# Patient Record
Sex: Female | Born: 1965 | Race: Black or African American | Hispanic: No | Smoking: Never smoker
Health system: Southern US, Community
[De-identification: ages and names within clinical notes are randomized; demographics above are authoritative.]

## PROBLEM LIST (undated history)

## (undated) DIAGNOSIS — I10 Essential (primary) hypertension: Secondary | ICD-10-CM

## (undated) DIAGNOSIS — R7301 Impaired fasting glucose: Secondary | ICD-10-CM

## (undated) DIAGNOSIS — Z862 Personal history of diseases of the blood and blood-forming organs and certain disorders involving the immune mechanism: Secondary | ICD-10-CM

## (undated) DIAGNOSIS — I639 Cerebral infarction, unspecified: Secondary | ICD-10-CM

## (undated) DIAGNOSIS — G4730 Sleep apnea, unspecified: Secondary | ICD-10-CM

## (undated) DIAGNOSIS — R51 Headache: Secondary | ICD-10-CM

## (undated) DIAGNOSIS — E785 Hyperlipidemia, unspecified: Secondary | ICD-10-CM

## (undated) DIAGNOSIS — F418 Other specified anxiety disorders: Secondary | ICD-10-CM

## (undated) DIAGNOSIS — K219 Gastro-esophageal reflux disease without esophagitis: Secondary | ICD-10-CM

## (undated) DIAGNOSIS — M1990 Unspecified osteoarthritis, unspecified site: Secondary | ICD-10-CM

## (undated) HISTORY — DX: Impaired fasting glucose: R73.01

## (undated) HISTORY — DX: Cerebral infarction, unspecified: I63.9

## (undated) HISTORY — DX: Hyperlipidemia, unspecified: E78.5

## (undated) HISTORY — DX: Personal history of diseases of the blood and blood-forming organs and certain disorders involving the immune mechanism: Z86.2

## (undated) HISTORY — PX: TUBAL LIGATION: SHX77

## (undated) HISTORY — DX: Gastro-esophageal reflux disease without esophagitis: K21.9

## (undated) HISTORY — PX: WISDOM TOOTH EXTRACTION: SHX21

## (undated) HISTORY — DX: Other specified anxiety disorders: F41.8

---

## 2001-11-07 ENCOUNTER — Encounter: Payer: Self-pay | Admitting: Internal Medicine

## 2001-11-07 ENCOUNTER — Ambulatory Visit (HOSPITAL_COMMUNITY): Admission: RE | Admit: 2001-11-07 | Discharge: 2001-11-07 | Payer: Self-pay | Admitting: Internal Medicine

## 2001-11-14 ENCOUNTER — Ambulatory Visit (HOSPITAL_COMMUNITY): Admission: RE | Admit: 2001-11-14 | Discharge: 2001-11-14 | Payer: Self-pay | Admitting: Internal Medicine

## 2004-08-13 ENCOUNTER — Ambulatory Visit: Payer: Self-pay | Admitting: Family Medicine

## 2004-08-14 ENCOUNTER — Ambulatory Visit (HOSPITAL_COMMUNITY): Admission: RE | Admit: 2004-08-14 | Discharge: 2004-08-14 | Payer: Self-pay | Admitting: Family Medicine

## 2004-08-27 ENCOUNTER — Ambulatory Visit: Payer: Self-pay | Admitting: *Deleted

## 2004-08-31 ENCOUNTER — Ambulatory Visit (HOSPITAL_COMMUNITY): Admission: RE | Admit: 2004-08-31 | Discharge: 2004-08-31 | Payer: Self-pay | Admitting: *Deleted

## 2004-08-31 ENCOUNTER — Ambulatory Visit: Payer: Self-pay | Admitting: *Deleted

## 2005-02-10 ENCOUNTER — Emergency Department (HOSPITAL_COMMUNITY): Admission: EM | Admit: 2005-02-10 | Discharge: 2005-02-10 | Payer: Self-pay | Admitting: Emergency Medicine

## 2005-05-04 ENCOUNTER — Emergency Department (HOSPITAL_COMMUNITY): Admission: EM | Admit: 2005-05-04 | Discharge: 2005-05-05 | Payer: Self-pay | Admitting: Emergency Medicine

## 2005-11-19 ENCOUNTER — Emergency Department (HOSPITAL_COMMUNITY): Admission: EM | Admit: 2005-11-19 | Discharge: 2005-11-19 | Payer: Self-pay | Admitting: Emergency Medicine

## 2008-04-09 ENCOUNTER — Ambulatory Visit (HOSPITAL_COMMUNITY): Admission: RE | Admit: 2008-04-09 | Discharge: 2008-04-09 | Payer: Self-pay | Admitting: Family Medicine

## 2008-06-04 ENCOUNTER — Other Ambulatory Visit: Admission: RE | Admit: 2008-06-04 | Discharge: 2008-06-04 | Payer: Self-pay | Admitting: Nurse Practitioner

## 2008-06-05 ENCOUNTER — Other Ambulatory Visit: Admission: RE | Admit: 2008-06-05 | Discharge: 2008-06-05 | Payer: Self-pay | Admitting: Unknown Physician Specialty

## 2009-01-26 ENCOUNTER — Emergency Department (HOSPITAL_COMMUNITY): Admission: EM | Admit: 2009-01-26 | Discharge: 2009-01-26 | Payer: Self-pay | Admitting: Emergency Medicine

## 2009-06-26 ENCOUNTER — Encounter: Payer: Self-pay | Admitting: Family Medicine

## 2009-06-30 ENCOUNTER — Ambulatory Visit (HOSPITAL_COMMUNITY): Admission: RE | Admit: 2009-06-30 | Discharge: 2009-06-30 | Payer: Self-pay | Admitting: Family Medicine

## 2009-08-25 ENCOUNTER — Other Ambulatory Visit: Payer: Self-pay

## 2009-08-25 ENCOUNTER — Ambulatory Visit: Payer: Self-pay | Admitting: Psychiatry

## 2009-08-25 ENCOUNTER — Inpatient Hospital Stay (HOSPITAL_COMMUNITY): Admission: RE | Admit: 2009-08-25 | Discharge: 2009-08-26 | Payer: Self-pay | Admitting: Psychiatry

## 2009-09-18 ENCOUNTER — Other Ambulatory Visit: Admission: RE | Admit: 2009-09-18 | Discharge: 2009-09-18 | Payer: Self-pay | Admitting: Obstetrics and Gynecology

## 2009-09-26 ENCOUNTER — Ambulatory Visit (HOSPITAL_COMMUNITY): Admission: RE | Admit: 2009-09-26 | Discharge: 2009-09-26 | Payer: Self-pay | Admitting: Obstetrics and Gynecology

## 2009-11-05 ENCOUNTER — Ambulatory Visit: Payer: Self-pay | Admitting: Rheumatology

## 2009-12-21 HISTORY — PX: ESOPHAGOGASTRODUODENOSCOPY: SHX1529

## 2009-12-31 ENCOUNTER — Ambulatory Visit: Payer: Self-pay | Admitting: Gastroenterology

## 2010-09-22 NOTE — Letter (Signed)
Summary: Historic Patient File  Historic Patient File   Imported By: Lind Guest 06/26/2009 09:15:38  _____________________________________________________________________  External Attachment:    Type:   Image     Comment:   External Document

## 2010-10-20 ENCOUNTER — Emergency Department (HOSPITAL_COMMUNITY): Payer: Medicare Other

## 2010-10-20 ENCOUNTER — Emergency Department (HOSPITAL_COMMUNITY)
Admission: EM | Admit: 2010-10-20 | Discharge: 2010-10-20 | Disposition: A | Discharge status: Home / Self Care | Payer: Medicare Other | Attending: Emergency Medicine | Admitting: Emergency Medicine

## 2010-10-20 DIAGNOSIS — M79609 Pain in unspecified limb: Secondary | ICD-10-CM | POA: Insufficient documentation

## 2010-10-20 DIAGNOSIS — X500XXA Overexertion from strenuous movement or load, initial encounter: Secondary | ICD-10-CM | POA: Insufficient documentation

## 2010-10-20 DIAGNOSIS — S93409A Sprain of unspecified ligament of unspecified ankle, initial encounter: Secondary | ICD-10-CM | POA: Insufficient documentation

## 2010-11-08 LAB — URINALYSIS, ROUTINE W REFLEX MICROSCOPIC
Bilirubin Urine: NEGATIVE
Glucose, UA: NEGATIVE mg/dL
Leukocytes, UA: NEGATIVE
Nitrite: NEGATIVE
Protein, ur: NEGATIVE mg/dL
Specific Gravity, Urine: 1.025 (ref 1.005–1.030)
Urobilinogen, UA: 0.2 mg/dL (ref 0.0–1.0)
pH: 6 (ref 5.0–8.0)

## 2010-11-08 LAB — GLUCOSE, CAPILLARY: Glucose-Capillary: 104 mg/dL — ABNORMAL HIGH (ref 70–99)

## 2010-11-08 LAB — CBC
HCT: 38.3 % (ref 36.0–46.0)
Hemoglobin: 12.3 g/dL (ref 12.0–15.0)
MCHC: 32.2 g/dL (ref 30.0–36.0)
MCV: 76.1 fL — ABNORMAL LOW (ref 78.0–100.0)
Platelets: 200 10*3/uL (ref 150–400)
RBC: 5.03 MIL/uL (ref 3.87–5.11)
RDW: 19.1 % — ABNORMAL HIGH (ref 11.5–15.5)
WBC: 7.4 10*3/uL (ref 4.0–10.5)

## 2010-11-08 LAB — RAPID URINE DRUG SCREEN, HOSP PERFORMED
Amphetamines: NOT DETECTED
Barbiturates: NOT DETECTED
Benzodiazepines: NOT DETECTED
Cocaine: NOT DETECTED
Opiates: NOT DETECTED
Tetrahydrocannabinol: NOT DETECTED

## 2010-11-08 LAB — BASIC METABOLIC PANEL
BUN: 13 mg/dL (ref 6–23)
CO2: 24 mEq/L (ref 19–32)
Calcium: 9.7 mg/dL (ref 8.4–10.5)
Chloride: 103 mEq/L (ref 96–112)
Creatinine, Ser: 0.97 mg/dL (ref 0.4–1.2)
GFR calc Af Amer: >60 mL/min (ref >60)
GFR calc non Af Amer: >60 mL/min (ref >60)
Glucose, Bld: 97 mg/dL (ref 70–99)
Potassium: 4 mEq/L (ref 3.5–5.1)
Sodium: 139 mEq/L (ref 135–145)

## 2010-11-08 LAB — DIFFERENTIAL
Basophils Absolute: 0 10*3/uL (ref 0.0–0.1)
Basophils Relative: 1 % (ref 0–1)
Eosinophils Absolute: 0.1 10*3/uL (ref 0.0–0.7)
Eosinophils Relative: 1 % (ref 0–5)
Lymphocytes Relative: 30 % (ref 12–46)
Lymphs Abs: 2.2 10*3/uL (ref 0.7–4.0)
Monocytes Absolute: 0.3 10*3/uL (ref 0.1–1.0)
Monocytes Relative: 5 % (ref 3–12)
Neutro Abs: 4.7 10*3/uL (ref 1.7–7.7)
Neutrophils Relative %: 64 % (ref 43–77)

## 2010-11-08 LAB — ETHANOL: Alcohol, Ethyl (B): <5 mg/dL (ref 0–10)

## 2010-11-08 LAB — URINE MICROSCOPIC-ADD ON

## 2010-11-08 LAB — PREGNANCY, URINE: Preg Test, Ur: NEGATIVE

## 2011-01-08 NOTE — Procedures (Signed)
[facility]  Patient:    ISSABELA, LESKO Visit Number: [id number] MRN: [id number]          Service Type: OUT Location: RAD Attending Physician:  Avon Gully Dictated by:   Kari Baars, M.D. Proc. Date: 11/14/01 Admit Date:  11/07/2001 Discharge Date: 11/07/2001                      Pulmonary Function Test Inter.  RESULTS: 1. Spirometry shows a mild ventilatory defect with air flow obstruction seen    at the level of the small airways. 2. Lung volumes show restrictive change and air trapping.  The restrictive    change is at approximately the same degree as the ventilatory defect seen    on spirometry. 3. DLCO is severely reduced. 4. There is no significant bronchodilator response.  Note: The patient is listed as being 62 inches, and 245 pounds, and this could produce at least part, if not all of the restrictive change seen on the lung volumes. Dictated by:   Kari Baars, M.D. Attending Physician:  Avon Gully DD:  11/14/01 TD:  11/15/01 Job: [id number]

## 2011-01-08 NOTE — Procedures (Signed)
NAME:  ZAN, TRISKA NO.:  [id number]   MEDICAL RECORD NO.:  [id number]          PATIENT TYPE:  OUT   LOCATION:  RAD                           FACILITY:  [facility]   PHYSICIAN:  [place] Bing, M.D.  DATE OF BIRTH:  1965/09/23   DATE OF PROCEDURE:  08/31/2004  DATE OF DISCHARGE:                                  ECHOCARDIOGRAM   REFERRING PHYSICIAN:  Milus Mallick. Lodema Hong, M.D. and Vida Roller, M.D.   CLINICAL DATA:  A 45 year old woman with chest pain, history of CVA and  hypertension.   M-MODE:  Aorta 2.5, left atrium 4.2, septum 1.2, posterior wall, 1.2, LV  diastole 3.8, LV systole 2.5.   1.  Technically adequate echocardiographic study.  2.  Normal left atrium, right atrium, and right ventricle.  3.  Mitral valve leaflet thickness at the upper limit of normal; minimal      mitral regurgitation.  4.  Normal tricuspid and pulmonic valve; slight tricuspid regurgitation.  5.  Mild aortic valve sclerosis; trileaflet structure with normal function.  6.  Normal internal dimension, wall thickness, regional and global function      of the left ventricle.     [place]   RR/MEDQ  D:  08/31/2004  T:  08/31/2004  Job:  [id number]   cc:   Milus Mallick. Lodema Hong, M.D.  [address]  [place], [place] [address]  Fax: [phone]   Vida Roller, M.D.  Fax: [phone]

## 2011-08-20 ENCOUNTER — Other Ambulatory Visit: Payer: Self-pay | Admitting: Obstetrics and Gynecology

## 2011-08-20 DIAGNOSIS — R928 Other abnormal and inconclusive findings on diagnostic imaging of breast: Secondary | ICD-10-CM

## 2011-09-24 HISTORY — PX: ENDOMETRIAL ABLATION: SHX621

## 2011-09-28 ENCOUNTER — Ambulatory Visit
Admission: RE | Admit: 2011-09-28 | Discharge: 2011-09-28 | Disposition: A | Discharge status: Home / Self Care | Payer: Medicare Other | Source: Ambulatory Visit | Attending: Obstetrics and Gynecology | Admitting: Obstetrics and Gynecology

## 2011-09-28 DIAGNOSIS — R928 Other abnormal and inconclusive findings on diagnostic imaging of breast: Secondary | ICD-10-CM

## 2011-10-06 ENCOUNTER — Encounter (HOSPITAL_COMMUNITY): Payer: Self-pay | Admitting: Pharmacist

## 2011-10-08 ENCOUNTER — Encounter (HOSPITAL_COMMUNITY)
Admission: RE | Admit: 2011-10-08 | Discharge: 2011-10-08 | Disposition: A | Discharge status: Home / Self Care | Payer: Medicare Other | Source: Ambulatory Visit | Attending: Obstetrics and Gynecology | Admitting: Obstetrics and Gynecology

## 2011-10-08 ENCOUNTER — Other Ambulatory Visit: Payer: Self-pay

## 2011-10-08 ENCOUNTER — Encounter (HOSPITAL_COMMUNITY): Payer: Self-pay

## 2011-10-08 ENCOUNTER — Inpatient Hospital Stay (HOSPITAL_COMMUNITY)
Admission: AD | Admit: 2011-10-08 | Discharge: 2011-10-08 | Disposition: A | Discharge status: Home / Self Care | Payer: Medicare Other | Source: Ambulatory Visit | Attending: Obstetrics and Gynecology | Admitting: Obstetrics and Gynecology

## 2011-10-08 DIAGNOSIS — R238 Other skin changes: Secondary | ICD-10-CM

## 2011-10-08 DIAGNOSIS — L988 Other specified disorders of the skin and subcutaneous tissue: Secondary | ICD-10-CM

## 2011-10-08 DIAGNOSIS — A6000 Herpesviral infection of urogenital system, unspecified: Secondary | ICD-10-CM | POA: Insufficient documentation

## 2011-10-08 HISTORY — DX: Headache: R51

## 2011-10-08 HISTORY — DX: Cerebral infarction, unspecified: I63.9

## 2011-10-08 HISTORY — DX: Essential (primary) hypertension: I10

## 2011-10-08 HISTORY — DX: Sleep apnea, unspecified: G47.30

## 2011-10-08 HISTORY — DX: Unspecified osteoarthritis, unspecified site: M19.90

## 2011-10-08 LAB — CBC
HCT: 41 % (ref 36.0–46.0)
Hemoglobin: 13 g/dL (ref 12.0–15.0)
MCH: 25.1 pg — ABNORMAL LOW (ref 26.0–34.0)
MCHC: 31.7 g/dL (ref 30.0–36.0)
MCV: 79.2 fL (ref 78.0–100.0)
Platelets: 190 10*3/uL (ref 150–400)
RBC: 5.18 MIL/uL — ABNORMAL HIGH (ref 3.87–5.11)
RDW: 16 % — ABNORMAL HIGH (ref 11.5–15.5)
WBC: 7.3 10*3/uL (ref 4.0–10.5)

## 2011-10-08 LAB — BASIC METABOLIC PANEL
BUN: 13 mg/dL (ref 6–23)
CO2: 26 mEq/L (ref 19–32)
Calcium: 9.3 mg/dL (ref 8.4–10.5)
Chloride: 104 mEq/L (ref 96–112)
Creatinine, Ser: 0.99 mg/dL (ref 0.50–1.10)
GFR calc Af Amer: 77 mL/min — ABNORMAL LOW (ref >90)
GFR calc non Af Amer: 66 mL/min — ABNORMAL LOW (ref >90)
Glucose, Bld: 169 mg/dL — ABNORMAL HIGH (ref 70–99)
Potassium: 3.6 mEq/L (ref 3.5–5.1)
Sodium: 139 mEq/L (ref 135–145)

## 2011-10-08 MED ORDER — ACYCLOVIR 400 MG PO TABS: 200.0000 mg | ORAL_TABLET | Freq: Two times a day (BID) | ORAL | Status: AC

## 2011-10-08 MED ORDER — ACYCLOVIR 400 MG PO TABS: 200.0000 mg | ORAL_TABLET | Freq: Two times a day (BID) | ORAL | Status: DC

## 2011-10-08 MED ORDER — VALACYCLOVIR HCL 1 G PO TABS: 1000.0000 mg | ORAL_TABLET | Freq: Two times a day (BID) | ORAL | Status: AC

## 2011-10-08 NOTE — Patient Instructions (Addendum)
   Your procedure is scheduled on: Wednesday, Feb 27th  Enter through the [employer] [facility] at: 6:00am Pick up the phone at the desk and dial [phone] and inform us of your arrival.  Please call this number if you have any problems the morning of surgery: [phone]  Remember: Do not eat food after midnight: Tuesday Do not drink clear liquids after: Tuesday Take these medicines the morning of surgery with a SIP OF WATER: Norvasc and Bring Albuterol Inhaler  Do not wear jewelry, make-up, or FINGER nail polish Do not wear lotions, powders, perfumes or deodorant. Do not shave 48 hours prior to surgery. Do not bring valuables to the hospital.  Patients discharged on the day of surgery will not be allowed to drive home.   Home with Daughter Gari Crown  cell [phone]   Remember to use your hibiclens as instructed.Please shower with 1/2 bottle the evening before your surgery and the other 1/2 bottle the morning of surgery.

## 2011-10-08 NOTE — Pre-Procedure Instructions (Signed)
Reviewed patient's medical history and meds with Dr Malen Gauze.  EKG on chart.

## 2011-10-08 NOTE — ED Provider Notes (Signed)
History     Chief Complaint  Patient presents with  . Rash   HPI  Pt was seen today for pre-op surgery and was found to have a rash on her right buttock that they wanted pt to be seen for evaluation.  The pt says she has had this rash for years and it always comes before her period.  She has itching prior to onset of rash.  Past Medical History  Diagnosis Date  . Hypertension   . Asthma   . Stroke 1984 , 1988    X 2 W/LEFT SIDED WEAKNESS  . Shortness of breath   . Sleep apnea     Does not use CPAP - no longer has CPAP machine  . Anemia   . Blood transfusion 1998 or [address] - unsure of # of units  . History of esophageal reflux     no meds  . Headache     OTC med PRN  . Arthritis     back pain and knee pain, no meds  . Anxiety     History - no meds  . Depression     History- no meds  . H/O dizziness     Past Surgical History  Procedure Date  . Cesarean section 1984, 1987    x 2  . Wisdom tooth extraction   . Tubal ligation     Family History  Problem Relation Age of Onset  . Anesthesia problems Neg Hx     History  Substance Use Topics  . Smoking status: Never Smoker   . Smokeless tobacco: Never Used  . Alcohol Use: Yes     Socially    Allergies: No Known Allergies  Prescriptions prior to admission  Medication Sig Dispense Refill  . albuterol (PROVENTIL HFA;VENTOLIN HFA) 108 (90 BASE) MCG/ACT inhaler Inhale 2 puffs into the lungs every 6 (six) hours as needed. Takes for shortness of breath      . amLODipine (NORVASC) 5 MG tablet Take 5 mg by mouth daily.      Marland Kitchen aspirin 81 MG tablet Take 81 mg by mouth daily.        ROS Physical Exam   Blood pressure 128/84, pulse 99, temperature 97.6 F (36.4 C), temperature source Oral, resp. rate 20, height 5\' 2"  (1.575 m), weight 259 lb (117.482 kg), last menstrual period 09/27/2011.  Physical Exam  Nursing note and vitals reviewed. Constitutional: She is oriented to person, place, and time. She  appears well-developed and well-nourished.  Eyes: Pupils are equal, round, and reactive to light.  Neck: Normal range of motion.  Respiratory: Effort normal.  GI: Soft.  Musculoskeletal: Normal range of motion.  Neurological: She is alert and oriented to person, place, and time.  Skin: Skin is warm and dry. Rash noted.       Right buttock vesicular oval  rash ~3cm with erythema around borders.  Viral culture and MRSA culture obtained    MAU Course  Procedures Viral HSV culture and MRSA cultures obtained Discussed with Dr. 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

## 2011-10-08 NOTE — Pre-Procedure Instructions (Signed)
Informed Eber Jones in Lab at [facility] of patient's history of a blood transfusion.

## 2011-10-08 NOTE — Progress Notes (Signed)
Being seen by another dr, here for pre-op.  Has a rash, comes up every time she has cycle, above left buttock/sacral area.   Was told to come get it checked out.

## 2011-10-11 LAB — MRSA CULTURE

## 2011-10-11 LAB — HERPES SIMPLEX VIRUS CULTURE: Culture: DETECTED

## 2011-10-19 MED ORDER — CEFAZOLIN SODIUM-DEXTROSE 2-3 GM-% IV SOLR
2.0000 g | INTRAVENOUS | Status: AC
  Administered 2011-10-20: 2 g via INTRAVENOUS
  Filled 2011-10-19: qty 50

## 2011-10-19 NOTE — H&P (Signed)
Gwendolyn Woodward is an 46 y.o. female S/P BTL with Mirena IUD in place to control heavy menses. Has spotting/bleeding almost every day. U/S shows IUD in proper location and changes C/W adenomyosis of uterus.  Pertinent Gynecological History: Menses: daily Bleeding: intermenstrual bleeding Contraception: tubal ligation DES exposure: unknown Blood transfusions: unknown # units Sexually transmitted diseases: trichomonas treated Previous GYN Procedures: unknown  Last mammogram: normal Date: 2012 Last pap: normal Date: 2012 OB History: G2, P2   Menstrual History: Menarche age: unknown No LMP recorded.    Past Medical History  Diagnosis Date  . Hypertension   . Asthma   . Stroke 1984 , 1988    X 2 W/LEFT SIDED WEAKNESS  . Shortness of breath   . Sleep apnea     Does not use CPAP - no longer has CPAP machine  . Anemia   . Blood transfusion 1998 or [address] - unsure of # of units  . History of esophageal reflux     no meds  . Headache     OTC med PRN  . Arthritis     back pain and knee pain, no meds  . Anxiety     History - no meds  . Depression     History- no meds  . H/O dizziness     Past Surgical History  Procedure Date  . Cesarean section 1984, 1987    x 2  . Wisdom tooth extraction   . Tubal ligation     Family History  Problem Relation Age of Onset  . Anesthesia problems Neg Hx     Social History:  reports that she has never smoked. She has never used smokeless tobacco. She reports that she drinks alcohol. She reports that she does not use illicit drugs.  Allergies: No Known Allergies  No prescriptions prior to admission    Review of Systems  Constitutional: Negative for fever and chills.  Gastrointestinal: Negative for abdominal pain.    There were no vitals taken for this visit. Physical Exam  Cardiovascular: Normal rate and regular rhythm.   Respiratory: Effort normal and breath sounds normal.  GI: Soft. Bowel sounds are normal.  There is no tenderness.    No results found for this or any previous visit (from the past 24 hour(s)).  No results found.  Assessment/Plan: 46 yo with daily spotting on Mirena IUD D/W pt H/S, D&C, EMA-risks/benefits   Shashank Kwasnik II,Gwendolyn Woodward E 10/19/2011, 5:58 PM

## 2011-10-20 ENCOUNTER — Ambulatory Visit (HOSPITAL_COMMUNITY)
Admission: RE | Admit: 2011-10-20 | Discharge: 2011-10-20 | Disposition: A | Discharge status: Home / Self Care | Payer: Medicare Other | Source: Ambulatory Visit | Attending: Obstetrics and Gynecology | Admitting: Obstetrics and Gynecology

## 2011-10-20 ENCOUNTER — Encounter (HOSPITAL_COMMUNITY): Payer: Self-pay | Admitting: Anesthesiology

## 2011-10-20 ENCOUNTER — Encounter (HOSPITAL_COMMUNITY): Payer: Self-pay | Admitting: *Deleted

## 2011-10-20 ENCOUNTER — Ambulatory Visit (HOSPITAL_COMMUNITY): Payer: Medicare Other | Admitting: Anesthesiology

## 2011-10-20 ENCOUNTER — Encounter (HOSPITAL_COMMUNITY)
Admission: RE | Disposition: A | Discharge status: Home / Self Care | Payer: Self-pay | Source: Ambulatory Visit | Attending: Obstetrics and Gynecology

## 2011-10-20 DIAGNOSIS — Z30432 Encounter for removal of intrauterine contraceptive device: Secondary | ICD-10-CM | POA: Insufficient documentation

## 2011-10-20 DIAGNOSIS — Z01818 Encounter for other preprocedural examination: Secondary | ICD-10-CM | POA: Insufficient documentation

## 2011-10-20 DIAGNOSIS — N920 Excessive and frequent menstruation with regular cycle: Secondary | ICD-10-CM | POA: Insufficient documentation

## 2011-10-20 DIAGNOSIS — Z01812 Encounter for preprocedural laboratory examination: Secondary | ICD-10-CM | POA: Insufficient documentation

## 2011-10-20 HISTORY — PX: IUD REMOVAL: SHX5392

## 2011-10-20 LAB — COMPREHENSIVE METABOLIC PANEL
ALT: 15 U/L (ref 0–35)
AST: 18 U/L (ref 0–37)
Albumin: 3.4 g/dL — ABNORMAL LOW (ref 3.5–5.2)
Alkaline Phosphatase: 97 U/L (ref 39–117)
BUN: 15 mg/dL (ref 6–23)
CO2: 28 mEq/L (ref 19–32)
Calcium: 9.2 mg/dL (ref 8.4–10.5)
Chloride: 103 mEq/L (ref 96–112)
Creatinine, Ser: 1.06 mg/dL (ref 0.50–1.10)
GFR calc Af Amer: 71 mL/min — ABNORMAL LOW (ref >90)
GFR calc non Af Amer: 61 mL/min — ABNORMAL LOW (ref >90)
Glucose, Bld: 109 mg/dL — ABNORMAL HIGH (ref 70–99)
Potassium: 4.1 mEq/L (ref 3.5–5.1)
Sodium: 139 mEq/L (ref 135–145)
Total Bilirubin: 0.3 mg/dL (ref 0.3–1.2)
Total Protein: 7.3 g/dL (ref 6.0–8.3)

## 2011-10-20 LAB — PROTIME-INR
INR: 0.95 (ref 0.00–1.49)
Prothrombin Time: 12.9 seconds (ref 11.6–15.2)

## 2011-10-20 SURGERY — DILATATION & CURETTAGE/HYSTEROSCOPY WITH NOVASURE ABLATION
Anesthesia: General | Site: Uterus | Wound class: Clean Contaminated

## 2011-10-20 MED ORDER — FENTANYL CITRATE 0.05 MG/ML IJ SOLN
INTRAMUSCULAR | Status: DC | PRN
  Administered 2011-10-20 (×2): 50 ug via INTRAVENOUS

## 2011-10-20 MED ORDER — GLYCOPYRROLATE 0.2 MG/ML IJ SOLN
INTRAMUSCULAR | Status: DC | PRN
  Administered 2011-10-20: 0.1 mg via INTRAVENOUS

## 2011-10-20 MED ORDER — PROPOFOL 10 MG/ML IV EMUL
INTRAVENOUS | Status: AC
  Filled 2011-10-20: qty 20

## 2011-10-20 MED ORDER — DEXAMETHASONE SODIUM PHOSPHATE 4 MG/ML IJ SOLN: 8.0000 mg | Freq: Once | INTRAMUSCULAR | Status: DC | PRN

## 2011-10-20 MED ORDER — FENTANYL CITRATE 0.05 MG/ML IJ SOLN
25.0000 ug | INTRAMUSCULAR | Status: DC | PRN
  Administered 2011-10-20: 50 ug via INTRAVENOUS

## 2011-10-20 MED ORDER — ONDANSETRON HCL 4 MG/2ML IJ SOLN
INTRAMUSCULAR | Status: AC
  Filled 2011-10-20: qty 2

## 2011-10-20 MED ORDER — LIDOCAINE HCL 1 % IJ SOLN
INTRAMUSCULAR | Status: DC | PRN
  Administered 2011-10-20: 20 mL

## 2011-10-20 MED ORDER — FENTANYL CITRATE 0.05 MG/ML IJ SOLN
INTRAMUSCULAR | Status: AC
  Administered 2011-10-20: 50 ug via INTRAVENOUS
  Filled 2011-10-20: qty 2

## 2011-10-20 MED ORDER — MIDAZOLAM HCL 2 MG/2ML IJ SOLN
INTRAMUSCULAR | Status: AC
  Filled 2011-10-20: qty 2

## 2011-10-20 MED ORDER — PROPOFOL 10 MG/ML IV EMUL
INTRAVENOUS | Status: DC | PRN
  Administered 2011-10-20: 200 mg via INTRAVENOUS

## 2011-10-20 MED ORDER — DEXAMETHASONE SODIUM PHOSPHATE 10 MG/ML IJ SOLN
INTRAMUSCULAR | Status: AC
  Filled 2011-10-20: qty 1

## 2011-10-20 MED ORDER — KETOROLAC TROMETHAMINE 60 MG/2ML IM SOLN
INTRAMUSCULAR | Status: AC
  Filled 2011-10-20: qty 2

## 2011-10-20 MED ORDER — LACTATED RINGERS IV SOLN
INTRAVENOUS | Status: DC
  Administered 2011-10-20: 07:00:00 via INTRAVENOUS
  Administered 2011-10-20: 125 mL/h via INTRAVENOUS

## 2011-10-20 MED ORDER — MEPERIDINE HCL 25 MG/ML IJ SOLN: 6.2500 mg | INTRAMUSCULAR | Status: DC | PRN

## 2011-10-20 MED ORDER — LIDOCAINE HCL (CARDIAC) 20 MG/ML IV SOLN
INTRAVENOUS | Status: AC
  Filled 2011-10-20: qty 5

## 2011-10-20 MED ORDER — DEXAMETHASONE SODIUM PHOSPHATE 4 MG/ML IJ SOLN
INTRAMUSCULAR | Status: DC | PRN
  Administered 2011-10-20: 10 mg via INTRAVENOUS

## 2011-10-20 MED ORDER — FENTANYL CITRATE 0.05 MG/ML IJ SOLN
25.0000 ug | INTRAMUSCULAR | Status: DC | PRN
  Administered 2011-10-20 (×3): 50 ug via INTRAVENOUS

## 2011-10-20 MED ORDER — MIDAZOLAM HCL 5 MG/5ML IJ SOLN
INTRAMUSCULAR | Status: DC | PRN
  Administered 2011-10-20: 2 mg via INTRAVENOUS

## 2011-10-20 MED ORDER — KETOROLAC TROMETHAMINE 30 MG/ML IJ SOLN
INTRAMUSCULAR | Status: DC | PRN
  Administered 2011-10-20: 30 mg via INTRAVENOUS

## 2011-10-20 MED ORDER — LIDOCAINE HCL (CARDIAC) 20 MG/ML IV SOLN
INTRAVENOUS | Status: DC | PRN
  Administered 2011-10-20: 80 mg via INTRAVENOUS

## 2011-10-20 MED ORDER — KETOROLAC TROMETHAMINE 30 MG/ML IJ SOLN: 15.0000 mg | Freq: Once | INTRAMUSCULAR | Status: DC | PRN

## 2011-10-20 MED ORDER — LACTATED RINGERS IV SOLN
INTRAVENOUS | Status: DC | PRN
  Administered 2011-10-20: 1 via INTRAVENOUS

## 2011-10-20 MED ORDER — ONDANSETRON HCL 4 MG/2ML IJ SOLN
INTRAMUSCULAR | Status: DC | PRN
  Administered 2011-10-20: 4 mg via INTRAVENOUS

## 2011-10-20 MED ORDER — GLYCOPYRROLATE 0.2 MG/ML IJ SOLN
INTRAMUSCULAR | Status: AC
  Filled 2011-10-20: qty 1

## 2011-10-20 MED ORDER — KETOROLAC TROMETHAMINE 60 MG/2ML IM SOLN
INTRAMUSCULAR | Status: DC | PRN
  Administered 2011-10-20: 30 mg via INTRAMUSCULAR

## 2011-10-20 MED ORDER — ACETAMINOPHEN 160 MG/5ML PO SOLN
1000.0000 mg | Freq: Once | ORAL | Status: AC
  Administered 2011-10-20: 1000 mg via ORAL
  Filled 2011-10-20: qty 40.6

## 2011-10-20 MED ORDER — FENTANYL CITRATE 0.05 MG/ML IJ SOLN
INTRAMUSCULAR | Status: AC
  Filled 2011-10-20: qty 2

## 2011-10-20 SURGICAL SUPPLY — 16 items
ABLATOR ENDOMETRIAL BIPOLAR (ABLATOR) ×2 IMPLANT
CATH ROBINSON RED A/P 16FR (CATHETERS) ×2 IMPLANT
CLOTH BEACON ORANGE TIMEOUT ST (SAFETY) ×2 IMPLANT
CONTAINER PREFILL 10% NBF 60ML (FORM) ×4 IMPLANT
DRESSING TELFA 8X3 (GAUZE/BANDAGES/DRESSINGS) ×2 IMPLANT
GLOVE BIO SURGEON STRL SZ8 (GLOVE) ×4 IMPLANT
GOWN PREVENTION PLUS LG XLONG (DISPOSABLE) ×2 IMPLANT
GOWN STRL REIN XL XLG (GOWN DISPOSABLE) ×3 IMPLANT
NDL SPNL 22GX3.5 QUINCKE BK (NEEDLE) ×1 IMPLANT
NEEDLE SPNL 22GX3.5 QUINCKE BK (NEEDLE) ×2 IMPLANT
PACK HYSTEROSCOPY LF (CUSTOM PROCEDURE TRAY) ×2 IMPLANT
PACK VAGINAL MINOR WOMEN LF (CUSTOM PROCEDURE TRAY) ×2 IMPLANT
PAD PREP 24X48 CUFFED NSTRL (MISCELLANEOUS) ×2 IMPLANT
SYR CONTROL 10ML LL (SYRINGE) ×2 IMPLANT
TOWEL OR 17X24 6PK STRL BLUE (TOWEL DISPOSABLE) ×4 IMPLANT
WATER STERILE IRR 1000ML POUR (IV SOLUTION) ×2 IMPLANT

## 2011-10-20 NOTE — Anesthesia Procedure Notes (Signed)
Procedure Name: LMA Insertion Date/Time: 10/20/2011 7:38 AM Performed by: Karleen Dolphin Pre-anesthesia Checklist: Patient identified, Patient being monitored, Emergency Drugs available, Timeout performed and Suction available Patient Re-evaluated:Patient Re-evaluated prior to inductionOxygen Delivery Method: Circle system utilized Preoxygenation: Pre-oxygenation with 100% oxygen Intubation Type: IV induction Ventilation: Mask ventilation with difficulty LMA: LMA with gastric port inserted LMA Size: 4.0 Number of attempts: 1 Airway Equipment and Method: Patient positioned with wedge pillow Placement Confirmation: positive ETCO2 and breath sounds checked- equal and bilateral Tube secured with: Tape Dental Injury: Teeth and Oropharynx as per pre-operative assessment  Difficulty Due To: Difficulty was anticipated

## 2011-10-20 NOTE — Anesthesia Preprocedure Evaluation (Signed)
Anesthesia Evaluation    Airway Mallampati: III TM Distance: >3 FB   Mouth opening: Limited Mouth Opening  Dental   Pulmonary asthma , sleep apnea ,    Pulmonary exam normal       Cardiovascular hypertension, Pt. on medications     Neuro/Psych PSYCHIATRIC DISORDERS Anxiety Depression CVA    GI/Hepatic negative GI ROS, Neg liver ROS,   Endo/Other  Morbid obesity  Renal/GU negative Renal ROS  Genitourinary negative   Musculoskeletal negative musculoskeletal ROS (+)   Abdominal (+) obese,   Peds negative pediatric ROS (+)  Hematology negative hematology ROS (+)   Anesthesia Other Findings   Reproductive/Obstetrics negative OB ROS                           Anesthesia Physical Anesthesia Plan  ASA: III  Anesthesia Plan: General   Post-op Pain Management:    Induction: Intravenous  Airway Management Planned: LMA  Additional Equipment:   Intra-op Plan:   Post-operative Plan:   Informed Consent: I have reviewed the patients History and Physical, chart, labs and discussed the procedure including the risks, benefits and alternatives for the proposed anesthesia with the patient or authorized representative who has indicated his/her understanding and acceptance.     Plan Discussed with: CRNA, Anesthesiologist and Surgeon  Anesthesia Plan Comments: (1. GERD not active this morning 2. Does not use CPAP for OSA  3. Will use inhaler prior to OR)        Anesthesia Quick Evaluation

## 2011-10-20 NOTE — Brief Op Note (Signed)
10/20/2011  8:17 AM  PATIENT:  Gwendolyn Woodward  46 y.o. femalewith heavy menses   PRE-OPERATIVE DIAGNOSIS:  Menorrhagia  POST-OPERATIVE DIAGNOSIS:  Menorrhagia  PROCEDURE:  Procedure(s) (LRB): DILATATION & CURETTAGE/HYSTEROSCOPY WITH NOVASURE ABLATION (N/A) INTRAUTERINE DEVICE (IUD) REMOVAL (N/A)  SURGEON:  Surgeon(s) and Role:    * Leslie Andrea, MD - Primary  PHYSICIAN ASSISTANT:   ASSISTANTS: none   ANESTHESIA:   general  EBL:  Total I/O In: 900 [I.V.:900] Out: -   BLOOD ADMINISTERED:none  DRAINS: none   LOCAL MEDICATIONS USED:  LIDOCAINE   SPECIMEN:  Source of Specimen:  endometrial currettings  DISPOSITION OF SPECIMEN:  PATHOLOGY  COUNTS:  YES  TOURNIQUET:  * No tourniquets in log *  DICTATION: .Other Dictation: Dictation Number [id number]  PLAN OF CARE: Discharge to home after PACU  PATIENT DISPOSITION:  PACU - hemodynamically stable.   Delay start of Pharmacological VTE agent (>24hrs) due to surgical blood loss or risk of bleeding: not applicable

## 2011-10-20 NOTE — Transfer of Care (Signed)
Immediate Anesthesia Transfer of Care Note  Patient: Gwendolyn Woodward  Procedure(s) Performed: Procedure(s) (LRB): DILATATION & CURETTAGE/HYSTEROSCOPY WITH NOVASURE ABLATION (N/A) INTRAUTERINE DEVICE (IUD) REMOVAL (N/A)  Patient Location: PACU  Anesthesia Type: General  Level of Consciousness: awake, alert  and oriented  Airway & Oxygen Therapy: Patient Spontanous Breathing and Patient connected to nasal cannula oxygen  Post-op Assessment: Report given to PACU RN and Post -op Vital signs reviewed and stable  Post vital signs: Reviewed and stable  Complications: No apparent anesthesia complications

## 2011-10-20 NOTE — Anesthesia Postprocedure Evaluation (Signed)
  Anesthesia Post-op Note  Patient: Gwendolyn Woodward  Procedure(s) Performed: Procedure(s) (LRB): DILATATION & CURETTAGE/HYSTEROSCOPY WITH NOVASURE ABLATION (N/A) INTRAUTERINE DEVICE (IUD) REMOVAL (N/A)  Patient is awake and responsive. Pain and nausea are reasonably well controlled. Vital signs are stable and clinically acceptable. Oxygen saturation is clinically acceptable. There are no apparent anesthetic complications at this time. Patient is ready for discharge.

## 2011-10-20 NOTE — Progress Notes (Signed)
No changes to H&P per pt history Reviewed procedure, risks/benefits All questions answered

## 2011-10-20 NOTE — Op Note (Signed)
NAME:  Gwendolyn Woodward, BENCH NO.:  [id number]  MEDICAL RECORD NO.:  [id number]  LOCATION:  [facility]                          FACILITY:  [facility]  PHYSICIAN:  Guy Sandifer. Henderson Cloud, M.D. DATE OF BIRTH:  Mar 02, 1966  DATE OF PROCEDURE:  10/20/2011 DATE OF DISCHARGE:                              OPERATIVE REPORT   PREOPERATIVE DIAGNOSIS:  Menorrhagia.  POSTOPERATIVE DIAGNOSIS:  Menorrhagia.  PROCEDURES: 1. Removal of intrauterine device. 2. Hysteroscopy. 3. Dilatation and curettage. 4. NovaSure endometrial ablation.  SURGEON:  Guy Sandifer. Henderson Cloud, MD.  ANESTHESIA:  General with LMA.  ANESTHESIOLOGIST:  Belva Agee, MD.  ESTIMATE BLOOD LOSS:  Drops.  SPECIMEN:  Endometrial curettings to Pathology.  I AND O'S/DISTENDING MEDIA:  50 mL deficit.  INDICATIONS AND CONSENT: This patient is a 46 year old lady status post tubal ligation, who has had a Mirena IUD in place for 1-2 years.  She has basically daily spotting.  Ultrasound is consistent with adenomyosis with the IUD in the proper location.  After discussion of options, she is being admitted for removal of IUD, hysteroscopy, D and C, and NovaSure endometrial ablation.  Risks and benefits of the endometrial ablation including success and failure rates, recurrence or persistent abnormal bleeding or pelvic pain have been reviewed.  Risks and complications of the surgery have been reviewed as well including, but not limited to infection, uterine perforation, organ damage, bleeding requiring transfusion of blood products with HIV and hepatitis acquisition, DVT, PE, pneumonia, pelvic pain, painful intercourse, laparotomy, laparoscopy.  All questions have been answered and consent is signed on the chart.  FINDINGS:  Both fallopian tube, ostia identified and no abnormal structures were noted.  PROCEDURE IN DETAIL:  The patient was taken to the operating room where she was identified and placed in dorsal supine position and  general anesthesia was induced via LMA.  She was placed in dorsal lithotomy position.  Time-out was undertaken.  She was prepped, bladder straight catheterized, and she is draped in a sterile fashion.  Bivalve speculum was placed in the vagina.  Anterior cervical lip was injected with 1% Xylocaine and grasped with single-tooth tenaculum.  Paracervical block was placed at 2, 4, 5, 7, 8, and 10 o'clock positions with approximately 20 mL of the same solution.  IUD was removed intact.  Uterus sounds to 8 cm and the endocervix sounds to 4 cm.  Cervix was gently progressively dilated.  Diagnostic hysteroscope was placed in the endocervical canal and advanced under direct visualization using distending media.  The above findings were noted.  Curettage was carried out and reinspection with the hysteroscope again reveals no abnormal structures.  The NovaSure device was placed.  Cavity test was passed on the first attempt and ablation carried out for 2 minutes.  The device was removed intact. Reinspection with the hysteroscope reveals good cautery effect throughout the cavity with no evidence of perforation.  Instruments were removed.  All counts correct.  The patient was awakened and taken to the recovery room in stable condition.     Guy Sandifer Henderson Cloud, M.D.     JET/MEDQ  D:  10/20/2011  T:  10/20/2011  Job:  [id number]

## 2011-11-15 ENCOUNTER — Encounter: Payer: Self-pay | Admitting: Family Medicine

## 2011-11-15 ENCOUNTER — Ambulatory Visit (INDEPENDENT_AMBULATORY_CARE_PROVIDER_SITE_OTHER): Payer: Medicare Other | Admitting: Family Medicine

## 2011-11-15 VITALS — BP 132/84 | HR 87 | Resp 18 | Ht 62.5 in | Wt 255.0 lb

## 2011-11-15 DIAGNOSIS — I10 Essential (primary) hypertension: Secondary | ICD-10-CM

## 2011-11-15 DIAGNOSIS — R0789 Other chest pain: Secondary | ICD-10-CM

## 2011-11-15 DIAGNOSIS — R7309 Other abnormal glucose: Secondary | ICD-10-CM

## 2011-11-15 DIAGNOSIS — E669 Obesity, unspecified: Secondary | ICD-10-CM | POA: Insufficient documentation

## 2011-11-15 NOTE — Patient Instructions (Signed)
Get the blood work done- do not eat after midnight - at least 2-3 days before your visit We will discuss at our follow-up appointment Continue your blood pressure medication  F/U in 4 weeks

## 2011-11-15 NOTE — Progress Notes (Signed)
  Subjective:    Patient ID: Gwendolyn Woodward, female    DOB: 1966-05-14, 46 y.o.   MRN: [id number]  HPI Pt here to establish care, previous PCP Dr.Fanta Medication and history reviewed  HTN- taking norvasc without difficulty Asthma- uses proventil as needed, non smoker Side pain- she has recurrent pain in her ride side and well as chest pains on and off. She had and Echo in 2006 which was normal.  H/O CVA- CVAin 1984 and a mini stroke since then but pt can not recall the specific time.   Depression/anxiety- she has hsitory of both, was being seen [facility] but was verbally aggressive with them therefore dismissed, she was last seen at mental health in [place] but has not been there in a while. She has anger issues, therefore keeps to herself and stays home most of the day.  Does not exercise but has equipment at home   Review of Systems  GEN- denies fatigue, fever, weight loss,weakness, recent illness HEENT- denies eye drainage, change in vision, nasal discharge, CVS- +chest pain, palpitations RESP- denies SOB, cough, wheeze ABD- denies N/V, change in stools, abd pain GU- denies dysuria, hematuria, dribbling, incontinence MSK- +joint pain, muscle aches, injury Neuro- denies headache, dizziness, syncope, seizure activity       Objective:   Physical Exam GEN- NAD, alert and oriented x3, obese HEENT- PERRL, EOMI, non injected sclera, pink conjunctiva, MMM, oropharynx clear Neck- Supple, no thyromegaly, no bruit CVS- RRR, no murmur RESP-CTAB ABD-NABS,soft, NT,ND EXT- No edema Pulses- Radial, DP- 2+  EKG- NSR      Assessment & Plan:

## 2011-11-16 NOTE — Assessment & Plan Note (Signed)
Her episodes associated with side pain are very atypical and they have been present for some time now. EKG reassuring labs to be done. Obtain PCP records

## 2011-11-16 NOTE — Assessment & Plan Note (Signed)
Discussed importance of walking and exercise, pt has equipment at home

## 2011-11-16 NOTE — Assessment & Plan Note (Signed)
Obtain A1c.  

## 2011-11-16 NOTE — Assessment & Plan Note (Signed)
Continue current meds, obtain labs

## 2011-11-23 ENCOUNTER — Encounter: Payer: Self-pay | Admitting: Family Medicine

## 2011-12-04 LAB — LIPID PANEL
Cholesterol: 203 mg/dL — ABNORMAL HIGH (ref 0–200)
HDL: 58 mg/dL (ref >39)
LDL Cholesterol: 124 mg/dL — ABNORMAL HIGH (ref 0–99)
Total CHOL/HDL Ratio: 3.5 Ratio
Triglycerides: 106 mg/dL (ref <150)
VLDL: 21 mg/dL (ref 0–40)

## 2011-12-04 LAB — HEMOGLOBIN A1C
Hgb A1c MFr Bld: 6.1 % — ABNORMAL HIGH (ref <5.7)
Mean Plasma Glucose: 128 mg/dL — ABNORMAL HIGH (ref <117)

## 2011-12-13 ENCOUNTER — Ambulatory Visit (INDEPENDENT_AMBULATORY_CARE_PROVIDER_SITE_OTHER): Payer: Medicare Other | Admitting: Family Medicine

## 2011-12-13 ENCOUNTER — Encounter: Payer: Self-pay | Admitting: Family Medicine

## 2011-12-13 VITALS — BP 138/80 | HR 84 | Resp 18 | Ht 62.5 in | Wt 258.1 lb

## 2011-12-13 DIAGNOSIS — I10 Essential (primary) hypertension: Secondary | ICD-10-CM

## 2011-12-13 DIAGNOSIS — R0789 Other chest pain: Secondary | ICD-10-CM

## 2011-12-13 DIAGNOSIS — R1310 Dysphagia, unspecified: Secondary | ICD-10-CM

## 2011-12-13 DIAGNOSIS — E785 Hyperlipidemia, unspecified: Secondary | ICD-10-CM

## 2011-12-13 DIAGNOSIS — R7309 Other abnormal glucose: Secondary | ICD-10-CM

## 2011-12-13 DIAGNOSIS — E669 Obesity, unspecified: Secondary | ICD-10-CM

## 2011-12-13 DIAGNOSIS — R7303 Prediabetes: Secondary | ICD-10-CM

## 2011-12-13 NOTE — Assessment & Plan Note (Signed)
She has recurrent dysphagia will obtain records from [facility] and refer to GI for work-up

## 2011-12-13 NOTE — Assessment & Plan Note (Signed)
Blood pressure at goal, no change to meds 

## 2011-12-13 NOTE — Assessment & Plan Note (Signed)
She continues to have chest pain, with her GI symptoms I wonder if this is more GI/Reflux related. She has this history of CVA as well as htn and glucose intolerance therefore will send for stress testing.

## 2011-12-13 NOTE — Progress Notes (Signed)
  Subjective:    Patient ID: Gwendolyn Woodward, female    DOB: 21-Jun-1966, 46 y.o.   MRN: [id number]  HPI Patient here to followup blood pressure. As well as lab review. She continues to have sharp chest pains that come and go mostly occur at rest however some with exertion. She denies heartburn symptoms. In the past she has had difficulty swallowing and this has returned. She has had a procedure done in [facility] which sounds like dilatation of the esophagus approximately one year ago.   Review of Systems    GEN- denies fatigue, fever, weight loss,weakness, recent illness HEENT- denies eye drainage, change in vision, nasal discharge, CVS- +chest pain, palpitations RESP- denies SOB, cough, wheeze ABD- denies N/V, change in stools, abd pain GU- denies dysuria, hematuria, dribbling, incontinence MSK- +joint pain, muscle aches, injury Neuro- denies headache, dizziness, syncope, seizure activity    Objective:   Physical Exam GEN- NAD, alert and oriented x3, obese HEENT- PERRL, EOMI, non injected sclera, pink conjunctiva, MMM, oropharynx clear CVS- RRR, no murmur RESP-CTAB ABD-NABS,soft, NT,ND EXT- No edema Pulses- Radial, DP- 2+       Assessment & Plan:

## 2011-12-13 NOTE — Assessment & Plan Note (Signed)
Discussed diet  approx 5 minutes spent

## 2011-12-13 NOTE — Assessment & Plan Note (Signed)
No meds needed at this time.

## 2011-12-13 NOTE — Assessment & Plan Note (Signed)
Discussed proper diet and need for weight loss

## 2011-12-13 NOTE — Patient Instructions (Signed)
I will get records from [facility] and send you to get your swallowing looked at I will refer you to a heart doctor for a stress test Work on the juice and junk food Continue walking and any exercise  Avoid fast foods-  Continue your current medications  F/U 3 months

## 2011-12-16 ENCOUNTER — Encounter: Payer: Self-pay | Admitting: Family Medicine

## 2011-12-23 ENCOUNTER — Encounter: Payer: Self-pay | Admitting: Urgent Care

## 2011-12-23 ENCOUNTER — Ambulatory Visit: Payer: Medicare Other | Admitting: Gastroenterology

## 2011-12-23 ENCOUNTER — Ambulatory Visit (INDEPENDENT_AMBULATORY_CARE_PROVIDER_SITE_OTHER): Payer: Medicare Other | Admitting: Urgent Care

## 2011-12-23 DIAGNOSIS — Z1211 Encounter for screening for malignant neoplasm of colon: Secondary | ICD-10-CM

## 2011-12-23 DIAGNOSIS — R0789 Other chest pain: Secondary | ICD-10-CM

## 2011-12-23 DIAGNOSIS — R1310 Dysphagia, unspecified: Secondary | ICD-10-CM

## 2011-12-23 DIAGNOSIS — K219 Gastro-esophageal reflux disease without esophagitis: Secondary | ICD-10-CM

## 2011-12-23 MED ORDER — PEG 3350-KCL-NA BICARB-NACL 420 G PO SOLR: ORAL | Status: AC

## 2011-12-23 MED ORDER — OMEPRAZOLE 20 MG PO CPDR: 20.0000 mg | DELAYED_RELEASE_CAPSULE | Freq: Every day | ORAL | Status: DC

## 2011-12-23 NOTE — Assessment & Plan Note (Signed)
Screening colonoscopy with Dr. Darrick Penna at the same time as EGD.  No significant lower GI complaints. I have discussed risks & benefits which include, but are not limited to, bleeding, infection, perforation & drug reaction.  The patient agrees with this plan & written consent will be obtained.

## 2011-12-23 NOTE — Assessment & Plan Note (Signed)
Gwendolyn Woodward is a pleasant 46 y.o. female with atypical chest pain, GERD and intermittent dysphagia. She will need EGD for further evaluation with possible esophageal dilation if there is evidence of esophageal web, ring, or stricture.  I have discussed risks & benefits which include, but are not limited to, bleeding, infection, perforation & drug reaction.  The patient agrees with this plan & written consent will be obtained.    Begin omeprazole 20 mg daily before first meal of the day Weight loss one to 2 pounds per week until ideal body weight around 140 pounds Be sure to get your blood pressure pills today and start them GERD diet

## 2011-12-23 NOTE — Progress Notes (Signed)
Referring Provider: Salley Scarlet, MD Primary Care Physician:  Milinda Antis, MD, MD Primary Gastroenterologist:  Dr. Darrick Penna  Chief Complaint  Patient presents with  . Dysphagia    HPI:  Gwendolyn Woodward is a 46 y.o. female here as a referral from Dr. Jeanice Lim for intermittent dysphagia for the past 2 years since EGD at [facility].  Feels like saliva & foods get stuck in upper esophagus.  No problems w/ pills or drinking liquids.  C/o chest pains, heartburn, indigestion with episodes a couple days per month.  She is not on PPI. Significant weight gain over past several years but pt cannot quantify.  C/o nausea, without vomiting.  Denies rectal bleeding or melena.  Takes rare dulcolax for constipation.  Never had screening colonoscopy.    Past Medical History  Diagnosis Date  . Hypertension   . Asthma   . Stroke 1984 , 1988    X 2 W/LEFT SIDED WEAKNESS  . Shortness of breath   . Sleep apnea     Does not use CPAP - no longer has CPAP machine  . Anemia   . Blood transfusion 1998 or [address] - unsure of # of units-vaginal bleeding  . History of esophageal reflux     no meds  . Headache     OTC med PRN  . Arthritis     back pain and knee pain, no meds  . Anxiety     History - no meds  . Depression     History- no meds  . H/O dizziness     Past Surgical History  Procedure Date  . Cesarean section 1984, 1987    x 2  . Wisdom tooth extraction   . Tubal ligation   . Iud removal 10/20/2011    Procedure: INTRAUTERINE DEVICE (IUD) REMOVAL;  Surgeon: Leslie Andrea, MD;  Location: [facility];  Service: Gynecology;  Laterality: N/A;  . Endometrial ablation Feb 2013  . Esophagogastroduodenoscopy 5/11    [facility]-Dr Eliott Nine GERD distal esophagus, NEGATIVE bx for Barretts    Current Outpatient Prescriptions  Medication Sig Dispense Refill  . albuterol (PROVENTIL HFA;VENTOLIN HFA) 108 (90 BASE) MCG/ACT inhaler Inhale 2 puffs into the lungs every 6 (six) hours  as needed. Takes for shortness of breath      . amLODipine (NORVASC) 5 MG tablet Take 5 mg by mouth daily.      Marland Kitchen aspirin 81 MG tablet Take 81 mg by mouth daily.        Allergies as of 12/23/2011  . (No Known Allergies)    Family History:There is no known family history of colorectal carcinoma , liver disease, or inflammatory bowel disease.  Problem Relation Age of Onset  . Anesthesia problems Neg Hx   . Hypertension Mother   . Asthma Mother   . Cancer Father     Lung/Throat  . Heart disease Father   . Diabetes Maternal Uncle     History   Social History  . Marital Status: Divorced    Spouse Name: N/A    Number of Children: 2  . Years of Education: N/A   Occupational History  . disabled    Social History Main Topics  . Smoking status: Never Smoker   . Smokeless tobacco: Never Used  . Alcohol Use: Yes     Socially, 2 times per yr  . Drug Use: No  . Sexually Active: Not Currently    Birth Control/ Protection: Surgical  Other Topics Concern  . Not on file   Social History Narrative   Lives alone-2 grown children  Review of Systems: Gen: Denies any fever, chills, sweats, anorexia, fatigue, weakness, malaise, weight loss, and sleep disorder CV: Denies chest pain, angina, palpitations, syncope, orthopnea, PND, peripheral edema, and claudication. Resp: Denies dyspnea at rest, dyspnea with exercise, cough, sputum, wheezing, coughing up blood, and pleurisy. GI: Denies vomiting blood, jaundice, and fecal incontinence. GU : Denies urinary burning, blood in urine, urinary frequency, urinary hesitancy, nocturnal urination, and urinary incontinence. MS: Denies joint pain, limitation of movement, and swelling, stiffness, low back pain, extremity pain. Denies muscle weakness, cramps, atrophy.  Derm: Denies rash, itching, dry skin, hives, moles, warts, or unhealing ulcers.  Psych: Denies depression, anxiety, memory loss, suicidal ideation, hallucinations, paranoia, and  confusion. Heme: Denies bruising, bleeding, and enlarged lymph nodes. Neuro:  Denies any headaches, dizziness, paresthesias. Endo:  Denies any problems with DM, thyroid, adrenal function.  Physical Exam: BP 139/96  Pulse 71  Temp(Src) 97.7 F (36.5 C) (Temporal)  Ht 5' (1.524 m)  Wt 256 lb (116.121 kg)  BMI 50.00 kg/m2 General:   Alert,  Well-developed, obese,  pleasant and cooperative in NAD. Head:  Normocephalic and atraumatic. Eyes:  Sclera clear, no icterus.   Conjunctiva pink. Ears:  Normal auditory acuity. Nose:  No deformity, discharge, or lesions. Mouth:  No deformity or lesions,oropharynx pink & moist. Neck:  Supple; no masses or thyromegaly. Lungs:  Clear throughout to auscultation.   No wheezes, crackles, or rhonchi. No acute distress. Heart:  Regular rate and rhythm; no murmurs, clicks, rubs,  or gallops. Abdomen:  Normal bowel sounds.  No bruits.  Soft, non-tender and non-distended without masses, hepatosplenomegaly or hernias noted.  No guarding or rebound tenderness.   Rectal:  Deferred. Msk:  Symmetrical without gross deformities. Normal posture. Pulses:  Normal pulses noted. Extremities:  No clubbing. 1+ pitting pretibial edema bilaterally Neurologic:  Alert and oriented x4;  grossly normal neurologically. Skin:  Intact without significant lesions or rashes. Lymph Nodes:  No significant cervical adenopathy. Psych:  Alert and cooperative. Normal mood and affect.

## 2011-12-23 NOTE — Assessment & Plan Note (Signed)
See dysphagia. ?

## 2011-12-23 NOTE — Patient Instructions (Signed)
Begin omeprazole 20 mg daily before her first meal of the day Weight loss one to 2 pounds per week until ideal body weight around 140 pounds Be sure to get your blood pressure pills today and start them He will need an EGD (upper endoscopy) and screening colonoscopy with Dr. Darrick Penna. She may decide to stretch or dilate your esophagus at the same time.   Diet for GERD or PUD Nutrition therapy can help ease the discomfort of gastroesophageal reflux disease (GERD) and peptic ulcer disease (PUD).  HOME CARE INSTRUCTIONS   Eat your meals slowly, in a relaxed setting.   Eat 5 to 6 small meals per day.   If a food causes distress, stop eating it for a period of time.  FOODS TO AVOID  Coffee, regular or decaffeinated.   Cola beverages, regular or low calorie.   Tea, regular or decaffeinated.   Pepper.   Cocoa.   High fat foods, including meats.   Butter, margarine, hydrogenated oil (trans fats).   Peppermint or spearmint (if you have GERD).   Fruits and vegetables if not tolerated.   Alcohol.   Nicotine (smoking or chewing). This is one of the most potent stimulants to acid production in the gastrointestinal tract.   Any food that seems to aggravate your condition.  If you have questions regarding your diet, ask your caregiver or a registered dietitian. TIPS  Lying flat may make symptoms worse. Keep the head of your bed raised 6 to 9 inches (15 to 23 cm) by using a foam wedge or blocks under the legs of the bed.   Do not lay down until 3 hours after eating a meal.   Daily physical activity may help reduce symptoms.  MAKE SURE YOU:   Understand these instructions.   Will watch your condition.   Will get help right away if you are not doing well or get worse.  Document Released: 08/09/2005 Document Revised: 07/29/2011 Document Reviewed: 06/25/2011 [facility] Patient Information 2012 [place], [place].

## 2011-12-26 ENCOUNTER — Encounter: Payer: Self-pay | Admitting: Cardiology

## 2011-12-27 ENCOUNTER — Ambulatory Visit (INDEPENDENT_AMBULATORY_CARE_PROVIDER_SITE_OTHER): Payer: Medicare Other | Admitting: Cardiology

## 2011-12-27 ENCOUNTER — Encounter: Payer: Self-pay | Admitting: Cardiology

## 2011-12-27 VITALS — BP 136/87 | HR 84 | Resp 16 | Ht 60.0 in | Wt 256.0 lb

## 2011-12-27 DIAGNOSIS — Z8673 Personal history of transient ischemic attack (TIA), and cerebral infarction without residual deficits: Secondary | ICD-10-CM | POA: Insufficient documentation

## 2011-12-27 DIAGNOSIS — I10 Essential (primary) hypertension: Secondary | ICD-10-CM

## 2011-12-27 DIAGNOSIS — I6350 Cerebral infarction due to unspecified occlusion or stenosis of unspecified cerebral artery: Secondary | ICD-10-CM

## 2011-12-27 DIAGNOSIS — R7301 Impaired fasting glucose: Secondary | ICD-10-CM

## 2011-12-27 DIAGNOSIS — I639 Cerebral infarction, unspecified: Secondary | ICD-10-CM

## 2011-12-27 DIAGNOSIS — R079 Chest pain, unspecified: Secondary | ICD-10-CM

## 2011-12-27 DIAGNOSIS — K219 Gastro-esophageal reflux disease without esophagitis: Secondary | ICD-10-CM

## 2011-12-27 DIAGNOSIS — G4730 Sleep apnea, unspecified: Secondary | ICD-10-CM

## 2011-12-27 MED ORDER — NITROGLYCERIN 0.4 MG SL SUBL: 0.4000 mg | SUBLINGUAL_TABLET | SUBLINGUAL | Status: DC | PRN

## 2011-12-27 MED ORDER — OMEPRAZOLE 20 MG PO CPDR: 20.0000 mg | DELAYED_RELEASE_CAPSULE | Freq: Two times a day (BID) | ORAL | Status: DC

## 2011-12-27 NOTE — Assessment & Plan Note (Addendum)
Records of prior CVA not available in online [facility] records.  CT Scan performed in 08/2004 documents right frontotemporal encephalomalacia.  Carotid ultrasound in 07/2004-minimal plaque.

## 2011-12-27 NOTE — Patient Instructions (Signed)
**Note De-Identified Gunnard Dorrance Obfuscation** Your physician has requested that you have en exercise stress myoview. For further information please visit [email or web address] Please follow instruction sheet, as given.  Your physician has recommended you make the following change in your medication: increase Omeprazole to twice daily and start taking Nitroglycerin 0.4 mg as needed for chest pain  Your physician recommends that you schedule a follow-up appointment in: 2 to 3 weeks

## 2011-12-27 NOTE — Assessment & Plan Note (Signed)
Mild hyperglycemia-with normal A1c would not consider this frank diabetes at present.

## 2011-12-27 NOTE — Assessment & Plan Note (Signed)
Blood pressure appears adequately controlled with current medication.

## 2011-12-27 NOTE — Progress Notes (Signed)
Patient ID: Gwendolyn Woodward, female   DOB: September 14, 1965, 46 y.o.   MRN: [id number]  HPI: Initial cardiology evaluation for this very pleasant woman kindly referred by Dr. Jeanice Lim for evaluation of chest discomfort.  She describes episodic sharp epigastric/lower substernal pain of moderate intensity with associated numbness in the left arm.  There is no associated nausea, diaphoresis nor dyspnea.  Symptoms occur at rest and pass spontaneously after a few minutes.  There is no relationship to meals, exertion, body position or any other factor appreciated by the patient. Chest discomfort may be accompanied by lightheadedness.   She has not previously been evaluated by a cardiologist nor undergone any significant cardiac testing.  Current Outpatient Prescriptions on File Prior to Visit  Medication Sig Dispense Refill  . albuterol (PROVENTIL HFA;VENTOLIN HFA) 108 (90 BASE) MCG/ACT inhaler Inhale 2 puffs into the lungs every 6 (six) hours as needed. Takes for shortness of breath      . amLODipine (NORVASC) 5 MG tablet Take 5 mg by mouth daily.      Marland Kitchen aspirin 81 MG tablet Take 81 mg by mouth daily.      Marland Kitchen omeprazole (PRILOSEC) 20 MG capsule Take 1 capsule (20 mg total) by mouth daily.  31 capsule  2   No Known Allergies    Past Medical History  Diagnosis Date  . Hypertension     Lab 09/2011: Normal CMet except glucose of 109-169 and albumin-3.4; normal BP 09/2011-12/2011  . Asthma   . CVA (cerebral infarction) 1984 , 1988    x2, left sided weakness, history of dizziness  . Sleep apnea     Does not use CPAP - no longer has CPAP machine  . History of anemia     S/p transfusions in 1999 at [facility] after vaginal bleeding; normal CBC and 09/2011  . Gastroesophageal reflux disease     no meds  . Headache     OTC med PRN  . Arthritis     back pain and knee pain, no meds  . Depression with anxiety     History- no meds  . Chest pain     + dyspnea  . Dysphagia   . Hyperlipidemia     lipid profile in 11/2011:  203, 106, 58, 124  . Fasting hyperglycemia     Past Surgical History  Procedure Date  . Cesarean section 1984, 1987    x 2  . Wisdom tooth extraction   . Tubal ligation   . Iud removal 10/20/2011    Procedure: INTRAUTERINE DEVICE (IUD) REMOVAL;  Surgeon: Leslie Andrea, MD;  Location: [facility];  Service: Gynecology;  Laterality: N/A;  . Endometrial ablation Feb 2013  . Esophag[facility]astroduodenoscopy [facility]/11     [facility] GERD distal esophagus, NEGATIVE bx for Barretts    Family History  Problem Relation Age of Onset  . Anesthesia problems Neg Hx   . Hypertension Mother   . Asthma Mother   . Cancer Father     Lung/Throat  . Heart disease Father   . Diabetes Maternal Uncle     History   Social History  . Marital Status: Divorced    Spouse Name: N/A    Number of Children: 2  . Years of Education: N/A   Occupational History  . disabled    Social History Main Topics  . Smoking status: Never Smoker   . Smokeless tobacco: Never Used  . Alcohol Use: Yes     Socially, 2 times per  yr  . Drug Use: No  . Sexually Active: Not Currently    Birth Control/ Protection: Surgical   Other Topics Concern  . Not on file   Social History Narrative   Lives alone-2 grown children    ROS:  Denies orthopnea, PND, palpitations and syncope.  She notes malaise and lack of energy, recent weight gain, some difficulty swallowing, lower bilateral abdominal discomfort, chronic back pain, frequent headaches, double vision, difficulties with balance but no falls, depression, memory loss associated with previous CVA and cold intolerance.   All other systems reviewed and are negative.  PHYSICAL EXAM: BP 136/87  Pulse 84  Resp 16  Ht 5' (1.524 m)  Wt 116.121 kg (256 lb)  BMI 50.00 kg/m2  General-Well-developed; no acute distress Body Habitus-Obese HEENT-[place]/AT; PERRL; EOM intact; conjunctiva and lids nl; bilateral arcus Neck-No JVD; no carotid bruits Endocrine-No  thyromegaly Lungs-Clear lung fields; resonant percussion; normal I-to-E ratio Cardiovascular- normal PMI; normal S1, increased intensity of S2, systolic murmur at the left sternal border Abdomen-BS normal; soft and non-tender without masses or organomegaly Musculoskeletal-No deformities, cyanosis or clubbing Neurologic-Nl cranial nerves; symmetric strength and tone Skin- Warm, no significant lesions Extremities-Nl distal pulses; no edema  EKG:  Abnormal sinus rhythm, nondiagnostic inferolateral Q waves, right ventricular conduction delay, early R wave progression, borderline low voltage, otherwise normal.   ASSESSMENT AND PLAN:  Stow Bing, MD 12/27/2011 1:38 PM

## 2011-12-27 NOTE — Assessment & Plan Note (Signed)
Patient might benefit from a formal sleep consultation to provide supervised CPAP therapy or other treatment for her sleep apnea.

## 2011-12-27 NOTE — Assessment & Plan Note (Addendum)
Chest discomfort is atypical, but associated discomfort in the left arm is of some concern.  We will proceed with a stress echocardiogram and plan to utilize pharmacologic stress if adequate exercise cannot be completed.

## 2011-12-27 NOTE — Progress Notes (Deleted)
**Note De-Identified Gwendolyn Woodward Obfuscation** Name: Gwendolyn Woodward    DOB: May 01, 1966  Age: 46 y.o.  MR#: [id number]       PCP:  Milinda Antis, MD, MD      Insurance: @PAYORNAME @   CC:    Chief Complaint  Patient presents with  . Chest Pain    chest pain  -meds/list    VS BP 136/87  Pulse 84  Resp 16  Ht 5' (1.524 m)  Wt 256 lb (116.121 kg)  BMI 50.00 kg/m2  Weights Current Weight  12/27/11 256 lb (116.121 kg)  12/23/11 256 lb (116.121 kg)  12/13/11 258 lb 1.9 oz (117.082 kg)    Blood Pressure  BP Readings from Last 3 Encounters:  12/27/11 136/87  12/23/11 139/96  12/13/11 138/80     Admit date:  (Not on file) Last encounter with RMR:  12/26/2011   Allergy No Known Allergies  Current Outpatient Prescriptions  Medication Sig Dispense Refill  . albuterol (PROVENTIL HFA;VENTOLIN HFA) 108 (90 BASE) MCG/ACT inhaler Inhale 2 puffs into the lungs every 6 (six) hours as needed. Takes for shortness of breath      . amLODipine (NORVASC) 5 MG tablet Take 5 mg by mouth daily.      Marland Kitchen aspirin 81 MG tablet Take 81 mg by mouth daily.      Marland Kitchen omeprazole (PRILOSEC) 20 MG capsule Take 1 capsule (20 mg total) by mouth daily.  31 capsule  2    Discontinued Meds:   There are no discontinued medications.  Patient Active Problem List  Diagnoses  . Obesity  . Fasting hyperglycemia  . Chest pain  . Gastroesophageal reflux disease  . Sleep apnea  . Hypertension  . CVA (cerebral infarction)    LABS Office Visit on 11/15/2011  Component Date Value  . Hemoglobin A1C 12/03/2011 6.1*  . Mean Plasma Glucose 12/03/2011 128*  . Cholesterol 12/03/2011 203*  . Triglycerides 12/03/2011 106   . HDL 12/03/2011 58   . Total CHOL/HDL Ratio 12/03/2011 3.5   . VLDL 12/03/2011 21   . LDL Cholesterol 12/03/2011 124*  Admission on 10/20/2011, Discharged on 10/20/2011  Component Date Value  . Sodium 10/20/2011 139   . Potassium 10/20/2011 4.1   . Chloride 10/20/2011 103   . CO2 10/20/2011 28   . Glucose, Bld 10/20/2011 109*  . BUN  10/20/2011 15   . Creatinine, Ser 10/20/2011 1.06   . Calcium 10/20/2011 9.2   . Total Protein 10/20/2011 7.3   . Albumin 10/20/2011 3.4*  . AST 10/20/2011 18   . ALT 10/20/2011 15   . Alkaline Phosphatase 10/20/2011 97   . Total Bilirubin 10/20/2011 0.3   . GFR calc non Af Amer 10/20/2011 61*  . GFR calc Af Amer 10/20/2011 71*  . Prothrombin Time 10/20/2011 12.9   . INR 10/20/2011 0.95   Admission on 10/08/2011, Discharged on 10/08/2011  Component Date Value  . Specimen Description 10/08/2011 BACK WOUND   . Special Requests 10/08/2011 NONE   . 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   . Report Status 10/08/2011 10/11/2011 FINAL   . Specimen Description 10/08/2011 BACK WOUND   . Special Requests 10/08/2011 NONE   . Culture 10/08/2011                     Value:NO STAPHYLOCOCCUS AUREUS ISOLATED                         Note: NO MRSA ISOLATED  . **Note De-Identified Gwendolyn Woodward Obfuscation** Report Status 10/08/2011 10/11/2011 FINAL   Hospital Outpatient Visit on 10/08/2011  Component Date Value  . WBC 10/08/2011 7.3   . RBC 10/08/2011 5.18*  . Hemoglobin 10/08/2011 13.0   . HCT 10/08/2011 41.0   . MCV 10/08/2011 79.2   . [facility] 10/08/2011 25.1*  . MCHC 10/08/2011 31.7   . RDW 10/08/2011 16.0*  . Platelets 10/08/2011 190   . Sodium 10/08/2011 139   . Potassium 10/08/2011 3.6   . Chloride 10/08/2011 104   . CO2 10/08/2011 26   . Glucose, Bld 10/08/2011 169*  . BUN 10/08/2011 13   . Creatinine, Ser 10/08/2011 0.99   . Calcium 10/08/2011 9.3   . GFR calc non Af Amer 10/08/2011 66*  . GFR calc Af Amer 10/08/2011 77*     Results for this Opt Visit:     Results for orders placed in visit on 11/15/11  HEMOGLOBIN A1C      Component Value Range   Hemoglobin A1C 6.1 (*) <5.7 (%)   Mean Plasma Glucose 128 (*) <117 (mg/dL)  LIPID PANEL      Component Value Range   Cholesterol 203 (*) 0 - 200 (mg/dL)   Triglycerides 409  <811 (mg/dL)   HDL 58  >91 (mg/dL)   Total CHOL/HDL Ratio 3.5     VLDL 21  0 - 40 (mg/dL)     LDL Cholesterol 478 (*) 0 - 99 (mg/dL)    EKG Orders placed in visit on 12/27/11  . EKG 12-LEAD     Prior Assessment and Plan Problem List as of 12/27/2011          Cardiology Problems   Hypertension     Other   Obesity   Last Assessment & Plan Note   12/13/2011 Office Visit Signed 12/13/2011 11:53 AM by Salley Scarlet, MD    Discussed diet  approx 5 minutes spent    Fasting hyperglycemia   Chest pain   Gastroesophageal reflux disease   Sleep apnea   CVA (cerebral infarction)       Imaging: No results found.   FRS Calculation: Score not calculated. Missing: Total Cholesterol

## 2012-01-06 ENCOUNTER — Encounter (HOSPITAL_COMMUNITY)
Admission: RE | Admit: 2012-01-06 | Discharge: 2012-01-06 | Disposition: A | Discharge status: Home / Self Care | Payer: Medicare Other | Source: Ambulatory Visit | Attending: Cardiology | Admitting: Cardiology

## 2012-01-06 ENCOUNTER — Ambulatory Visit (INDEPENDENT_AMBULATORY_CARE_PROVIDER_SITE_OTHER): Payer: Medicare Other

## 2012-01-06 ENCOUNTER — Encounter (HOSPITAL_COMMUNITY): Payer: Self-pay | Admitting: Cardiology

## 2012-01-06 ENCOUNTER — Encounter (HOSPITAL_COMMUNITY): Payer: Self-pay

## 2012-01-06 DIAGNOSIS — R079 Chest pain, unspecified: Secondary | ICD-10-CM

## 2012-01-06 DIAGNOSIS — R0789 Other chest pain: Secondary | ICD-10-CM | POA: Insufficient documentation

## 2012-01-06 MED ORDER — TECHNETIUM TC 99M TETROFOSMIN IV KIT
30.0000 | PACK | Freq: Once | INTRAVENOUS | Status: AC | PRN
  Administered 2012-01-06: 28.5 via INTRAVENOUS

## 2012-01-06 MED ORDER — TECHNETIUM TC 99M TETROFOSMIN IV KIT
10.0000 | PACK | Freq: Once | INTRAVENOUS | Status: AC | PRN
  Administered 2012-01-06: 10.6 via INTRAVENOUS

## 2012-01-06 NOTE — Progress Notes (Signed)
Stress Lab Nurses Notes - [facility]  AKEILA LANA 01/06/2012 Reason for doing test: Chest Pain Type of test: Stress Myoview Nurse performing test: Parke Poisson, RN Nuclear Medicine Tech: Lyndel Pleasure Echo Tech: Not Applicable MD performing test: R. Rothbart Family MD: [facility] explained and consent signed: yes IV started: 22g jelco, Saline lock flushed, No redness or edema and Saline lock started in radiology Symptoms: mild SOB & Fatigue Treatment/Intervention: None Reason test stopped: fatigue After recovery IV was: Discontinued via X-ray tech and No redness or edema Patient to return to Nuc. Med at : 10:50 Patient discharged: Home Patient's Condition upon discharge was: stable Comments: During test peak BP 162/80 & HR 142.  Recovery BP 128/78 & HR 81.  Symptoms resolved in recovery. Erskine Speed T

## 2012-01-07 ENCOUNTER — Encounter (HOSPITAL_COMMUNITY): Payer: Self-pay | Admitting: Pharmacy Technician

## 2012-01-12 ENCOUNTER — Encounter: Payer: Self-pay | Admitting: *Deleted

## 2012-01-20 MED ORDER — SODIUM CHLORIDE 0.45 % IV SOLN
Freq: Once | INTRAVENOUS | Status: AC
  Administered 2012-01-27: 09:00:00 via INTRAVENOUS

## 2012-01-21 ENCOUNTER — Telehealth: Payer: Self-pay

## 2012-01-21 ENCOUNTER — Telehealth: Payer: Self-pay | Admitting: Gastroenterology

## 2012-01-21 ENCOUNTER — Ambulatory Visit: Payer: Medicare Other | Admitting: Cardiology

## 2012-01-21 NOTE — Telephone Encounter (Addendum)
Pt came by office. She thought the hospital cancelled her appt for colonoscopy today. She had received a phone call and had a vm from [place] in reference to the appt that she was supposed to have there today. She got confused, but also she had not picked up her prep. I rescheduled for 01/27/2012 at 9:15 Am and Selena Batten is aware. She got new instructions and new prescription ( as she was given before).  Updated meds etc for Dr. Darrick Penna.

## 2012-01-21 NOTE — Telephone Encounter (Signed)
REVIEWED.  

## 2012-01-21 NOTE — Telephone Encounter (Signed)
Melanie from short stay called this morning to let us know that SF first case didn't show up because she said she never received any prep instructions. Please call and [facility] procedure.

## 2012-01-21 NOTE — Telephone Encounter (Signed)
Patient was given her instructions the day of her office visit.  She never got her Rx filled.   Gwendolyn Woodward, please f/u with the patient.

## 2012-01-24 NOTE — Telephone Encounter (Signed)
DS took care of Rx on 5/31

## 2012-01-27 ENCOUNTER — Other Ambulatory Visit: Payer: Self-pay | Admitting: Respiratory Therapy

## 2012-01-27 ENCOUNTER — Ambulatory Visit (HOSPITAL_COMMUNITY)
Admission: RE | Admit: 2012-01-27 | Discharge: 2012-01-27 | Disposition: A | Discharge status: Home / Self Care | Payer: Medicare Other | Source: Ambulatory Visit | Attending: Gastroenterology | Admitting: Gastroenterology

## 2012-01-27 ENCOUNTER — Encounter (HOSPITAL_COMMUNITY)
Admission: RE | Disposition: A | Discharge status: Home / Self Care | Payer: Self-pay | Source: Ambulatory Visit | Attending: Gastroenterology

## 2012-01-27 ENCOUNTER — Encounter (HOSPITAL_COMMUNITY): Payer: Self-pay | Admitting: *Deleted

## 2012-01-27 DIAGNOSIS — R1310 Dysphagia, unspecified: Secondary | ICD-10-CM

## 2012-01-27 DIAGNOSIS — D129 Benign neoplasm of anus and anal canal: Secondary | ICD-10-CM | POA: Insufficient documentation

## 2012-01-27 DIAGNOSIS — K620 Anal polyp: Secondary | ICD-10-CM

## 2012-01-27 DIAGNOSIS — G4733 Obstructive sleep apnea (adult) (pediatric): Secondary | ICD-10-CM | POA: Insufficient documentation

## 2012-01-27 DIAGNOSIS — K2970 Gastritis, unspecified, without bleeding: Secondary | ICD-10-CM | POA: Insufficient documentation

## 2012-01-27 DIAGNOSIS — K449 Diaphragmatic hernia without obstruction or gangrene: Secondary | ICD-10-CM | POA: Insufficient documentation

## 2012-01-27 DIAGNOSIS — D128 Benign neoplasm of rectum: Secondary | ICD-10-CM | POA: Insufficient documentation

## 2012-01-27 DIAGNOSIS — E785 Hyperlipidemia, unspecified: Secondary | ICD-10-CM | POA: Insufficient documentation

## 2012-01-27 DIAGNOSIS — Z79899 Other long term (current) drug therapy: Secondary | ICD-10-CM | POA: Insufficient documentation

## 2012-01-27 DIAGNOSIS — K2990 Gastroduodenitis, unspecified, without bleeding: Secondary | ICD-10-CM

## 2012-01-27 DIAGNOSIS — Z1211 Encounter for screening for malignant neoplasm of colon: Secondary | ICD-10-CM | POA: Insufficient documentation

## 2012-01-27 DIAGNOSIS — I10 Essential (primary) hypertension: Secondary | ICD-10-CM | POA: Insufficient documentation

## 2012-01-27 DIAGNOSIS — K5730 Diverticulosis of large intestine without perforation or abscess without bleeding: Secondary | ICD-10-CM

## 2012-01-27 DIAGNOSIS — K621 Rectal polyp: Secondary | ICD-10-CM

## 2012-01-27 HISTORY — PX: COLONOSCOPY: SHX5424

## 2012-01-27 SURGERY — COLONOSCOPY
Anesthesia: Moderate Sedation

## 2012-01-27 MED ORDER — MIDAZOLAM HCL 5 MG/5ML IJ SOLN
INTRAMUSCULAR | Status: AC
  Filled 2012-01-27: qty 10

## 2012-01-27 MED ORDER — MEPERIDINE HCL 100 MG/ML IJ SOLN
INTRAMUSCULAR | Status: AC
  Filled 2012-01-27: qty 1

## 2012-01-27 MED ORDER — MEPERIDINE HCL 100 MG/ML IJ SOLN
INTRAMUSCULAR | Status: DC | PRN
  Administered 2012-01-27 (×2): 50 mg via INTRAVENOUS
  Administered 2012-01-27: 25 mg via INTRAVENOUS

## 2012-01-27 MED ORDER — STERILE WATER FOR IRRIGATION IR SOLN
Status: DC | PRN
  Administered 2012-01-27: 10:00:00

## 2012-01-27 MED ORDER — BUTAMBEN-TETRACAINE-BENZOCAINE 2-2-14 % EX AERO
INHALATION_SPRAY | CUTANEOUS | Status: DC | PRN
  Administered 2012-01-27: 2 via TOPICAL

## 2012-01-27 MED ORDER — DIETHYLPROPION HCL 25 MG PO TABS: ORAL_TABLET | ORAL | Status: DC

## 2012-01-27 MED ORDER — MIDAZOLAM HCL 5 MG/5ML IJ SOLN
INTRAMUSCULAR | Status: DC | PRN
  Administered 2012-01-27 (×2): 2 mg via INTRAVENOUS
  Administered 2012-01-27: 1 mg via INTRAVENOUS

## 2012-01-27 MED ORDER — MINERAL OIL PO OIL
TOPICAL_OIL | ORAL | Status: AC
  Filled 2012-01-27: qty 30

## 2012-01-27 NOTE — H&P (Signed)
Primary Care Physician:  Milinda Antis, MD, MD Primary Gastroenterologist:  Dr. Darrick Penna  Pre-Procedure History & Physical: HPI:  Gwendolyn Woodward is a 46 y.o. female here for DYSPHAGIA/screening.   Past Medical History  Diagnosis Date  . Hypertension     Lab 09/2011: Normal CMet except glucose of 109-169 and albumin-3.4; normal BP 09/2011-12/2011  . Asthma   . CVA (cerebral infarction) 1984 , 1988    x2, left sided weakness, history of dizziness  . Sleep apnea     Does not use CPAP - no longer has CPAP machine  . History of anemia     S/p transfusions in 1999 at [facility] after vaginal bleeding; normal CBC and 09/2011  . Gastroesophageal reflux disease     no meds  . Headache     OTC med PRN  . Arthritis     back pain and knee pain, no meds  . Depression with anxiety     History- no meds  . Chest pain     + dyspnea  . Dysphagia   . Hyperlipidemia     lipid profile in 11/2011: 203, 106, 58, 124  . Fasting hyperglycemia     Past Surgical History  Procedure Date  . Cesarean section 1984, 1987    x 2  . Wisdom tooth extraction   . Tubal ligation   . Iud removal 10/20/2011    Procedure: INTRAUTERINE DEVICE (IUD) REMOVAL;  Surgeon: Leslie Andrea, MD;  Location: [facility];  Service: Gynecology;  Laterality: N/A;  . Endometrial ablation Feb 2013  . Esophagogastroduodenoscopy 5/11    [facility]Dr Eliott Nine GERD distal esophagus, NEGATIVE bx for Barretts    Prior to Admission medications   Medication Sig Start Date End Date Taking? Authorizing Provider  albuterol (PROVENTIL HFA;VENTOLIN HFA) 108 (90 BASE) MCG/ACT inhaler Inhale 2 puffs into the lungs every 6 (six) hours as needed. Takes for shortness of breath   Yes Historical Provider, MD  amLODipine (NORVASC) 5 MG tablet Take 5 mg by mouth daily.   Yes Historical Provider, MD  aspirin EC 81 MG tablet Take 81 mg by mouth daily.   Yes Historical Provider, MD  omeprazole (PRILOSEC) 20 MG capsule Take 1 capsule (20 mg  total) by mouth 2 (two) times daily. 12/27/11 12/26/12 Yes Kathlen Brunswick, MD  nitroGLYCERIN (NITROSTAT) 0.4 MG SL tablet Place 1 tablet (0.4 mg total) under the tongue every 5 (five) minutes as needed for chest pain. 12/27/11 12/26/12  Kathlen Brunswick, MD  Omega-3 Fatty Acids (FISH OIL) 1000 MG CAPS Take 1 capsule by mouth as needed.    Historical Provider, MD    Allergies as of 12/23/2011  . (No Known Allergies)    Family History  Problem Relation Age of Onset  . Anesthesia problems Neg Hx   . Hypertension Mother   . Asthma Mother   . Cancer Father     Lung/Throat  . Heart disease Father   . Diabetes Maternal Uncle     History   Social History  . Marital Status: Divorced    Spouse Name: N/A    Number of Children: 2  . Years of Education: N/A   Occupational History  . disabled    Social History Main Topics  . Smoking status: Never Smoker   . Smokeless tobacco: Never Used  . Alcohol Use: Yes     Socially, 2 times per yr  . Drug Use: No  . Sexually Active: Not Currently  Birth Control/ Protection: Surgical   Other Topics Concern  . Not on file   Social History Narrative   Lives alone-2 grown children    Review of Systems: See HPI, otherwise negative ROS   Physical Exam: BP 148/95  Pulse 76  Temp(Src) 98.2 F (36.8 C) (Oral)  Resp 26  Ht 5' 0.5" (1.537 m)  Wt 258 lb (117.028 kg)  BMI 49.56 kg/m2  SpO2 97% General:   Alert,  pleasant and cooperative in NAD Head:  Normocephalic and atraumatic. Neck:  Supple; no masses or thyromegaly. Lungs:  Clear throughout to auscultation.    Heart:  Regular rate and rhythm. Abdomen:  Soft, nontender and nondistended. Normal bowel sounds, without guarding, and without rebound.   Neurologic:  Alert and  oriented x4;  grossly normal neurologically.  Impression/Plan:     DYSPHAGIA/screening  PLAN:  EGD/?DIL/tcs TODAY

## 2012-01-27 NOTE — Discharge Instructions (Signed)
You had 1 small polyp removed. You have diverticulosis in your colon. I dilated your esophagus TO ADDRESS YOUR PROBLEM SWALLOWING. You have gastritis, most likely due to your use of aspirin. YOU HAVE A HIATAL HERNIA. I biopsied your stomach.  LOSE WEIGHT. START TENUATE TO DECREASE YOUR APPETITE. SEE SIDE EFFECTS BELOW. CONTINUE OMEPRAZOLE EVERY MORNING.  FOLLOW A HIGH FIBER/LOW FAT DIET. AVOID ITEMS THAT CAUSE BLOATING. SEE INFO BELOW.  YOUR BIOPSY RESULTS SHOULD BE BACK IN 7 DAYS. FOLLOW UP IN 2 MOS.  Next colonoscopy in 10 years.   ENDOSCOPY Care After Read the instructions outlined below and refer to this sheet in the next week. These discharge instructions provide you with general information on caring for yourself after you leave the hospital. While your treatment has been planned according to the most current medical practices available, unavoidable complications occasionally occur. If you have any problems or questions after discharge, call DR. Kazden Largo, [phone].  ACTIVITY  You may resume your regular activity, but move at a slower pace for the next 24 hours.   Take frequent rest periods for the next 24 hours.   Walking will help get rid of the air and reduce the bloated feeling in your belly (abdomen).   No driving for 24 hours (because of the medicine (anesthesia) used during the test).   You may shower.   Do not sign any important legal documents or operate any machinery for 24 hours (because of the anesthesia used during the test).    NUTRITION  Drink plenty of fluids.   You may resume your normal diet as instructed by your doctor.   Begin with a light meal and progress to your normal diet. Heavy or fried foods are harder to digest and may make you feel sick to your stomach (nauseated).   Avoid alcoholic beverages for 24 hours or as instructed.    MEDICATIONS  You may resume your normal medications.   WHAT YOU CAN EXPECT TODAY  Some feelings of bloating  in the abdomen.   Passage of more gas than usual.   Spotting of blood in your stool or on the toilet paper  .  IF YOU HAD POLYPS REMOVED DURING THE ENDOSCOPY:  Eat a soft diet IF YOU HAVE NAUSEA, BLOATING, ABDOMINAL PAIN, OR VOMITING.    FINDING OUT THE RESULTS OF YOUR TEST Not all test results are available during your visit. DR. Darrick Penna WILL CALL YOU WITHIN 7 DAYS OF YOUR PROCEDUE WITH YOUR RESULTS. Do not assume everything is normal if you have not heard from DR. Shakir Petrosino IN ONE WEEK, CALL HER OFFICE AT [phone].  SEEK IMMEDIATE MEDICAL ATTENTION AND CALL THE OFFICE: [phone] IF:  You have more than a spotting of blood in your stool.   Your belly is swollen (abdominal distention).   You are nauseated or vomiting.   You have a temperature over 101F.   You have abdominal pain or discomfort that is severe or gets worse throughout the day.   Tenuate side effects Get emergency medical help if you have any of these signs of an allergic reaction to Tenuate: hives; difficult breathing; swelling of your face, lips, tongue, or throat.  Stop using Tenuate and call your doctor at once if you have a serious side effect such as: fast, pounding, or uneven heartbeats; chest pain, feeling short of breath (even with mild exertion); feeling like you might pass out; swelling in your ankles or feet; confusion, hallucinations, unusual thoughts or behavior; seizure (convulsions); muscle movements  you cannot control; or sudden numbness or weakness, especially on one side of the body. Less serious Tenuate side effects may include: nausea, vomiting, diarrhea, upset stomach; headache, blurred vision; feeling nervous, anxious, or jittery; sleep problems (insomnia); dizziness, drowsiness, tired feeling; depressed mood; dry mouth, unpleasant taste in your mouth; decreased sex drive; or mild itching or rash.   Polyps, Colon  A polyp is extra tissue that grows inside your body. Colon  polyps grow in the large intestine. The large intestine, also called the colon, is part of your digestive system. It is a long, hollow tube at the end of your digestive tract where your body makes and stores stool. Most polyps are not dangerous. They are benign. This means they are not cancerous. But over time, some types of polyps can turn into cancer. Polyps that are smaller than a pea are usually not harmful. But larger polyps could someday become or may already be cancerous. To be safe, doctors remove all polyps and test them.   WHO GETS POLYPS? Anyone can get polyps, but certain people are more likely than others. You may have a greater chance of getting polyps if:  You are over 50.   You have had polyps before.   Someone in your family has had polyps.   Someone in your family has had cancer of the large intestine.   Find out if someone in your family has had polyps. You may also be more likely to get polyps if you:   Eat a lot of fatty foods   Smoke   Drink alcohol   Do not exercise  Eat too much   TREATMENT  The caregiver will remove the polyp during sigmoidoscopy or colonoscopy.  PREVENTION There is not one sure way to prevent polyps. You might be able to lower your risk of getting them if you:  Eat more fruits and vegetables and less fatty food.   Do not smoke.   Avoid alcohol.   Exercise every day.   Lose weight if you are overweight.   Eating more calcium and folate can also lower your risk of getting polyps. Some foods that are rich in calcium are milk, cheese, and broccoli. Some foods that are rich in folate are chickpeas, kidney beans, and spinach.   High-Fiber Diet A high-fiber diet changes your normal diet to include more whole grains, legumes, fruits, and vegetables. Changes in the diet involve replacing refined carbohydrates with unrefined foods. The calorie level of the diet is essentially unchanged. The Dietary Reference Intake (recommended amount) for  adult males is 38 grams per day. For adult females, it is 25 grams per day. Pregnant and lactating women should consume 28 grams of fiber per day. Fiber is the intact part of a plant that is not broken down during digestion. Functional fiber is fiber that has been isolated from the plant to provide a beneficial effect in the body. PURPOSE  Increase stool bulk.   Ease and regulate bowel movements.   Lower cholesterol.  INDICATIONS THAT YOU NEED MORE FIBER  Constipation and hemorrhoids.   Uncomplicated diverticulosis (intestine condition) and irritable bowel syndrome.   Weight management.   As a protective measure against hardening of the arteries (atherosclerosis), diabetes, and cancer.   GUIDELINES FOR INCREASING FIBER IN THE DIET  Start adding fiber to the diet slowly. A gradual increase of about 5 more grams (2 slices of whole-wheat bread, 2 servings of most fruits or vegetables, or 1 bowl of high-fiber cereal) per  day is best. Too rapid an increase in fiber may result in constipation, flatulence, and bloating.   Drink enough water and fluids to keep your urine clear or pale yellow. Water, juice, or caffeine-free drinks are recommended. Not drinking enough fluid may cause constipation.   Eat a variety of high-fiber foods rather than one type of fiber.   Try to increase your intake of fiber through using high-fiber foods rather than fiber pills or supplements that contain small amounts of fiber.   The goal is to change the types of food eaten. Do not supplement your present diet with high-fiber foods, but replace foods in your present diet.  INCLUDE A VARIETY OF FIBER SOURCES  Replace refined and processed grains with whole grains, canned fruits with fresh fruits, and incorporate other fiber sources. White rice, white breads, and most bakery goods contain little or no fiber.   Brown whole-grain rice, buckwheat oats, and many fruits and vegetables are all good sources of fiber.  These include: broccoli, Brussels sprouts, cabbage, cauliflower, beets, sweet potatoes, white potatoes (skin on), carrots, tomatoes, eggplant, squash, berries, fresh fruits, and dried fruits.   Cereals appear to be the richest source of fiber. Cereal fiber is found in whole grains and bran. Bran is the fiber-rich outer coat of cereal grain, which is largely removed in refining. In whole-grain cereals, the bran remains. In breakfast cereals, the largest amount of fiber is found in those with "bran" in their names. The fiber content is sometimes indicated on the label.   You may need to include additional fruits and vegetables each day.   In baking, for 1 cup white flour, you may use the following substitutions:   1 cup whole-wheat flour minus 2 tablespoons.   1/2 cup white flour plus 1/2 cup whole-wheat flour.    Low-Fat Diet BREADS, CEREALS, PASTA, RICE, DRIED PEAS, AND BEANS These products are high in carbohydrates and most are low in fat. Therefore, they can be increased in the diet as substitutes for fatty foods. They too, however, contain calories and should not be eaten in excess. Cereals can be eaten for snacks as well as for breakfast.  Include foods that contain fiber (fruits, vegetables, whole grains, and legumes). Research shows that fiber may lower blood cholesterol levels, especially the water-soluble fiber found in fruits, vegetables, oat products, and legumes. FRUITS AND VEGETABLES It is good to eat fruits and vegetables. Besides being sources of fiber, both are rich in vitamins and some minerals. They help you get the daily allowances of these nutrients. Fruits and vegetables can be used for snacks and desserts. MEATS Limit lean meat, chicken, [place], and fish to no more than 6 ounces per day. Beef, Pork, and Lamb Use lean cuts of beef, pork, and lamb. Lean cuts include:  Extra-lean ground beef.  Arm roast.  Sirloin tip.  Center-cut ham.  Round steak.  Loin chops.  Rump  roast.  Tenderloin.  Trim all fat off the outside of meats before cooking. It is not necessary to severely decrease the intake of red meat, but lean choices should be made. Lean meat is rich in protein and contains a highly absorbable form of iron. Premenopausal women, in particular, should avoid reducing lean red meat because this could increase the risk for low red blood cells (iron-deficiency anemia).  Chicken and [place] These are good sources of protein. The fat of poultry can be reduced by removing the skin and underlying fat layers before cooking. Chicken and [place] can  be substituted for lean red meat in the diet. Poultry should not be fried or covered with high-fat sauces. Fish and Shellfish Fish is a good source of protein. Shellfish contain cholesterol, but they usually are low in saturated fatty acids. The preparation of fish is important. Like chicken and [place], they should not be fried or covered with high-fat sauces. EGGS Egg whites contain no fat or cholesterol. They can be eaten often. Try 1 to 2 egg whites instead of whole eggs in recipes or use egg substitutes that do not contain yolk.  MILK AND DAIRY PRODUCTS Use skim or 1% milk instead of 2% or whole milk. Decrease whole milk, natural, and processed cheeses. Use nonfat or low-fat (2%) cottage cheese or low-fat cheeses made from vegetable oils. Choose nonfat or low-fat (1 to 2%) yogurt. Experiment with evaporated skim milk in recipes that call for heavy cream. Substitute low-fat yogurt or low-fat cottage cheese for sour cream in dips and salad dressings. Have at least 2 servings of low-fat dairy products, such as 2 glasses of skim (or 1%) milk each day to help get your daily calcium intake.  FATS AND OILS Butterfat, lard, and beef fats are high in saturated fat and cholesterol. These should be avoided.Vegetable fats do not contain cholesterol. AVOID coconut oil, palm oil, and palm kernel oil, WHICH are very high in saturated fats.  These should be limited. These fats are often used in bakery goods, processed foods, popcorn, oils, and nondairy creamers. Vegetable shortenings and some peanut butters contain hydrogenated oils, which are also saturated fats. Read the labels on these foods and check for saturated vegetable oils.  Desirable liquid vegetable oils are corn oil, cottonseed oil, olive oil, canola oil, safflower oil, soybean oil, and sunflower oil. Peanut oil is not as good, but small amounts are acceptable. Buy a heart-healthy tub margarine that has no partially hydrogenated oils in the ingredients. AVOID Mayonnaise and salad dressings often are made from unsaturated fats.  OTHER EATING TIPS Snacks  Most sweets should be limited as snacks. They tend to be rich in calories and fats, and their caloric content outweighs their nutritional value. Some good choices in snacks are graham crackers, melba toast, soda crackers, bagels (no egg), English muffins, fruits, and vegetables. These snacks are preferable to snack crackers, [place] fries, and chips. Popcorn should be air-popped or cooked in small amounts of liquid vegetable oil.  Desserts Eat fruit, low-fat yogurt, and fruit ices instead of pastries, cake, and cookies. Sherbet, angel food cake, gelatin dessert, frozen low-fat yogurt, or other frozen products that do not contain saturated fat (pure fruit juice bars, frozen ice pops) are also acceptable.   COOKING METHODS Choose those methods that use little or no fat. They include: Poaching.  Braising.  Steaming.  Grilling.  Baking.  Stir-frying.  Broiling.  Microwaving.  Foods can be cooked in a nonstick pan without added fat, or use a nonfat cooking spray in regular cookware. Limit fried foods and avoid frying in saturated fat. Add moisture to lean meats by using water, broth, cooking wines, and other nonfat or low-fat sauces along with the cooking methods mentioned above. Soups and stews should be chilled after  cooking. The fat that forms on top after a few hours in the refrigerator should be skimmed off. When preparing meals, avoid using excess salt. Salt can contribute to raising blood pressure in some people.  EATING AWAY FROM HOME Order entres, potatoes, and vegetables without sauces or butter. When meat exceeds  the size of a deck of cards (3 to 4 ounces), the rest can be taken home for another meal. Choose vegetable or fruit salads and ask for low-calorie salad dressings to be served on the side. Use dressings sparingly. Limit high-fat toppings, such as bacon, crumbled eggs, cheese, sunflower seeds, and olives. Ask for heart-healthy tub margarine instead of butter.   Diverticulosis Diverticulosis is a common condition that develops when small pouches (diverticula) form in the wall of the colon. The risk of diverticulosis increases with age. It happens more often in people who eat a low-fiber diet. Most individuals with diverticulosis have no symptoms. Those individuals with symptoms usually experience belly (abdominal) pain, constipation, or loose stools (diarrhea).  HOME CARE INSTRUCTIONS  Increase the amount of fiber in your diet as directed by your caregiver or dietician. This may reduce symptoms of diverticulosis.   Drink at least 6 to 8 glasses of water each day to prevent constipation.   Try not to strain when you have a bowel movement.   Avoiding nuts and seeds to prevent complications is still an uncertain benefit.   FOODS HAVING HIGH FIBER CONTENT INCLUDE:  Fruits. Apple, peach, pear, tangerine, raisins, prunes.   Vegetables. Brussels sprouts, asparagus, broccoli, cabbage, carrot, cauliflower, romaine lettuce, spinach, summer squash, tomato, winter squash, zucchini.   Starchy Vegetables. Baked beans, kidney beans, lima beans, split peas, lentils, potatoes (with skin).   Grains. Whole wheat bread, brown rice, bran flake cereal, plain oatmeal, white rice, shredded wheat, bran  muffins.   SEEK IMMEDIATE MEDICAL CARE IF:  You develop increasing pain or severe bloating.   You have an oral temperature above 101F.   You develop vomiting or bowel movements that are bloody or black.

## 2012-01-27 NOTE — Op Note (Signed)
[facility] [address] [place], [place]  [address]  COLONOSCOPY PROCEDURE REPORT  PATIENT:  Gwendolyn, Woodward  MR#:  [id number] BIRTHDATE:  [birth year], 46 yrs. old  GENDER:  female  ENDOSCOPIST:  Jonette Eva, MD REF. BY:  DR. Kingsley Spittle Qulin ASSISTANT:  PROCEDURE DATE:  01/27/2012 PROCEDURE:  ILEOColonoscopy with biopsy  INDICATIONS:  Screening  MEDICATIONS:   Demerol 100 mg IV, Versed 4 mg IV  DESCRIPTION OF PROCEDURE:    Physical exam was performed. Informed consent was obtained from the patient after explaining the benefits, risks, and alternatives to procedure.  The patient was connected to monitor and placed in left lateral position. Continuous oxygen was provided by nasal cannula and IV medicine administered through an indwelling cannula.  After administration of sedation and rectal exam, the patient's rectum was intubated and the [id number] ([id number]) colonoscope was advanced under direct visualization to the ILEUM.  The scope was removed slowly by carefully examining the color, texture, anatomy, and integrity mucosa on the way out.  The patient was recovered in endoscopy and discharged home in satisfactory condition. <<PROCEDUREIMAGES>>  FINDINGS:  There were TWO 2 SESSILE polyps identified and removed. in the rectum VIA COLD FORCEPS.  FREQUENT Diverticula were found in the sigmoid to descending colon segments.  PREP QUALITY: EXCELLENT CECAL W/D TIME:    8.5 minutes  COMPLICATIONS:    None  ENDOSCOPIC IMPRESSION: 1) Polyps, multiple in the rectum 2) MILD Diverticulosis in the sigmoid to descending colon segments  RECOMMENDATIONS: AWAIT BIOPSY HIGH FIBER DIET TCS IN 10 YEARS LOSE WEIGHT-ADD TENUATE OPV IN 2 MOS  REPEAT EXAM:  No  ______________________________ Jonette Eva, MD  CC:  n. eSIGNED:   Pauletta Pickney at 01/27/2012 10:09 AM  Eli Phillips, [phone]

## 2012-01-27 NOTE — Op Note (Signed)
[facility] [address] [place], [place]  [address]  ENDOSCOPY PROCEDURE REPORT  PATIENT:  Gwendolyn Woodward, Gwendolyn Woodward  MR#:  [id number] BIRTHDATE:  1966-06-19, 46 yrs. old  GENDER:  female  ENDOSCOPIST:  Jonette Eva, MD ASSISTANT: Referred by:  Milinda Antis, M.D.  PROCEDURE DATE:  01/27/2012 PROCEDURE:  EGD with biopsy, EGD with dilatation over guidewire ASA CLASS: INDICATIONS:  DYSPHAGIA TO LIQUIDS. PROBLEMS GETTIN INTO ESOPHAGUS. PMHx: GERD BMI > 50  MEDICATIONS:   Demerol 25 mg IV, Versed 2 mg IV TOPICAL ANESTHETIC:  Cetacaine Spray  DESCRIPTION OF PROCEDURE:   After the risks benefits and alternatives of the procedure were thoroughly explained, informed consent was obtained.  The [id number] ([id number]) endoscope was introduced through the mouth and advanced to the second portion of the duodenum.  The instrument was slowly withdrawn as the mucosa was carefully examined.  Prior to withdrawal of the scope, the guidwire was placed.  The esophagus was dilated successfully.  The patient was recovered in endoscopy and discharged home in satisfactory condition. <<PROCEDUREIMAGES>>  IRREGULAR Z LINE. A 1-2 CM SLIDING hiatal hernia was found.  Mild gastritis was found & BIOPSIED VIA COLD FORCEPS.    Dilation was then performed at the total esophagus. NL DUODENUM.  1) Dilator:  Savary over guidewire  Size(s):  15-16 Resistance:  minimal  Heme:  none Appearance:  COMPLICATIONS:  None  ENDOSCOPIC IMPRESSION: 1) Hiatal hernia 2) Mild gastritis 3) DYSPHAGIA RECOMMENDATIONS: CONTINUE OMP LOSE WEIGHT ADD TENUATE. PT NEEDS TO LOSE 10 LBS OR MEDS WILL NOT BE CONTINUED. LOW FAT /HIGH FIBER DIET OPV IN 2 MOS.  REPEAT EXAM:  No  ______________________________ Jonette Eva, MD  CC:  n. eSIGNED:   Divit Stipp at 01/27/2012 11:39 AM  Eli Phillips, [phone]

## 2012-01-27 NOTE — Progress Notes (Signed)
REVIEWED.  

## 2012-02-01 ENCOUNTER — Telehealth: Payer: Self-pay | Admitting: Gastroenterology

## 2012-02-01 ENCOUNTER — Encounter (HOSPITAL_COMMUNITY): Payer: Self-pay | Admitting: Gastroenterology

## 2012-02-01 NOTE — Telephone Encounter (Signed)
Reminder in epic to follow up with SF in 2 months in E30 °

## 2012-02-01 NOTE — Telephone Encounter (Signed)
Results Cc to PCP & reminder is in the computer 

## 2012-02-01 NOTE — Telephone Encounter (Addendum)
Please call pt. She had A HYPERPLASTIC POLYP removed from her colon. HER stomach Bx shows gastritis due to ASA USE.  LOSE WEIGHT. START TENUATE TO DECREASE YOUR APPETITE.  CONTINUE OMEPRAZOLE EVERY MORNING.  FOLLOW A HIGH FIBER/LOW FAT DIET. AVOID ITEMS THAT CAUSE BLOATING.   FOLLOW UP IN 2 MOS E30.  Next colonoscopy in 10 years.

## 2012-02-01 NOTE — Telephone Encounter (Signed)
LMOM to call back

## 2012-02-02 NOTE — Telephone Encounter (Signed)
Called. Could not leave message

## 2012-02-02 NOTE — Telephone Encounter (Signed)
Tried to call with no answer  

## 2012-02-03 ENCOUNTER — Telehealth: Payer: Self-pay

## 2012-02-03 NOTE — Telephone Encounter (Signed)
Mailing a letter for pt to call.  

## 2012-02-03 NOTE — Telephone Encounter (Signed)
Pt returned call and was informed.  

## 2012-02-25 ENCOUNTER — Emergency Department (HOSPITAL_COMMUNITY): Payer: Medicare Other

## 2012-02-25 ENCOUNTER — Encounter (HOSPITAL_COMMUNITY): Payer: Self-pay | Admitting: *Deleted

## 2012-02-25 ENCOUNTER — Emergency Department (HOSPITAL_COMMUNITY)
Admission: EM | Admit: 2012-02-25 | Discharge: 2012-02-25 | Disposition: A | Discharge status: Home / Self Care | Payer: Medicare Other | Attending: Emergency Medicine | Admitting: Emergency Medicine

## 2012-02-25 DIAGNOSIS — J45909 Unspecified asthma, uncomplicated: Secondary | ICD-10-CM | POA: Insufficient documentation

## 2012-02-25 DIAGNOSIS — X58XXXA Exposure to other specified factors, initial encounter: Secondary | ICD-10-CM | POA: Insufficient documentation

## 2012-02-25 DIAGNOSIS — E785 Hyperlipidemia, unspecified: Secondary | ICD-10-CM | POA: Insufficient documentation

## 2012-02-25 DIAGNOSIS — S93409A Sprain of unspecified ligament of unspecified ankle, initial encounter: Secondary | ICD-10-CM | POA: Insufficient documentation

## 2012-02-25 DIAGNOSIS — M19079 Primary osteoarthritis, unspecified ankle and foot: Secondary | ICD-10-CM | POA: Insufficient documentation

## 2012-02-25 DIAGNOSIS — I10 Essential (primary) hypertension: Secondary | ICD-10-CM | POA: Insufficient documentation

## 2012-02-25 DIAGNOSIS — S96919A Strain of unspecified muscle and tendon at ankle and foot level, unspecified foot, initial encounter: Secondary | ICD-10-CM

## 2012-02-25 DIAGNOSIS — M19071 Primary osteoarthritis, right ankle and foot: Secondary | ICD-10-CM

## 2012-02-25 DIAGNOSIS — K219 Gastro-esophageal reflux disease without esophagitis: Secondary | ICD-10-CM | POA: Insufficient documentation

## 2012-02-25 MED ORDER — ACETAMINOPHEN 325 MG PO TABS
650.0000 mg | ORAL_TABLET | Freq: Once | ORAL | Status: AC
  Administered 2012-02-25: 650 mg via ORAL
  Filled 2012-02-25: qty 2

## 2012-02-25 NOTE — ED Notes (Signed)
Reports blacked out x 1 month ago, falling, injuring right ankle.  C/o pain/swelling since.

## 2012-02-25 NOTE — ED Provider Notes (Signed)
History     CSN: [id number]  Arrival date & time 02/25/12  1006   First MD Initiated Contact with Patient 02/25/12 1028      Chief Complaint  Patient presents with  . Ankle Pain    (Consider location/radiation/quality/duration/timing/severity/associated sxs/prior treatment) HPI Comments: Gwendolyn Woodward presents for ongoing pain and swelling of her right ankle since falling 1 month ago during a black out spell.  She reports occasional episodes of syncope which she been evaluated for by her pcp, last episode occuring 1 month ago.  She has been soaking her right ankle in hot water and rubbing it with alcohol which has not relieved the pain or swelling.  She denies excessive standing or walking,  Is disabled and does not work.  She has tried no medications for relief of pain or swelling, nor has she seen her doctor about this complaint.  Pain is constant,  Worse with weight bearing and does not radiate.  The history is provided by the patient.    Past Medical History  Diagnosis Date  . Hypertension     Lab 09/2011: Normal CMet except glucose of 109-169 and albumin-3.4; normal BP 09/2011-12/2011  . Asthma   . CVA (cerebral infarction) 1984 , 1988    x2, left sided weakness, history of dizziness  . Sleep apnea     Does not use CPAP - no longer has CPAP machine  . History of anemia     S/p transfusions in 1999 at [facility] after vaginal bleeding; normal CBC and 09/2011  . Gastroesophageal reflux disease     no meds  . Headache     OTC med PRN  . Arthritis     back pain and knee pain, no meds  . Depression with anxiety     History- no meds  . Chest pain     + dyspnea  . Dysphagia   . Hyperlipidemia     lipid profile in 11/2011: 203, 106, 58, 124  . Fasting hyperglycemia     Past Surgical History  Procedure Date  . Cesarean section 1984, 1987    x 2  . Wisdom tooth extraction   . Tubal ligation   . Iud removal 10/20/2011    Procedure: INTRAUTERINE DEVICE (IUD) REMOVAL;  Surgeon:  Leslie Andrea, MD;  Location: [facility];  Service: Gynecology;  Laterality: N/A;  . Endometrial ablation Feb 2013  . Esophagogastroduodenoscopy 5/11    [facility]-Dr Eliott Nine GERD distal esophagus, NEGATIVE bx for Barretts  . Colonoscopy 01/27/2012    Procedure: COLONOSCOPY;  Surgeon: West Bali, MD;  Location: [facility];  Service: Endoscopy;  Laterality: N/A;  8:30    Family History  Problem Relation Age of Onset  . Anesthesia problems Neg Hx   . Hypertension Mother   . Asthma Mother   . Cancer Father     Lung/Throat  . Heart disease Father   . Diabetes Maternal Uncle     History  Substance Use Topics  . Smoking status: Never Smoker   . Smokeless tobacco: Never Used  . Alcohol Use: Yes     Socially, 2 times per yr    OB History    Grav Para Term Preterm Abortions TAB SAB Ect Mult Living   2 2 2  0 0 0 0 0 0 2      Review of Systems  Musculoskeletal: Positive for joint swelling and arthralgias.  Skin: Negative for wound.  Neurological: Negative for weakness and numbness.  Allergies  Review of patient's allergies indicates no known allergies.  Home Medications   Current Outpatient Rx  Name Route Sig Dispense Refill  . ALBUTEROL SULFATE HFA 108 (90 BASE) MCG/ACT IN AERS Inhalation Inhale 2 puffs into the lungs every 6 (six) hours as needed. Takes for shortness of breath    . AMLODIPINE BESYLATE 5 MG PO TABS Oral Take 5 mg by mouth daily.    Marland Kitchen DIETHYLPROPION HCL 25 MG PO TABS  1 po 1 hour prior to meals TID 90 each 0  . FISH OIL 1000 MG PO CAPS Oral Take 1 capsule by mouth as needed.    Marland Kitchen OMEPRAZOLE 20 MG PO CPDR Oral Take 1 capsule (20 mg total) by mouth 2 (two) times daily. 62 capsule 3    Dose increase  . NITROGLYCERIN 0.4 MG SL SUBL Sublingual Place 1 tablet (0.4 mg total) under the tongue every 5 (five) minutes as needed for chest pain. 25 tablet 3    BP 170/107  Pulse 86  Temp 97.7 F (36.5 C)  Resp 20  Ht 5' (1.524 m)  Wt 257 lb  (116.574 kg)  BMI 50.19 kg/m2  SpO2 99%  Physical Exam  Nursing note and vitals reviewed. Constitutional: She appears well-developed and well-nourished.  HENT:  Head: Normocephalic.  Cardiovascular: Normal rate and intact distal pulses.  Exam reveals no decreased pulses.   Pulses:      Dorsalis pedis pulses are 2+ on the right side, and 2+ on the left side.       Posterior tibial pulses are 2+ on the right side, and 2+ on the left side.  Musculoskeletal: She exhibits edema and tenderness.       Right ankle: She exhibits swelling. She exhibits normal range of motion, no deformity and normal pulse. tenderness. No head of 5th metatarsal and no proximal fibula tenderness found. Achilles tendon normal.       Feet:  Neurological: She is alert. No sensory deficit.  Skin: Skin is warm, dry and intact.    ED Course  Procedures (including critical care time)  Labs Reviewed - No data to display Dg Ankle Complete Right  02/25/2012  *RADIOLOGY REPORT*  Clinical Data: Ankle pain and swelling.  RIGHT ANKLE - COMPLETE 3+ VIEW  Comparison: Contralateral ankle 10/20/2010.  Findings: Anatomic alignment the ankle.  The mortise is congruent. The talar dome is intact.  Small anterior tibial spur is present, which appears similar to the contralateral extremity.  The calcaneus demonstrates small spurs.  IMPRESSION: No acute abnormality.  Original Report Authenticated By: Andreas Newport, M.D.     1. Ankle sprain and strain   2. Degenerative joint disease of right ankle       MDM  Aso provided.  RICE,  Pt has crutches and a walker at home as well.  F/u  pcp this week.       Burgess Amor, PA 02/25/12 1056

## 2012-02-25 NOTE — ED Provider Notes (Signed)
Medical screening examination/treatment/procedure(s) were performed by non-physician practitioner and as supervising physician I was immediately available for consultation/collaboration.   Zayed Griffie W Darolyn Double, MD 02/25/12 1543 

## 2012-03-13 ENCOUNTER — Ambulatory Visit: Payer: Medicaid Other | Admitting: Family Medicine

## 2012-03-22 ENCOUNTER — Encounter: Payer: Self-pay | Admitting: Gastroenterology

## 2012-04-26 ENCOUNTER — Encounter: Payer: Self-pay | Admitting: Gastroenterology

## 2012-04-26 ENCOUNTER — Ambulatory Visit (INDEPENDENT_AMBULATORY_CARE_PROVIDER_SITE_OTHER): Payer: Medicare Other | Admitting: Gastroenterology

## 2012-04-26 VITALS — BP 156/100 | HR 96 | Temp 97.4°F | Ht 60.0 in | Wt 257.6 lb

## 2012-04-26 DIAGNOSIS — R1314 Dysphagia, pharyngoesophageal phase: Secondary | ICD-10-CM

## 2012-04-26 DIAGNOSIS — E669 Obesity, unspecified: Secondary | ICD-10-CM

## 2012-04-26 DIAGNOSIS — K219 Gastro-esophageal reflux disease without esophagitis: Secondary | ICD-10-CM

## 2012-04-26 DIAGNOSIS — R1310 Dysphagia, unspecified: Secondary | ICD-10-CM

## 2012-04-26 DIAGNOSIS — R1319 Other dysphagia: Secondary | ICD-10-CM | POA: Insufficient documentation

## 2012-04-26 MED ORDER — DIETHYLPROPION HCL 25 MG PO TABS: ORAL_TABLET | ORAL | Status: DC

## 2012-04-26 NOTE — Assessment & Plan Note (Addendum)
SUPER MORBID OBESITY, WEIGHT UNCHANGED BUT PT CONSUMING EXTRA CALORIES VIA KOOL-AID & SWEET TEA.  TENUATE BEFORE MEALS. rx given for 9/4 and 10/4. DRINK WATER. AVOID KOOL-AID, SWEET TEA, JUICE, & SODA, FOLLOW UP IN 2 MOS.

## 2012-04-26 NOTE — Progress Notes (Signed)
Subjective:    Patient ID: Gwendolyn Woodward, female    DOB: [birth year], 46 y.o.   MRN: [id number]  PCP:   HPI  Been on everything it is and not losing weight. TAKING TENUATE 3 TIMES A DAY. GAINED 1 LB. DRINKING KOOL-AID- > 2 QUARTS A DAY, JUICE, SWEET TEA: 64 OZ, AND RARE SODAS. DRINKS WATER WITH ADDED FLAVOR DROPS. NOT TAKING ASA. HAD INTERNAL BLEEDING AT [facility] AFTER TAKING LARGE DOSES OF ASA BECAUSE SHE WAS TRYING TO SATISFY A CHALKY TASTE.  SWALLOWING OK. REFLUX NOT IDEALLY CONTROLLED.   Past Medical History  Diagnosis Date  . Hypertension     Lab 09/2011: Normal CMet except glucose of 109-169 and albumin-3.4; normal BP 09/2011-12/2011  . Asthma   . CVA (cerebral infarction) 1984 , 1988    x2, left sided weakness, history of dizziness  . Sleep apnea     Does not use CPAP - no longer has CPAP machine  . History of anemia     S/p transfusions in 1999 at [facility] after vaginal bleeding; normal CBC and 09/2011  . Gastroesophageal reflux disease     no meds  . Headache     OTC med PRN  . Arthritis     back pain and knee pain, no meds  . Depression with anxiety     History- no meds  . Chest pain     + dyspnea  . Dysphagia   . Hyperlipidemia     lipid profile in 11/2011: 203, 106, 58, 124  . Fasting hyperglycemia     Past Surgical History  Procedure Date  . Cesarean section 1984, 1987    x 2  . Wisdom tooth extraction   . Tubal ligation   . Iud removal 10/20/2011    Procedure: INTRAUTERINE DEVICE (IUD) REMOVAL;  Surgeon: Leslie Andrea, MD;  Location: [facility];  Service: Gynecology;  Laterality: N/A;  . Endometrial ablation Feb 2013  . Esophagogastroduodenoscopy 5/11    [facility]-Dr Eliott Nine GERD distal esophagus, NEGATIVE bx for Barretts  . Colonoscopy 01/27/2012    Procedure: COLONOSCOPY;  Surgeon: West Bali, MD;  Location: [facility];  Service: Endoscopy;  Laterality: N/A;  8:30   No Known Allergies  Current Outpatient Prescriptions    Medication Sig Dispense Refill  . albuterol (PROVENTIL HFA;VENTOLIN HFA) 108 (90 BASE) MCG/ACT inhaler Inhale 2 puffs into the lungs every 6 (six) hours as needed. Takes for shortness of breath      . amLODipine (NORVASC) 5 MG tablet Take 5 mg by mouth daily.      . nitroGLYCERIN (NITROSTAT) 0.4 MG SL tablet Place 1 tablet (0.4 mg total) under the tongue every 5 (five) minutes as needed for chest pain.    . Omega-3 Fatty Acids (FISH OIL) 1000 MG CAPS Take 1 capsule by mouth as needed.    Marland Kitchen omeprazole (PRILOSEC) 20 MG capsule Take 1 capsule (20 mg total) by mouth 2 (two) times daily.        Review of Systems     Objective:   Physical Exam  Vitals reviewed. Constitutional: She is oriented to person, place, and time. No distress.  HENT:  Head: Normocephalic and atraumatic.  Mouth/Throat: Oropharynx is clear and moist. No oropharyngeal exudate.  Eyes: Pupils are equal, round, and reactive to light. No scleral icterus.  Neck: Normal range of motion. Neck supple.  Cardiovascular: Normal rate and regular rhythm.   Pulmonary/Chest: Effort normal and breath sounds normal.  Abdominal: Soft.  Bowel sounds are normal. She exhibits no distension. There is no tenderness.       OBESE    Neurological: She is alert and oriented to person, place, and time.       NO FOCAL DEFICITS   Psychiatric: She has a normal mood and affect.          Assessment & Plan:

## 2012-04-26 NOTE — Assessment & Plan Note (Signed)
SX RESOLVED.  CONTINUE BID OMP.

## 2012-04-26 NOTE — Assessment & Plan Note (Signed)
OMEPRAZOLE BID LOW FAT DIET LOSE WEIGHT STOP DRINKING KOOL-AID & SWEET TEA. OPV IN 2 MOS.

## 2012-04-26 NOTE — Progress Notes (Signed)
Faxed to PCP

## 2012-04-26 NOTE — Patient Instructions (Addendum)
TAKE YOUR ASPIRIN DAILY.  CONTINUE OMEPRAZOLE TWICE DAILY. TAKE 30 MINUTES TO 1 HOUR PRIOR TO MEALS.  CONTINUE TENUATE. TAKE  HOUR BEFORE MEALS UP TO 3 TIMES A DAY.  DRINK WATER. AVOID KOOL-AID, SWEET TEA, JUICE, & SODA.  FOLLOW A LOW FAT DIET. SEE INFO BELOW.  FOLLOW UP IN 2 MOS.     Lifestyle and home remedies  You may eliminate or reduce the frequency of heartburn by making the following lifestyle changes:    Control your weight. Being overweight is a major risk factor for heartburn and GERD. Excess pounds put pressure on your abdomen, pushing up your stomach and causing acid to back up into your esophagus.     Eat smaller meals. 4 TO 6 MEALS A DAY. This reduces pressure on the lower esophageal sphincter, helping to prevent the valve from opening and acid from washing back into your esophagus.     Loosen your belt. Clothes that fit tightly around your waist put pressure on your abdomen and the lower esophageal sphincter.     Eliminate heartburn triggers. Everyone has specific triggers. Common triggers such as fatty or fried foods, spicy food, tomato sauce, carbonated beverages, alcohol, chocolate, mint, garlic, onion, caffeine and nicotine may make heartburn worse.     Avoid stooping or bending. Tying your shoes is OK. Bending over for longer periods to weed your garden isn't, especially soon after eating.     Don't lie down after a meal. Wait at least three to four hours after eating before going to bed, and don't lie down right after eating.    Alternative medicine   Several home remedies exist for treating GERD, but they provide only temporary relief. They include drinking baking soda (sodium bicarbonate) added to water or drinking other fluids such as baking soda mixed with cream of tartar and water.    Although these liquids create temporary relief by neutralizing, washing away or buffering acids, eventually they aggravate the situation by adding gas and fluid to your  stomach, increasing pressure and causing more acid reflux. Further, adding more sodium to your diet may increase your blood pressure and add stress to your heart, and excessive bicarbonate ingestion can alter the acid-base balance in your body.  Low-Fat Diet BREADS, CEREALS, PASTA, RICE, DRIED PEAS, AND BEANS These products are high in carbohydrates and most are low in fat. Therefore, they can be increased in the diet as substitutes for fatty foods. They too, however, contain calories and should not be eaten in excess. Cereals can be eaten for snacks as well as for breakfast.   FRUITS AND VEGETABLES It is good to eat fruits and vegetables. Besides being sources of fiber, both are rich in vitamins and some minerals. They help you get the daily allowances of these nutrients. Fruits and vegetables can be used for snacks and desserts.  MEATS Limit lean meat, chicken, [place], and fish to no more than 6 ounces per day. Beef, Pork, and Lamb Use lean cuts of beef, pork, and lamb. Lean cuts include:  Extra-lean ground beef.  Arm roast.  Sirloin tip.  Center-cut ham.  Round steak.  Loin chops.  Rump roast.  Tenderloin.  Trim all fat off the outside of meats before cooking. It is not necessary to severely decrease the intake of red meat, but lean choices should be made. Lean meat is rich in protein and contains a highly absorbable form of iron. Premenopausal women, in particular, should avoid reducing lean red meat because this  could increase the risk for low red blood cells (iron-deficiency anemia).  Chicken and [place] These are good sources of protein. The fat of poultry can be reduced by removing the skin and underlying fat layers before cooking. Chicken and [place] can be substituted for lean red meat in the diet. Poultry should not be fried or covered with high-fat sauces. Fish and Shellfish Fish is a good source of protein. Shellfish contain cholesterol, but they usually are low in saturated  fatty acids. The preparation of fish is important. Like chicken and [place], they should not be fried or covered with high-fat sauces. EGGS Egg whites contain no fat or cholesterol. They can be eaten often. Try 1 to 2 egg whites instead of whole eggs in recipes or use egg substitutes that do not contain yolk. MILK AND DAIRY PRODUCTS Use skim or 1% milk instead of 2% or whole milk. Decrease whole milk, natural, and processed cheeses. Use nonfat or low-fat (2%) cottage cheese or low-fat cheeses made from vegetable oils. Choose nonfat or low-fat (1 to 2%) yogurt. Experiment with evaporated skim milk in recipes that call for heavy cream. Substitute low-fat yogurt or low-fat cottage cheese for sour cream in dips and salad dressings. Have at least 2 servings of low-fat dairy products, such as 2 glasses of skim (or 1%) milk each day to help get your daily calcium intake. FATS AND OILS Reduce the total intake of fats, especially saturated fat. Butterfat, lard, and beef fats are high in saturated fat and cholesterol. These should be avoided as much as possible. Vegetable fats do not contain cholesterol, but certain vegetable fats, such as coconut oil, palm oil, and palm kernel oil are very high in saturated fats. These should be limited. These fats are often used in bakery goods, processed foods, popcorn, oils, and nondairy creamers. Vegetable shortenings and some peanut butters contain hydrogenated oils, which are also saturated fats. Read the labels on these foods and check for saturated vegetable oils. Unsaturated vegetable oils and fats do not raise blood cholesterol. However, they should be limited because they are fats and are high in calories. Total fat should still be limited to 30% of your daily caloric intake. Desirable liquid vegetable oils are corn oil, cottonseed oil, olive oil, canola oil, safflower oil, soybean oil, and sunflower oil. Peanut oil is not as good, but small amounts are acceptable. Buy a  heart-healthy tub margarine that has no partially hydrogenated oils in the ingredients. Mayonnaise and salad dressings often are made from unsaturated fats, but they should also be limited because of their high calorie and fat content. Seeds, nuts, peanut butter, olives, and avocados are high in fat, but the fat is mainly the unsaturated type. These foods should be limited mainly to avoid excess calories and fat. OTHER EATING TIPS Snacks  Most sweets should be limited as snacks. They tend to be rich in calories and fats, and their caloric content outweighs their nutritional value. Some good choices in snacks are graham crackers, melba toast, soda crackers, bagels (no egg), English muffins, fruits, and vegetables. These snacks are preferable to snack crackers, [place] fries, TORTILLA CHIPS, and POTATO chips. Popcorn should be air-popped or cooked in small amounts of liquid vegetable oil. Desserts Eat fruit, low-fat yogurt, and fruit ices instead of pastries, cake, and cookies. Sherbet, angel food cake, gelatin dessert, frozen low-fat yogurt, or other frozen products that do not contain saturated fat (pure fruit juice bars, frozen ice pops) are also acceptable.  COOKING METHODS Choose  those methods that use little or no fat. They include: Poaching.  Braising.  Steaming.  Grilling.  Baking.  Stir-frying.  Broiling.  Microwaving.  Foods can be cooked in a nonstick pan without added fat, or use a nonfat cooking spray in regular cookware. Limit fried foods and avoid frying in saturated fat. Add moisture to lean meats by using water, broth, cooking wines, and other nonfat or low-fat sauces along with the cooking methods mentioned above. Soups and stews should be chilled after cooking. The fat that forms on top after a few hours in the refrigerator should be skimmed off. When preparing meals, avoid using excess salt. Salt can contribute to raising blood pressure in some people.  EATING AWAY FROM  HOME Order entres, potatoes, and vegetables without sauces or butter. When meat exceeds the size of a deck of cards (3 to 4 ounces), the rest can be taken home for another meal. Choose vegetable or fruit salads and ask for low-calorie salad dressings to be served on the side. Use dressings sparingly. Limit high-fat toppings, such as bacon, crumbled eggs, cheese, sunflower seeds, and olives. Ask for heart-healthy tub margarine instead of butter.

## 2012-04-27 NOTE — Progress Notes (Signed)
Reminder in epic to follow up in 2 months with SF in E30 

## 2012-05-08 ENCOUNTER — Ambulatory Visit (INDEPENDENT_AMBULATORY_CARE_PROVIDER_SITE_OTHER): Payer: Medicare Other | Admitting: Family Medicine

## 2012-05-08 ENCOUNTER — Encounter: Payer: Self-pay | Admitting: Family Medicine

## 2012-05-08 VITALS — BP 128/90 | HR 84 | Resp 16 | Ht 62.5 in | Wt 254.0 lb

## 2012-05-08 DIAGNOSIS — R55 Syncope and collapse: Secondary | ICD-10-CM

## 2012-05-08 DIAGNOSIS — T148XXA Other injury of unspecified body region, initial encounter: Secondary | ICD-10-CM

## 2012-05-08 DIAGNOSIS — R7309 Other abnormal glucose: Secondary | ICD-10-CM

## 2012-05-08 DIAGNOSIS — S93409A Sprain of unspecified ligament of unspecified ankle, initial encounter: Secondary | ICD-10-CM

## 2012-05-08 DIAGNOSIS — I10 Essential (primary) hypertension: Secondary | ICD-10-CM

## 2012-05-08 DIAGNOSIS — R7303 Prediabetes: Secondary | ICD-10-CM

## 2012-05-08 NOTE — Patient Instructions (Addendum)
Get the labs today  Wear the boot for 1 week, then start exercising the ankle with the alphabet MRI and carotid dopplers to be scheduled  F/U 4 weeks

## 2012-05-08 NOTE — Progress Notes (Signed)
  Subjective:    Patient ID: Gwendolyn Woodward, female    DOB: Jul 31, 1966, 46 y.o.   MRN: [id number]  HPI Pt here secondary to recurrent syncopal episodes. She has had 2 syncopal episodes in the past 6 weeks. She's a history of syncope and often feels pain. She is unaware when the episodes are going to occur. She was evaluated about 5 months ago by cardiology secondary to chest pain shortness testing and workup was negative for cardiac problems. She denies any recent illness. She's been taking her blood pressure medication as prescribed she states it is actually been running high. When she passed out the last time she injured her ankle was seen in the emergency room diagnosed with right ankle sprain she also hit her right forearm.    Review of Systems  GEN- denies fatigue, fever, weight loss,weakness, recent illness HEENT- denies eye drainage, change in vision, nasal discharge, CVS- denies chest pain, palpitations RESP- denies SOB, cough, wheeze ABD- denies N/V, change in stools, abd pain GU- denies dysuria, hematuria, dribbling, incontinence MSK- denies joint pain, muscle aches, injury Neuro- denies headache, dizziness, syncope, seizure activity      Objective:   Physical Exam GEN- NAD, alert and oriented x3 HEENT- PERRL, EOMI, non injected sclera, pink conjunctiva, MMM, oropharynx clear Neck- Supple, no thryomegaly CVS- RRR, no murmur RESP-CTAB EXT- trace right pedal edema, TTP medial right malleous, decreased ROM right ankle, Right arm- small hematoma palpated on forearm Pulses- Radial, DP- 2+ Neuro-CNII-XII in tact, no focal deficits   EKG-NSR, no ST changes      Assessment & Plan:

## 2012-05-09 DIAGNOSIS — R55 Syncope and collapse: Secondary | ICD-10-CM | POA: Insufficient documentation

## 2012-05-09 DIAGNOSIS — T148XXA Other injury of unspecified body region, initial encounter: Secondary | ICD-10-CM | POA: Insufficient documentation

## 2012-05-09 LAB — CBC
HCT: 43.7 % (ref 36.0–46.0)
Hemoglobin: 13.9 g/dL (ref 12.0–15.0)
MCH: 25 pg — ABNORMAL LOW (ref 26.0–34.0)
MCHC: 31.8 g/dL (ref 30.0–36.0)
MCV: 78.6 fL (ref 78.0–100.0)
Platelets: 235 10*3/uL (ref 150–400)
RBC: 5.56 MIL/uL — ABNORMAL HIGH (ref 3.87–5.11)
RDW: 16 % — ABNORMAL HIGH (ref 11.5–15.5)
WBC: 6.4 10*3/uL (ref 4.0–10.5)

## 2012-05-09 LAB — BASIC METABOLIC PANEL
BUN: 13 mg/dL (ref 6–23)
CO2: 29 mEq/L (ref 19–32)
Calcium: 9.9 mg/dL (ref 8.4–10.5)
Chloride: 104 mEq/L (ref 96–112)
Creat: 0.94 mg/dL (ref 0.50–1.10)
Glucose, Bld: 83 mg/dL (ref 70–99)
Potassium: 5 mEq/L (ref 3.5–5.3)
Sodium: 142 mEq/L (ref 135–145)

## 2012-05-09 LAB — HEMOGLOBIN A1C
Hgb A1c MFr Bld: 6.5 % — ABNORMAL HIGH (ref <5.7)
Mean Plasma Glucose: 140 mg/dL — ABNORMAL HIGH (ref <117)

## 2012-05-09 NOTE — Assessment & Plan Note (Signed)
Oh place her back in the beach for one week and she is having some pain with ambulating. Advise to start range of motion exercises. Xray negative, if still having trouble refer to ortho

## 2012-05-09 NOTE — Assessment & Plan Note (Signed)
I believe is a small hematoma but where she hit her arm after fall. We will watch this

## 2012-05-09 NOTE — Assessment & Plan Note (Signed)
Continue current medication, stable
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
## 2012-05-10 ENCOUNTER — Telehealth: Payer: Self-pay | Admitting: Family Medicine

## 2012-05-10 MED ORDER — TRAMADOL HCL 50 MG PO TABS: 50.0000 mg | ORAL_TABLET | Freq: Four times a day (QID) | ORAL | Status: DC | PRN

## 2012-05-10 NOTE — Telephone Encounter (Signed)
Ultram sent

## 2012-05-10 NOTE — Telephone Encounter (Signed)
Patient aware.

## 2012-05-10 NOTE — Telephone Encounter (Signed)
States you were supposed to send something in for pain. I don't see anything in the notes about it. Please advise.

## 2012-05-15 ENCOUNTER — Ambulatory Visit (HOSPITAL_COMMUNITY): Payer: Medicare Other | Attending: Family Medicine

## 2012-05-15 ENCOUNTER — Ambulatory Visit (HOSPITAL_COMMUNITY): Admission: RE | Admit: 2012-05-15 | Payer: Medicare Other | Source: Ambulatory Visit

## 2012-05-16 ENCOUNTER — Encounter: Payer: Self-pay | Admitting: Family Medicine

## 2012-05-17 ENCOUNTER — Telehealth: Payer: Self-pay

## 2012-05-17 NOTE — Telephone Encounter (Signed)
Patient stopped by and said she had to stop taking the tramadol because they made her have a reaction. Everytime she took one she would break out in little tiny bumps, itch and her heart would race. Hasn't taken one since last Friday. Needs something else sent in for pain  [facility] reids   -Added to allergy list

## 2012-05-18 MED ORDER — HYDROCODONE-ACETAMINOPHEN 7.5-500 MG PO TABS: 1.0000 | ORAL_TABLET | ORAL | Status: DC | PRN

## 2012-05-18 MED ORDER — HYDROCODONE-ACETAMINOPHEN 5-500 MG PO TABS: 1.0000 | ORAL_TABLET | Freq: Four times a day (QID) | ORAL | Status: DC | PRN

## 2012-05-18 NOTE — Telephone Encounter (Addendum)
Script for vicodin sent,no refills, this is short term for ankle pain only Note Vicodin 5-500mg  sent #30 tablets, error in clicking on 7.5/500mg 

## 2012-05-19 NOTE — Telephone Encounter (Signed)
Patient aware.

## 2012-05-22 ENCOUNTER — Encounter: Payer: Self-pay | Admitting: Family Medicine

## 2012-05-22 ENCOUNTER — Ambulatory Visit (INDEPENDENT_AMBULATORY_CARE_PROVIDER_SITE_OTHER): Payer: Medicare Other | Admitting: Family Medicine

## 2012-05-22 VITALS — BP 140/92 | HR 80 | Resp 15 | Ht 62.5 in | Wt 253.4 lb

## 2012-05-22 DIAGNOSIS — G43909 Migraine, unspecified, not intractable, without status migrainosus: Secondary | ICD-10-CM

## 2012-05-22 NOTE — Patient Instructions (Addendum)
Get the MRI done / Carotid arteries ultrasound I will call with results Watch the sugar in your diet, you have diabetes, no koolaid, sweet tea, fast food  F/U 3 months

## 2012-05-24 ENCOUNTER — Encounter: Payer: Self-pay | Admitting: Family Medicine

## 2012-05-24 DIAGNOSIS — G43009 Migraine without aura, not intractable, without status migrainosus: Secondary | ICD-10-CM | POA: Insufficient documentation

## 2012-05-24 NOTE — Progress Notes (Signed)
  Subjective:    Patient ID: Gwendolyn Woodward, female    DOB: [birth year], 46 y.o.   MRN: [id number]  HPI Pt presents for f/u however has missed MRI appt states she did not know about the appt and appt today is early. She states she has not had a syncopal event since our visit 2 weeks ago, but she does have a migraine that started 1 hour ago. No change in vision, no N/V   Review of Systems  GEN- denies fatigue, fever, weight loss,weakness, recent illness HEENT- denies eye drainage, change in vision, nasal discharge, Neuro- + headache, dizziness, syncope, seizure activity      Objective:   Physical Exam GEN-NAD,alert and oriented x 3  HEENT-PERRL fundoscopic exan benign, oropharynx clear Neuro - No focal deficits, CNII-XII        Assessment & Plan:

## 2012-05-24 NOTE — Assessment & Plan Note (Addendum)
She declined abortive treatment. Will reschedule MRI/Carotids for syncope Advised her not to drive if possible until work-up completed

## 2012-05-29 ENCOUNTER — Other Ambulatory Visit: Payer: Self-pay | Admitting: Family Medicine

## 2012-05-29 ENCOUNTER — Ambulatory Visit (HOSPITAL_COMMUNITY)
Admission: RE | Admit: 2012-05-29 | Discharge: 2012-05-29 | Disposition: A | Discharge status: Home / Self Care | Payer: Medicare Other | Source: Ambulatory Visit | Attending: Family Medicine | Admitting: Family Medicine

## 2012-05-29 DIAGNOSIS — Z01818 Encounter for other preprocedural examination: Secondary | ICD-10-CM

## 2012-05-29 DIAGNOSIS — Z8673 Personal history of transient ischemic attack (TIA), and cerebral infarction without residual deficits: Secondary | ICD-10-CM | POA: Insufficient documentation

## 2012-05-29 DIAGNOSIS — R55 Syncope and collapse: Secondary | ICD-10-CM

## 2012-05-30 ENCOUNTER — Encounter: Payer: Self-pay | Admitting: *Deleted

## 2012-05-30 ENCOUNTER — Other Ambulatory Visit: Payer: Self-pay | Admitting: Family Medicine

## 2012-05-30 DIAGNOSIS — R55 Syncope and collapse: Secondary | ICD-10-CM

## 2012-05-30 DIAGNOSIS — Z8673 Personal history of transient ischemic attack (TIA), and cerebral infarction without residual deficits: Secondary | ICD-10-CM

## 2012-08-21 ENCOUNTER — Encounter: Payer: Self-pay | Admitting: Family Medicine

## 2012-08-21 ENCOUNTER — Ambulatory Visit (INDEPENDENT_AMBULATORY_CARE_PROVIDER_SITE_OTHER): Payer: Medicare Other | Admitting: Family Medicine

## 2012-08-21 VITALS — BP 158/82 | HR 86 | Resp 18 | Ht 62.5 in | Wt 258.1 lb

## 2012-08-21 DIAGNOSIS — I10 Essential (primary) hypertension: Secondary | ICD-10-CM

## 2012-08-21 DIAGNOSIS — E669 Obesity, unspecified: Secondary | ICD-10-CM

## 2012-08-21 DIAGNOSIS — R7301 Impaired fasting glucose: Secondary | ICD-10-CM

## 2012-08-21 DIAGNOSIS — R55 Syncope and collapse: Secondary | ICD-10-CM

## 2012-08-21 DIAGNOSIS — G43909 Migraine, unspecified, not intractable, without status migrainosus: Secondary | ICD-10-CM

## 2012-08-21 DIAGNOSIS — R079 Chest pain, unspecified: Secondary | ICD-10-CM

## 2012-08-21 DIAGNOSIS — E119 Type 2 diabetes mellitus without complications: Secondary | ICD-10-CM

## 2012-08-21 LAB — CBC
HCT: 39.5 % (ref 36.0–46.0)
Hemoglobin: 13.1 g/dL (ref 12.0–15.0)
MCH: 25.8 pg — ABNORMAL LOW (ref 26.0–34.0)
MCHC: 33.2 g/dL (ref 30.0–36.0)
MCV: 77.8 fL — ABNORMAL LOW (ref 78.0–100.0)
Platelets: 213 10*3/uL (ref 150–400)
RBC: 5.08 MIL/uL (ref 3.87–5.11)
RDW: 15.5 % (ref 11.5–15.5)
WBC: 6.8 10*3/uL (ref 4.0–10.5)

## 2012-08-21 MED ORDER — BUTALBITAL-APAP-CAFFEINE 50-325-40 MG PO TABS: 1.0000 | ORAL_TABLET | Freq: Four times a day (QID) | ORAL | Status: DC | PRN

## 2012-08-21 MED ORDER — AMLODIPINE BESYLATE 10 MG PO TABS: 10.0000 mg | ORAL_TABLET | Freq: Every day | ORAL | Status: DC

## 2012-08-21 NOTE — Patient Instructions (Addendum)
New referral to Neurologist for the passing out episodes Increase blood pressure medication to 10mg  daily  Get labs done today  F/U 3 months

## 2012-08-22 DIAGNOSIS — E119 Type 2 diabetes mellitus without complications: Secondary | ICD-10-CM | POA: Insufficient documentation

## 2012-08-22 LAB — BASIC METABOLIC PANEL
BUN: 13 mg/dL (ref 6–23)
CO2: 27 mEq/L (ref 19–32)
Calcium: 9.4 mg/dL (ref 8.4–10.5)
Chloride: 104 mEq/L (ref 96–112)
Creat: 0.92 mg/dL (ref 0.50–1.10)
Glucose, Bld: 97 mg/dL (ref 70–99)
Potassium: 4.2 mEq/L (ref 3.5–5.3)
Sodium: 141 mEq/L (ref 135–145)

## 2012-08-22 LAB — HEMOGLOBIN A1C
Hgb A1c MFr Bld: 6.5 % — ABNORMAL HIGH (ref <5.7)
Mean Plasma Glucose: 140 mg/dL — ABNORMAL HIGH (ref <117)

## 2012-08-22 LAB — TSH: TSH: 0.613 u[IU]/mL (ref 0.350–4.500)

## 2012-08-22 MED ORDER — METFORMIN HCL ER 500 MG PO TB24: 500.0000 mg | ORAL_TABLET | Freq: Every day | ORAL | Status: DC

## 2012-08-22 NOTE — Progress Notes (Signed)
  Subjective:    Patient ID: Gwendolyn Woodward, female    DOB: 1966-07-04, 46 y.o.   MRN: [id number]  HPI Pt here to f/u chronic medical problems, missed neurology appt, last syncopal episode in Oct. Has also had headaches on and off, could not take vicodin given, unable to take ultram Had episode of CP a few weeks ago, radiated to right arm for a few second, no SOB, no N/V did not seek care but used 1 NTG, states she can not afford to go to ER or doctors due to her bills    Review of Systems   GEN- denies fatigue, fever, weight loss,weakness, recent illness HEENT- denies eye drainage, change in vision, nasal discharge, CVS- + chest pain, palpitations RESP- denies SOB, cough, wheeze ABD- denies N/V, change in stools, abd pain GU- denies dysuria, hematuria, dribbling, incontinence MSK- denies joint pain, muscle aches, injury Neuro- + headache,+ dizziness, syncope, seizure activity      Objective:   Physical Exam GEN- NAD, alert and oriented x3 HEENT- PERRL, EOMI, non injected sclera, pink conjunctiva, MMM, oropharynx clear CVS- RRR, no murmur RESP-CTAB EXT- no edema Pulses- Radial, DP- 2+ Neuro-CNII-XII in tact, no new focal deficits, decreased strength left side   EKG-NSR, no ST changes        Assessment & Plan:

## 2012-08-22 NOTE — Assessment & Plan Note (Signed)
Last A1C 6.5%,

## 2012-08-22 NOTE — Assessment & Plan Note (Signed)
Patient does not have any aura prior to the syncopal events. She has been evaluated by cardiology in the past and had normal workup. She does have history of CVA. Cartoid Dopplers normal, MRI unremarkable, reschedule with neurology

## 2012-08-22 NOTE — Assessment & Plan Note (Signed)
Referral to neurology, try her on firocet  she has also been using a large amount of goody powders Consider Topamax once seen by neurology

## 2012-08-22 NOTE — Assessment & Plan Note (Signed)
Uncontrolled, increase norvasc to 5mg 

## 2012-08-22 NOTE — Assessment & Plan Note (Signed)
Evaluated by cardiology,stress echo, atypical CP, EKG normal, she is highly anxious patient as well

## 2012-08-22 NOTE — Assessment & Plan Note (Signed)
A1C have been trending up, repeat A1C 6.5% will start glucaphage 500mg  daily, stress diet and weight loss

## 2012-08-22 NOTE — Assessment & Plan Note (Signed)
Weight gain noted, discussed need for diet and exercise

## 2012-08-25 ENCOUNTER — Telehealth: Payer: Self-pay | Admitting: Family Medicine

## 2012-08-25 NOTE — Telephone Encounter (Signed)
Call pt to give lab results, her A1C which is a diabetes number is still 6.5%, she needs to start the metformin once a day this is at her pharmacy, watch her sugar intake, avoid soda, koolaid, fruit punch  Other labs okay Left voice mail for pt to return call

## 2012-08-28 NOTE — Telephone Encounter (Signed)
Patient aware.

## 2012-08-30 ENCOUNTER — Ambulatory Visit (INDEPENDENT_AMBULATORY_CARE_PROVIDER_SITE_OTHER): Payer: Medicare Other | Admitting: Family Medicine

## 2012-08-30 ENCOUNTER — Encounter: Payer: Self-pay | Admitting: Family Medicine

## 2012-08-30 VITALS — BP 126/82 | HR 83 | Temp 98.8°F | Resp 18 | Ht 62.5 in | Wt 253.0 lb

## 2012-08-30 DIAGNOSIS — J209 Acute bronchitis, unspecified: Secondary | ICD-10-CM

## 2012-08-30 DIAGNOSIS — J45901 Unspecified asthma with (acute) exacerbation: Secondary | ICD-10-CM

## 2012-08-30 DIAGNOSIS — J0110 Acute frontal sinusitis, unspecified: Secondary | ICD-10-CM

## 2012-08-30 DIAGNOSIS — E669 Obesity, unspecified: Secondary | ICD-10-CM

## 2012-08-30 DIAGNOSIS — I10 Essential (primary) hypertension: Secondary | ICD-10-CM

## 2012-08-30 DIAGNOSIS — E119 Type 2 diabetes mellitus without complications: Secondary | ICD-10-CM

## 2012-08-30 MED ORDER — CEFTRIAXONE SODIUM 1 G IJ SOLR
500.0000 mg | Freq: Once | INTRAMUSCULAR | Status: AC
  Administered 2012-08-30: 500 mg via INTRAMUSCULAR

## 2012-08-30 MED ORDER — PREDNISONE (PAK) 5 MG PO TABS: 5.0000 mg | ORAL_TABLET | ORAL | Status: DC

## 2012-08-30 MED ORDER — BENZONATATE 100 MG PO CAPS: 100.0000 mg | ORAL_CAPSULE | Freq: Four times a day (QID) | ORAL | Status: DC | PRN

## 2012-08-30 MED ORDER — FLUCONAZOLE 150 MG PO TABS: ORAL_TABLET | ORAL | Status: DC

## 2012-08-30 MED ORDER — PENICILLIN V POTASSIUM 500 MG PO TABS: 500.0000 mg | ORAL_TABLET | Freq: Three times a day (TID) | ORAL | Status: DC

## 2012-08-30 MED ORDER — METHYLPREDNISOLONE ACETATE 80 MG/ML IJ SUSP
80.0000 mg | Freq: Once | INTRAMUSCULAR | Status: AC
  Administered 2012-08-30: 80 mg via INTRAMUSCULAR

## 2012-08-30 MED ORDER — IPRATROPIUM BROMIDE 0.02 % IN SOLN
0.5000 mg | Freq: Once | RESPIRATORY_TRACT | Status: AC
  Administered 2012-08-30: 0.5 mg via RESPIRATORY_TRACT

## 2012-08-30 MED ORDER — ALBUTEROL SULFATE (5 MG/ML) 0.5% IN NEBU
2.5000 mg | INHALATION_SOLUTION | Freq: Once | RESPIRATORY_TRACT | Status: AC
  Administered 2012-08-30: 2.5 mg via RESPIRATORY_TRACT

## 2012-08-30 NOTE — Assessment & Plan Note (Signed)
Controlled, no change in medication  

## 2012-08-30 NOTE — Assessment & Plan Note (Signed)
Importance of med compliance stressed, pt to start meds still has not collected, reports the diagnosis is new. Will sched f/u with PCP in 3 to 4 weeks

## 2012-08-30 NOTE — Assessment & Plan Note (Signed)
Uncontrolled with flare likely due to current infection Depo medrol in office followed by prednisone dose pack

## 2012-08-30 NOTE — Patient Instructions (Addendum)
F/U with Dr Jeanice Lim in the next 4 weeks. Please call if you need to see her sooner  You are being treated for acute bronchitis , acute sinusit,is with asthma flare.  Neb treatment and rocephin and depo medrol in office.  Meds sent to the pharmacy

## 2012-08-30 NOTE — Addendum Note (Signed)
Addended by: Kandis Fantasia B on: 08/30/2012 02:12 PM   Modules accepted: Orders

## 2012-08-30 NOTE — Assessment & Plan Note (Addendum)
Improved

## 2012-08-30 NOTE — Assessment & Plan Note (Signed)
Acute symptom onset, symptomatic and high risk for complications, aggressive antibiotic course and neb treatment

## 2012-08-30 NOTE — Progress Notes (Signed)
  Subjective:    Patient ID: Gwendolyn Woodward, female    DOB: 1965/12/08, 47 y.o.   MRN: [id number]  HPI 3 day h/o head congestion , sore throat, cough sinus pressure, yellow nasal drainage and yellow sputum. No documented fever, but has had chills. She has also experienced increased wheezing and shortness of breath , she is asthmatic and has not had the flu vacccine. She feels weak Newly diagnosed diabetic, still has  not started medication    Review of Systems See HPI  Denies chest pains, palpitations and leg swelling Denies abdominal pain, nausea, vomiting,diarrhea or constipation.   Denies dysuria, frequency, hesitancy or incontinence. Denies joint pain, swelling and limitation in mobility. Denies headaches, seizures, numbness, or tingling. Denies depression, anxiety or insomnia. Denies skin break down or rash.        Objective:   Physical Exam  Patient alert and oriented and in no cardiopulmonary distress.  HEENT: No facial asymmetry, EOMI, frontal  sinus tenderness,  oropharynx pink and moist.  Neck supple anterior cervical  adenopathy.  Chest: decreased air entry, scattered crackles, and wheezes CVS: S1, S2 no murmurs, no S3.  ABD: Soft non tender. Bowel sounds normal.  Ext: No edema  MS: Adequate ROM spine, shoulders, hips and knees.  Skin: Intact, no ulcerations or rash noted.  Psych: Good eye contact, normal affect. Memory intact not anxious or depressed appearing.  CNS: CN 2-12 intact, power, tone and sensation normal throughout.       Assessment & Plan:

## 2012-08-30 NOTE — Assessment & Plan Note (Signed)
Acute infection antibiotic prescribed

## 2012-09-12 ENCOUNTER — Encounter: Payer: Self-pay | Admitting: Family Medicine

## 2012-09-23 ENCOUNTER — Encounter (HOSPITAL_COMMUNITY): Payer: Self-pay | Admitting: *Deleted

## 2012-09-23 ENCOUNTER — Emergency Department (HOSPITAL_COMMUNITY)
Admission: EM | Admit: 2012-09-23 | Discharge: 2012-09-23 | Disposition: A | Discharge status: Home / Self Care | Payer: Medicare Other | Attending: Emergency Medicine | Admitting: Emergency Medicine

## 2012-09-23 DIAGNOSIS — Z862 Personal history of diseases of the blood and blood-forming organs and certain disorders involving the immune mechanism: Secondary | ICD-10-CM | POA: Insufficient documentation

## 2012-09-23 DIAGNOSIS — Y9389 Activity, other specified: Secondary | ICD-10-CM | POA: Insufficient documentation

## 2012-09-23 DIAGNOSIS — Z23 Encounter for immunization: Secondary | ICD-10-CM | POA: Insufficient documentation

## 2012-09-23 DIAGNOSIS — Z79899 Other long term (current) drug therapy: Secondary | ICD-10-CM | POA: Insufficient documentation

## 2012-09-23 DIAGNOSIS — Z8679 Personal history of other diseases of the circulatory system: Secondary | ICD-10-CM | POA: Insufficient documentation

## 2012-09-23 DIAGNOSIS — Y929 Unspecified place or not applicable: Secondary | ICD-10-CM | POA: Insufficient documentation

## 2012-09-23 DIAGNOSIS — Z8673 Personal history of transient ischemic attack (TIA), and cerebral infarction without residual deficits: Secondary | ICD-10-CM | POA: Insufficient documentation

## 2012-09-23 DIAGNOSIS — R079 Chest pain, unspecified: Secondary | ICD-10-CM | POA: Insufficient documentation

## 2012-09-23 DIAGNOSIS — Z8659 Personal history of other mental and behavioral disorders: Secondary | ICD-10-CM | POA: Insufficient documentation

## 2012-09-23 DIAGNOSIS — W268XXA Contact with other sharp object(s), not elsewhere classified, initial encounter: Secondary | ICD-10-CM | POA: Insufficient documentation

## 2012-09-23 DIAGNOSIS — Z8739 Personal history of other diseases of the musculoskeletal system and connective tissue: Secondary | ICD-10-CM | POA: Insufficient documentation

## 2012-09-23 DIAGNOSIS — J45909 Unspecified asthma, uncomplicated: Secondary | ICD-10-CM | POA: Insufficient documentation

## 2012-09-23 DIAGNOSIS — S91309A Unspecified open wound, unspecified foot, initial encounter: Secondary | ICD-10-CM | POA: Insufficient documentation

## 2012-09-23 DIAGNOSIS — Z8639 Personal history of other endocrine, nutritional and metabolic disease: Secondary | ICD-10-CM | POA: Insufficient documentation

## 2012-09-23 DIAGNOSIS — S91339A Puncture wound without foreign body, unspecified foot, initial encounter: Secondary | ICD-10-CM

## 2012-09-23 DIAGNOSIS — R51 Headache: Secondary | ICD-10-CM | POA: Insufficient documentation

## 2012-09-23 DIAGNOSIS — K219 Gastro-esophageal reflux disease without esophagitis: Secondary | ICD-10-CM | POA: Insufficient documentation

## 2012-09-23 DIAGNOSIS — I10 Essential (primary) hypertension: Secondary | ICD-10-CM | POA: Insufficient documentation

## 2012-09-23 MED ORDER — CIPROFLOXACIN HCL 500 MG PO TABS: 500.0000 mg | ORAL_TABLET | Freq: Two times a day (BID) | ORAL | Status: DC

## 2012-09-23 MED ORDER — TETANUS-DIPHTH-ACELL PERTUSSIS 5-2.5-18.5 LF-MCG/0.5 IM SUSP
INTRAMUSCULAR | Status: AC
  Administered 2012-09-23: 0.5 mL via INTRAMUSCULAR
  Filled 2012-09-23: qty 0.5

## 2012-09-23 MED ORDER — TETANUS-DIPHTH-ACELL PERTUSSIS 5-2.5-18.5 LF-MCG/0.5 IM SUSP
0.5000 mL | Freq: Once | INTRAMUSCULAR | Status: AC
  Administered 2012-09-23: 0.5 mL via INTRAMUSCULAR

## 2012-09-23 NOTE — ED Notes (Signed)
Pt states she stepped on a rusty nail one hour PTA. Unsure of last tetanus shot

## 2012-09-23 NOTE — ED Notes (Signed)
Patient with no complaints at this time. Respirations even and unlabored. Skin warm/dry. Discharge instructions reviewed with patient at this time. Patient given opportunity to voice concerns/ask questions. Patient discharged at this time and left Emergency Department with steady gait.   

## 2012-09-23 NOTE — ED Provider Notes (Signed)
History     CSN: [id number]  Arrival date & time 09/23/12  1507   None     Chief Complaint  Patient presents with  . Foot Injury    HPI Gwendolyn Woodward is a 47 y.o. female who presents to the ED with a puncture wound. The injury is located on the left lateral foot. Patient states she stepped on a nail just prior to coming to the ED. She complains of pain at the puncture site. She cleaned the wound and soaked in salt water.  She is unsure of her last tetanus shot. The history was provided by the patient.  Past Medical History  Diagnosis Date  . Hypertension     Lab 09/2011: Normal CMet except glucose of 109-169 and albumin-3.4; normal BP 09/2011-12/2011  . Asthma   . CVA (cerebral infarction) 1984 , 1988    x2, left sided weakness, history of dizziness  . Sleep apnea     Does not use CPAP - no longer has CPAP machine  . History of anemia     S/p transfusions in 1999 at [facility] after vaginal bleeding; normal CBC and 09/2011  . Gastroesophageal reflux disease     no meds  . Headache     OTC med PRN  . Arthritis     back pain and knee pain, no meds  . Depression with anxiety     History- no meds  . Chest pain     + dyspnea  . Dysphagia   . Hyperlipidemia     lipid profile in 11/2011: 203, 106, 58, 124  . Fasting hyperglycemia   . Stroke     Past Surgical History  Procedure Date  . Cesarean section 1984, 1987    x 2  . Wisdom tooth extraction   . Tubal ligation   . Iud removal 10/20/2011    Procedure: INTRAUTERINE DEVICE (IUD) REMOVAL;  Surgeon: Leslie Andrea, MD;  Location: [facility];  Service: Gynecology;  Laterality: N/A;  . Endometrial ablation Feb 2013  . Esophagogastroduodenoscopy 5/11    [facility]-Dr Eliott Nine GERD distal esophagus, NEGATIVE bx for Barretts  . Colonoscopy 01/27/2012    Procedure: COLONOSCOPY;  Surgeon: West Bali, MD;  Location: [facility];  Service: Endoscopy;  Laterality: N/A;  8:30    Family History  Problem Relation Age of  Onset  . Anesthesia problems Neg Hx   . Hypertension Mother   . Asthma Mother   . Cancer Father     Lung/Throat  . Heart disease Father   . Diabetes Maternal Uncle     History  Substance Use Topics  . Smoking status: Never Smoker   . Smokeless tobacco: Never Used  . Alcohol Use: Yes     Comment: Socially, 2 times per yr    OB History    Grav Para Term Preterm Abortions TAB SAB Ect Mult Living   2 2 2  0 0 0 0 0 0 2      Review of Systems  Constitutional: Negative for fever and chills.  HENT: Negative.   Respiratory: Negative for cough and wheezing.   Cardiovascular: Negative for chest pain.  Gastrointestinal: Negative for abdominal pain.  Skin: Positive for wound.       Left foot  Psychiatric/Behavioral: The patient is not nervous/anxious.     Allergies  Aspirin; Hydrocodone; and Tramadol  Home Medications   Current Outpatient Rx  Name  Route  Sig  Dispense  Refill  .  ALBUTEROL SULFATE HFA 108 (90 BASE) MCG/ACT IN AERS   Inhalation   Inhale 2 puffs into the lungs every 6 (six) hours as needed. Takes for shortness of breath         . AMLODIPINE BESYLATE 10 MG PO TABS   Oral   Take 1 tablet (10 mg total) by mouth daily.   30 tablet   3     New dose   . METFORMIN HCL ER 500 MG PO TB24   Oral   Take 1 tablet (500 mg total) by mouth daily with breakfast.   30 tablet   3   . FISH OIL 1000 MG PO CAPS   Oral   Take 1 capsule by mouth daily.          Marland Kitchen OMEPRAZOLE 20 MG PO CPDR   Oral   Take 1 capsule (20 mg total) by mouth 2 (two) times daily.   62 capsule   3     Dose increase   . BUTALBITAL-APAP-CAFFEINE 50-325-40 MG PO TABS   Oral   Take 1 tablet by mouth every 6 (six) hours as needed for headache.   30 tablet   2   . NITROGLYCERIN 0.4 MG SL SUBL   Sublingual   Place 1 tablet (0.4 mg total) under the tongue every 5 (five) minutes as needed for chest pain.   25 tablet   3     BP 132/83  Pulse 98  Temp 98 F (36.7 C) (Oral)  Resp 18   Ht 5\' 1"  (1.549 m)  Wt 258 lb (117.028 kg)  BMI 48.75 kg/m2  SpO2 99%  Physical Exam  Constitutional: She is oriented to person, place, and time. No distress.       Obese, A/A female  HENT:  Head: Normocephalic.  Neck: Neck supple.  Cardiovascular: Normal rate.   Pulmonary/Chest: Effort normal.  Musculoskeletal: Normal range of motion.       Feet:       Tender with exam at puncture site, no drainage or erythema.  Neurological: She is alert and oriented to person, place, and time.  Skin:       Puncture wound left foot  Psychiatric: She has a normal mood and affect. Her behavior is normal.   Procedures   Assessment: 47 y.o. female with puncture wound to left foot  Plan:  Discussed with patient in detail need for antibiotics and follow up since she is a diabetic    She will follow up with her doctor on Monday or return here for worsening symptoms  Discussed with the patient and all questioned fully answered. She will return if any problems arise.    Medication List     As of 09/23/2012  4:15 PM    START taking these medications         ciprofloxacin 500 MG tablet   Commonly known as: CIPRO   Take 1 tablet (500 mg total) by mouth every 12 (twelve) hours.      ASK your doctor about these medications         albuterol 108 (90 BASE) MCG/ACT inhaler   Commonly known as: PROVENTIL HFA;VENTOLIN HFA      amLODipine 10 MG tablet   Commonly known as: NORVASC   Take 1 tablet (10 mg total) by mouth daily.      butalbital-acetaminophen-caffeine 50-325-40 MG per tablet   Commonly known as: FIORICET, ESGIC   Take 1 tablet by mouth every 6 (six) hours as  needed for headache.      Fish Oil 1000 MG Caps      metFORMIN 500 MG 24 hr tablet   Commonly known as: GLUCOPHAGE-XR   Take 1 tablet (500 mg total) by mouth daily with breakfast.      nitroGLYCERIN 0.4 MG SL tablet   Commonly known as: NITROSTAT   Place 1 tablet (0.4 mg total) under the tongue every 5 (five) minutes as  needed for chest pain.      omeprazole 20 MG capsule   Commonly known as: PRILOSEC   Take 1 capsule (20 mg total) by mouth 2 (two) times daily.          Where to get your medications    These are the prescriptions that you need to pick up.   You may get these medications from any pharmacy.         ciprofloxacin 500 MG tablet               [facility] [place] 09/23/12 1615

## 2012-09-24 NOTE — ED Provider Notes (Signed)
Medical screening examination/treatment/procedure(s) were performed by non-physician practitioner and as supervising physician I was immediately available for consultation/collaboration.   Lyanne Co, MD 09/24/12 [phone]

## 2012-09-26 ENCOUNTER — Ambulatory Visit: Payer: Medicare Other | Admitting: Family Medicine

## 2012-11-20 ENCOUNTER — Ambulatory Visit: Payer: Medicare Other | Admitting: Family Medicine

## 2012-12-13 ENCOUNTER — Encounter (HOSPITAL_COMMUNITY): Payer: Self-pay

## 2012-12-13 ENCOUNTER — Emergency Department (HOSPITAL_COMMUNITY)
Admission: EM | Admit: 2012-12-13 | Discharge: 2012-12-13 | Disposition: A | Discharge status: Home / Self Care | Payer: No Typology Code available for payment source | Attending: Emergency Medicine | Admitting: Emergency Medicine

## 2012-12-13 ENCOUNTER — Emergency Department (HOSPITAL_COMMUNITY): Payer: No Typology Code available for payment source

## 2012-12-13 DIAGNOSIS — Z8659 Personal history of other mental and behavioral disorders: Secondary | ICD-10-CM | POA: Insufficient documentation

## 2012-12-13 DIAGNOSIS — Z8673 Personal history of transient ischemic attack (TIA), and cerebral infarction without residual deficits: Secondary | ICD-10-CM | POA: Insufficient documentation

## 2012-12-13 DIAGNOSIS — J45901 Unspecified asthma with (acute) exacerbation: Secondary | ICD-10-CM | POA: Insufficient documentation

## 2012-12-13 DIAGNOSIS — Z8679 Personal history of other diseases of the circulatory system: Secondary | ICD-10-CM | POA: Insufficient documentation

## 2012-12-13 DIAGNOSIS — Z8669 Personal history of other diseases of the nervous system and sense organs: Secondary | ICD-10-CM | POA: Insufficient documentation

## 2012-12-13 DIAGNOSIS — Z79899 Other long term (current) drug therapy: Secondary | ICD-10-CM | POA: Insufficient documentation

## 2012-12-13 DIAGNOSIS — Z8639 Personal history of other endocrine, nutritional and metabolic disease: Secondary | ICD-10-CM | POA: Insufficient documentation

## 2012-12-13 DIAGNOSIS — Y9389 Activity, other specified: Secondary | ICD-10-CM | POA: Insufficient documentation

## 2012-12-13 DIAGNOSIS — Z862 Personal history of diseases of the blood and blood-forming organs and certain disorders involving the immune mechanism: Secondary | ICD-10-CM | POA: Insufficient documentation

## 2012-12-13 DIAGNOSIS — S335XXA Sprain of ligaments of lumbar spine, initial encounter: Secondary | ICD-10-CM | POA: Insufficient documentation

## 2012-12-13 DIAGNOSIS — M129 Arthropathy, unspecified: Secondary | ICD-10-CM | POA: Insufficient documentation

## 2012-12-13 DIAGNOSIS — Z8719 Personal history of other diseases of the digestive system: Secondary | ICD-10-CM | POA: Insufficient documentation

## 2012-12-13 DIAGNOSIS — Y92410 Unspecified street and highway as the place of occurrence of the external cause: Secondary | ICD-10-CM | POA: Insufficient documentation

## 2012-12-13 DIAGNOSIS — I10 Essential (primary) hypertension: Secondary | ICD-10-CM | POA: Insufficient documentation

## 2012-12-13 DIAGNOSIS — T07XXXA Unspecified multiple injuries, initial encounter: Secondary | ICD-10-CM

## 2012-12-13 MED ORDER — METHOCARBAMOL 500 MG PO TABS: 500.0000 mg | ORAL_TABLET | Freq: Three times a day (TID) | ORAL | Status: DC

## 2012-12-13 MED ORDER — HYDROXYZINE HCL 25 MG PO TABS: 25.0000 mg | ORAL_TABLET | Freq: Four times a day (QID) | ORAL | Status: DC

## 2012-12-13 MED ORDER — OXYCODONE-ACETAMINOPHEN 5-325 MG PO TABS: 1.0000 | ORAL_TABLET | Freq: Four times a day (QID) | ORAL | Status: DC | PRN
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
## 2012-12-13 NOTE — ED Notes (Signed)
nad noted prior to dc. Dc instructions reviewed and explained. 3 scripts given to pt. Pt ambulated out withotu difficulty. F/u appt discussed if needed.
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
## 2012-12-14 NOTE — ED Provider Notes (Signed)
Medical screening examination/treatment/procedure(s) were performed by non-physician practitioner and as supervising physician I was immediately available for consultation/collaboration.   Shadae Reino, MD 12/14/12 1429 

## 2012-12-19 ENCOUNTER — Ambulatory Visit: Payer: Medicare Other | Admitting: Family Medicine

## 2012-12-21 ENCOUNTER — Ambulatory Visit (INDEPENDENT_AMBULATORY_CARE_PROVIDER_SITE_OTHER): Payer: Medicare Other | Admitting: Family Medicine

## 2012-12-21 ENCOUNTER — Encounter: Payer: Self-pay | Admitting: Family Medicine

## 2012-12-21 DIAGNOSIS — M542 Cervicalgia: Secondary | ICD-10-CM | POA: Insufficient documentation

## 2012-12-21 DIAGNOSIS — M545 Low back pain, unspecified: Secondary | ICD-10-CM | POA: Insufficient documentation

## 2012-12-21 DIAGNOSIS — M549 Dorsalgia, unspecified: Secondary | ICD-10-CM

## 2012-12-21 MED ORDER — NAPROXEN 500 MG PO TABS: 500.0000 mg | ORAL_TABLET | Freq: Two times a day (BID) | ORAL | Status: DC

## 2012-12-21 NOTE — Assessment & Plan Note (Signed)
Will add anti-inflammatory to her regimen she will see orthopedics today for further evaluation
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
## 2012-12-27 ENCOUNTER — Ambulatory Visit (HOSPITAL_COMMUNITY)
Admission: RE | Admit: 2012-12-27 | Discharge: 2012-12-27 | Disposition: A | Discharge status: Home / Self Care | Payer: Medicare Other | Source: Ambulatory Visit | Attending: Orthopedic Surgery | Admitting: Orthopedic Surgery

## 2012-12-27 DIAGNOSIS — M545 Low back pain, unspecified: Secondary | ICD-10-CM | POA: Insufficient documentation

## 2012-12-27 DIAGNOSIS — G8929 Other chronic pain: Secondary | ICD-10-CM | POA: Insufficient documentation

## 2012-12-27 DIAGNOSIS — M542 Cervicalgia: Secondary | ICD-10-CM | POA: Insufficient documentation

## 2012-12-27 DIAGNOSIS — M6281 Muscle weakness (generalized): Secondary | ICD-10-CM | POA: Insufficient documentation

## 2012-12-27 DIAGNOSIS — IMO0001 Reserved for inherently not codable concepts without codable children: Secondary | ICD-10-CM | POA: Insufficient documentation
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
## 2013-01-03 ENCOUNTER — Ambulatory Visit (HOSPITAL_COMMUNITY): Payer: Medicare Other | Admitting: Physical Therapy

## 2013-01-04 ENCOUNTER — Inpatient Hospital Stay (HOSPITAL_COMMUNITY): Admission: RE | Admit: 2013-01-04 | Payer: Medicare Other | Source: Ambulatory Visit | Admitting: Physical Therapy

## 2013-01-10 ENCOUNTER — Telehealth (HOSPITAL_COMMUNITY): Payer: Self-pay

## 2013-01-11 ENCOUNTER — Telehealth (HOSPITAL_COMMUNITY): Payer: Self-pay

## 2013-01-11 ENCOUNTER — Ambulatory Visit (HOSPITAL_COMMUNITY): Payer: Medicare Other

## 2013-01-12 ENCOUNTER — Ambulatory Visit (HOSPITAL_COMMUNITY): Payer: Medicare Other

## 2013-01-16 ENCOUNTER — Ambulatory Visit (HOSPITAL_COMMUNITY): Payer: Medicare Other | Admitting: Physical Therapy

## 2013-01-18 ENCOUNTER — Ambulatory Visit (HOSPITAL_COMMUNITY): Payer: Medicare Other | Admitting: Physical Therapy

## 2013-01-19 ENCOUNTER — Telehealth: Payer: Self-pay | Admitting: Family Medicine

## 2013-01-19 DIAGNOSIS — E119 Type 2 diabetes mellitus without complications: Secondary | ICD-10-CM

## 2013-01-19 MED ORDER — METFORMIN HCL ER 500 MG PO TB24: 500.0000 mg | ORAL_TABLET | Freq: Every day | ORAL | Status: DC

## 2013-01-19 MED ORDER — NITROGLYCERIN 0.4 MG SL SUBL: 0.4000 mg | SUBLINGUAL_TABLET | SUBLINGUAL | Status: DC | PRN

## 2013-01-19 MED ORDER — AMLODIPINE BESYLATE 10 MG PO TABS: 10.0000 mg | ORAL_TABLET | Freq: Every day | ORAL | Status: DC

## 2013-01-19 NOTE — Telephone Encounter (Signed)
meds given

## 2013-02-05 ENCOUNTER — Encounter: Payer: Self-pay | Admitting: Family Medicine

## 2013-02-05 ENCOUNTER — Ambulatory Visit (INDEPENDENT_AMBULATORY_CARE_PROVIDER_SITE_OTHER): Payer: Medicare Other | Admitting: Family Medicine

## 2013-02-05 VITALS — BP 120/70 | HR 82 | Temp 97.1°F | Resp 16 | Ht 61.0 in | Wt 240.0 lb

## 2013-02-05 DIAGNOSIS — E669 Obesity, unspecified: Secondary | ICD-10-CM

## 2013-02-05 DIAGNOSIS — L84 Corns and callosities: Secondary | ICD-10-CM | POA: Insufficient documentation

## 2013-02-05 DIAGNOSIS — G4730 Sleep apnea, unspecified: Secondary | ICD-10-CM

## 2013-02-05 DIAGNOSIS — I10 Essential (primary) hypertension: Secondary | ICD-10-CM

## 2013-02-05 DIAGNOSIS — E119 Type 2 diabetes mellitus without complications: Secondary | ICD-10-CM

## 2013-02-05 DIAGNOSIS — G4700 Insomnia, unspecified: Secondary | ICD-10-CM | POA: Insufficient documentation

## 2013-02-05 LAB — LIPID PANEL
Cholesterol: 181 mg/dL (ref 0–200)
HDL: 58 mg/dL (ref >39)
LDL Cholesterol: 103 mg/dL — ABNORMAL HIGH (ref 0–99)
Total CHOL/HDL Ratio: 3.1 Ratio
Triglycerides: 98 mg/dL (ref <150)
VLDL: 20 mg/dL (ref 0–40)

## 2013-02-05 LAB — CBC
HCT: 41.7 % (ref 36.0–46.0)
Hemoglobin: 13.9 g/dL (ref 12.0–15.0)
MCH: 26.2 pg (ref 26.0–34.0)
MCHC: 33.3 g/dL (ref 30.0–36.0)
MCV: 78.7 fL (ref 78.0–100.0)
Platelets: 217 10*3/uL (ref 150–400)
RBC: 5.3 MIL/uL — ABNORMAL HIGH (ref 3.87–5.11)
RDW: 15.4 % (ref 11.5–15.5)
WBC: 5.2 10*3/uL (ref 4.0–10.5)

## 2013-02-05 LAB — COMPREHENSIVE METABOLIC PANEL
ALT: 15 U/L (ref 0–35)
AST: 12 U/L (ref 0–37)
Albumin: 4 g/dL (ref 3.5–5.2)
Alkaline Phosphatase: 92 U/L (ref 39–117)
BUN: 19 mg/dL (ref 6–23)
CO2: 26 mEq/L (ref 19–32)
Calcium: 9.8 mg/dL (ref 8.4–10.5)
Chloride: 105 mEq/L (ref 96–112)
Creat: 0.92 mg/dL (ref 0.50–1.10)
Glucose, Bld: 111 mg/dL — ABNORMAL HIGH (ref 70–99)
Potassium: 4.6 mEq/L (ref 3.5–5.3)
Sodium: 139 mEq/L (ref 135–145)
Total Bilirubin: 0.4 mg/dL (ref 0.3–1.2)
Total Protein: 7 g/dL (ref 6.0–8.3)

## 2013-02-05 LAB — HEMOGLOBIN A1C
Hgb A1c MFr Bld: 6 % — ABNORMAL HIGH (ref <5.7)
Mean Plasma Glucose: 126 mg/dL — ABNORMAL HIGH (ref <117)

## 2013-02-05 LAB — MICROALBUMIN / CREATININE URINE RATIO
Creatinine, Urine: 549.9 mg/dL
Microalb Creat Ratio: 2.7 mg/g (ref 0.0–30.0)
Microalb, Ur: 1.46 mg/dL (ref 0.00–1.89)

## 2013-02-05 MED ORDER — TRAZODONE HCL 50 MG PO TABS: 50.0000 mg | ORAL_TABLET | Freq: Every day | ORAL | Status: DC

## 2013-02-05 MED ORDER — LISINOPRIL 10 MG PO TABS: 10.0000 mg | ORAL_TABLET | Freq: Every day | ORAL | Status: DC

## 2013-02-05 NOTE — Assessment & Plan Note (Signed)
This is likley contributing to fatigue as well, however pt declines use of CPAP machine

## 2013-02-05 NOTE — Assessment & Plan Note (Signed)
Weight loss noted, continue working on diet

## 2013-02-05 NOTE — Patient Instructions (Addendum)
We will call with lab results Continue current medications Complete the norvasc, when it runs out , get the lisinopril  F/U 3 months

## 2013-02-05 NOTE — Assessment & Plan Note (Signed)
Check A1C, will change to ACEI Continue to encourage healthy eating and exercise Urine Micro Podiatry exam

## 2013-02-05 NOTE — Progress Notes (Signed)
  Subjective:    Patient ID: Gwendolyn Woodward, female    DOB: Dec 05, 1965, 47 y.o.   MRN: [id number]  HPI  Pt here to f/u chronic medical problems. Medications reviewed Complains of fatigue and insomnia, worse over past 6 months, but has had for years. Tries to go to bed pm at 7pm because she is bored, does not nap during day, denies caffiene at betime, unable to fall asleep or stay asleep. Does not use CPAP  For years due to discomfort and not able to sleep long enough to wear machine DM- taking medication, last A1C 6.4% no hypoglycemic symptoms HTN- no difficult with meds Review of Systems  GEN- + fatigue, fever, weight loss,weakness, recent illness HEENT- denies eye drainage, change in vision, nasal discharge, CVS- denies chest pain, palpitations RESP- denies SOB, cough, wheeze ABD- denies N/V, change in stools, abd pain GU- denies dysuria, hematuria, dribbling, incontinence MSK- denies joint pain, muscle aches, injury Neuro- denies headache, dizziness, syncope, seizure activity      Objective:   Physical Exam GEN- NAD, alert and oriented x3, obese HEENT- PERRL, EOMI, non injected sclera, pink conjunctiva, MMM, oropharynx clear Neck- Supple, no thyromegaly CVS- RRR, no murmur RESP-CTAB EXT- No edema Pulses- Radial, DP- 2+        Assessment & Plan:

## 2013-02-05 NOTE — Assessment & Plan Note (Signed)
Trail of trazodone, discussed proper sleep hygiene

## 2013-02-05 NOTE — Assessment & Plan Note (Signed)
Well controlled D/c norvasc, start lisinopril due to DM

## 2013-02-08 MED ORDER — PRAVASTATIN SODIUM 10 MG PO TABS: 10.0000 mg | ORAL_TABLET | Freq: Every day | ORAL | Status: DC

## 2013-02-08 NOTE — Addendum Note (Signed)
Addended by: Milinda Antis F on: 02/08/2013 11:03 AM   Modules accepted: Orders

## 2013-05-01 ENCOUNTER — Ambulatory Visit (INDEPENDENT_AMBULATORY_CARE_PROVIDER_SITE_OTHER): Payer: Medicare Other | Admitting: Family Medicine

## 2013-05-01 VITALS — BP 162/80 | HR 73 | Temp 97.9°F | Resp 18 | Wt 238.0 lb

## 2013-05-01 DIAGNOSIS — I10 Essential (primary) hypertension: Secondary | ICD-10-CM

## 2013-05-01 DIAGNOSIS — E119 Type 2 diabetes mellitus without complications: Secondary | ICD-10-CM

## 2013-05-01 DIAGNOSIS — E785 Hyperlipidemia, unspecified: Secondary | ICD-10-CM

## 2013-05-01 DIAGNOSIS — L980 Pyogenic granuloma: Secondary | ICD-10-CM

## 2013-05-01 MED ORDER — METFORMIN HCL ER 500 MG PO TB24: 500.0000 mg | ORAL_TABLET | Freq: Every day | ORAL | Status: DC

## 2013-05-01 MED ORDER — LISINOPRIL 10 MG PO TABS: 10.0000 mg | ORAL_TABLET | Freq: Every day | ORAL | Status: DC

## 2013-05-01 MED ORDER — TRAZODONE HCL 50 MG PO TABS: 50.0000 mg | ORAL_TABLET | Freq: Every day | ORAL | Status: DC

## 2013-05-01 MED ORDER — NITROGLYCERIN 0.4 MG SL SUBL: 0.4000 mg | SUBLINGUAL_TABLET | SUBLINGUAL | Status: DC | PRN

## 2013-05-01 MED ORDER — PRAVASTATIN SODIUM 10 MG PO TABS: 10.0000 mg | ORAL_TABLET | Freq: Every day | ORAL | Status: DC

## 2013-05-01 NOTE — Patient Instructions (Addendum)
Continue current medications Meds refilled F/U 3 months - come fasting

## 2013-05-01 NOTE — Progress Notes (Signed)
  Subjective:    Patient ID: Gwendolyn Woodward, female    DOB: 11/30/1965, 47 y.o.   MRN: [id number]  HPI Pt here to f/ chronic medical problems and for med refills. Has been out of meds past couple of weeks.  Complains of growth on right finger x 2 months, thought she had wart, told by pharmacy to try OTC wart removal medicine, used for about a month but noticed more redness and enlargement of the growth. Mild discomfort, no drainage.   Review of Systems   GEN- denies fatigue, fever, weight loss,weakness, recent illness HEENT- denies eye drainage, change in vision, nasal discharge, CVS- denies chest pain, palpitations RESP- denies SOB, cough, wheeze ABD- denies N/V, change in stools, abd pain GU- denies dysuria, hematuria, dribbling, incontinence MSK- denies joint pain, muscle aches, injury Neuro- denies headache, dizziness, syncope, seizure activity      Objective:   Physical Exam GEN- NAD, alert and oriented x3 HEENT- PERRL, EOMI, non injected sclera, pink conjunctiva, MMM, oropharynx clear Neck- Supple, no bruit CVS- RRR, no murmur RESP-CTAB ABD-NABS,soft,NT,ND Skin- Right hand middle finger- granulomatous round lesion with erythema and scabbing above PIP, mild TTP, no drainage EXT- No edema Pulses- Radial, DP- 2+        Assessment & Plan:

## 2013-05-02 ENCOUNTER — Encounter: Payer: Self-pay | Admitting: Family Medicine

## 2013-05-02 DIAGNOSIS — L980 Pyogenic granuloma: Secondary | ICD-10-CM | POA: Insufficient documentation

## 2013-05-02 DIAGNOSIS — E785 Hyperlipidemia, unspecified: Secondary | ICD-10-CM | POA: Insufficient documentation

## 2013-05-02 NOTE — Assessment & Plan Note (Signed)
Restart BP meds 

## 2013-05-02 NOTE — Assessment & Plan Note (Signed)
Well controlled on metformin

## 2013-05-02 NOTE — Assessment & Plan Note (Signed)
Refer to general surgery for removal 

## 2013-05-02 NOTE — Assessment & Plan Note (Signed)
Restart cholesterol medications

## 2013-05-10 ENCOUNTER — Emergency Department (HOSPITAL_COMMUNITY)
Admission: EM | Admit: 2013-05-10 | Discharge: 2013-05-10 | Disposition: A | Discharge status: Home / Self Care | Payer: Medicare Other | Attending: Emergency Medicine | Admitting: Emergency Medicine

## 2013-05-10 ENCOUNTER — Encounter (HOSPITAL_COMMUNITY): Payer: Self-pay | Admitting: Emergency Medicine

## 2013-05-10 DIAGNOSIS — G4730 Sleep apnea, unspecified: Secondary | ICD-10-CM | POA: Insufficient documentation

## 2013-05-10 DIAGNOSIS — R197 Diarrhea, unspecified: Secondary | ICD-10-CM | POA: Insufficient documentation

## 2013-05-10 DIAGNOSIS — J45909 Unspecified asthma, uncomplicated: Secondary | ICD-10-CM | POA: Insufficient documentation

## 2013-05-10 DIAGNOSIS — I10 Essential (primary) hypertension: Secondary | ICD-10-CM | POA: Insufficient documentation

## 2013-05-10 DIAGNOSIS — Z8673 Personal history of transient ischemic attack (TIA), and cerebral infarction without residual deficits: Secondary | ICD-10-CM | POA: Insufficient documentation

## 2013-05-10 DIAGNOSIS — R630 Anorexia: Secondary | ICD-10-CM | POA: Insufficient documentation

## 2013-05-10 DIAGNOSIS — E860 Dehydration: Secondary | ICD-10-CM | POA: Insufficient documentation

## 2013-05-10 DIAGNOSIS — M129 Arthropathy, unspecified: Secondary | ICD-10-CM | POA: Insufficient documentation

## 2013-05-10 DIAGNOSIS — R42 Dizziness and giddiness: Secondary | ICD-10-CM

## 2013-05-10 DIAGNOSIS — F341 Dysthymic disorder: Secondary | ICD-10-CM | POA: Insufficient documentation

## 2013-05-10 DIAGNOSIS — Z862 Personal history of diseases of the blood and blood-forming organs and certain disorders involving the immune mechanism: Secondary | ICD-10-CM | POA: Insufficient documentation

## 2013-05-10 DIAGNOSIS — E785 Hyperlipidemia, unspecified: Secondary | ICD-10-CM | POA: Insufficient documentation

## 2013-05-10 DIAGNOSIS — Z79899 Other long term (current) drug therapy: Secondary | ICD-10-CM | POA: Insufficient documentation

## 2013-05-10 LAB — COMPREHENSIVE METABOLIC PANEL
ALT: 14 U/L (ref 0–35)
AST: 11 U/L (ref 0–37)
Albumin: 3.4 g/dL — ABNORMAL LOW (ref 3.5–5.2)
Alkaline Phosphatase: 94 U/L (ref 39–117)
BUN: 15 mg/dL (ref 6–23)
CO2: 29 mEq/L (ref 19–32)
Calcium: 9.5 mg/dL (ref 8.4–10.5)
Chloride: 103 mEq/L (ref 96–112)
Creatinine, Ser: 1.13 mg/dL — ABNORMAL HIGH (ref 0.50–1.10)
GFR calc Af Amer: 65 mL/min — ABNORMAL LOW (ref >90)
GFR calc non Af Amer: 56 mL/min — ABNORMAL LOW (ref >90)
Glucose, Bld: 100 mg/dL — ABNORMAL HIGH (ref 70–99)
Potassium: 4.1 mEq/L (ref 3.5–5.1)
Sodium: 140 mEq/L (ref 135–145)
Total Bilirubin: <0.1 mg/dL — ABNORMAL LOW (ref 0.3–1.2)
Total Protein: 7.2 g/dL (ref 6.0–8.3)

## 2013-05-10 LAB — CBC
HCT: 38.9 % (ref 36.0–46.0)
Hemoglobin: 12.5 g/dL (ref 12.0–15.0)
MCH: 26.4 pg (ref 26.0–34.0)
MCHC: 32.1 g/dL (ref 30.0–36.0)
MCV: 82.2 fL (ref 78.0–100.0)
Platelets: 186 10*3/uL (ref 150–400)
RBC: 4.73 MIL/uL (ref 3.87–5.11)
RDW: 15.5 % (ref 11.5–15.5)
WBC: 5.7 10*3/uL (ref 4.0–10.5)

## 2013-05-10 LAB — URINALYSIS, ROUTINE W REFLEX MICROSCOPIC
Bilirubin Urine: NEGATIVE
Glucose, UA: NEGATIVE mg/dL
Ketones, ur: NEGATIVE mg/dL
Leukocytes, UA: NEGATIVE
Nitrite: NEGATIVE
Protein, ur: NEGATIVE mg/dL
Specific Gravity, Urine: >1.03 — ABNORMAL HIGH (ref 1.005–1.030)
Urobilinogen, UA: 0.2 mg/dL (ref 0.0–1.0)
pH: 5.5 (ref 5.0–8.0)

## 2013-05-10 LAB — URINE MICROSCOPIC-ADD ON

## 2013-05-10 LAB — TROPONIN I: Troponin I: <0.3 ng/mL (ref <0.30)

## 2013-05-10 NOTE — ED Provider Notes (Signed)
CSN: [id number]     Arrival date & time 05/10/13  1856 History   First MD Initiated Contact with Patient 05/10/13 1936     Chief Complaint  Patient presents with  . Dizziness   (Consider location/radiation/quality/duration/timing/severity/associated sxs/prior Treatment) HPI Comments: Patient is a 47 year old female, she has a history of a stroke, hypertension and asthma. She states that she has had chronic dizziness which has been present for years, this dizziness feels like she is lightheaded and may pass out, and often happens when she stands or has been standing for some time. She states that today this happened again, is not associated with chest pain, palpitations, shortness of breath, nausea or vomiting. She has been taking her medications as prescribed by her family doctor who she saw last week and refilled her medications. She had her last MRI approximately one and a half years ago which showed no signs of mass lesions, tumors or bleeding. She does have intermittent headaches and states that her eyes hurt sometimes, they water frequently. At this time the patient is not having any chest pain though she did have a brief episode of chest pain earlier today. She has no swelling in her legs, no dysuria, no diarrhea, she has had a poor appetite and states that all she had to eat today was a piece of frozen pie and ice cream.  The history is provided by the patient and medical records.    Past Medical History  Diagnosis Date  . Hypertension     Lab 09/2011: Normal CMet except glucose of 109-169 and albumin-3.4; normal BP 09/2011-12/2011  . Asthma   . CVA (cerebral infarction) 1984 , 1988    x2, left sided weakness, history of dizziness  . Sleep apnea     Does not use CPAP - no longer has CPAP machine  . History of anemia     S/p transfusions in 1999 at [facility] after vaginal bleeding; normal CBC and 09/2011  . Gastroesophageal reflux disease     no meds  . Headache(784.0)     OTC med PRN  .  Arthritis     back pain and knee pain, no meds  . Depression with anxiety     History- no meds  . Chest pain     + dyspnea  . Dysphagia   . Hyperlipidemia     lipid profile in 11/2011: 203, 106, 58, 124  . Fasting hyperglycemia   . Stroke    Past Surgical History  Procedure Laterality Date  . Cesarean section  1984, 1987    x 2  . Wisdom tooth extraction    . Tubal ligation    . Iud removal  10/20/2011    Procedure: INTRAUTERINE DEVICE (IUD) REMOVAL;  Surgeon: Leslie Andrea, MD;  Location: [facility];  Service: Gynecology;  Laterality: N/A;  . Endometrial ablation  Feb 2013  . Esophagogastroduodenoscopy  5/11    [facility]-Dr Eliott Nine GERD distal esophagus, NEGATIVE bx for Barretts  . Colonoscopy  01/27/2012    Procedure: COLONOSCOPY;  Surgeon: West Bali, MD;  Location: [facility];  Service: Endoscopy;  Laterality: N/A;  8:30   Family History  Problem Relation Age of Onset  . Anesthesia problems Neg Hx   . Hypertension Mother   . Asthma Mother   . Cancer Father     Lung/Throat  . Heart disease Father   . Diabetes Maternal Uncle    History  Substance Use Topics  . Smoking status:  Never Smoker   . Smokeless tobacco: Never Used  . Alcohol Use: Yes     Comment: Socially, 2 times per yr   OB History   Grav Para Term Preterm Abortions TAB SAB Ect Mult Living   2 2 2  0 0 0 0 0 0 2     Review of Systems  All other systems reviewed and are negative.    Allergies  Aspirin; Hydrocodone; and Tramadol  Home Medications   Current Outpatient Rx  Name  Route  Sig  Dispense  Refill  . albuterol (PROVENTIL HFA;VENTOLIN HFA) 108 (90 BASE) MCG/ACT inhaler   Inhalation   Inhale 2 puffs into the lungs every 6 (six) hours as needed. Takes for shortness of breath         . lisinopril (PRINIVIL,ZESTRIL) 10 MG tablet   Oral   Take 1 tablet (10 mg total) by mouth daily.   30 tablet   3     D/C the norvasc, pt to start lisinopril in July   .  oxyCODONE-acetaminophen (PERCOCET/ROXICET) 5-325 MG per tablet   Oral   Take 1 tablet by mouth every 4 (four) hours as needed for pain.         . pravastatin (PRAVACHOL) 10 MG tablet   Oral   Take 1 tablet (10 mg total) by mouth daily.   30 tablet   6   . traZODone (DESYREL) 50 MG tablet   Oral   Take 1 tablet (50 mg total) by mouth at bedtime. For sleep   30 tablet   3   . nitroGLYCERIN (NITROSTAT) 0.4 MG SL tablet   Sublingual   Place 1 tablet (0.4 mg total) under the tongue every 5 (five) minutes as needed for chest pain.   25 tablet   3    BP 144/85  Pulse 72  Temp(Src) 97.9 F (36.6 C) (Oral)  Resp 20  Ht 5\' 1"  (1.549 m)  Wt 245 lb (111.131 kg)  BMI 46.32 kg/m2  SpO2 100% Physical Exam  Nursing note and vitals reviewed. Constitutional: She appears well-developed and well-nourished. No distress.  HENT:  Head: Normocephalic and atraumatic.  Mouth/Throat: Oropharynx is clear and moist. No oropharyngeal exudate.  Eyes: Conjunctivae and EOM are normal. Pupils are equal, round, and reactive to light. Right eye exhibits no discharge. Left eye exhibits no discharge. No scleral icterus.  Neck: Normal range of motion. Neck supple. No JVD present. No thyromegaly present.  Cardiovascular: Normal rate, regular rhythm, normal heart sounds and intact distal pulses.  Exam reveals no gallop and no friction rub.   No murmur heard. Pulmonary/Chest: Effort normal and breath sounds normal. No respiratory distress. She has no wheezes. She has no rales.  Abdominal: Soft. Bowel sounds are normal. She exhibits no distension and no mass. There is no tenderness.  Musculoskeletal: Normal range of motion. She exhibits no edema and no tenderness.  Lymphadenopathy:    She has no cervical adenopathy.  Neurological: She is alert. Coordination normal.  Speech is clear, cranial nerves III through XII are intact, memory is intact, strength is normal in all 4 extremities including grips, sensation  is intact to light touch and pinprick in all 4 extremities. Coordination as tested by finger-nose-finger is normal, no limb ataxia. Normal gait, normal reflexes at the patellar tendons bilaterally  Skin: Skin is warm and dry. No rash noted. No erythema.  Psychiatric: She has a normal mood and affect. Her behavior is normal.    ED Course  Procedures (including critical care time) Labs Review Labs Reviewed  COMPREHENSIVE METABOLIC PANEL - Abnormal; Notable for the following:    Glucose, Bld 100 (*)    Creatinine, Ser 1.13 (*)    Albumin 3.4 (*)    Total Bilirubin <0.1 (*)    GFR calc non Af Amer 56 (*)    GFR calc Af Amer 65 (*)    All other components within normal limits  URINALYSIS, ROUTINE W REFLEX MICROSCOPIC - Abnormal; Notable for the following:    Specific Gravity, Urine >1.030 (*)    Hgb urine dipstick TRACE (*)    All other components within normal limits  URINE MICROSCOPIC-ADD ON - Abnormal; Notable for the following:    Squamous Epithelial / LPF MANY (*)    Bacteria, UA MANY (*)    All other components within normal limits  CBC  TROPONIN I   Imaging Review No results found.  MDM   1. Dizziness   2. Dehydration    The patient is well appearing on my exam with essentially normal vital signs and no acute findings. Repeat imaging of her brain is not indicated at this time and she has had multiple evaluations for her stroke in the past as well as an MRI in the last year and a half but her symptoms predated this. I will perform an EKG, labs and a urinalysis but the patient overall appears stable.  ED ECG REPORT  I personally interpreted this EKG   Date: 05/10/2013   Rate: 73  Rhythm: normal sinus rhythm  QRS Axis: normal  Intervals: normal  ST/T Wave abnormalities: normal  Conduction Disutrbances:none  Narrative Interpretation:   Old EKG Reviewed: 09/12/12 - no changes  Labs look normal - she has normal WBC, normal Hgb and normal lytes.  Trop normal and ECG normal.   She has UA pending at this time.  LIkely d/c.   UA shows dehydration only - pt able to take PO.  Meds given in ED:    Vida Roller, MD 05/10/13 2130

## 2013-05-10 NOTE — ED Notes (Signed)
Patient c/o intermittent dizziness x 2 days.  Patient states she thinks it's her BP.  States having episodes of things "going black".  Patient states she also hurts all over.

## 2013-05-16 ENCOUNTER — Ambulatory Visit (INDEPENDENT_AMBULATORY_CARE_PROVIDER_SITE_OTHER): Payer: Medicare Other | Admitting: Family Medicine

## 2013-05-16 ENCOUNTER — Encounter: Payer: Self-pay | Admitting: Family Medicine

## 2013-05-16 VITALS — BP 140/90 | HR 78 | Temp 97.8°F | Resp 18 | Ht 61.0 in | Wt 240.0 lb

## 2013-05-16 DIAGNOSIS — L980 Pyogenic granuloma: Secondary | ICD-10-CM

## 2013-05-16 DIAGNOSIS — G43909 Migraine, unspecified, not intractable, without status migrainosus: Secondary | ICD-10-CM

## 2013-05-16 DIAGNOSIS — F3289 Other specified depressive episodes: Secondary | ICD-10-CM

## 2013-05-16 DIAGNOSIS — K219 Gastro-esophageal reflux disease without esophagitis: Secondary | ICD-10-CM

## 2013-05-16 DIAGNOSIS — F32A Depression, unspecified: Secondary | ICD-10-CM | POA: Insufficient documentation

## 2013-05-16 DIAGNOSIS — F329 Major depressive disorder, single episode, unspecified: Secondary | ICD-10-CM

## 2013-05-16 MED ORDER — OMEPRAZOLE 20 MG PO CPDR: 20.0000 mg | DELAYED_RELEASE_CAPSULE | Freq: Every day | ORAL | Status: DC

## 2013-05-16 MED ORDER — SUMATRIPTAN SUCCINATE 100 MG PO TABS: 100.0000 mg | ORAL_TABLET | ORAL | Status: DC | PRN

## 2013-05-16 MED ORDER — FLUOXETINE HCL 10 MG PO CAPS: 10.0000 mg | ORAL_CAPSULE | Freq: Every day | ORAL | Status: DC

## 2013-05-16 NOTE — Assessment & Plan Note (Signed)
Will start her on Prozac 10 mg. I will also set her up with [facility] If we can salvage transportation we may be able to get her back into therapy as well. I'll have her followup in 6 weeks

## 2013-05-16 NOTE — Assessment & Plan Note (Addendum)
Chronic problem will try her on acetaminophen and imitrex as needed May need to start topamax, with her depression will start SSRI first and then follow
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
## 2013-05-16 NOTE — Progress Notes (Unsigned)
Patient ID: Gwendolyn Woodward, female   DOB: 06-Dec-1965, 47 y.o.   MRN: [id number] Faxed over referral to [facility]

## 2013-05-16 NOTE — Patient Instructions (Signed)
Omeprazole for the stomach pain, 1 tablet before breakfast Take the Imitrex once a day if you have a bad migraine Try the tylenol as needed Start the Prozac for your mood  F/U 6 weeks

## 2013-05-16 NOTE — Assessment & Plan Note (Addendum)
The lesion has decreased some, has appt with general surgery coming up

## 2013-05-16 NOTE — Assessment & Plan Note (Signed)
Restart omeprazole daily 

## 2013-06-06 ENCOUNTER — Ambulatory Visit: Payer: Medicare Other | Admitting: Family Medicine

## 2013-06-22 ENCOUNTER — Encounter (HOSPITAL_COMMUNITY): Payer: Self-pay | Admitting: Emergency Medicine

## 2013-06-22 ENCOUNTER — Ambulatory Visit (HOSPITAL_COMMUNITY): Payer: Medicare Other

## 2013-06-22 ENCOUNTER — Encounter: Payer: Self-pay | Admitting: Family Medicine

## 2013-06-22 ENCOUNTER — Ambulatory Visit (INDEPENDENT_AMBULATORY_CARE_PROVIDER_SITE_OTHER): Payer: Medicare Other | Admitting: Family Medicine

## 2013-06-22 ENCOUNTER — Emergency Department (HOSPITAL_COMMUNITY)
Admission: EM | Admit: 2013-06-22 | Discharge: 2013-06-22 | Disposition: A | Discharge status: Home / Self Care | Payer: Medicare Other | Attending: Emergency Medicine | Admitting: Emergency Medicine

## 2013-06-22 ENCOUNTER — Emergency Department (HOSPITAL_COMMUNITY): Payer: Medicare Other

## 2013-06-22 VITALS — BP 140/80 | HR 78 | Temp 97.3°F | Resp 18 | Wt 246.0 lb

## 2013-06-22 DIAGNOSIS — M7989 Other specified soft tissue disorders: Secondary | ICD-10-CM

## 2013-06-22 DIAGNOSIS — Z862 Personal history of diseases of the blood and blood-forming organs and certain disorders involving the immune mechanism: Secondary | ICD-10-CM | POA: Insufficient documentation

## 2013-06-22 DIAGNOSIS — F329 Major depressive disorder, single episode, unspecified: Secondary | ICD-10-CM

## 2013-06-22 DIAGNOSIS — Z79899 Other long term (current) drug therapy: Secondary | ICD-10-CM | POA: Insufficient documentation

## 2013-06-22 DIAGNOSIS — Z8739 Personal history of other diseases of the musculoskeletal system and connective tissue: Secondary | ICD-10-CM | POA: Insufficient documentation

## 2013-06-22 DIAGNOSIS — I10 Essential (primary) hypertension: Secondary | ICD-10-CM

## 2013-06-22 DIAGNOSIS — F3289 Other specified depressive episodes: Secondary | ICD-10-CM

## 2013-06-22 DIAGNOSIS — J45909 Unspecified asthma, uncomplicated: Secondary | ICD-10-CM | POA: Insufficient documentation

## 2013-06-22 DIAGNOSIS — F341 Dysthymic disorder: Secondary | ICD-10-CM | POA: Insufficient documentation

## 2013-06-22 DIAGNOSIS — Z8673 Personal history of transient ischemic attack (TIA), and cerebral infarction without residual deficits: Secondary | ICD-10-CM | POA: Insufficient documentation

## 2013-06-22 DIAGNOSIS — E785 Hyperlipidemia, unspecified: Secondary | ICD-10-CM | POA: Insufficient documentation

## 2013-06-22 DIAGNOSIS — K219 Gastro-esophageal reflux disease without esophagitis: Secondary | ICD-10-CM | POA: Insufficient documentation

## 2013-06-22 DIAGNOSIS — R079 Chest pain, unspecified: Secondary | ICD-10-CM

## 2013-06-22 DIAGNOSIS — F32A Depression, unspecified: Secondary | ICD-10-CM

## 2013-06-22 DIAGNOSIS — Z8669 Personal history of other diseases of the nervous system and sense organs: Secondary | ICD-10-CM | POA: Insufficient documentation

## 2013-06-22 DIAGNOSIS — K2970 Gastritis, unspecified, without bleeding: Secondary | ICD-10-CM

## 2013-06-22 LAB — COMPREHENSIVE METABOLIC PANEL
ALT: 16 U/L (ref 0–35)
AST: 12 U/L (ref 0–37)
Albumin: 3.5 g/dL (ref 3.5–5.2)
Alkaline Phosphatase: 99 U/L (ref 39–117)
BUN: 16 mg/dL (ref 6–23)
CO2: 28 mEq/L (ref 19–32)
Calcium: 9.7 mg/dL (ref 8.4–10.5)
Chloride: 102 mEq/L (ref 96–112)
Creatinine, Ser: 1.03 mg/dL (ref 0.50–1.10)
GFR calc Af Amer: 73 mL/min — ABNORMAL LOW (ref >90)
GFR calc non Af Amer: 63 mL/min — ABNORMAL LOW (ref >90)
Glucose, Bld: 104 mg/dL — ABNORMAL HIGH (ref 70–99)
Potassium: 4 mEq/L (ref 3.5–5.1)
Sodium: 141 mEq/L (ref 135–145)
Total Bilirubin: 0.2 mg/dL — ABNORMAL LOW (ref 0.3–1.2)
Total Protein: 7.3 g/dL (ref 6.0–8.3)

## 2013-06-22 LAB — CBC WITH DIFFERENTIAL/PLATELET
Basophils Absolute: 0 10*3/uL (ref 0.0–0.1)
Basophils Relative: 0 % (ref 0–1)
Eosinophils Absolute: 0 10*3/uL (ref 0.0–0.7)
Eosinophils Relative: 1 % (ref 0–5)
HCT: 41.7 % (ref 36.0–46.0)
Hemoglobin: 13.4 g/dL (ref 12.0–15.0)
Lymphocytes Relative: 37 % (ref 12–46)
Lymphs Abs: 1.8 10*3/uL (ref 0.7–4.0)
MCH: 26.4 pg (ref 26.0–34.0)
MCHC: 32.1 g/dL (ref 30.0–36.0)
MCV: 82.2 fL (ref 78.0–100.0)
Monocytes Absolute: 0.3 10*3/uL (ref 0.1–1.0)
Monocytes Relative: 6 % (ref 3–12)
Neutro Abs: 2.8 10*3/uL (ref 1.7–7.7)
Neutrophils Relative %: 56 % (ref 43–77)
Platelets: 179 10*3/uL (ref 150–400)
RBC: 5.07 MIL/uL (ref 3.87–5.11)
RDW: 14.9 % (ref 11.5–15.5)
WBC: 5 10*3/uL (ref 4.0–10.5)

## 2013-06-22 LAB — TROPONIN I: Troponin I: <0.3 ng/mL (ref <0.30)

## 2013-06-22 LAB — LIPASE, BLOOD: Lipase: 20 U/L (ref 11–59)

## 2013-06-22 MED ORDER — ONDANSETRON HCL 4 MG/2ML IJ SOLN
4.0000 mg | Freq: Once | INTRAMUSCULAR | Status: AC
  Administered 2013-06-22: 4 mg via INTRAVENOUS
  Filled 2013-06-22: qty 2

## 2013-06-22 MED ORDER — PANTOPRAZOLE SODIUM 40 MG IV SOLR
40.0000 mg | Freq: Once | INTRAVENOUS | Status: AC
  Administered 2013-06-22: 40 mg via INTRAVENOUS
  Filled 2013-06-22: qty 40

## 2013-06-22 MED ORDER — LISINOPRIL 20 MG PO TABS: 20.0000 mg | ORAL_TABLET | Freq: Every day | ORAL | Status: DC

## 2013-06-22 MED ORDER — GI COCKTAIL ~~LOC~~
30.0000 mL | Freq: Once | ORAL | Status: AC
  Administered 2013-06-22: 30 mL via ORAL
  Filled 2013-06-22: qty 30

## 2013-06-22 MED ORDER — GI COCKTAIL ~~LOC~~
30.0000 mL | Freq: Once | ORAL | Status: AC
  Administered 2013-06-22: 30 mL via ORAL

## 2013-06-22 MED ORDER — FLUOXETINE HCL 20 MG PO CAPS: 20.0000 mg | ORAL_CAPSULE | Freq: Every day | ORAL | Status: DC

## 2013-06-22 MED ORDER — GI COCKTAIL ~~LOC~~
ORAL | Status: AC
  Filled 2013-06-22: qty 30

## 2013-06-22 NOTE — Assessment & Plan Note (Signed)
Uncontrolled, will need toincrease lisinopril to 20mg 

## 2013-06-22 NOTE — ED Provider Notes (Signed)
CSN: [id number]     Arrival date & time 06/22/13  1213 History  This chart was scribed for [name], MD by Bennett Scrape, ED Scribe. This patient was seen in room APA08/APA08 and the patient's care was started at 1:20 PM.   Chief Complaint  Patient presents with  . Chest Pain    Patient is a 47 y.o. female presenting with chest pain. The history is provided by the patient. No language interpreter was used.  Chest Pain Pain quality: sharp   Pain quality: not radiating   Pain radiates to:  Does not radiate Timing:  Intermittent Chronicity:  Recurrent Relieved by:  Nothing Worsened by:  Nothing tried Ineffective treatments:  None tried Associated symptoms: no abdominal pain, no back pain, no cough, no fatigue and no headache     HPI Comments: Gwendolyn Woodward is a 47 y.o. female who presents to the Emergency Department complaining of intermittent CP described as sharp and non-radiating that started while at her PMD's office this morning. Pt points more the the lower chest/epigastric area when asked. She denies any diaphoresis, nausea or emesis. She has a h/o similar brief CP. She denies any h/o prior MI or stent placement.    Past Medical History  Diagnosis Date  . Hypertension     Lab 09/2011: Normal CMet except glucose of 109-169 and albumin-3.4; normal BP 09/2011-12/2011  . Asthma   . CVA (cerebral infarction) 1984 , 1988    x2, left sided weakness, history of dizziness  . Sleep apnea     Does not use CPAP - no longer has CPAP machine  . History of anemia     S/p transfusions in 1999 at [facility] after vaginal bleeding; normal CBC and 09/2011  . Gastroesophageal reflux disease     no meds  . Headache(784.0)     OTC med PRN  . Arthritis     back pain and knee pain, no meds  . Depression with anxiety     History- no meds  . Chest pain     + dyspnea  . Dysphagia   . Hyperlipidemia     lipid profile in 11/2011: 203, 106, 58, 124  . Fasting hyperglycemia   . Stroke     Past Surgical History  Procedure Laterality Date  . Cesarean section  1984, 1987    x 2  . Wisdom tooth extraction    . Tubal ligation    . Iud removal  10/20/2011    Procedure: INTRAUTERINE DEVICE (IUD) REMOVAL;  Surgeon: Leslie Andrea, MD;  Location: [facility];  Service: Gynecology;  Laterality: N/A;  . Endometrial ablation  Feb 2013  . Esophagogastroduodenoscopy  5/11    [facility]Dr Eliott Nine GERD distal esophagus, NEGATIVE bx for Barretts  . Colonoscopy  01/27/2012    Procedure: COLONOSCOPY;  Surgeon: West Bali, MD;  Location: [facility];  Service: Endoscopy;  Laterality: N/A;  8:30   Family History  Problem Relation Age of Onset  . Anesthesia problems Neg Hx   . Hypertension Mother   . Asthma Mother   . Cancer Father     Lung/Throat  . Heart disease Father   . Diabetes Maternal Uncle    History  Substance Use Topics  . Smoking status: Never Smoker   . Smokeless tobacco: Never Used  . Alcohol Use: Yes     Comment: Socially, 2 times per yr   OB History   Grav Para Term Preterm Abortions TAB  SAB Ect Mult Living   2 2 2  0 0 0 0 0 0 2     Review of Systems  Constitutional: Negative for appetite change and fatigue.  HENT: Negative for congestion, ear discharge and sinus pressure.   Eyes: Negative for discharge.  Respiratory: Negative for cough.   Cardiovascular: Negative for chest pain.  Gastrointestinal: Negative for abdominal pain and diarrhea.  Genitourinary: Negative for frequency and hematuria.  Musculoskeletal: Negative for back pain.  Skin: Negative for rash.  Neurological: Negative for seizures and headaches.  Psychiatric/Behavioral: Negative for hallucinations.    Allergies  Aspirin; Hydrocodone; and Tramadol  Home Medications   Current Outpatient Rx  Name  Route  Sig  Dispense  Refill  . FLUoxetine (PROZAC) 10 MG capsule   Oral   Take 10 mg by mouth daily.         Marland Kitchen lisinopril (PRINIVIL,ZESTRIL) 10 MG tablet   Oral    Take 10 mg by mouth daily.         . metFORMIN (GLUCOPHAGE-XR) 500 MG 24 hr tablet   Oral   Take 500 mg by mouth daily with breakfast.         . nitroGLYCERIN (NITROSTAT) 0.4 MG SL tablet   Sublingual   Place 1 tablet (0.4 mg total) under the tongue every 5 (five) minutes as needed for chest pain.   25 tablet   3   . omeprazole (PRILOSEC) 20 MG capsule   Oral   Take 1 capsule (20 mg total) by mouth daily.   30 capsule   3   . oxyCODONE-acetaminophen (PERCOCET/ROXICET) 5-325 MG per tablet   Oral   Take 1 tablet by mouth 2 (two) times daily as needed for pain.         . pravastatin (PRAVACHOL) 10 MG tablet   Oral   Take 1 tablet (10 mg total) by mouth daily.   30 tablet   6   . traZODone (DESYREL) 50 MG tablet   Oral   Take 50 mg by mouth at bedtime as needed for sleep.         . methocarbamol (ROBAXIN) 500 MG tablet   Oral   Take 500 mg by mouth 3 (three) times daily as needed (muscle spasm).          Triage Vitals: BP 158/92  Temp(Src) 97.8 F (36.6 C) (Oral)  Resp 20  Ht 5\' 1"  (1.549 m)  Wt 246 lb (111.585 kg)  BMI 46.51 kg/m2  SpO2 100%  Physical Exam  Nursing note and vitals reviewed. Constitutional: She is oriented to person, place, and time. She appears well-developed and well-nourished.  HENT:  Head: Normocephalic and atraumatic.  Eyes: Conjunctivae and EOM are normal. No scleral icterus.  Neck: Neck supple. No thyromegaly present.  Cardiovascular: Normal rate and regular rhythm.  Exam reveals no gallop and no friction rub.   No murmur heard. Pulmonary/Chest: Effort normal and breath sounds normal. No stridor. She has no wheezes. She has no rales. She exhibits no tenderness.  Abdominal: She exhibits no distension. There is tenderness (epigastric tenderness). There is no rebound.  Musculoskeletal: Normal range of motion. She exhibits no edema.  Tenderness to lateral upper left arm, overlaying skin is intact, no erythema or swelling   Lymphadenopathy:    She has no cervical adenopathy.  Neurological: She is alert and oriented to person, place, and time. She exhibits normal muscle tone. Coordination normal.  Skin: Skin is warm and dry. No rash noted.  No erythema.  Psychiatric: She has a normal mood and affect. Her behavior is normal.    ED Course  Procedures (including critical care time)  Medications  pantoprazole (PROTONIX) injection 40 mg (40 mg Intravenous Given 06/22/13 1345)  gi cocktail (Maalox,Lidocaine,Donnatal) (30 mLs Oral Given 06/22/13 1345)  ondansetron (ZOFRAN) injection 4 mg (4 mg Intravenous Given 06/22/13 1345)    DIAGNOSTIC STUDIES: Oxygen Saturation is 100% on room air, normal by my interpretation.    COORDINATION OF CARE: 1:25 PM-Discussed treatment plan which includes medications, CXR, CBC panel, CMP and troponin with pt at bedside and pt agreed to plan.   2:39 PM-Pt rechecked and feels improved with medications listed above. She is currently eating a burger and fries. She also c/o left upper arm pain. She denies any recent falls or traumas and states that it intermittently feels numb. Will order [place] for DVT. Pt is agreeable to additional testing.   Labs Review Labs Reviewed  COMPREHENSIVE METABOLIC PANEL - Abnormal; Notable for the following:    Glucose, Bld 104 (*)    Total Bilirubin 0.2 (*)    GFR calc non Af Amer 63 (*)    GFR calc Af Amer 73 (*)    All other components within normal limits  CBC WITH DIFFERENTIAL  LIPASE, BLOOD  TROPONIN I   Imaging Review Dg Abd Acute W/chest  06/22/2013   CLINICAL DATA:  Mid upper abdominal pain  EXAM: ACUTE ABDOMEN SERIES (ABDOMEN 2 VIEW & CHEST 1 VIEW)  COMPARISON:  None.  FINDINGS: Lungs are clear. No pleural effusion or pneumothorax.  The heart is normal in size.  Nonobstructive bowel gas pattern.  Moderate colonic stool burden.  Degenerative changes of the visualized thoracolumbar spine.  IMPRESSION: No evidence of acute cardiopulmonary  disease.  No evidence of small bowel obstruction or free air.  Moderate colonic stool burden, raising the possibility of constipation.   Electronically Signed   By: Charline Bills M.D.   On: 06/22/2013 14:22    EKG Interpretation     Ventricular Rate:  67 PR Interval:  176 QRS Duration: 94 QT Interval:  392 QTC Calculation: 414 R Axis:   20 Text Interpretation:  Normal sinus rhythm Normal ECG When compared with ECG of 10-May-2013 19:05, No significant change was found            MDM  No diagnosis found.   The chart was scribed for me under my direct supervision.  I personally performed the history, physical, and medical decision making and all procedures in the evaluation of this patient.[name], MD 06/22/13 [phone]

## 2013-06-22 NOTE — ED Notes (Signed)
Pt states hx of CP, which is documented in hx. States she was at PMD office this morning and chest began hurting and was sent here from doctor's office.  Sharp, nonradiating, intermittent  CP . States arm left arm has been cold, "Feels like ice", per pt.

## 2013-06-22 NOTE — Assessment & Plan Note (Signed)
Left arm pain/ swelling, fingers cool to touch Obtain Venous US r/o DVT  I discussed with EDP as she was sent to ER due to recurrent chest pain

## 2013-06-22 NOTE — Assessment & Plan Note (Signed)
Atypical chest pain, EKG reassuring however with recurrent pain and risk factors Had stress test in 2013 unremarkable Send to ED for evaluation

## 2013-06-22 NOTE — Assessment & Plan Note (Signed)
Plan to increase to prozac 20

## 2013-06-22 NOTE — ED Notes (Signed)
Patient with no complaints at this time. Respirations even and unlabored. Skin warm/dry. Discharge instructions reviewed with patient at this time. Patient given opportunity to voice concerns/ask questions. IV removed per policy and band-aid applied to site. Patient discharged at this time and left Emergency Department with steady gait.  

## 2013-06-22 NOTE — ED Notes (Signed)
Patient is resting comfortably. 

## 2013-06-22 NOTE — Progress Notes (Signed)
  Subjective:    Patient ID: ALIAYAH TYER, female    DOB: 1966-08-17, 47 y.o.   MRN: [id number]  HPI  Patient here with chest pain and numbness in her left arm. Today for the past couple weeks she's had recurrent episodes of sharp stabbing chest pain denies any diaphoresis or nausea associated. She also feels like she has swelling and pain in her left arm in her fingers feel very cool to touch compared to the right side. Her abdominal pain for the last visit resolved with the use of Prilosec. She continues to have some dizzy spells as well. She's taken her blood pressure medicine as prescribed. She did try the Imitrex for headaches however this did not help and she's been taking Advil p.m.  Note she did not take any nitroglycerin for her chest pain. She did have some cardiac workup back in 2013 but there was no acute findings.   Review of Systems  GEN- denies fatigue, fever, weight loss,weakness, recent illness HEENT- denies eye drainage, change in vision, nasal discharge, CVS- + chest pain, palpitations RESP- denies SOB, cough, wheeze ABD- denies N/V, change in stools, abd pain GU- denies dysuria, hematuria, dribbling, incontinence MSK- denies joint pain, muscle aches, injury Neuro- denies headache, +dizziness, syncope, seizure activity      Objective:   Physical Exam  GEN- NAD, alert and oriented x3 HEENT- PERRL, EOMI, non injected sclera, pink conjunctiva, MMM, oropharynx clear, fundus benign appearing Neck- Supple, no LAD CVS- RRR, no murmur, TTP center in chest RESP-CTAB ABD-NABS,soft, NT,ND EXT- No edema, mild swelling Left upper arm, TTP upper arm, no discrete mass felt, left fingers cool to touch compared to right side Pulses- Radial 2+  EKG- NSR, incomplete BBB unchanged      Assessment & Plan:     At end of visit patient complained of sharp chest pain again. Therefore I decided to send her to the emergency room for further workup.

## 2013-06-22 NOTE — Patient Instructions (Addendum)
Increase prozac to 20mg  for your mood We will f/u on [facility]  Increase lisinopril to 20mg  - Take 2 of the 10mg  tablets until you run out  Continue stomach medication Referral back to heart doctor Go get ultrasound today at 2pm [facility]     Pt had another episode of sharp chest pain due to her risk factors, will send to ED for work Please get ultrasound, has been already ordered for her arm

## 2013-06-27 ENCOUNTER — Ambulatory Visit: Payer: Medicare Other | Admitting: Family Medicine

## 2013-06-28 ENCOUNTER — Telehealth: Payer: Self-pay | Admitting: Family Medicine

## 2013-06-28 NOTE — Telephone Encounter (Signed)
Called pt and stated she is feeling better now, still has some chest pains but not as severe.

## 2013-06-29 NOTE — Telephone Encounter (Signed)
Pt aware of results 

## 2013-06-29 NOTE — Telephone Encounter (Signed)
Give her following instructions from last visit  For blood pressure- Increase lisinopril to 20mg , she can take 2 of the 10mg  tablets, when she runs out get the new prescription  For her mood, take 20mg  of prozac - new dose sent to pharmacy  For her stomach, take her omeprazole 1 tablet twice a day ( before breakfast and dinner)  for the next 2 weeks

## 2013-07-31 ENCOUNTER — Ambulatory Visit: Payer: Medicare Other | Admitting: Family Medicine

## 2013-10-03 ENCOUNTER — Ambulatory Visit (INDEPENDENT_AMBULATORY_CARE_PROVIDER_SITE_OTHER): Payer: Medicare Other | Admitting: Family Medicine

## 2013-10-03 ENCOUNTER — Encounter: Payer: Self-pay | Admitting: Family Medicine

## 2013-10-03 VITALS — BP 150/100 | HR 78 | Temp 97.2°F | Resp 18 | Ht 61.0 in | Wt 249.0 lb

## 2013-10-03 DIAGNOSIS — E785 Hyperlipidemia, unspecified: Secondary | ICD-10-CM

## 2013-10-03 DIAGNOSIS — F329 Major depressive disorder, single episode, unspecified: Secondary | ICD-10-CM

## 2013-10-03 DIAGNOSIS — F3289 Other specified depressive episodes: Secondary | ICD-10-CM

## 2013-10-03 DIAGNOSIS — J069 Acute upper respiratory infection, unspecified: Secondary | ICD-10-CM | POA: Insufficient documentation

## 2013-10-03 DIAGNOSIS — F32A Depression, unspecified: Secondary | ICD-10-CM

## 2013-10-03 DIAGNOSIS — I10 Essential (primary) hypertension: Secondary | ICD-10-CM

## 2013-10-03 DIAGNOSIS — E119 Type 2 diabetes mellitus without complications: Secondary | ICD-10-CM

## 2013-10-03 LAB — CBC WITH DIFFERENTIAL/PLATELET
Basophils Absolute: 0 K/uL (ref 0.0–0.1)
Basophils Relative: 0 % (ref 0–1)
Eosinophils Absolute: 0.1 K/uL (ref 0.0–0.7)
Eosinophils Relative: 2 % (ref 0–5)
HCT: 41.2 % (ref 36.0–46.0)
Hemoglobin: 13.6 g/dL (ref 12.0–15.0)
Lymphocytes Relative: 36 % (ref 12–46)
Lymphs Abs: 2 K/uL (ref 0.7–4.0)
MCH: 26.6 pg (ref 26.0–34.0)
MCHC: 33 g/dL (ref 30.0–36.0)
MCV: 80.5 fL (ref 78.0–100.0)
Monocytes Absolute: 0.4 K/uL (ref 0.1–1.0)
Monocytes Relative: 7 % (ref 3–12)
Neutro Abs: 3.2 K/uL (ref 1.7–7.7)
Neutrophils Relative %: 55 % (ref 43–77)
Platelets: 219 K/uL (ref 150–400)
RBC: 5.12 MIL/uL — ABNORMAL HIGH (ref 3.87–5.11)
RDW: 15.3 % (ref 11.5–15.5)
WBC: 5.7 K/uL (ref 4.0–10.5)

## 2013-10-03 LAB — COMPREHENSIVE METABOLIC PANEL
ALT: 14 U/L (ref 0–35)
AST: 12 U/L (ref 0–37)
Albumin: 3.6 g/dL (ref 3.5–5.2)
Alkaline Phosphatase: 85 U/L (ref 39–117)
BUN: 20 mg/dL (ref 6–23)
CO2: 27 mEq/L (ref 19–32)
Calcium: 9.3 mg/dL (ref 8.4–10.5)
Chloride: 104 mEq/L (ref 96–112)
Creat: 1.06 mg/dL (ref 0.50–1.10)
Glucose, Bld: 84 mg/dL (ref 70–99)
Potassium: 4.5 mEq/L (ref 3.5–5.3)
Sodium: 139 mEq/L (ref 135–145)
Total Bilirubin: 0.2 mg/dL (ref 0.2–1.2)
Total Protein: 6.8 g/dL (ref 6.0–8.3)

## 2013-10-03 LAB — LIPID PANEL
Cholesterol: 180 mg/dL (ref 0–200)
HDL: 53 mg/dL (ref >39)
LDL Cholesterol: 98 mg/dL (ref 0–99)
Total CHOL/HDL Ratio: 3.4 ratio
Triglycerides: 146 mg/dL (ref <150)
VLDL: 29 mg/dL (ref 0–40)

## 2013-10-03 LAB — HEMOGLOBIN A1C
Hgb A1c MFr Bld: 6 % — ABNORMAL HIGH (ref <5.7)
Mean Plasma Glucose: 126 mg/dL — ABNORMAL HIGH (ref <117)

## 2013-10-03 MED ORDER — OMEPRAZOLE 20 MG PO CPDR: 20.0000 mg | DELAYED_RELEASE_CAPSULE | Freq: Every day | ORAL | Status: DC

## 2013-10-03 MED ORDER — FLUOXETINE HCL 20 MG PO CAPS: 20.0000 mg | ORAL_CAPSULE | Freq: Every day | ORAL | Status: DC

## 2013-10-03 MED ORDER — LISINOPRIL-HYDROCHLOROTHIAZIDE 10-12.5 MG PO TABS: 1.0000 | ORAL_TABLET | Freq: Every day | ORAL | Status: DC

## 2013-10-03 MED ORDER — PRAVASTATIN SODIUM 10 MG PO TABS: 10.0000 mg | ORAL_TABLET | Freq: Every day | ORAL | Status: DC

## 2013-10-03 MED ORDER — METFORMIN HCL ER 500 MG PO TB24: 500.0000 mg | ORAL_TABLET | Freq: Every day | ORAL | Status: DC

## 2013-10-03 MED ORDER — TRAZODONE HCL 50 MG PO TABS: 50.0000 mg | ORAL_TABLET | Freq: Every evening | ORAL | Status: DC | PRN

## 2013-10-03 MED ORDER — METHOCARBAMOL 500 MG PO TABS: 500.0000 mg | ORAL_TABLET | Freq: Three times a day (TID) | ORAL | Status: DC | PRN

## 2013-10-03 NOTE — Assessment & Plan Note (Signed)
Exam normal, given delsym from the office

## 2013-10-03 NOTE — Assessment & Plan Note (Addendum)
Restart prozac and trazodone for sleep Refer back to psychiatry  She needs more community support will work with [facility] Will change to [place] so she can have meds delivered

## 2013-10-03 NOTE — Assessment & Plan Note (Signed)
Check A1C, restart metformin and ACEI

## 2013-10-03 NOTE — Patient Instructions (Signed)
Restart your medications Take the cough medication given twice a day as needed I will contact your social worker  We will call with lab results F/U 2 weeks

## 2013-10-03 NOTE — Progress Notes (Signed)
Patient ID: Gwendolyn Woodward, female   DOB: [birth year], 48 y.o.   MRN: [id number]   Subjective:    Patient ID: Gwendolyn Woodward, female    DOB: Aug 16, 1966, 48 y.o.   MRN: [id number]  Patient presents for stills cold, no energy and Generalized Body Aches  patient presents with very vague symptoms states that she does not feel well in general and that she aches from head to toe she has had some cough for the past couple of days with minimal production no fever. She actually ran out of her medication 2 weeks ago because she does not have transportation to get to the pharmacy and therefore has not been on any of her medications including her diabetes hypertensive and depression medications. She became very tearful stating all she doesn't stay in the house and no one comes by to visit her and no one helps her out to take her to pay her bills she tells me that she's not even pay her rent for this month because she did not have any transportation. I did refer her to [facility] however there are multiple tries by there office to contct her over the past month and not until recently when she able to get in contact with the social worker who provides transportation to today's appointment.    Review Of Systems:  GEN- denies fatigue, fever, weight loss,weakness, recent illness HEENT- denies eye drainage, change in vision, nasal discharge, CVS- denies chest pain, palpitations RESP- denies SOB, cough, wheeze ABD- denies N/V, change in stools, abd pain GU- denies dysuria, hematuria, dribbling, incontinence MSK- denies joint pain, +muscle aches, injury Neuro- denies headache, dizziness, syncope, seizure activity       Objective:    BP 150/100  Pulse 78  Temp(Src) 97.2 F (36.2 C) (Oral)  Resp 18  Ht 5\' 1"  (1.549 m)  Wt 249 lb (112.946 kg)  BMI 47.07 kg/m2 GEN- NAD, alert and oriented x3 HEENT- PERRL, EOMI, non injected sclera, pink conjunctiva, MMM, oropharynx clear Neck- Supple,  CVS- RRR, no  murmur RESP-CTAB ABD-NABS,soft,NT,ND EXT- No edema Pulses- Radial 2+ PSYCH- Depressed affect, cyring, no SI, not overly anxious, well dressed        Assessment & Plan:      Problem List Items Addressed This Visit   URI, acute   Hypertension     Lisinopril changed to lisinopril HCTZ once a day, BP uncontrolled    Relevant Medications      LISINOPRIL-HCTZ 10-12.5 MG PO TABS      pravastatin (PRAVACHOL) tablet   Other Relevant Orders      CBC with Differential   Hyperlipidemia     Check FLP on statin    Relevant Orders      Lipid panel   Diabetes mellitus - Primary     Check A1C, restart metformin and ACEI    Relevant Medications      metFORMIN (GLUCOPHAGE-XR) 24 hr tablet   Other Relevant Orders      Comprehensive metabolic panel      Hemoglobin A1c      Lipid panel   Depression     Restart prozac and trazodone for sleep Refer back to psychiatry  She needs more community support will work with [facility]    Relevant Medications      FLUoxetine (PROZAC) capsule      traZODone (DESYREL) tablet      Note: This dictation was prepared with Dragon dictation along with smaller Company secretary. Any transcriptional errors that  result from this process are unintentional.

## 2013-10-03 NOTE — Assessment & Plan Note (Signed)
Check FLP on statin

## 2013-10-03 NOTE — Assessment & Plan Note (Signed)
Lisinopril changed to lisinopril HCTZ once a day, BP uncontrolled

## 2013-10-17 ENCOUNTER — Ambulatory Visit (INDEPENDENT_AMBULATORY_CARE_PROVIDER_SITE_OTHER): Payer: Medicare Other | Admitting: Family Medicine

## 2013-10-17 ENCOUNTER — Encounter: Payer: Self-pay | Admitting: Family Medicine

## 2013-10-17 VITALS — BP 148/86 | HR 76 | Temp 97.8°F | Resp 20 | Ht 61.5 in | Wt 251.0 lb

## 2013-10-17 DIAGNOSIS — F3289 Other specified depressive episodes: Secondary | ICD-10-CM

## 2013-10-17 DIAGNOSIS — F32A Depression, unspecified: Secondary | ICD-10-CM

## 2013-10-17 DIAGNOSIS — E669 Obesity, unspecified: Secondary | ICD-10-CM

## 2013-10-17 DIAGNOSIS — R32 Unspecified urinary incontinence: Secondary | ICD-10-CM

## 2013-10-17 DIAGNOSIS — I10 Essential (primary) hypertension: Secondary | ICD-10-CM

## 2013-10-17 DIAGNOSIS — F329 Major depressive disorder, single episode, unspecified: Secondary | ICD-10-CM

## 2013-10-17 DIAGNOSIS — Z8673 Personal history of transient ischemic attack (TIA), and cerebral infarction without residual deficits: Secondary | ICD-10-CM

## 2013-10-17 DIAGNOSIS — E119 Type 2 diabetes mellitus without complications: Secondary | ICD-10-CM

## 2013-10-17 MED ORDER — NITROGLYCERIN 0.4 MG SL SUBL: 0.4000 mg | SUBLINGUAL_TABLET | SUBLINGUAL | Status: DC | PRN

## 2013-10-17 NOTE — Patient Instructions (Signed)
Do kegals 10 times with each time you urinate Start the new bladder pill myrbetriq Take 2 of the blood pressure pills F/U 8 weeks

## 2013-10-17 NOTE — Progress Notes (Signed)
Patient ID: Gwendolyn Woodward, female   DOB: 06/23/1966, 48 y.o.   MRN: [id number]   Subjective:    Patient ID: Gwendolyn Woodward, female    DOB: 1966/06/04, 48 y.o.   MRN: [id number]  Patient presents for 2 week F/U, slight urinay incontinence, arm/ chest pain and advice  Pt here for interin follow-up on medications, her last visit she did not have any of her medications for hypertension diabetes or her depression. She was referred to psychiatry but she has not heard anything back yet. There was a disconnect because of her phone not having service  Urinary incontinence worse past few months, does not feel she empty's bladder and leaks on herself during the day and at night. Coughing causes incontinence  She also noted that her cousin was on a diet pill called Adipex and she lost 100 pounds over the past 2 years and wants to know if this is something that she could use.  She had another episode of arm pain the other week. No recent chest pain. She's had cardiac workup for this     Review Of Systems:  GEN- denies fatigue, fever, weight loss,weakness, recent illness HEENT- denies eye drainage, change in vision, nasal discharge, CVS- denies chest pain, palpitations RESP- denies SOB, cough, wheeze ABD- denies N/V, change in stools, abd pain GU- denies dysuria, hematuria, dribbling, incontinence MSK- denies joint pain, muscle aches, injury Neuro- denies headache, dizziness, syncope, seizure activity       Objective:    BP 148/86  Pulse 76  Temp(Src) 97.8 F (36.6 C)  Resp 20  Ht 5' 1.5" (1.562 m)  Wt 251 lb (113.853 kg)  BMI 46.66 kg/m2 GEN- NAD, alert and oriented x3 HEENT- PERRL, EOMI, non injected sclera, pink conjunctiva, MMM, oropharynx clear CVS- RRR, no murmur RESP-CTAB ABD-NABS,soft,NT,ND EXT- No edema Psych- Flat affect, not anxious appearing, good eye contact, normal speech        Assessment & Plan:      Problem List Items Addressed This Visit   None     Visit Diagnoses   Urinary incontinence    -  Primary    Relevant Medications       mirabegron ER (MYRBETRIQ) 25 MG TB24 tablet    Other Relevant Orders       Urinalysis, Routine w reflex microscopic       Note: This dictation was prepared with Dragon dictation along with smaller phrase technology. Any transcriptional errors that result from this process are unintentional.

## 2013-10-19 NOTE — Assessment & Plan Note (Signed)
I would not recommend diet pills for her at this time. She becomes more active this will improve as well as her food choices when she does the grocery store we discussed trying to get more fruits and vegetables at home instead of junkfood

## 2013-10-19 NOTE — Assessment & Plan Note (Signed)
History of CVA the goal is to keep her blood sugar as well as her blood pressure and lipid panel under control.

## 2013-10-19 NOTE — Assessment & Plan Note (Signed)
Her blood pressure continues to be elevated. The goal is to have her less than 130/80. I will increase her lisinopril HCTZ to 20/25 mg

## 2013-10-19 NOTE — Assessment & Plan Note (Signed)
She is back on her metformin her last A1c was 6% 2 weeks ago

## 2013-10-19 NOTE — Assessment & Plan Note (Signed)
Continue Prozac and trazodone. She would benefit from therapy as well as a group setting where she can be more actively involved. Currently she is pain in her home pretty much 24 hours a day.

## 2013-10-23 LAB — URINALYSIS, ROUTINE W REFLEX MICROSCOPIC
Bilirubin Urine: NEGATIVE
Glucose, UA: NEGATIVE mg/dL
Hgb urine dipstick: NEGATIVE
Ketones, ur: NEGATIVE mg/dL
Leukocytes, UA: NEGATIVE
Nitrite: NEGATIVE
Protein, ur: NEGATIVE mg/dL
Specific Gravity, Urine: 1.02 (ref 1.005–1.030)
Urobilinogen, UA: 0.2 mg/dL (ref 0.0–1.0)
pH: 7 (ref 5.0–8.0)

## 2013-12-12 ENCOUNTER — Encounter: Payer: Self-pay | Admitting: Family Medicine

## 2013-12-12 ENCOUNTER — Ambulatory Visit (INDEPENDENT_AMBULATORY_CARE_PROVIDER_SITE_OTHER): Payer: Medicare Other | Admitting: Family Medicine

## 2013-12-12 VITALS — BP 148/82 | HR 68 | Temp 97.4°F | Resp 16 | Ht 63.0 in | Wt 256.0 lb

## 2013-12-12 DIAGNOSIS — Z01419 Encounter for gynecological examination (general) (routine) without abnormal findings: Secondary | ICD-10-CM

## 2013-12-12 DIAGNOSIS — F329 Major depressive disorder, single episode, unspecified: Secondary | ICD-10-CM

## 2013-12-12 DIAGNOSIS — F32A Depression, unspecified: Secondary | ICD-10-CM

## 2013-12-12 DIAGNOSIS — R32 Unspecified urinary incontinence: Secondary | ICD-10-CM

## 2013-12-12 DIAGNOSIS — F3289 Other specified depressive episodes: Secondary | ICD-10-CM

## 2013-12-12 DIAGNOSIS — I10 Essential (primary) hypertension: Secondary | ICD-10-CM

## 2013-12-12 MED ORDER — LISINOPRIL-HYDROCHLOROTHIAZIDE 20-25 MG PO TABS: 1.0000 | ORAL_TABLET | Freq: Every day | ORAL | Status: DC

## 2013-12-12 MED ORDER — NITROGLYCERIN 0.4 MG SL SUBL: 0.4000 mg | SUBLINGUAL_TABLET | SUBLINGUAL | Status: DC | PRN

## 2013-12-12 NOTE — Patient Instructions (Signed)
Continue current medication New blood pressure medication-Lisinopril 20-25mg   We will call and check on transportation Referral for bladder evaluation and PAP Smear  F/U 3 months

## 2013-12-12 NOTE — Assessment & Plan Note (Signed)
Her blood pressure is about the the same however I'm not sure she is taking the increased dose to 20/25 mg from her last visit. We will call the pharmacy verify all of her medications

## 2013-12-12 NOTE — Progress Notes (Signed)
Patient ID: Gwendolyn Woodward, female   DOB: 17-Apr-1966, 48 y.o.   MRN: [id number]   Subjective:    Patient ID: Gwendolyn Woodward, female    DOB: 09/12/1965, 48 y.o.   MRN: [id number]  Patient presents for 8 week F/U  patient here for interim followup. She was seen at her last visit complaining of urinary incontinence she continues to have difficulties even though she's tried to keep all exercises and the bladder pill. She states that she feels like she may need to wear depends to keep her from leaking on herself  She is set up to see a therapist tomorrow morning for her depression. She's taking her medications as prescribed and states that they do help. She is happier when she has someone over at home and she is very lonesome she watches her grandchildren on the weekends which she enjoys.  Hypertension-she states she's taking medications as prescribed however she did not bring the bottles with her.  Diabetes mellitus-order for new meter was sent to her home however she has not received this yet. She is taking her medication as prescribed. [facility] following pt  Note her appointment was at 12:30 however the transportation service did not get her here until after 1:00 therefore she was not seen until 2:00    Review Of Systems:  GEN- denies fatigue, fever, weight loss,weakness, recent illness HEENT- denies eye drainage, change in vision, nasal discharge, CVS- denies chest pain, palpitations RESP- denies SOB, cough, wheeze ABD- denies N/V, change in stools, abd pain GU- denies dysuria, hematuria, dribbling,+ incontinence MSK- denies joint pain, muscle aches, injury Neuro- denies headache, dizziness, syncope, seizure activity       Objective:    BP 148/82  Pulse 68  Temp(Src) 97.4 F (36.3 C) (Oral)  Resp 16  Ht 5\' 3"  (1.6 m)  Wt 256 lb (116.121 kg)  BMI 45.36 kg/m2 GEN- NAD, alert and oriented x3 CVS- RRR, no murmur RESP-CTAB ABD-NABS,soft,NT,ND EXT- No edema Pulses- Radial  2+ PSYCH- Normal affect and mood        Assessment & Plan:      Problem List Items Addressed This Visit   None      Note: This dictation was prepared with Dragon dictation along with smaller phrase technology. Any transcriptional errors that result from this process are unintentional.

## 2013-12-12 NOTE — Assessment & Plan Note (Signed)
I will send her to GYN as she is overdue for Pap smear has had ablation and to have  her bladder checked. For now we'll hold off on medications as these do not help here

## 2013-12-12 NOTE — Assessment & Plan Note (Signed)
Continue current medications. She's appointment with psychiatry in the morning

## 2013-12-24 ENCOUNTER — Encounter: Payer: Self-pay | Admitting: *Deleted

## 2013-12-27 ENCOUNTER — Ambulatory Visit (INDEPENDENT_AMBULATORY_CARE_PROVIDER_SITE_OTHER): Payer: Medicare Other | Admitting: Obstetrics & Gynecology

## 2013-12-27 ENCOUNTER — Other Ambulatory Visit (HOSPITAL_COMMUNITY)
Admission: RE | Admit: 2013-12-27 | Discharge: 2013-12-27 | Disposition: A | Discharge status: Home / Self Care | Payer: Medicare Other | Source: Ambulatory Visit | Attending: Obstetrics & Gynecology | Admitting: Obstetrics & Gynecology

## 2013-12-27 ENCOUNTER — Encounter: Payer: Self-pay | Admitting: Obstetrics & Gynecology

## 2013-12-27 VITALS — BP 130/80 | Ht 62.0 in | Wt 247.0 lb

## 2013-12-27 DIAGNOSIS — Z124 Encounter for screening for malignant neoplasm of cervix: Secondary | ICD-10-CM

## 2013-12-27 DIAGNOSIS — Z1151 Encounter for screening for human papillomavirus (HPV): Secondary | ICD-10-CM | POA: Insufficient documentation

## 2013-12-27 DIAGNOSIS — N3941 Urge incontinence: Secondary | ICD-10-CM | POA: Insufficient documentation

## 2013-12-27 DIAGNOSIS — R87619 Unspecified abnormal cytological findings in specimens from cervix uteri: Secondary | ICD-10-CM | POA: Insufficient documentation

## 2013-12-27 MED ORDER — MIRABEGRON ER 50 MG PO TB24: 50.0000 mg | ORAL_TABLET | Freq: Every day | ORAL | Status: DC

## 2013-12-27 NOTE — Progress Notes (Signed)
Patient ID: Gwendolyn Woodward, female   DOB: 1966/05/07, 48 y.o.   MRN: [id number] Pt referred for evaluation of urinary incontinence. Pt states just wet all the time Not stress related, seems like dtrussor instability  Vulva is normal without lesions Vagina is pink moist without discharge Cervix normal in appearance and pap is done Uterus is normal Adnexa is negative with normal sized ovaries by sonogram  Start myrbetriq 50 qhs Follow up 6 weeks

## 2013-12-27 NOTE — Addendum Note (Signed)
Addended by: Farley Ly on: 12/27/2013 11:23 AM   Modules accepted: Orders

## 2013-12-31 ENCOUNTER — Telehealth: Payer: Self-pay | Admitting: *Deleted

## 2013-12-31 NOTE — Telephone Encounter (Signed)
Message copied by Sheral Flow on Mon Dec 31, 2013  3:50 PM ------      Message from: Devoria Glassing      Created: Mon Dec 31, 2013  1:41 PM       Janalyn Shy calling from [place] management wanting to talk with you about this patient [phone] ------
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
## 2014-01-01 NOTE — Telephone Encounter (Signed)
Note sent to [facility] care manager to F/U.

## 2014-01-01 NOTE — Telephone Encounter (Signed)
Noted, send another message to find out any further details

## 2014-01-02 NOTE — Telephone Encounter (Signed)
Call placed to [facility], [place].  [place].

## 2014-01-04 ENCOUNTER — Telehealth: Payer: Self-pay | Admitting: *Deleted

## 2014-01-04 NOTE — Telephone Encounter (Signed)
Orson Ape called stating that she has tried today numerous times to contact pt, she has left voicemails all day with her and no return call, Lars Mage stated she was going to work her in today for home visit but pt has not been very complient . Alisha wanted to let you know what is going on and will keep you updated.

## 2014-01-04 NOTE — Telephone Encounter (Signed)
Noted, received fax from [place] who did get in contact with pt

## 2014-01-07 NOTE — Telephone Encounter (Signed)
Delrae Rend [facility] Care Manager sent message to office that patient was contacted.   MD made aware.

## 2014-02-11 ENCOUNTER — Ambulatory Visit (INDEPENDENT_AMBULATORY_CARE_PROVIDER_SITE_OTHER): Payer: Medicare Other | Admitting: Obstetrics & Gynecology

## 2014-02-11 ENCOUNTER — Encounter: Payer: Self-pay | Admitting: Obstetrics & Gynecology

## 2014-02-11 VITALS — BP 140/90 | Wt 248.0 lb

## 2014-02-11 DIAGNOSIS — N3941 Urge incontinence: Secondary | ICD-10-CM

## 2014-02-11 MED ORDER — OXYBUTYNIN CHLORIDE ER 15 MG PO TB24: 15.0000 mg | ORAL_TABLET | Freq: Every day | ORAL | Status: DC

## 2014-02-11 NOTE — Progress Notes (Signed)
Patient ID: Gwendolyn Woodward, female   DOB: [birth year], 48 y.o.   MRN: [id number]    Please see note from 12/27/2013  Pt referred for evaluation of urinary incontinence. Pt states just wet all the time Not stress related, seems like dtrussor instability  Vulva is normal without lesions Vagina is pink moist without discharge Cervix normal in appearance and pap is done Uterus is normal Adnexa is negative with normal sized ovaries by sonogram  Start myrbetriq 50 qhs Follow up 6 weeks  Pt states she is no better on the myrbetriq at all, she can tell o difference Will change to ditropan xl 10 mg nightly Follow up in 6 weeks

## 2014-02-28 ENCOUNTER — Other Ambulatory Visit: Payer: Self-pay | Admitting: *Deleted

## 2014-02-28 MED ORDER — BLOOD GLUCOSE METER KIT: PACK | Status: DC

## 2014-03-01 ENCOUNTER — Encounter: Payer: Self-pay | Admitting: Family Medicine

## 2014-03-01 ENCOUNTER — Ambulatory Visit (INDEPENDENT_AMBULATORY_CARE_PROVIDER_SITE_OTHER): Payer: Medicare Other | Admitting: Family Medicine

## 2014-03-01 VITALS — BP 134/78 | HR 64 | Temp 97.5°F | Resp 14 | Ht 62.0 in | Wt 251.0 lb

## 2014-03-01 DIAGNOSIS — E119 Type 2 diabetes mellitus without complications: Secondary | ICD-10-CM

## 2014-03-01 DIAGNOSIS — F329 Major depressive disorder, single episode, unspecified: Secondary | ICD-10-CM

## 2014-03-01 DIAGNOSIS — M67919 Unspecified disorder of synovium and tendon, unspecified shoulder: Secondary | ICD-10-CM

## 2014-03-01 DIAGNOSIS — E785 Hyperlipidemia, unspecified: Secondary | ICD-10-CM

## 2014-03-01 DIAGNOSIS — N3941 Urge incontinence: Secondary | ICD-10-CM

## 2014-03-01 DIAGNOSIS — F39 Unspecified mood [affective] disorder: Secondary | ICD-10-CM

## 2014-03-01 DIAGNOSIS — M719 Bursopathy, unspecified: Secondary | ICD-10-CM

## 2014-03-01 DIAGNOSIS — E669 Obesity, unspecified: Secondary | ICD-10-CM

## 2014-03-01 DIAGNOSIS — F3289 Other specified depressive episodes: Secondary | ICD-10-CM

## 2014-03-01 DIAGNOSIS — F32A Depression, unspecified: Secondary | ICD-10-CM

## 2014-03-01 DIAGNOSIS — M75101 Unspecified rotator cuff tear or rupture of right shoulder, not specified as traumatic: Secondary | ICD-10-CM

## 2014-03-01 DIAGNOSIS — I10 Essential (primary) hypertension: Secondary | ICD-10-CM

## 2014-03-01 MED ORDER — NAPROXEN 500 MG PO TABS: 500.0000 mg | ORAL_TABLET | Freq: Two times a day (BID) | ORAL | Status: DC

## 2014-03-01 MED ORDER — HYDROCODONE-ACETAMINOPHEN 5-325 MG PO TABS: 1.0000 | ORAL_TABLET | Freq: Four times a day (QID) | ORAL | Status: DC | PRN

## 2014-03-01 MED ORDER — FLUOXETINE HCL 40 MG PO CAPS: 40.0000 mg | ORAL_CAPSULE | Freq: Every day | ORAL | Status: DC

## 2014-03-01 MED ORDER — TRAZODONE HCL 50 MG PO TABS: 50.0000 mg | ORAL_TABLET | Freq: Every evening | ORAL | Status: DC | PRN

## 2014-03-01 MED ORDER — METHOCARBAMOL 500 MG PO TABS: 500.0000 mg | ORAL_TABLET | Freq: Three times a day (TID) | ORAL | Status: DC | PRN

## 2014-03-01 NOTE — Patient Instructions (Addendum)
Prozac increased 40mg   We will call about medications Take medication for pain and inflammation in your shoulder  Your sugar should be less than 120 in the morning F/U 8 weeks
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
## 2014-03-02 DIAGNOSIS — F39 Unspecified mood [affective] disorder: Secondary | ICD-10-CM | POA: Insufficient documentation

## 2014-03-02 DIAGNOSIS — M75100 Unspecified rotator cuff tear or rupture of unspecified shoulder, not specified as traumatic: Secondary | ICD-10-CM | POA: Insufficient documentation

## 2014-03-02 LAB — LIPID PANEL
Cholesterol: 184 mg/dL (ref 0–200)
HDL: 66 mg/dL (ref >39)
LDL Cholesterol: 99 mg/dL (ref 0–99)
Total CHOL/HDL Ratio: 2.8 Ratio
Triglycerides: 96 mg/dL (ref <150)
VLDL: 19 mg/dL (ref 0–40)

## 2014-03-02 LAB — CBC WITH DIFFERENTIAL/PLATELET
Basophils Absolute: 0 10*3/uL (ref 0.0–0.1)
Basophils Relative: 0 % (ref 0–1)
Eosinophils Absolute: 0.1 10*3/uL (ref 0.0–0.7)
Eosinophils Relative: 2 % (ref 0–5)
HCT: 38.4 % (ref 36.0–46.0)
Hemoglobin: 12.7 g/dL (ref 12.0–15.0)
Lymphocytes Relative: 44 % (ref 12–46)
Lymphs Abs: 2.4 10*3/uL (ref 0.7–4.0)
MCH: 25.7 pg — ABNORMAL LOW (ref 26.0–34.0)
MCHC: 33.1 g/dL (ref 30.0–36.0)
MCV: 77.7 fL — ABNORMAL LOW (ref 78.0–100.0)
Monocytes Absolute: 0.3 10*3/uL (ref 0.1–1.0)
Monocytes Relative: 6 % (ref 3–12)
Neutro Abs: 2.6 10*3/uL (ref 1.7–7.7)
Neutrophils Relative %: 48 % (ref 43–77)
Platelets: 215 10*3/uL (ref 150–400)
RBC: 4.94 MIL/uL (ref 3.87–5.11)
RDW: 15.7 % — ABNORMAL HIGH (ref 11.5–15.5)
WBC: 5.4 10*3/uL (ref 4.0–10.5)

## 2014-03-02 LAB — COMPREHENSIVE METABOLIC PANEL
ALT: 16 U/L (ref 0–35)
AST: 14 U/L (ref 0–37)
Albumin: 3.8 g/dL (ref 3.5–5.2)
Alkaline Phosphatase: 84 U/L (ref 39–117)
BUN: 16 mg/dL (ref 6–23)
CO2: 27 mEq/L (ref 19–32)
Calcium: 8.9 mg/dL (ref 8.4–10.5)
Chloride: 105 mEq/L (ref 96–112)
Creat: 0.99 mg/dL (ref 0.50–1.10)
Glucose, Bld: 97 mg/dL (ref 70–99)
Potassium: 4.4 mEq/L (ref 3.5–5.3)
Sodium: 142 mEq/L (ref 135–145)
Total Bilirubin: 0.3 mg/dL (ref 0.2–1.2)
Total Protein: 6.9 g/dL (ref 6.0–8.3)

## 2014-03-02 LAB — HEMOGLOBIN A1C
Hgb A1c MFr Bld: 6.2 % — ABNORMAL HIGH (ref <5.7)
Mean Plasma Glucose: 131 mg/dL — ABNORMAL HIGH (ref <117)

## 2014-03-02 NOTE — Assessment & Plan Note (Signed)
She refuses to [facility] to [facility] We do not have a lot of options trying to get her help and getting help she will follow through with No SI/HI noted today I will increase prozac to 40mg  Continue trazodone for sleep

## 2014-03-02 NOTE — Assessment & Plan Note (Signed)
I provided documentation to the pharmacy and my nurse called to make sure she gets new Ditropan and her other medications, she is actually to go directly to pharmacy at end of this visit to collect her meds and diabetes supplies

## 2014-03-02 NOTE — Assessment & Plan Note (Addendum)
Acute shoulder pain based on exam concern for Rotator cuff syndrome, some decreaesd ROM and strength, other differential bursitis, will put her on NSAIDS, short course of hydrocodone, which she does tolerate, may need ortho, ROM

## 2014-03-02 NOTE — Assessment & Plan Note (Signed)
BP looks okay, no change to meds 

## 2014-03-02 NOTE — Assessment & Plan Note (Signed)
On Metformin, new meter sent again,with a copy of script with pt Check A1C and labs

## 2014-03-05 ENCOUNTER — Encounter: Payer: Self-pay | Admitting: *Deleted

## 2014-03-25 ENCOUNTER — Encounter: Payer: Self-pay | Admitting: Obstetrics & Gynecology

## 2014-03-25 ENCOUNTER — Ambulatory Visit (INDEPENDENT_AMBULATORY_CARE_PROVIDER_SITE_OTHER): Payer: Medicare Other | Admitting: Obstetrics & Gynecology

## 2014-03-25 VITALS — BP 162/98 | Ht 60.5 in | Wt 253.0 lb

## 2014-03-25 DIAGNOSIS — N3941 Urge incontinence: Secondary | ICD-10-CM

## 2014-03-25 NOTE — Progress Notes (Signed)
Patient ID: Gwendolyn Woodward, female   DOB: 06-09-1966, 48 y.o.   MRN: [id number] Pt had some issues with getting her ditropan XL 10 Only taking x 3 days Says she thinks she is doing a little better,   Continue and follo wup 3 months

## 2014-04-08 ENCOUNTER — Ambulatory Visit: Payer: Medicare Other | Admitting: Family Medicine

## 2014-04-12 ENCOUNTER — Telehealth: Payer: Self-pay | Admitting: Family Medicine

## 2014-04-12 ENCOUNTER — Ambulatory Visit: Payer: Medicare Other | Admitting: Family Medicine

## 2014-04-12 NOTE — Telephone Encounter (Signed)
Nicki Reaper forrest is calling to let you know that he is aware of this patient missing her last appointments, but would like you to know that she has been ready for these appointments but her transportation has not showed up the last two times [phone]

## 2014-04-12 NOTE — Telephone Encounter (Signed)
noted 

## 2014-04-12 NOTE — Telephone Encounter (Signed)
Call received from Janalyn Shy, [facility] nurse.   Reports that patient scheduled her own appointments and transportation, but the transportation never showed up.   States that she needs to be seen.   Appointment scheduled with Karis Juba, PA for next week.

## 2014-04-17 ENCOUNTER — Ambulatory Visit: Payer: Self-pay | Admitting: Physician Assistant

## 2014-05-03 ENCOUNTER — Ambulatory Visit: Payer: Medicare Other | Admitting: Family Medicine

## 2014-06-06 ENCOUNTER — Other Ambulatory Visit: Payer: Self-pay | Admitting: Family Medicine

## 2014-06-06 NOTE — Telephone Encounter (Signed)
Refill appropriate and filled per protocol. 

## 2014-06-24 ENCOUNTER — Encounter: Payer: Self-pay | Admitting: Obstetrics & Gynecology

## 2014-06-25 ENCOUNTER — Ambulatory Visit: Payer: Medicare Other | Admitting: Obstetrics & Gynecology

## 2014-07-05 ENCOUNTER — Ambulatory Visit (INDEPENDENT_AMBULATORY_CARE_PROVIDER_SITE_OTHER): Payer: Commercial Managed Care - HMO | Admitting: Family Medicine

## 2014-07-05 ENCOUNTER — Encounter: Payer: Self-pay | Admitting: Family Medicine

## 2014-07-05 VITALS — BP 126/80 | HR 72 | Temp 97.9°F | Resp 18 | Wt 253.0 lb

## 2014-07-05 DIAGNOSIS — J069 Acute upper respiratory infection, unspecified: Secondary | ICD-10-CM | POA: Insufficient documentation

## 2014-07-05 DIAGNOSIS — N3941 Urge incontinence: Secondary | ICD-10-CM

## 2014-07-05 DIAGNOSIS — M25561 Pain in right knee: Secondary | ICD-10-CM | POA: Insufficient documentation

## 2014-07-05 DIAGNOSIS — F32A Depression, unspecified: Secondary | ICD-10-CM

## 2014-07-05 DIAGNOSIS — F329 Major depressive disorder, single episode, unspecified: Secondary | ICD-10-CM

## 2014-07-05 DIAGNOSIS — I10 Essential (primary) hypertension: Secondary | ICD-10-CM

## 2014-07-05 DIAGNOSIS — M75101 Unspecified rotator cuff tear or rupture of right shoulder, not specified as traumatic: Secondary | ICD-10-CM

## 2014-07-05 DIAGNOSIS — E119 Type 2 diabetes mellitus without complications: Secondary | ICD-10-CM

## 2014-07-05 LAB — CBC WITH DIFFERENTIAL/PLATELET
Basophils Absolute: 0 10*3/uL (ref 0.0–0.1)
Basophils Relative: 0 % (ref 0–1)
Eosinophils Absolute: 0.4 10*3/uL (ref 0.0–0.7)
Eosinophils Relative: 6 % — ABNORMAL HIGH (ref 0–5)
HCT: 40.3 % (ref 36.0–46.0)
Hemoglobin: 13.1 g/dL (ref 12.0–15.0)
Lymphocytes Relative: 31 % (ref 12–46)
Lymphs Abs: 2.1 10*3/uL (ref 0.7–4.0)
MCH: 25.6 pg — ABNORMAL LOW (ref 26.0–34.0)
MCHC: 32.5 g/dL (ref 30.0–36.0)
MCV: 78.7 fL (ref 78.0–100.0)
Monocytes Absolute: 0.4 10*3/uL (ref 0.1–1.0)
Monocytes Relative: 6 % (ref 3–12)
Neutro Abs: 3.8 10*3/uL (ref 1.7–7.7)
Neutrophils Relative %: 57 % (ref 43–77)
Platelets: 194 10*3/uL (ref 150–400)
RBC: 5.12 MIL/uL — ABNORMAL HIGH (ref 3.87–5.11)
RDW: 14.9 % (ref 11.5–15.5)
WBC: 6.7 10*3/uL (ref 4.0–10.5)

## 2014-07-05 LAB — MICROALBUMIN / CREATININE URINE RATIO
Creatinine, Urine: 144.7 mg/dL
Microalb Creat Ratio: 3.5 mg/g (ref 0.0–30.0)
Microalb, Ur: 0.5 mg/dL (ref <2.0)

## 2014-07-05 LAB — COMPREHENSIVE METABOLIC PANEL
ALT: 14 U/L (ref 0–35)
AST: 11 U/L (ref 0–37)
Albumin: 3.8 g/dL (ref 3.5–5.2)
Alkaline Phosphatase: 101 U/L (ref 39–117)
BUN: 13 mg/dL (ref 6–23)
CO2: 28 mEq/L (ref 19–32)
Calcium: 9.4 mg/dL (ref 8.4–10.5)
Chloride: 105 mEq/L (ref 96–112)
Creat: 0.92 mg/dL (ref 0.50–1.10)
Glucose, Bld: 91 mg/dL (ref 70–99)
Potassium: 4.3 mEq/L (ref 3.5–5.3)
Sodium: 140 mEq/L (ref 135–145)
Total Bilirubin: 0.4 mg/dL (ref 0.2–1.2)
Total Protein: 7.2 g/dL (ref 6.0–8.3)

## 2014-07-05 LAB — HEMOGLOBIN A1C
Hgb A1c MFr Bld: 6.1 % — ABNORMAL HIGH (ref <5.7)
Mean Plasma Glucose: 128 mg/dL — ABNORMAL HIGH (ref <117)

## 2014-07-05 MED ORDER — HYDROCODONE-ACETAMINOPHEN 5-325 MG PO TABS: 1.0000 | ORAL_TABLET | Freq: Four times a day (QID) | ORAL | Status: DC | PRN

## 2014-07-05 MED ORDER — MIRABEGRON ER 25 MG PO TB24: 25.0000 mg | ORAL_TABLET | Freq: Every day | ORAL | Status: DC

## 2014-07-05 MED ORDER — NAPROXEN 500 MG PO TABS: 500.0000 mg | ORAL_TABLET | Freq: Two times a day (BID) | ORAL | Status: DC

## 2014-07-05 MED ORDER — AZITHROMYCIN 250 MG PO TABS: ORAL_TABLET | ORAL | Status: DC

## 2014-07-05 NOTE — Patient Instructions (Addendum)
Robitussin DM Take zpak as prescribed - antibiotic  Referral to orthopedics shot for shoulder and knee Pain medication prescribed Try the mirabetriq F/U 3 months

## 2014-07-05 NOTE — Progress Notes (Signed)
Patient ID: Gwendolyn Woodward, female   DOB: 1966-04-29, 48 y.o.   MRN: [id number]   Subjective:    Patient ID: Gwendolyn Woodward, female    DOB: 11-Oct-1965, 48 y.o.   MRN: [id number]  Patient presents for c/o rt knee/shoulder pain and Medication Refill patient here with cough with production worsening over the past week she's been using over-the-counter medication with minimal improvement she has not had any fever. The cough is keeping her up at night.  She also complains of right knee pain no specific injury but states that swells and feels like is going to give out on her. She also feels like her niece just to the side.  She has ongoing complaint of right shoulder pain this was evaluated back in July my diagnosis with rotator cuff syndrome at that time. She will like to proceed with steroid injection to see if it would help the shoulder. She has not had any imaging as we have significant amount of difficulty getting transportation for her.  Note she stopped taking her depression/anxiety meds  Review Of Systems:  GEN- denies fatigue, fever, weight loss,weakness, recent illness HEENT- denies eye drainage, change in vision, nasal discharge, CVS- denies chest pain, palpitations RESP- denies SOB, +cough, wheeze ABD- denies N/V, change in stools, abd pain GU- denies dysuria, hematuria, dribbling, incontinence MSK- + joint pain, muscle aches, injury Neuro- denies headache, dizziness, syncope, seizure activity       Objective:    BP 126/80 mmHg  Pulse 72  Temp(Src) 97.9 F (36.6 C) (Oral)  Resp 18  Wt 253 lb (114.76 kg) GEN- NAD, alert and oriented x3 HEENT- PERRL, EOMI, non injected sclera, pink conjunctiva, MMM, oropharynx clear Neck- Supple, no thyromegaly CVS- RRR, no murmur RESP-CTAB EXT- No edema MSK- Neck fiar ROM, TTP along right anterior neck into shoulder, shoulder- decreased ROM right side, +empty can, neg impingment signs, biceps in tact Right knee- normal inspection, no  effusion, fair ROM, patella treacks normally, +crepitus Psych- normal affect and mood, polite, no SI, no HI, normal speech Pulses- Radial, DP- 2+      Assessment & Plan:      Problem List Items Addressed This Visit    Rotator cuff syndrome   Right knee pain   Hypertension - Primary   Relevant Orders      CBC with Differential (Completed)      Comprehensive metabolic panel (Completed)   Diabetes mellitus   Relevant Orders      Hemoglobin A1c (Completed)      HM DIABETES FOOT EXAM (Completed)      Microalbumin / creatinine urine ratio   Acute URI   Relevant Medications      azithromycin (ZITHROMAX) tablet      Note: This dictation was prepared with Dragon dictation along with smaller phrase technology. Any transcriptional errors that result from this process are unintentional.

## 2014-07-06 NOTE — Assessment & Plan Note (Signed)
End of visit she asked to try myribetriq given samples of 25mg  D/c ditropan

## 2014-07-06 NOTE — Assessment & Plan Note (Signed)
Robitussin DM and  zpak with comorbidities

## 2014-07-06 NOTE — Assessment & Plan Note (Signed)
BP looks okay no change to meds

## 2014-07-06 NOTE — Assessment & Plan Note (Signed)
persistant pain, would benefit from steroid injection May need imaging of shoulder and knee at orthopedic office, has transporation issues so best to do at one place On NSAIDS, refilled hydrocodone

## 2014-07-06 NOTE — Assessment & Plan Note (Signed)
Discussed importance of taking meds as prescribed She has exhausted [facility] resources, declines seeing [facility]

## 2014-07-06 NOTE — Assessment & Plan Note (Signed)
On metformin, recheck a1c

## 2014-07-10 ENCOUNTER — Encounter: Payer: Self-pay | Admitting: Orthopedic Surgery

## 2014-08-06 ENCOUNTER — Ambulatory Visit: Payer: Medicare Other | Admitting: Orthopedic Surgery

## 2014-08-12 ENCOUNTER — Ambulatory Visit: Payer: Medicare Other | Admitting: Family Medicine

## 2014-08-20 ENCOUNTER — Ambulatory Visit (INDEPENDENT_AMBULATORY_CARE_PROVIDER_SITE_OTHER): Payer: Commercial Managed Care - HMO | Admitting: Orthopedic Surgery

## 2014-08-20 ENCOUNTER — Ambulatory Visit (INDEPENDENT_AMBULATORY_CARE_PROVIDER_SITE_OTHER): Payer: Commercial Managed Care - HMO

## 2014-08-20 ENCOUNTER — Encounter: Payer: Self-pay | Admitting: Orthopedic Surgery

## 2014-08-20 VITALS — BP 173/98 | Ht 62.0 in | Wt 253.0 lb

## 2014-08-20 DIAGNOSIS — M25511 Pain in right shoulder: Secondary | ICD-10-CM

## 2014-08-20 DIAGNOSIS — M75101 Unspecified rotator cuff tear or rupture of right shoulder, not specified as traumatic: Secondary | ICD-10-CM

## 2014-08-20 NOTE — Progress Notes (Signed)
Patient ID: Gwendolyn Woodward, female   DOB: Jun 13, 1966, 48 y.o.   MRN: [id number] Patient ID: Gwendolyn Woodward, female   DOB: April 10, 1966, 48 y.o.   MRN: [id number]  Chief Complaint  Patient presents with  . Shoulder Pain    Right shoulder pain, no injury. Referred by Dr. Buelah Manis.    HPI Gwendolyn Woodward is a 48 y.o. female.  Presents for evaluation of right shoulder and arm pain for 6 months duration. Pain is 8 out of 10. Pain is sharp aching area the pain is associated with giving out symptoms of the right arm and its of worse when she's lying on it at night. She does have some painful [place] elevation. She took naproxen, hydrocodone applied icy hot and took [facility] powders no relief no trauma   HPI  Past Medical History  Diagnosis Date  . Hypertension     Lab 09/2011: Normal CMet except glucose of 109-169 and albumin-3.4; normal BP 09/2011-12/2011  . Asthma   . CVA (cerebral infarction) 1984 , 1988    x2, left sided weakness, history of dizziness  . Sleep apnea     Does not use CPAP - no longer has CPAP machine  . History of anemia     S/p transfusions in 1999 at [facility] after vaginal bleeding; normal CBC and 09/2011  . Gastroesophageal reflux disease     no meds  . Headache(784.0)     OTC med PRN  . Arthritis     back pain and knee pain, no meds  . Depression with anxiety     History- no meds  . Chest pain     + dyspnea  . Dysphagia   . Hyperlipidemia     lipid profile in 11/2011: 203, 106, 58, 124  . Fasting hyperglycemia   . Stroke     Past Surgical History  Procedure Laterality Date  . Cesarean section  1984, 1987    x 2  . Wisdom tooth extraction    . Tubal ligation    . Iud removal  10/20/2011    Procedure: INTRAUTERINE DEVICE (IUD) REMOVAL;  Surgeon: Allena Katz, MD;  Location: [facility];  Service: Gynecology;  Laterality: N/A;  . Endometrial ablation  Feb 2013  . Esophagogastroduodenoscopy  5/11    [facility]-Dr Iva Lento GERD distal esophagus, NEGATIVE bx for  Barretts  . Colonoscopy  01/27/2012    Procedure: COLONOSCOPY;  Surgeon: Danie Binder, MD;  Location: [facility];  Service: Endoscopy;  Laterality: N/A;  8:30    Family History  Problem Relation Age of Onset  . Anesthesia problems Neg Hx   . Hypertension Mother   . Asthma Mother   . Cancer Father     Lung/Throat  . Heart disease Father   . Diabetes Maternal Uncle     Social History History  Substance Use Topics  . Smoking status: Never Smoker   . Smokeless tobacco: Never Used  . Alcohol Use: Yes     Comment: Socially, 2 times per yr    Allergies  Allergen Reactions  . Aspirin     INTERNAL BLEEDING  . Tramadol Itching    Current Outpatient Prescriptions  Medication Sig Dispense Refill  . HYDROcodone-acetaminophen (NORCO) 5-325 MG per tablet Take 1 tablet by mouth every 6 (six) hours as needed for moderate pain. 30 tablet 0  . naproxen (NAPROSYN) 500 MG tablet Take 1 tablet (500 mg total) by mouth 2 (two) times daily with a meal.  For shoulder 60 tablet 1  . pravastatin (PRAVACHOL) 10 MG tablet Take 1 tablet (10 mg total) by mouth daily. 30 tablet 3  . azithromycin (ZITHROMAX) 250 MG tablet Take 2 tablets x 1 day, then 1 tab daily for 4 days 6 tablet 0  . Blood Glucose Monitoring Suppl (BLOOD GLUCOSE METER KIT AND SUPPLIES) Dispense based on patient and insurance preference. Use to monitor FSBS once daily as directed. Dx: 250.00 1 each 0  . EASYMAX TEST test strip USE TO TEST BLOOD SUGAR ONCE DAILY AS DIRECTED. 50 each 11  . FLUoxetine (PROZAC) 40 MG capsule Take 1 capsule (40 mg total) by mouth daily. 30 capsule 3  . lisinopril-hydrochlorothiazide (PRINZIDE,ZESTORETIC) 20-25 MG per tablet Take 1 tablet by mouth daily. 30 tablet 3  . metFORMIN (GLUCOPHAGE-XR) 500 MG 24 hr tablet Take 1 tablet (500 mg total) by mouth daily with breakfast. 30 tablet 3  . methocarbamol (ROBAXIN) 500 MG tablet Take 1 tablet (500 mg total) by mouth 3 (three) times daily as needed (muscle spasm).  60 tablet 1  . mirabegron ER (MYRBETRIQ) 25 MG TB24 tablet Take 1 tablet (25 mg total) by mouth daily. 30 tablet   . nitroGLYCERIN (NITROSTAT) 0.4 MG SL tablet Place 1 tablet (0.4 mg total) under the tongue every 5 (five) minutes as needed for chest pain. 25 tablet 1  . omeprazole (PRILOSEC) 20 MG capsule Take 1 capsule (20 mg total) by mouth daily. 30 capsule 3  . oxybutynin (DITROPAN XL) 15 MG 24 hr tablet Take 1 tablet (15 mg total) by mouth at bedtime. 30 tablet 3  . traZODone (DESYREL) 50 MG tablet Take 1 tablet (50 mg total) by mouth at bedtime as needed for sleep. 30 tablet 3   No current facility-administered medications for this visit.    Review of Systems Review of Systems  HENT: Positive for dental problem and sinus pressure.   Respiratory: Positive for shortness of breath and wheezing.   Cardiovascular: Positive for chest pain and leg swelling.  Gastrointestinal: Positive for nausea.  Musculoskeletal: Positive for back pain and arthralgias.  Neurological: Positive for dizziness, weakness and numbness.  All other systems reviewed and are negative.   Blood pressure 173/98, height '5\' 2"'  (1.575 m), weight 253 lb (114.76 kg).  Physical Exam Physical Exam BP 173/98 mmHg  Ht '5\' 2"'  (1.575 m)  Wt 253 lb (114.76 kg)  BMI 46.26 kg/m2 She is obese. Grooming hygiene normal. No developmental abnormalities She is awake alert and oriented 3. Mood is pleasant affect is normal Ambulatory status is normal no assisted devices no limping Cervical spine is nontender. Right trapezius muscle tender with increased muscle tension. She has normal range of motion in the cervical spine. Skin over the cervical spine is normal. Thoracic spine no tenderness normal range of motion normal muscle tone and normal skin Left shoulder normal. Left arm neurovascular exam intact  Right shoulder full passive range of motion palpable. Acromial tenderness and tenderness in the right biceps. Strength in the  right shoulder is normal and the drop test is negative impingement sign is positive. No motor weakness. Skin normal. Pulse normal. Sensation normal. Reflexes 2+ and equal symmetric. Lymph nodes axilla right and left normal. Supraclavicular lymph nodes normal.   Data Reviewed Imaging today's x-ray I interpret: shows type I acromion with greater tuberosity sclerosis suggesting chronic rotator cuff impingement  Assessment    Encounter Diagnoses  Name Primary?  . Pain in joint, shoulder region, right   . Rotator  cuff syndrome of right shoulder Yes        Plan    Recommend subacromial injection and physical therapy continue Norco and naproxen no follow-up needed.  Procedure note the subacromial injection shoulder right subacromial space  Verbal consent was obtained to inject the  right  Shoulder  Timeout was completed to confirm the injection site is a subacromial space of the  right shoulder   Medication used Depo-Medrol 40 mg and lidocaine 1% 3 cc  Anesthesia was provided by ethyl chloride  The injection was performed in the RT posterior subacromial space. After pinning the skin with alcohol and anesthetized the skin with ethyl chloride the subacromial space was injected using a 20-gauge needle. There were no complications  Sterile dressing was applied.          Arther Abbott 08/20/2014, 10:02 AM

## 2014-08-20 NOTE — Patient Instructions (Signed)
Therapy appt  You have received an injection of steroids into the joint. 15% of patients will have increased pain within the 24 hours postinjection.   This is transient and will go away.   We recommend that you use ice packs on the injection site for 20 minutes every 2 hours and extra strength Tylenol 2 tablets every 8 as needed until the pain resolves.  If you continue to have pain after taking the Tylenol and using the ice please call the office for further instructions.

## 2014-08-29 ENCOUNTER — Encounter (HOSPITAL_COMMUNITY): Payer: Self-pay

## 2014-08-29 ENCOUNTER — Ambulatory Visit (HOSPITAL_COMMUNITY)
Admission: RE | Admit: 2014-08-29 | Discharge: 2014-08-29 | Disposition: A | Discharge status: Home / Self Care | Payer: Commercial Managed Care - HMO | Source: Ambulatory Visit | Attending: Orthopedic Surgery | Admitting: Orthopedic Surgery

## 2014-08-29 DIAGNOSIS — M25511 Pain in right shoulder: Secondary | ICD-10-CM | POA: Diagnosis not present

## 2014-08-29 DIAGNOSIS — R29898 Other symptoms and signs involving the musculoskeletal system: Secondary | ICD-10-CM | POA: Diagnosis not present

## 2014-08-29 DIAGNOSIS — M75101 Unspecified rotator cuff tear or rupture of right shoulder, not specified as traumatic: Secondary | ICD-10-CM | POA: Insufficient documentation

## 2014-08-29 NOTE — Patient Instructions (Signed)
SHOULDER: Flexion On Table   Place hands on table, elbows straight. Move hips away from body. Press hands down into table. Hold _5__ seconds. _10__ reps per set, __2_ sets per day, __7_ days per week  Abduction (Passive)   With arm out to side, resting on table, lower head toward arm, keeping trunk away from table. Hold __5__ seconds. Repeat _10___ times. Do __2__ sessions per day.      Internal Rotation (Assistive)   Seated with elbow bent at right angle and held against side, slide arm on table surface in an inward arc. Repeat _10___ times. Do __2__ sessions per day. Activity: Use this motion to brush crumbs off the table.
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
## 2014-09-04 ENCOUNTER — Ambulatory Visit (HOSPITAL_COMMUNITY)
Admission: RE | Admit: 2014-09-04 | Discharge: 2014-09-04 | Disposition: A | Discharge status: Home / Self Care | Payer: Commercial Managed Care - HMO | Source: Ambulatory Visit | Attending: Family Medicine | Admitting: Family Medicine

## 2014-09-04 DIAGNOSIS — M75101 Unspecified rotator cuff tear or rupture of right shoulder, not specified as traumatic: Secondary | ICD-10-CM | POA: Diagnosis not present

## 2014-09-04 DIAGNOSIS — R29898 Other symptoms and signs involving the musculoskeletal system: Secondary | ICD-10-CM

## 2014-09-04 DIAGNOSIS — M25511 Pain in right shoulder: Secondary | ICD-10-CM
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
## 2014-09-06 ENCOUNTER — Ambulatory Visit (HOSPITAL_COMMUNITY): Payer: Commercial Managed Care - HMO | Admitting: Specialist

## 2014-09-10 ENCOUNTER — Telehealth (HOSPITAL_COMMUNITY): Payer: Self-pay | Admitting: Specialist

## 2014-09-10 ENCOUNTER — Ambulatory Visit (HOSPITAL_COMMUNITY): Payer: Commercial Managed Care - HMO | Admitting: Specialist

## 2014-09-10 NOTE — Telephone Encounter (Signed)
Lack of Transportation [5]

## 2014-09-11 ENCOUNTER — Ambulatory Visit (HOSPITAL_COMMUNITY)
Admission: RE | Admit: 2014-09-11 | Discharge: 2014-09-11 | Disposition: A | Discharge status: Home / Self Care | Payer: Commercial Managed Care - HMO | Source: Ambulatory Visit | Attending: Orthopedic Surgery | Admitting: Orthopedic Surgery

## 2014-09-11 DIAGNOSIS — M25511 Pain in right shoulder: Secondary | ICD-10-CM

## 2014-09-11 DIAGNOSIS — M75101 Unspecified rotator cuff tear or rupture of right shoulder, not specified as traumatic: Secondary | ICD-10-CM | POA: Diagnosis not present

## 2014-09-11 DIAGNOSIS — R29898 Other symptoms and signs involving the musculoskeletal system: Secondary | ICD-10-CM
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
## 2014-09-17 ENCOUNTER — Ambulatory Visit (HOSPITAL_COMMUNITY): Payer: Commercial Managed Care - HMO | Admitting: Specialist

## 2014-09-18 ENCOUNTER — Ambulatory Visit (HOSPITAL_COMMUNITY): Payer: Commercial Managed Care - HMO

## 2014-09-20 ENCOUNTER — Encounter (HOSPITAL_COMMUNITY): Payer: Self-pay

## 2014-09-20 ENCOUNTER — Ambulatory Visit (HOSPITAL_COMMUNITY)
Admission: RE | Admit: 2014-09-20 | Discharge: 2014-09-20 | Disposition: A | Discharge status: Home / Self Care | Payer: Commercial Managed Care - HMO | Source: Ambulatory Visit | Attending: Orthopedic Surgery | Admitting: Orthopedic Surgery

## 2014-09-20 DIAGNOSIS — R29898 Other symptoms and signs involving the musculoskeletal system: Secondary | ICD-10-CM

## 2014-09-20 DIAGNOSIS — M75101 Unspecified rotator cuff tear or rupture of right shoulder, not specified as traumatic: Secondary | ICD-10-CM | POA: Diagnosis not present

## 2014-09-20 DIAGNOSIS — M25511 Pain in right shoulder: Secondary | ICD-10-CM
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
## 2014-09-23 ENCOUNTER — Ambulatory Visit (HOSPITAL_COMMUNITY): Payer: Commercial Managed Care - HMO | Admitting: Specialist

## 2014-09-25 ENCOUNTER — Ambulatory Visit (HOSPITAL_COMMUNITY)
Admission: RE | Admit: 2014-09-25 | Discharge: 2014-09-25 | Disposition: A | Discharge status: Home / Self Care | Payer: Commercial Managed Care - HMO | Source: Ambulatory Visit | Attending: Orthopedic Surgery | Admitting: Orthopedic Surgery

## 2014-09-25 DIAGNOSIS — M75101 Unspecified rotator cuff tear or rupture of right shoulder, not specified as traumatic: Secondary | ICD-10-CM

## 2014-09-25 DIAGNOSIS — R29898 Other symptoms and signs involving the musculoskeletal system: Secondary | ICD-10-CM | POA: Insufficient documentation

## 2014-09-25 DIAGNOSIS — M25511 Pain in right shoulder: Secondary | ICD-10-CM
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
## 2014-09-27 ENCOUNTER — Ambulatory Visit (HOSPITAL_COMMUNITY): Payer: Commercial Managed Care - HMO

## 2014-09-30 ENCOUNTER — Encounter (HOSPITAL_COMMUNITY): Payer: Commercial Managed Care - HMO | Admitting: Specialist

## 2014-09-30 ENCOUNTER — Ambulatory Visit (HOSPITAL_COMMUNITY)
Admission: RE | Admit: 2014-09-30 | Discharge: 2014-09-30 | Disposition: A | Discharge status: Home / Self Care | Payer: Commercial Managed Care - HMO | Source: Ambulatory Visit | Attending: Orthopedic Surgery | Admitting: Orthopedic Surgery

## 2014-09-30 DIAGNOSIS — R29898 Other symptoms and signs involving the musculoskeletal system: Secondary | ICD-10-CM

## 2014-09-30 DIAGNOSIS — M75101 Unspecified rotator cuff tear or rupture of right shoulder, not specified as traumatic: Secondary | ICD-10-CM | POA: Diagnosis not present

## 2014-09-30 DIAGNOSIS — M25511 Pain in right shoulder: Secondary | ICD-10-CM
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
## 2014-10-02 ENCOUNTER — Ambulatory Visit (HOSPITAL_COMMUNITY): Payer: Commercial Managed Care - HMO | Admitting: Specialist

## 2014-10-04 ENCOUNTER — Ambulatory Visit (HOSPITAL_COMMUNITY)
Admission: RE | Admit: 2014-10-04 | Discharge: 2014-10-04 | Disposition: A | Discharge status: Home / Self Care | Payer: Commercial Managed Care - HMO | Source: Ambulatory Visit | Attending: Orthopedic Surgery | Admitting: Orthopedic Surgery

## 2014-10-04 ENCOUNTER — Encounter (HOSPITAL_COMMUNITY): Payer: Self-pay

## 2014-10-04 DIAGNOSIS — M75101 Unspecified rotator cuff tear or rupture of right shoulder, not specified as traumatic: Secondary | ICD-10-CM | POA: Diagnosis not present

## 2014-10-04 DIAGNOSIS — R29898 Other symptoms and signs involving the musculoskeletal system: Secondary | ICD-10-CM
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
## 2014-10-09 ENCOUNTER — Ambulatory Visit (HOSPITAL_COMMUNITY): Payer: Medicaid Other | Attending: Orthopedic Surgery | Admitting: Specialist

## 2014-10-11 ENCOUNTER — Ambulatory Visit (HOSPITAL_COMMUNITY): Payer: Commercial Managed Care - HMO | Admitting: Specialist

## 2014-11-05 ENCOUNTER — Ambulatory Visit: Payer: Medicare Other | Admitting: Family Medicine

## 2014-11-07 ENCOUNTER — Encounter (HOSPITAL_COMMUNITY): Payer: Self-pay

## 2014-11-07 NOTE — Therapy (Signed)
[place], [place], [address] Phone: [phone]   Fax:  [phone]  Patient Details  Name: Gwendolyn Woodward MRN: [id number] Date of Birth: 1966-06-17 Referring Provider:  No ref. provider found  Encounter Date: 11/07/2014 OCCUPATIONAL THERAPY DISCHARGE SUMMARY  Visits from Start of Care: 7  Current functional level related to goals / functional outcomes: OT LONG TERM GOAL #1    Title Patient will return to highest level of independence with all daily tasks.    Status On-going   OT LONG TERM GOAL #2   Title Patient will increase AROM to [facility] to increase ability to complete overhead tasks with less difficulty.    Status On-going   OT LONG TERM GOAL #3   Title Patient will increase RUE strength to 4+/5 to increase ability to complete heavy lifting tasks.    Status On-going   OT LONG TERM GOAL #4   Title Patient will decrease pain to 3/10.   Status On-going   OT LONG TERM GOAL #5   Title Patient will decrease fascial restrictions to trace amount.    Status On-going          Remaining deficits: Patient did not return after her last tx session on 10/04/14. 2 no shows after that date. Patient was called and a message was left. Patient never returned therapist's call.   Education / Equipment: Ulnar nerve glides and table slides Plan: Patient agrees to discharge.  Patient goals were not met. Patient is being discharged due to not returning since the last visit.  ?????       Ailene Ravel, OTR/L,CBIS  [phone]  11/07/2014, 3:54 PM  [place] [address] [place], [place], [address] Phone: [phone]   Fax:  [phone]

## 2014-11-25 ENCOUNTER — Encounter: Payer: Self-pay | Admitting: Family Medicine

## 2014-11-25 ENCOUNTER — Ambulatory Visit (INDEPENDENT_AMBULATORY_CARE_PROVIDER_SITE_OTHER): Payer: Medicare Other | Admitting: Family Medicine

## 2014-11-25 ENCOUNTER — Other Ambulatory Visit: Payer: Self-pay | Admitting: Family Medicine

## 2014-11-25 VITALS — BP 136/88 | HR 82 | Temp 97.6°F | Resp 16 | Ht 62.0 in | Wt 261.0 lb

## 2014-11-25 DIAGNOSIS — F39 Unspecified mood [affective] disorder: Secondary | ICD-10-CM

## 2014-11-25 DIAGNOSIS — F329 Major depressive disorder, single episode, unspecified: Secondary | ICD-10-CM

## 2014-11-25 DIAGNOSIS — F32A Depression, unspecified: Secondary | ICD-10-CM

## 2014-11-25 DIAGNOSIS — E669 Obesity, unspecified: Secondary | ICD-10-CM | POA: Diagnosis not present

## 2014-11-25 DIAGNOSIS — M75100 Unspecified rotator cuff tear or rupture of unspecified shoulder, not specified as traumatic: Secondary | ICD-10-CM | POA: Diagnosis not present

## 2014-11-25 DIAGNOSIS — I10 Essential (primary) hypertension: Secondary | ICD-10-CM

## 2014-11-25 DIAGNOSIS — E119 Type 2 diabetes mellitus without complications: Secondary | ICD-10-CM

## 2014-11-25 DIAGNOSIS — B353 Tinea pedis: Secondary | ICD-10-CM | POA: Diagnosis not present

## 2014-11-25 DIAGNOSIS — E785 Hyperlipidemia, unspecified: Secondary | ICD-10-CM

## 2014-11-25 LAB — CBC WITH DIFFERENTIAL/PLATELET
Basophils Absolute: 0 10*3/uL (ref 0.0–0.1)
Basophils Relative: 0 % (ref 0–1)
Eosinophils Absolute: 0.1 10*3/uL (ref 0.0–0.7)
Eosinophils Relative: 2 % (ref 0–5)
HCT: 39.2 % (ref 36.0–46.0)
Hemoglobin: 12.5 g/dL (ref 12.0–15.0)
Lymphocytes Relative: 43 % (ref 12–46)
Lymphs Abs: 2.2 10*3/uL (ref 0.7–4.0)
MCH: 25 pg — ABNORMAL LOW (ref 26.0–34.0)
MCHC: 31.9 g/dL (ref 30.0–36.0)
MCV: 78.4 fL (ref 78.0–100.0)
MPV: 10.6 fL (ref 8.6–12.4)
Monocytes Absolute: 0.3 10*3/uL (ref 0.1–1.0)
Monocytes Relative: 6 % (ref 3–12)
Neutro Abs: 2.5 10*3/uL (ref 1.7–7.7)
Neutrophils Relative %: 49 % (ref 43–77)
Platelets: 214 10*3/uL (ref 150–400)
RBC: 5 MIL/uL (ref 3.87–5.11)
RDW: 15.8 % — ABNORMAL HIGH (ref 11.5–15.5)
WBC: 5.2 10*3/uL (ref 4.0–10.5)

## 2014-11-25 LAB — LIPID PANEL
Cholesterol: 184 mg/dL (ref 0–200)
HDL: 58 mg/dL (ref >=46)
LDL Cholesterol: 108 mg/dL — ABNORMAL HIGH (ref 0–99)
Total CHOL/HDL Ratio: 3.2 Ratio
Triglycerides: 90 mg/dL (ref <150)
VLDL: 18 mg/dL (ref 0–40)

## 2014-11-25 LAB — COMPREHENSIVE METABOLIC PANEL
ALT: 13 U/L (ref 0–35)
AST: 15 U/L (ref 0–37)
Albumin: 3.9 g/dL (ref 3.5–5.2)
Alkaline Phosphatase: 92 U/L (ref 39–117)
BUN: 13 mg/dL (ref 6–23)
CO2: 26 mEq/L (ref 19–32)
Calcium: 9.3 mg/dL (ref 8.4–10.5)
Chloride: 104 mEq/L (ref 96–112)
Creat: 1.01 mg/dL (ref 0.50–1.10)
Glucose, Bld: 92 mg/dL (ref 70–99)
Potassium: 4.2 mEq/L (ref 3.5–5.3)
Sodium: 141 mEq/L (ref 135–145)
Total Bilirubin: 0.4 mg/dL (ref 0.2–1.2)
Total Protein: 7.2 g/dL (ref 6.0–8.3)

## 2014-11-25 LAB — TSH: TSH: 0.811 u[IU]/mL (ref 0.350–4.500)

## 2014-11-25 MED ORDER — METHOCARBAMOL 500 MG PO TABS: 500.0000 mg | ORAL_TABLET | Freq: Three times a day (TID) | ORAL | Status: DC | PRN

## 2014-11-25 MED ORDER — FLUOXETINE HCL 40 MG PO CAPS: 40.0000 mg | ORAL_CAPSULE | Freq: Every day | ORAL | Status: DC

## 2014-11-25 MED ORDER — FLUOXETINE HCL 20 MG PO CAPS: ORAL_CAPSULE | ORAL | Status: DC

## 2014-11-25 MED ORDER — HYDROCODONE-ACETAMINOPHEN 5-325 MG PO TABS: 1.0000 | ORAL_TABLET | Freq: Four times a day (QID) | ORAL | Status: DC | PRN

## 2014-11-25 MED ORDER — LISINOPRIL-HYDROCHLOROTHIAZIDE 20-25 MG PO TABS
1.0000 | ORAL_TABLET | Freq: Every day | ORAL | Status: DC
Start: 2014-11-25 — End: 2015-01-03

## 2014-11-25 MED ORDER — CLOTRIMAZOLE 1 % EX CREA: 1.0000 "application " | TOPICAL_CREAM | Freq: Two times a day (BID) | CUTANEOUS | Status: DC

## 2014-11-25 MED ORDER — METFORMIN HCL ER 500 MG PO TB24: 500.0000 mg | ORAL_TABLET | Freq: Every day | ORAL | Status: DC

## 2014-11-25 NOTE — Assessment & Plan Note (Signed)
Check fasting labs continue metformin

## 2014-11-25 NOTE — Assessment & Plan Note (Signed)
Persistent pain in her shoulder. She's been seen by orthopedics we will need to get her reestablished but I need to get her medications and things on track first. She does have Naprosyn and a little hydrocodone leftover I did refill this with 30 tablets
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
## 2014-11-25 NOTE — Assessment & Plan Note (Signed)
Blood pressure looked okay today, I want to restart her lisinopril HCTZ the ones that she had were actually expired not sure she was taking

## 2014-11-25 NOTE — Patient Instructions (Addendum)
Restart prozac total is 60mg  ( Take 40mg  with a 20mg  tablet) Take metformin for your diabetes Take blood pressure medication For your shoulder- Take the naprosyn twice a day with food for inflammation Take the hydrocodone F/U in 1 week to discuss labs and recheck medications

## 2014-11-25 NOTE — Progress Notes (Signed)
Patient ID: Gwendolyn Woodward, female   DOB: August 04, 1966, 49 y.o.   MRN: [id number]   Subjective:    Patient ID: Gwendolyn Woodward, female    DOB: 07/14/1966, 49 y.o.   MRN: [id number]  Patient presents for Dizzy Spells and Pain  Patient to follow-up chronic medical problems. She's not been seen in about 6 months for unclear reasons. She is here today with a family friend who also acts as a CNA and will like to start personal care services for her. She states she has not felt well sometimes she gets dizzy and sees spots she also continues to have pain in her shoulders which she has seen orthopedics for but did not follow-up after physical therapy. She is history of diabetes mellitus hypertension hyperlipidemia history of a stroke. She also has history of uncontrolled depression and she is no longer following with the psychiatrist or any therapist. On review of her medications for her medications were expired the other 3 bottles that she had were not expired however they were from July 2015. One we called the pharmacy she not had anything refilled since November 2015 and that seemed to be nitroglycerin sublingual.  She states that she has been checking her sugar but did not bring her meter with her today states that they range from [place])   Review Of Systems:  GEN- +fatigue, fever, weight loss,weakness, recent illness HEENT- denies eye drainage, change in vision, nasal discharge, CVS- denies chest pain, palpitations RESP- denies SOB, cough, wheeze ABD- denies N/V, change in stools, abd pain GU- denies dysuria, hematuria, dribbling, incontinence MSK- +joint pain, muscle aches, injury Neuro- denies headache, dizziness, syncope, seizure activity       Objective:    BP 136/88 mmHg  Pulse 82  Temp(Src) 97.6 F (36.4 C) (Oral)  Resp 16  Ht 5\' 2"  (1.575 m)  Wt 261 lb (118.389 kg)  BMI 47.73 kg/m2 GEN- NAD, alert and oriented x3 HEENT- PERRL, EOMI,  non injected sclera, pink conjunctiva, MMM, oropharynx clear Neck- Supple, no thyromegaly, no bruit CVS- RRR, no murmur RESP-CTAB EXT- No edema Psych- normal affect and mood, polite, no SI, no HI, normal speech Pulses- Radial, DP- 2+, peeling skin with odor between toes, thick toenails     Assessment & Plan:      Problem List Items Addressed This Visit      Unprioritized   Hypertension   Relevant Medications   lisinopril-hydrochlorothiazide (PRINZIDE,ZESTORETIC) 20-25 MG per tablet   Other Relevant Orders   TSH   Hyperlipidemia   Relevant Medications   lisinopril-hydrochlorothiazide (PRINZIDE,ZESTORETIC) 20-25 MG per tablet   Other Relevant Orders   Lipid panel   Diabetes mellitus   Relevant Medications   lisinopril-hydrochlorothiazide (PRINZIDE,ZESTORETIC) 20-25 MG per tablet   metFORMIN (GLUCOPHAGE-XR) 24 hr tablet   Other Relevant Orders   CBC with Differential/Platelet   Comprehensive metabolic panel   Microalbumin / creatinine urine ratio   Depression - Primary   Relevant Medications   FLUoxetine (PROZAC) capsule   FLUoxetine (PROZAC) 40 MG capsule      Note: This dictation was prepared with Dragon dictation along with smaller phrase technology. Any transcriptional errors that result from this process are unintentional.

## 2014-11-25 NOTE — Assessment & Plan Note (Signed)
She states that she has been taking 40 mg Prozac it is very difficult as all the prescriptions or near out of date and she's not had a refilled recently. She is supposeto be on 60 mg of Prozac.  I will go ahead and add the additional 20 mg and she is sure that she has been on the 40 mg recently

## 2014-11-26 LAB — MICROALBUMIN / CREATININE URINE RATIO
Creatinine, Urine: 119.9 mg/dL
Microalb Creat Ratio: 5.8 mg/g (ref 0.0–30.0)
Microalb, Ur: 0.7 mg/dL (ref <2.0)

## 2014-11-26 LAB — HEMOGLOBIN A1C
Hgb A1c MFr Bld: 6.4 % — ABNORMAL HIGH (ref <5.7)
Mean Plasma Glucose: 137 mg/dL — ABNORMAL HIGH (ref <117)

## 2014-12-02 ENCOUNTER — Ambulatory Visit (INDEPENDENT_AMBULATORY_CARE_PROVIDER_SITE_OTHER): Payer: Medicare Other | Admitting: Family Medicine

## 2014-12-02 ENCOUNTER — Encounter: Payer: Self-pay | Admitting: Family Medicine

## 2014-12-02 VITALS — BP 130/80 | HR 78 | Temp 97.7°F | Resp 16 | Ht 62.0 in | Wt 254.0 lb

## 2014-12-02 DIAGNOSIS — I10 Essential (primary) hypertension: Secondary | ICD-10-CM | POA: Diagnosis not present

## 2014-12-02 DIAGNOSIS — E785 Hyperlipidemia, unspecified: Secondary | ICD-10-CM

## 2014-12-02 DIAGNOSIS — E119 Type 2 diabetes mellitus without complications: Secondary | ICD-10-CM

## 2014-12-02 MED ORDER — PRAVASTATIN SODIUM 20 MG PO TABS: 20.0000 mg | ORAL_TABLET | Freq: Every day | ORAL | Status: DC

## 2014-12-02 NOTE — Assessment & Plan Note (Signed)
Diabetes is fairly well controlled we discussed dietary changes which her new aide is going to help her with. I will continue the metformin at the current dose. She declines Pneumovax. She is on ACE inhibitor and will restart statin drug

## 2014-12-02 NOTE — Assessment & Plan Note (Signed)
Blood pressure is well controlled 

## 2014-12-02 NOTE — Progress Notes (Signed)
Patient ID: Gwendolyn Woodward, female   DOB: 1965/09/28, 49 y.o.   MRN: [id number]   Subjective:    Patient ID: Gwendolyn Woodward, female    DOB: 1966-05-24, 49 y.o.   MRN: [id number]  Patient presents for 1 week F/U  patient here for interim follow-up on her medications as well as her labs. I saw her last week to reestablish her care and get her medications back on track. She had multiple expired medications at that time. She is here today with a family friend Ms. Nori Riis who is going to help provide her with personal care services as well. She now has all of the correct medications with the exception of her pravastatin and this has expired. I reviewed her fasting labs with her her A1c had one up some to 6.4% but still fairly well controlled. Her LDL was also minimally elevated. Other labs were unremarkable    Review Of Systems:  GEN- denies fatigue, fever, weight loss,weakness, recent illness HEENT- denies eye drainage, change in vision, nasal discharge, CVS- denies chest pain, palpitations RESP- denies SOB, cough, wheeze ABD- denies N/V, change in stools, abd pain GU- denies dysuria, hematuria, dribbling, incontinence MSK- + joint pain, muscle aches, injury Neuro- denies headache, dizziness, syncope, seizure activity       Objective:    BP 130/80 mmHg  Pulse 78  Temp(Src) 97.7 F (36.5 C) (Oral)  Resp 16  Ht 5\' 2"  (1.575 m)  Wt 254 lb (115.214 kg)  BMI 46.45 kg/m2 GEN- NAD, alert and oriented x3 CVS- RRR, no murmur RESP-CTAB Pulses- Radial,2+        Assessment & Plan:      Problem List Items Addressed This Visit    None      Note: This dictation was prepared with Dragon dictation along with smaller phrase technology. Any transcriptional errors that result from this process are unintentional.

## 2014-12-02 NOTE — Patient Instructions (Signed)
Start the cholesterol medication  Your labs look better Stop the diet pills  F/U 3 months

## 2014-12-02 NOTE — Assessment & Plan Note (Signed)
Restart pravastatin will start her at 20 mg at bedtime

## 2014-12-03 ENCOUNTER — Other Ambulatory Visit: Payer: Self-pay | Admitting: *Deleted

## 2014-12-03 MED ORDER — GLUCOSE BLOOD VI STRP: ORAL_STRIP | Status: DC

## 2014-12-11 DIAGNOSIS — I10 Essential (primary) hypertension: Secondary | ICD-10-CM | POA: Diagnosis not present

## 2014-12-12 DIAGNOSIS — I10 Essential (primary) hypertension: Secondary | ICD-10-CM | POA: Diagnosis not present

## 2014-12-13 DIAGNOSIS — I10 Essential (primary) hypertension: Secondary | ICD-10-CM | POA: Diagnosis not present

## 2014-12-14 DIAGNOSIS — I10 Essential (primary) hypertension: Secondary | ICD-10-CM | POA: Diagnosis not present

## 2014-12-15 DIAGNOSIS — I10 Essential (primary) hypertension: Secondary | ICD-10-CM | POA: Diagnosis not present

## 2014-12-16 ENCOUNTER — Encounter: Payer: Self-pay | Admitting: Family Medicine

## 2014-12-16 DIAGNOSIS — I10 Essential (primary) hypertension: Secondary | ICD-10-CM | POA: Diagnosis not present

## 2014-12-17 DIAGNOSIS — I10 Essential (primary) hypertension: Secondary | ICD-10-CM | POA: Diagnosis not present

## 2014-12-18 DIAGNOSIS — I10 Essential (primary) hypertension: Secondary | ICD-10-CM | POA: Diagnosis not present

## 2014-12-19 DIAGNOSIS — I10 Essential (primary) hypertension: Secondary | ICD-10-CM | POA: Diagnosis not present

## 2014-12-20 DIAGNOSIS — I10 Essential (primary) hypertension: Secondary | ICD-10-CM | POA: Diagnosis not present

## 2014-12-21 DIAGNOSIS — I10 Essential (primary) hypertension: Secondary | ICD-10-CM | POA: Diagnosis not present

## 2014-12-22 DIAGNOSIS — I10 Essential (primary) hypertension: Secondary | ICD-10-CM | POA: Diagnosis not present

## 2014-12-23 DIAGNOSIS — I10 Essential (primary) hypertension: Secondary | ICD-10-CM | POA: Diagnosis not present

## 2014-12-24 DIAGNOSIS — I10 Essential (primary) hypertension: Secondary | ICD-10-CM | POA: Diagnosis not present

## 2014-12-25 DIAGNOSIS — I10 Essential (primary) hypertension: Secondary | ICD-10-CM | POA: Diagnosis not present

## 2014-12-26 DIAGNOSIS — I10 Essential (primary) hypertension: Secondary | ICD-10-CM | POA: Diagnosis not present

## 2014-12-27 DIAGNOSIS — I10 Essential (primary) hypertension: Secondary | ICD-10-CM | POA: Diagnosis not present

## 2014-12-28 DIAGNOSIS — I10 Essential (primary) hypertension: Secondary | ICD-10-CM | POA: Diagnosis not present

## 2014-12-29 DIAGNOSIS — I10 Essential (primary) hypertension: Secondary | ICD-10-CM | POA: Diagnosis not present

## 2014-12-30 DIAGNOSIS — I10 Essential (primary) hypertension: Secondary | ICD-10-CM | POA: Diagnosis not present

## 2014-12-31 DIAGNOSIS — I10 Essential (primary) hypertension: Secondary | ICD-10-CM | POA: Diagnosis not present

## 2015-01-01 DIAGNOSIS — I10 Essential (primary) hypertension: Secondary | ICD-10-CM | POA: Diagnosis not present

## 2015-01-02 DIAGNOSIS — I10 Essential (primary) hypertension: Secondary | ICD-10-CM | POA: Diagnosis not present

## 2015-01-03 ENCOUNTER — Other Ambulatory Visit: Payer: Self-pay | Admitting: *Deleted

## 2015-01-03 DIAGNOSIS — I10 Essential (primary) hypertension: Secondary | ICD-10-CM | POA: Diagnosis not present

## 2015-01-03 MED ORDER — METFORMIN HCL ER 500 MG PO TB24: 500.0000 mg | ORAL_TABLET | Freq: Every day | ORAL | Status: DC

## 2015-01-03 MED ORDER — LISINOPRIL-HYDROCHLOROTHIAZIDE 20-25 MG PO TABS: 1.0000 | ORAL_TABLET | Freq: Every day | ORAL | Status: DC

## 2015-01-03 MED ORDER — PRAVASTATIN SODIUM 20 MG PO TABS: 20.0000 mg | ORAL_TABLET | Freq: Every day | ORAL | Status: DC

## 2015-01-03 NOTE — Telephone Encounter (Signed)
Received fax requesting refill on Lisinopril/HCTZ, Metformin, and Pravastatin.   Refill appropriate and filled per protocol.

## 2015-01-04 DIAGNOSIS — I10 Essential (primary) hypertension: Secondary | ICD-10-CM | POA: Diagnosis not present

## 2015-01-05 DIAGNOSIS — I10 Essential (primary) hypertension: Secondary | ICD-10-CM | POA: Diagnosis not present

## 2015-01-06 DIAGNOSIS — I10 Essential (primary) hypertension: Secondary | ICD-10-CM | POA: Diagnosis not present

## 2015-01-07 DIAGNOSIS — I10 Essential (primary) hypertension: Secondary | ICD-10-CM | POA: Diagnosis not present

## 2015-01-08 DIAGNOSIS — I10 Essential (primary) hypertension: Secondary | ICD-10-CM | POA: Diagnosis not present

## 2015-01-09 DIAGNOSIS — I10 Essential (primary) hypertension: Secondary | ICD-10-CM | POA: Diagnosis not present

## 2015-01-10 DIAGNOSIS — I10 Essential (primary) hypertension: Secondary | ICD-10-CM | POA: Diagnosis not present

## 2015-01-11 DIAGNOSIS — I10 Essential (primary) hypertension: Secondary | ICD-10-CM | POA: Diagnosis not present

## 2015-01-12 DIAGNOSIS — I10 Essential (primary) hypertension: Secondary | ICD-10-CM | POA: Diagnosis not present

## 2015-01-13 DIAGNOSIS — I10 Essential (primary) hypertension: Secondary | ICD-10-CM | POA: Diagnosis not present

## 2015-01-14 DIAGNOSIS — I10 Essential (primary) hypertension: Secondary | ICD-10-CM | POA: Diagnosis not present

## 2015-01-15 DIAGNOSIS — I10 Essential (primary) hypertension: Secondary | ICD-10-CM | POA: Diagnosis not present

## 2015-01-16 DIAGNOSIS — I10 Essential (primary) hypertension: Secondary | ICD-10-CM | POA: Diagnosis not present

## 2015-01-17 DIAGNOSIS — I10 Essential (primary) hypertension: Secondary | ICD-10-CM | POA: Diagnosis not present

## 2015-01-18 DIAGNOSIS — I10 Essential (primary) hypertension: Secondary | ICD-10-CM | POA: Diagnosis not present

## 2015-01-19 DIAGNOSIS — I10 Essential (primary) hypertension: Secondary | ICD-10-CM | POA: Diagnosis not present

## 2015-01-20 DIAGNOSIS — I10 Essential (primary) hypertension: Secondary | ICD-10-CM | POA: Diagnosis not present

## 2015-01-21 DIAGNOSIS — I10 Essential (primary) hypertension: Secondary | ICD-10-CM | POA: Diagnosis not present

## 2015-01-22 DIAGNOSIS — I10 Essential (primary) hypertension: Secondary | ICD-10-CM | POA: Diagnosis not present

## 2015-01-23 DIAGNOSIS — I10 Essential (primary) hypertension: Secondary | ICD-10-CM | POA: Diagnosis not present

## 2015-01-24 DIAGNOSIS — I10 Essential (primary) hypertension: Secondary | ICD-10-CM | POA: Diagnosis not present

## 2015-01-25 DIAGNOSIS — I10 Essential (primary) hypertension: Secondary | ICD-10-CM | POA: Diagnosis not present

## 2015-01-26 DIAGNOSIS — I10 Essential (primary) hypertension: Secondary | ICD-10-CM | POA: Diagnosis not present

## 2015-01-27 DIAGNOSIS — I10 Essential (primary) hypertension: Secondary | ICD-10-CM | POA: Diagnosis not present

## 2015-01-28 DIAGNOSIS — I10 Essential (primary) hypertension: Secondary | ICD-10-CM | POA: Diagnosis not present

## 2015-01-29 DIAGNOSIS — I10 Essential (primary) hypertension: Secondary | ICD-10-CM | POA: Diagnosis not present

## 2015-01-30 DIAGNOSIS — I10 Essential (primary) hypertension: Secondary | ICD-10-CM | POA: Diagnosis not present

## 2015-01-31 DIAGNOSIS — I10 Essential (primary) hypertension: Secondary | ICD-10-CM | POA: Diagnosis not present

## 2015-02-01 DIAGNOSIS — I10 Essential (primary) hypertension: Secondary | ICD-10-CM | POA: Diagnosis not present

## 2015-02-02 DIAGNOSIS — I10 Essential (primary) hypertension: Secondary | ICD-10-CM | POA: Diagnosis not present

## 2015-02-03 DIAGNOSIS — I10 Essential (primary) hypertension: Secondary | ICD-10-CM | POA: Diagnosis not present

## 2015-02-04 DIAGNOSIS — I10 Essential (primary) hypertension: Secondary | ICD-10-CM | POA: Diagnosis not present

## 2015-02-05 DIAGNOSIS — I10 Essential (primary) hypertension: Secondary | ICD-10-CM | POA: Diagnosis not present

## 2015-02-06 DIAGNOSIS — I10 Essential (primary) hypertension: Secondary | ICD-10-CM | POA: Diagnosis not present

## 2015-02-07 DIAGNOSIS — I10 Essential (primary) hypertension: Secondary | ICD-10-CM | POA: Diagnosis not present

## 2015-02-08 DIAGNOSIS — I10 Essential (primary) hypertension: Secondary | ICD-10-CM | POA: Diagnosis not present

## 2015-02-09 DIAGNOSIS — I10 Essential (primary) hypertension: Secondary | ICD-10-CM | POA: Diagnosis not present

## 2015-03-04 ENCOUNTER — Ambulatory Visit: Payer: Medicare Other | Admitting: Family Medicine

## 2015-03-10 ENCOUNTER — Ambulatory Visit: Payer: Medicare Other | Admitting: Family Medicine

## 2015-03-21 ENCOUNTER — Ambulatory Visit: Payer: Medicare Other | Admitting: Family Medicine

## 2015-03-25 ENCOUNTER — Ambulatory Visit: Payer: Self-pay | Admitting: Family Medicine

## 2015-04-18 ENCOUNTER — Ambulatory Visit: Payer: Self-pay | Admitting: Family Medicine

## 2015-05-09 ENCOUNTER — Ambulatory Visit (INDEPENDENT_AMBULATORY_CARE_PROVIDER_SITE_OTHER): Payer: Medicare Other | Admitting: Family Medicine

## 2015-05-09 ENCOUNTER — Encounter: Payer: Self-pay | Admitting: Family Medicine

## 2015-05-09 VITALS — BP 132/68 | HR 78 | Temp 97.9°F | Resp 14 | Ht 62.0 in | Wt 253.0 lb

## 2015-05-09 DIAGNOSIS — K219 Gastro-esophageal reflux disease without esophagitis: Secondary | ICD-10-CM

## 2015-05-09 DIAGNOSIS — E669 Obesity, unspecified: Secondary | ICD-10-CM | POA: Diagnosis not present

## 2015-05-09 DIAGNOSIS — I10 Essential (primary) hypertension: Secondary | ICD-10-CM | POA: Diagnosis not present

## 2015-05-09 DIAGNOSIS — E119 Type 2 diabetes mellitus without complications: Secondary | ICD-10-CM | POA: Diagnosis not present

## 2015-05-09 DIAGNOSIS — F329 Major depressive disorder, single episode, unspecified: Secondary | ICD-10-CM

## 2015-05-09 DIAGNOSIS — F32A Depression, unspecified: Secondary | ICD-10-CM

## 2015-05-09 LAB — COMPREHENSIVE METABOLIC PANEL
ALT: 14 U/L (ref 6–29)
AST: 13 U/L (ref 10–35)
Albumin: 3.7 g/dL (ref 3.6–5.1)
Alkaline Phosphatase: 92 U/L (ref 33–130)
BUN: 14 mg/dL (ref 7–25)
CO2: 25 mmol/L (ref 20–31)
Calcium: 8.9 mg/dL (ref 8.6–10.4)
Chloride: 106 mmol/L (ref 98–110)
Creat: 1.11 mg/dL — ABNORMAL HIGH (ref 0.50–1.05)
Glucose, Bld: 105 mg/dL — ABNORMAL HIGH (ref 70–99)
Potassium: 3.6 mmol/L (ref 3.5–5.3)
Sodium: 143 mmol/L (ref 135–146)
Total Bilirubin: 0.3 mg/dL (ref 0.2–1.2)
Total Protein: 6.4 g/dL (ref 6.1–8.1)

## 2015-05-09 LAB — HEMOGLOBIN A1C
Hgb A1c MFr Bld: 6.3 % — ABNORMAL HIGH (ref <5.7)
Mean Plasma Glucose: 134 mg/dL — ABNORMAL HIGH (ref <117)

## 2015-05-09 MED ORDER — OXYBUTYNIN CHLORIDE ER 15 MG PO TB24: 15.0000 mg | ORAL_TABLET | Freq: Every day | ORAL | Status: DC

## 2015-05-09 NOTE — Progress Notes (Signed)
Patient ID: Gwendolyn Woodward, female   DOB: 10-05-1965, 49 y.o.   MRN: [id number]   Subjective:    Patient ID: Gwendolyn Woodward, female    DOB: [birth year], 49 y.o.   MRN: [id number]  Patient presents for 3 month F/U; Stress Incontinence; and Cough  A she'll get a follow-up medications. She is now getting her meds through her mail order she did not bring anything with her today but they are prepackaged by morning and afternoon. She brought her meter with her today her only 12 readings over the past 30 days the average was 125. Her weight is up 1 pound since her last visit. She is not eating very healthy this morning she states that she had a fish sandwich and french fries for breakfast. She still stays home most of the day. When asked about her depression medication she states she was not taking them but then states she is not sure was in the package of meds.   she has had a cough for the past 2 months she states when she coughs she feels like there is a little acid coming up and it makes her vomit a little bit. She denies feeling sick has not had any fever. She does not have any acid reflux medication. The coughing also makes her stress incontinence worse. It is not clear she is still taking the Ditropan    Review Of Systems:  GEN- denies fatigue, fever, weight loss,weakness, recent illness HEENT- denies eye drainage, change in vision, nasal discharge, CVS- denies chest pain, palpitations RESP- denies SOB, +cough, wheeze ABD- denies N/V, change in stools, abd pain GU- denies dysuria, hematuria, dribbling,+ incontinence MSK- denies joint pain, muscle aches, injury Neuro- denies headache, dizziness, syncope, seizure activity       Objective:    BP 132/68 mmHg  Pulse 78  Temp(Src) 97.9 F (36.6 C) (Oral)  Resp 14  Ht 5\' 2"  (1.575 m)  Wt 253 lb (114.76 kg)  BMI 46.26 kg/m2 GEN- NAD, alert and oriented x3,obese  HEENT- PERRL, EOMI, non injected sclera, pink conjunctiva, MMM, oropharynx  clear Neck- Supple,  CVS- RRR, no murmur RESP-CTAB ABD-NABS,soft,NT,ND Psych- normal affect and mood EXT- No edema Pulses- Radia  2+        Assessment & Plan:      Problem List Items Addressed This Visit    Obesity    Discussed healthy eating, given handouts with pictures of foods and list of foods Fruits and veggies Instead of golden Carrell and fastfood      Hypertension    Well controlled, no change to meds today      Gastroesophageal reflux disease    I think her cough may be related to acid reflux, given dexialnt samples to try, if not will stop lisinopril      Diabetes mellitus - Primary    Recheck A1C on MTF, statin, ACEI Declines flu shot      Relevant Orders   Comprehensive metabolic panel   Hemoglobin A1c   Depression    Hopefully she is taking her prozac, will obtain medication list with regards to refills         Note: This dictation was prepared with Dragon dictation along with smaller phrase technology. Any transcriptional errors that result from this process are unintentional.

## 2015-05-09 NOTE — Addendum Note (Signed)
Addended by: Vic Blackbird F on: 05/09/2015 05:10 PM   Modules accepted: Orders, Medications

## 2015-05-09 NOTE — Patient Instructions (Addendum)
Take all the medications above Take the dexilant  F/U 4 months

## 2015-05-09 NOTE — Assessment & Plan Note (Signed)
I think her cough may be related to acid reflux, given dexialnt samples to try, if not will stop lisinopril

## 2015-05-09 NOTE — Assessment & Plan Note (Signed)
Discussed healthy eating, given handouts with pictures of foods and list of foods Fruits and veggies Instead of golden [employer] and fastfood

## 2015-05-09 NOTE — Assessment & Plan Note (Signed)
Recheck A1C on MTF, statin, ACEI Declines flu shot

## 2015-05-09 NOTE — Assessment & Plan Note (Signed)
Well controlled, no change to meds today

## 2015-05-09 NOTE — Assessment & Plan Note (Signed)
Hopefully she is taking her prozac, will obtain medication list with regards to refills

## 2015-05-12 ENCOUNTER — Other Ambulatory Visit: Payer: Self-pay | Admitting: *Deleted

## 2015-05-12 MED ORDER — FLUOXETINE HCL 40 MG PO CAPS: 40.0000 mg | ORAL_CAPSULE | Freq: Every day | ORAL | Status: DC

## 2015-05-15 ENCOUNTER — Encounter: Payer: Self-pay | Admitting: *Deleted

## 2015-05-23 ENCOUNTER — Telehealth: Payer: Self-pay | Admitting: *Deleted

## 2015-05-23 NOTE — Telephone Encounter (Signed)
Call placed to patient. LMTRC.  

## 2015-05-23 NOTE — Telephone Encounter (Signed)
-----   Message from Alycia Rossetti, MD sent at 05/23/2015 10:42 AM EDT ----- Regarding: FW: F/U Dexilant See if dexilant helped symptoms,send in script  ----- Message -----    From: Alycia Rossetti, MD    Sent: 05/20/2015      To: Alycia Rossetti, MD Subject: F/U Dexilant

## 2015-05-26 NOTE — Telephone Encounter (Signed)
Call placed to patient.   Reports that she only takes Dexilant as needed, but she has not needed medication recently.   States that she still has samples. Reports that she will call if prescription is required.

## 2015-05-30 ENCOUNTER — Other Ambulatory Visit: Payer: Medicare Other

## 2015-05-30 ENCOUNTER — Ambulatory Visit: Payer: Self-pay | Admitting: Family Medicine

## 2015-06-02 ENCOUNTER — Ambulatory Visit (INDEPENDENT_AMBULATORY_CARE_PROVIDER_SITE_OTHER): Payer: Medicare Other | Admitting: Family Medicine

## 2015-06-02 ENCOUNTER — Encounter: Payer: Self-pay | Admitting: Family Medicine

## 2015-06-02 VITALS — BP 126/78 | HR 76 | Temp 98.5°F | Resp 16 | Ht 62.0 in | Wt 246.0 lb

## 2015-06-02 DIAGNOSIS — R55 Syncope and collapse: Secondary | ICD-10-CM

## 2015-06-02 NOTE — Patient Instructions (Signed)
Your EKG is normal Your blood pressure is good Keep taking the medications like we discussed Eat before work and at lunch time F/U as previous
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
## 2015-06-03 LAB — CBC WITH DIFFERENTIAL/PLATELET
Basophils Absolute: 0.1 10*3/uL (ref 0.0–0.1)
Basophils Relative: 1 % (ref 0–1)
Eosinophils Absolute: 0.1 10*3/uL (ref 0.0–0.7)
Eosinophils Relative: 2 % (ref 0–5)
HCT: 40 % (ref 36.0–46.0)
Hemoglobin: 13 g/dL (ref 12.0–15.0)
Lymphocytes Relative: 42 % (ref 12–46)
Lymphs Abs: 2.2 10*3/uL (ref 0.7–4.0)
MCH: 25.8 pg — ABNORMAL LOW (ref 26.0–34.0)
MCHC: 32.5 g/dL (ref 30.0–36.0)
MCV: 79.4 fL (ref 78.0–100.0)
MPV: 10.6 fL (ref 8.6–12.4)
Monocytes Absolute: 0.5 10*3/uL (ref 0.1–1.0)
Monocytes Relative: 9 % (ref 3–12)
Neutro Abs: 2.4 10*3/uL (ref 1.7–7.7)
Neutrophils Relative %: 46 % (ref 43–77)
Platelets: 224 10*3/uL (ref 150–400)
RBC: 5.04 MIL/uL (ref 3.87–5.11)
RDW: 15.4 % (ref 11.5–15.5)
WBC: 5.2 10*3/uL (ref 4.0–10.5)

## 2015-06-03 LAB — BASIC METABOLIC PANEL
BUN: 12 mg/dL (ref 7–25)
CO2: 30 mmol/L (ref 20–31)
Calcium: 9.7 mg/dL (ref 8.6–10.4)
Chloride: 99 mmol/L (ref 98–110)
Creat: 1.04 mg/dL (ref 0.50–1.05)
Glucose, Bld: 88 mg/dL (ref 70–99)
Potassium: 3.9 mmol/L (ref 3.5–5.3)
Sodium: 138 mmol/L (ref 135–146)

## 2015-06-16 ENCOUNTER — Other Ambulatory Visit: Payer: Self-pay | Admitting: Family Medicine

## 2015-06-16 NOTE — Telephone Encounter (Signed)
Refill appropriate and filled per protocol. 

## 2015-11-12 ENCOUNTER — Ambulatory Visit (INDEPENDENT_AMBULATORY_CARE_PROVIDER_SITE_OTHER): Payer: Medicare Other | Admitting: Family Medicine

## 2015-11-12 ENCOUNTER — Encounter: Payer: Self-pay | Admitting: Family Medicine

## 2015-11-12 VITALS — BP 128/74 | HR 88 | Temp 98.0°F | Resp 16 | Ht 62.0 in | Wt 245.0 lb

## 2015-11-12 DIAGNOSIS — I10 Essential (primary) hypertension: Secondary | ICD-10-CM

## 2015-11-12 DIAGNOSIS — G5602 Carpal tunnel syndrome, left upper limb: Secondary | ICD-10-CM

## 2015-11-12 DIAGNOSIS — M75101 Unspecified rotator cuff tear or rupture of right shoulder, not specified as traumatic: Secondary | ICD-10-CM

## 2015-11-12 DIAGNOSIS — E119 Type 2 diabetes mellitus without complications: Secondary | ICD-10-CM | POA: Diagnosis not present

## 2015-11-12 DIAGNOSIS — E669 Obesity, unspecified: Secondary | ICD-10-CM | POA: Diagnosis not present

## 2015-11-12 DIAGNOSIS — F39 Unspecified mood [affective] disorder: Secondary | ICD-10-CM | POA: Diagnosis not present

## 2015-11-12 LAB — LIPID PANEL
Cholesterol: 156 mg/dL (ref 125–200)
HDL: 56 mg/dL (ref >=46)
LDL Cholesterol: 82 mg/dL (ref <130)
Total CHOL/HDL Ratio: 2.8 Ratio (ref <=5.0)
Triglycerides: 89 mg/dL (ref <150)
VLDL: 18 mg/dL (ref <30)

## 2015-11-12 LAB — CBC WITH DIFFERENTIAL/PLATELET
Basophils Absolute: 0 10*3/uL (ref 0.0–0.1)
Basophils Relative: 0 % (ref 0–1)
Eosinophils Absolute: 0.2 10*3/uL (ref 0.0–0.7)
Eosinophils Relative: 4 % (ref 0–5)
HCT: 38.7 % (ref 36.0–46.0)
Hemoglobin: 12.4 g/dL (ref 12.0–15.0)
Lymphocytes Relative: 34 % (ref 12–46)
Lymphs Abs: 2 10*3/uL (ref 0.7–4.0)
MCH: 26.2 pg (ref 26.0–34.0)
MCHC: 32 g/dL (ref 30.0–36.0)
MCV: 81.6 fL (ref 78.0–100.0)
MPV: 10.7 fL (ref 8.6–12.4)
Monocytes Absolute: 0.4 10*3/uL (ref 0.1–1.0)
Monocytes Relative: 7 % (ref 3–12)
Neutro Abs: 3.2 10*3/uL (ref 1.7–7.7)
Neutrophils Relative %: 55 % (ref 43–77)
Platelets: 178 10*3/uL (ref 150–400)
RBC: 4.74 MIL/uL (ref 3.87–5.11)
RDW: 15.1 % (ref 11.5–15.5)
WBC: 5.9 10*3/uL (ref 4.0–10.5)

## 2015-11-12 LAB — COMPREHENSIVE METABOLIC PANEL
ALT: 13 U/L (ref 6–29)
AST: 11 U/L (ref 10–35)
Albumin: 3.6 g/dL (ref 3.6–5.1)
Alkaline Phosphatase: 77 U/L (ref 33–130)
BUN: 17 mg/dL (ref 7–25)
CO2: 25 mmol/L (ref 20–31)
Calcium: 9 mg/dL (ref 8.6–10.4)
Chloride: 105 mmol/L (ref 98–110)
Creat: 0.95 mg/dL (ref 0.50–1.05)
Glucose, Bld: 88 mg/dL (ref 70–99)
Potassium: 4.3 mmol/L (ref 3.5–5.3)
Sodium: 141 mmol/L (ref 135–146)
Total Bilirubin: 0.3 mg/dL (ref 0.2–1.2)
Total Protein: 6.4 g/dL (ref 6.1–8.1)

## 2015-11-12 MED ORDER — GABAPENTIN 300 MG PO CAPS: 300.0000 mg | ORAL_CAPSULE | Freq: Every day | ORAL | Status: DC

## 2015-11-12 MED ORDER — METFORMIN HCL ER 500 MG PO TB24: ORAL_TABLET | ORAL | Status: DC

## 2015-11-12 MED ORDER — FLUOXETINE HCL 40 MG PO CAPS: ORAL_CAPSULE | ORAL | Status: DC

## 2015-11-12 MED ORDER — LISINOPRIL-HYDROCHLOROTHIAZIDE 20-25 MG PO TABS: 1.0000 | ORAL_TABLET | Freq: Every day | ORAL | Status: DC

## 2015-11-12 MED ORDER — PRAVASTATIN SODIUM 20 MG PO TABS: 20.0000 mg | ORAL_TABLET | Freq: Every day | ORAL | Status: DC

## 2015-11-12 MED ORDER — MELOXICAM 7.5 MG PO TABS: 7.5000 mg | ORAL_TABLET | Freq: Every day | ORAL | Status: DC | PRN

## 2015-11-12 NOTE — Progress Notes (Signed)
Patient ID: Gwendolyn Woodward, female   DOB: Apr 21, 1966, 50 y.o.   MRN: [id number]    Subjective:    Patient ID: Gwendolyn Woodward, female    DOB: 11/27/1965, 50 y.o.   MRN: [id number]  Patient presents for R Arm Pain and B Hand Numbness Patient for follow-up on her chronic medical polyps. She has diabetes mellitus she is on metformin extended release once a day. Her last A1c 6 months ago was 6.3%  She continues to have issues with her right shoulder she has rotator cuff syndrome which was dilated by orthopedics in 2015 she still has a weakness and now with her repetitive job she is having problems on a regular basis. She will like to return to orthopedics for reevaluation. They did give steroid injection and she completed physical therapy and she did improve initially. She's been taking over-the-counter Goody powders / back and body pills and also using a TENS unit. She also stated the past few months that she has had tingling in her left hand on and off and also goes numb. She has to shake it out when she is at work and try to rub her fingers and in the feeling seems to come back in it.  On review of her medication she is only taking her metformin and her blood pressure medication she has to stop the rest of them at random times. She knows that she needs to be on her Prozac. She's been having some issues with another employee at work.  Review Of Systems:  GEN- denies fatigue, fever, weight loss,weakness, recent illness HEENT- denies eye drainage, change in vision, nasal discharge, CVS- denies chest pain, palpitations RESP- denies SOB, cough, wheeze ABD- denies N/V, change in stools, abd pain GU- denies dysuria, hematuria, dribbling, incontinence MSK- +joint pain, muscle aches, injury Neuro- denies headache, dizziness, syncope, seizure activity       Objective:    BP 128/74 mmHg  Pulse 88  Temp(Src) 98 F (36.7 C) (Oral)  Resp 16  Ht 5\' 2"  (1.575 m)  Wt 245 lb (111.131 kg)  BMI 44.80  kg/m2 GEN- NAD, alert and oriented x3 HEENT- PERRL, EOMI, non injected sclera, pink conjunctiva, MMM, oropharynx clear Neck- Supple, good ROM, neg [place]  CVS- RRR, no murmur RESP-CTAB MSK- Fair ROM Right shoulder, compared to left, +empty can, biceps in tact, neg neers sign  Strength 4+/5 compared to left Neuro- CNII-XII intact, sensation in tact, +tinels left side, normal tone UE, Psych- normal affect and mood EXT- No edema Pulses- Radial - 2+        Assessment & Plan:      Problem List Items Addressed This Visit    Rotator cuff syndrome   Relevant Orders   Ambulatory referral to Orthopedic Surgery   Obesity   Relevant Medications   metFORMIN (GLUCOPHAGE-XR) 500 MG 24 hr tablet   Mood disorder (HCC)    Her mood is normal today however she's been having some anger issues which as been her problem in the past. She is to restart her Prozac she understands that she needs to take it on a regular basis      Hypertension - Primary    Blood pressure looks okay today no change in medication      Relevant Medications   pravastatin (PRAVACHOL) 20 MG tablet   lisinopril-hydrochlorothiazide (PRINZIDE,ZESTORETIC) 20-25 MG tablet   Other Relevant Orders   CBC with Differential/Platelet   Comprehensive metabolic panel   Diabetes mellitus ([place])  Recheck A1 C continue metformin discussed importance of taking her medication a regular basis as an issue for her in the past Restart statin drugs      Relevant Medications   pravastatin (PRAVACHOL) 20 MG tablet   metFORMIN (GLUCOPHAGE-XR) 500 MG 24 hr tablet   lisinopril-hydrochlorothiazide (PRINZIDE,ZESTORETIC) 20-25 MG tablet   Other Relevant Orders   Hemoglobin A1c   Lipid panel    Other Visit Diagnoses    Carpal tunnel syndrome of left wrist        Same side as previous CVA, but symptoms concerning for carpal tunnel, repetitive work, wrist brace and gabapentin given     Relevant Medications    FLUoxetine (PROZAC) 40 MG  capsule    gabapentin (NEURONTIN) 300 MG capsule       Note: This dictation was prepared with Dragon dictation along with smaller phrase technology. Any transcriptional errors that result from this process are unintentional.

## 2015-11-12 NOTE — Assessment & Plan Note (Signed)
Blood pressure looks okay today no change in medication 

## 2015-11-12 NOTE — Assessment & Plan Note (Signed)
Given meloxicam I'll refer her back to orthopedics

## 2015-11-12 NOTE — Patient Instructions (Addendum)
Try gabapentin at bedtime  Take the meloxicam for your shoulder Referral to Orthopedics  Restart your other medications Get the brace for your hand  F/U 2 months

## 2015-11-12 NOTE — Assessment & Plan Note (Signed)
Recheck A1 C continue metformin discussed importance of taking her medication a regular basis as an issue for her in the past Restart statin drugs

## 2015-11-12 NOTE — Assessment & Plan Note (Signed)
Her mood is normal today however she's been having some anger issues which as been her problem in the past. She is to restart her Prozac she understands that she needs to take it on a regular basis

## 2015-11-13 LAB — HEMOGLOBIN A1C
Hgb A1c MFr Bld: 6 % — ABNORMAL HIGH (ref <5.7)
Mean Plasma Glucose: 126 mg/dL — ABNORMAL HIGH (ref <117)

## 2015-11-25 ENCOUNTER — Encounter: Payer: Self-pay | Admitting: Family Medicine

## 2015-11-25 ENCOUNTER — Ambulatory Visit (INDEPENDENT_AMBULATORY_CARE_PROVIDER_SITE_OTHER): Payer: Medicare Other | Admitting: Family Medicine

## 2015-11-25 VITALS — BP 138/74 | HR 82 | Temp 97.9°F | Resp 16 | Ht 62.0 in | Wt 238.0 lb

## 2015-11-25 DIAGNOSIS — M545 Low back pain, unspecified: Secondary | ICD-10-CM

## 2015-11-25 DIAGNOSIS — G8929 Other chronic pain: Secondary | ICD-10-CM

## 2015-11-25 MED ORDER — METHOCARBAMOL 500 MG PO TABS: 500.0000 mg | ORAL_TABLET | Freq: Two times a day (BID) | ORAL | Status: DC | PRN

## 2015-11-25 NOTE — Progress Notes (Signed)
Patient ID: Gwendolyn Woodward, female   DOB: 1965-09-13, 50 y.o.   MRN: [id number]      Subjective:    Patient ID: Gwendolyn Woodward, female    DOB: Aug 29, 1965, 50 y.o.   MRN: [id number]  Patient presents for Back Pain   Patient here with low back pain. She had an episode of back spasm yesterday she states that it made her go to her knees. She did not have any particular injury but has had chronic back pain for the past 2 years. Mostly shows up as spasms in her lower back. She denies any radiation down the extremities denies any tea and numbness no change in bowel or bladder. Note she was seen about a week and a half ago for her regular medication she still has not received and we called her pharmacy they state that she had refused to pay but she denies this. She does not have her anti-inflammatories or diabetes meds etc. Also the orthopedic called her and she'll not answer the phone with regards to her chronic shoulder pain    Review Of Systems:  GEN- denies fatigue, fever, weight loss,weakness, recent illness HEENT- denies eye drainage, change in vision, nasal discharge, CVS- denies chest pain, palpitations RESP- denies SOB, cough, wheeze ABD- denies N/V, change in stools, abd pain GU- denies dysuria, hematuria, dribbling, incontinence MSK-+ joint pain, muscle aches, injury Neuro- denies headache, dizziness, syncope, seizure activity       Objective:    BP 138/74 mmHg  Pulse 82  Temp(Src) 97.9 F (36.6 C) (Oral)  Resp 16  Ht 5\' 2"  (1.575 m)  Wt 238 lb (107.956 kg)  BMI 43.52 kg/m2 GEN- NAD, alert and oriented x3 MSK- Spine NT, TTP Bilat parapsinals, fair ROM spine, neg SLR, Fair ROM HIPS KNEES Neuro- no new deficits, non antalgic gait  EXT- No edema Pulses- Radial - 2+        Assessment & Plan:      Problem List Items Addressed This Visit    None    Visit Diagnoses    Chronic low back pain    -  Primary    No red flags, start Mobic already sent to pharmacy, given  robaxin, work on exercises, stretching    Relevant Medications    methocarbamol (ROBAXIN) 500 MG tablet       Note: This dictation was prepared with Dragon dictation along with smaller phrase technology. Any transcriptional errors that result from this process are unintentional.

## 2015-11-25 NOTE — Patient Instructions (Signed)
Take Mobic for your back/shoulder Take robaxin for muscle spasm F/U as previous

## 2015-11-27 ENCOUNTER — Telehealth: Payer: Self-pay | Admitting: *Deleted

## 2015-11-27 NOTE — Telephone Encounter (Signed)
Received call from [facility], pharmacist with [facility].   Reports that Robaxin is not covered by insurance.   Reports that Zanaflex is covered.  MD please advise.

## 2015-11-28 MED ORDER — TIZANIDINE HCL 4 MG PO TABS: 4.0000 mg | ORAL_TABLET | Freq: Three times a day (TID) | ORAL | Status: DC | PRN

## 2015-11-28 NOTE — Telephone Encounter (Signed)
Change to zanaflex 4mg  every 8 hours as needed #30 R 1

## 2015-11-28 NOTE — Telephone Encounter (Signed)
rx called to pharmacy 

## 2015-12-01 ENCOUNTER — Telehealth: Payer: Self-pay | Admitting: Family Medicine

## 2015-12-01 NOTE — Telephone Encounter (Signed)
Patient dropped off [place] I have routed to [place].

## 2015-12-02 NOTE — Telephone Encounter (Signed)
Called and spoke to pt daughter and she stated reason for P[employer] form was for when nurse comes out to home and help with ADL and house work, states the nurse needs form for [employer] she works for to show is eligible for services.   I have routed to Dr. Buelah Manis  Daughter is aware of form fee of 20 dollars

## 2016-01-13 ENCOUNTER — Ambulatory Visit: Payer: Medicare Other | Admitting: Family Medicine

## 2016-01-14 ENCOUNTER — Encounter: Payer: Self-pay | Admitting: Family Medicine

## 2016-01-22 ENCOUNTER — Ambulatory Visit (INDEPENDENT_AMBULATORY_CARE_PROVIDER_SITE_OTHER): Payer: Medicare Other | Admitting: Physician Assistant

## 2016-01-22 ENCOUNTER — Encounter: Payer: Self-pay | Admitting: Physician Assistant

## 2016-01-22 VITALS — BP 140/80 | HR 76 | Temp 98.4°F | Resp 18 | Wt 238.0 lb

## 2016-01-22 DIAGNOSIS — M545 Low back pain, unspecified: Secondary | ICD-10-CM

## 2016-01-22 MED ORDER — TIZANIDINE HCL 4 MG PO TABS: 4.0000 mg | ORAL_TABLET | Freq: Three times a day (TID) | ORAL | Status: DC | PRN

## 2016-01-22 NOTE — Progress Notes (Signed)
Patient ID: Gwendolyn Woodward MRN: [id number], DOB: [birth year], 50 y.o. Date of Encounter: 01/22/2016, 2:48 PM    Chief Complaint:  Chief Complaint  Patient presents with  . spasms in back    never got muscle relaxer from last visit     HPI: 50 y.o. year old AA female presents with above.   I reviewed her OV note with Dr. Buelah Manis 11/25/2015.  Reviewed nurse note above with pt.   Pt says that the pharmacy told her that "the medicine wasn't covered by medicare/medicaid".  She says "nurse just told me they called in different medicine that was covered" Pt says she never heard anything else---never knew that a different medicine was sent to pharmacy.   Says that last night she was sitting in a chair and developed spasm in left low back. It finally eased. Later, had 2 more milder spasms in left low back.  No pain, numbness, tingling in either leg. No weakness, tingling in either leg or foot. No incontinence of bowel or bladder.      Home Meds:   Outpatient Prescriptions Prior to Visit  Medication Sig Dispense Refill  . Blood Glucose Monitoring Suppl (BLOOD GLUCOSE METER KIT AND SUPPLIES) Dispense based on patient and insurance preference. Use to monitor FSBS once daily as directed. Dx: 250.00 1 each 0  . clotrimazole (LOTRIMIN) 1 % cream Apply 1 application topically 2 (two) times daily. To feet 30 g 2  . FLUoxetine (PROZAC) 40 MG capsule TAKE 1 CAPSULE BY MOUTH EVERY DAY. 90 capsule 1  . gabapentin (NEURONTIN) 300 MG capsule Take 1 capsule (300 mg total) by mouth at bedtime. For nerve pain 30 capsule 3  . glucose blood (EASYMAX TEST) test strip USE TO TEST BLOOD SUGAR ONCE DAILY AS DIRECTED. 50 each 11  . lisinopril-hydrochlorothiazide (PRINZIDE,ZESTORETIC) 20-25 MG tablet Take 1 tablet by mouth daily. 90 tablet 1  . meloxicam (MOBIC) 7.5 MG tablet Take 1 tablet (7.5 mg total) by mouth daily as needed for pain. For joint pain- please deliver 30 tablet 3  . metFORMIN (GLUCOPHAGE-XR)  500 MG 24 hr tablet TAKE ONE TABLET BY MOUTH DAILY WITH BREAKFAST FOR DIABETES. 90 tablet 1  . nitroGLYCERIN (NITROSTAT) 0.4 MG SL tablet Place 1 tablet (0.4 mg total) under the tongue every 5 (five) minutes as needed for chest pain. 25 tablet 1  . pravastatin (PRAVACHOL) 20 MG tablet Take 1 tablet (20 mg total) by mouth at bedtime. 90 tablet 1  . tiZANidine (ZANAFLEX) 4 MG tablet Take 1 tablet (4 mg total) by mouth every 8 (eight) hours as needed for muscle spasms. (Patient not taking: Reported on 01/22/2016) 30 tablet 1   No facility-administered medications prior to visit.    Allergies:  Allergies  Allergen Reactions  . Aspirin     INTERNAL BLEEDING  . Tramadol Itching      Review of Systems: See HPI for pertinent ROS. All other ROS negative.    Physical Exam: Blood pressure 140/80, pulse 76, temperature 98.4 F (36.9 C), temperature source Oral, resp. rate 18, weight 238 lb (107.956 kg)., Body mass index is 43.52 kg/(m^2). General:  Obese AAF. Appears in no acute distress. Lungs: Clear bilaterally to auscultation without wheezes, rales, or rhonchi. Breathing is unlabored. Heart: Regular rhythm. No murmurs, rubs, or gallops. Msk:  Strength and tone normal for age. Left low back is area she points to where spasms occur. Exam normal at present time.  Extremities/Skin: Warm and dry. Neuro: Alert and  oriented X 3. Moves all extremities spontaneously. Gait is normal. CNII-XII grossly in tact. Psych:  Responds to questions appropriately with a normal affect.     ASSESSMENT AND PLAN:  50 y.o. year old female with  1. Left-sided low back pain without sciatica Reviwwed Chart. 11/2015-- Zanaflex was covered by her Insurance and the following is what Dr. Buelah Manis prescribed.  Will prescribe this to use as needed for back spasms. Also needs weight loss, routine stretching.   - tiZANidine (ZANAFLEX) 4 MG tablet; Take 1 tablet (4 mg total) by mouth every 8 (eight) hours as needed for muscle  spasms.  Dispense: 30 tablet; Refill: 1   Signed, [address] [place], [place], [facility] 01/22/2016 2:48 PM

## 2016-01-28 DIAGNOSIS — H2513 Age-related nuclear cataract, bilateral: Secondary | ICD-10-CM | POA: Diagnosis not present

## 2016-01-28 LAB — HM DIABETES EYE EXAM

## 2016-01-29 ENCOUNTER — Encounter: Payer: Self-pay | Admitting: *Deleted

## 2016-02-23 ENCOUNTER — Ambulatory Visit (INDEPENDENT_AMBULATORY_CARE_PROVIDER_SITE_OTHER): Payer: Medicare Other | Admitting: Family Medicine

## 2016-02-23 ENCOUNTER — Encounter: Payer: Self-pay | Admitting: Family Medicine

## 2016-02-23 VITALS — BP 136/84 | HR 82 | Temp 98.6°F | Resp 14 | Ht 62.0 in | Wt 245.0 lb

## 2016-02-23 DIAGNOSIS — F329 Major depressive disorder, single episode, unspecified: Secondary | ICD-10-CM

## 2016-02-23 DIAGNOSIS — Z8673 Personal history of transient ischemic attack (TIA), and cerebral infarction without residual deficits: Secondary | ICD-10-CM | POA: Diagnosis not present

## 2016-02-23 DIAGNOSIS — M75101 Unspecified rotator cuff tear or rupture of right shoulder, not specified as traumatic: Secondary | ICD-10-CM

## 2016-02-23 DIAGNOSIS — I10 Essential (primary) hypertension: Secondary | ICD-10-CM | POA: Diagnosis not present

## 2016-02-23 DIAGNOSIS — E119 Type 2 diabetes mellitus without complications: Secondary | ICD-10-CM

## 2016-02-23 DIAGNOSIS — F32A Depression, unspecified: Secondary | ICD-10-CM

## 2016-02-23 LAB — COMPREHENSIVE METABOLIC PANEL
ALT: 10 U/L (ref 6–29)
AST: 12 U/L (ref 10–35)
Albumin: 3.7 g/dL (ref 3.6–5.1)
Alkaline Phosphatase: 79 U/L (ref 33–130)
BUN: 17 mg/dL (ref 7–25)
CO2: 25 mmol/L (ref 20–31)
Calcium: 9.3 mg/dL (ref 8.6–10.4)
Chloride: 105 mmol/L (ref 98–110)
Creat: 1.01 mg/dL (ref 0.50–1.05)
Glucose, Bld: 104 mg/dL — ABNORMAL HIGH (ref 70–99)
Potassium: 4.5 mmol/L (ref 3.5–5.3)
Sodium: 140 mmol/L (ref 135–146)
Total Bilirubin: 0.4 mg/dL (ref 0.2–1.2)
Total Protein: 6.5 g/dL (ref 6.1–8.1)

## 2016-02-23 LAB — CBC WITH DIFFERENTIAL/PLATELET
Basophils Absolute: 0 cells/uL (ref 0–200)
Basophils Relative: 0 %
Eosinophils Absolute: 153 cells/uL (ref 15–500)
Eosinophils Relative: 3 %
HCT: 40 % (ref 35.0–45.0)
Hemoglobin: 12.5 g/dL (ref 12.0–15.0)
Lymphocytes Relative: 40 %
Lymphs Abs: 2040 cells/uL (ref 850–3900)
MCH: 25.3 pg — ABNORMAL LOW (ref 27.0–33.0)
MCHC: 31.3 g/dL — ABNORMAL LOW (ref 32.0–36.0)
MCV: 81 fL (ref 80.0–100.0)
MPV: 11.1 fL (ref 7.5–12.5)
Monocytes Absolute: 408 cells/uL (ref 200–950)
Monocytes Relative: 8 %
Neutro Abs: 2499 cells/uL (ref 1500–7800)
Neutrophils Relative %: 49 %
Platelets: 200 10*3/uL (ref 140–400)
RBC: 4.94 MIL/uL (ref 3.80–5.10)
RDW: 15.5 % — ABNORMAL HIGH (ref 11.0–15.0)
WBC: 5.1 10*3/uL (ref 3.8–10.8)

## 2016-02-23 LAB — HEMOGLOBIN A1C
Hgb A1c MFr Bld: 6.2 % — ABNORMAL HIGH (ref <5.7)
Mean Plasma Glucose: 131 mg/dL

## 2016-02-23 MED ORDER — METFORMIN HCL ER 500 MG PO TB24: ORAL_TABLET | ORAL | Status: DC

## 2016-02-23 MED ORDER — MELOXICAM 7.5 MG PO TABS: 7.5000 mg | ORAL_TABLET | Freq: Every day | ORAL | Status: DC | PRN

## 2016-02-23 MED ORDER — LANCET DEVICES MISC: Status: DC

## 2016-02-23 MED ORDER — LANCETS MISC: Status: DC

## 2016-02-23 MED ORDER — GLUCOSE BLOOD VI STRP: ORAL_STRIP | Status: DC

## 2016-02-23 MED ORDER — PRAVASTATIN SODIUM 20 MG PO TABS: 20.0000 mg | ORAL_TABLET | Freq: Every day | ORAL | Status: DC

## 2016-02-23 MED ORDER — GABAPENTIN 300 MG PO CAPS: 300.0000 mg | ORAL_CAPSULE | Freq: Every day | ORAL | Status: DC

## 2016-02-23 MED ORDER — FLUOXETINE HCL 40 MG PO CAPS: ORAL_CAPSULE | ORAL | Status: DC

## 2016-02-23 NOTE — Assessment & Plan Note (Signed)
Continue MTF has had good control of DM A1C today  On statin , allergy to ASA

## 2016-02-23 NOTE — Patient Instructions (Addendum)
Take your medications Restart the gabapentin at bedtime  i will call with sugar results Call orthopedics Dr. Aline Brochure [phone] F/U 4 months

## 2016-02-23 NOTE — Assessment & Plan Note (Signed)
Restart gabapentin will try to use this for her chronic pain and her migraines to minimize medications No red flags, very intermittant HA

## 2016-02-23 NOTE — Progress Notes (Signed)
Patient ID: Gwendolyn Woodward, female   DOB: [birth year], 50 y.o.   MRN: [id number]    Subjective:    Patient ID: Gwendolyn Woodward, female    DOB: July 03, 1966, 50 y.o.   MRN: [id number]  Patient presents for R Arm Pain and HA/ Vertigo Patient here for follow-up. She continues to have pain in her right shoulder which she's had rotator cuff syndrome she's had steroid injections. Back in March and refer her back to orthopedics but she never returned the phone call so the orthopedic office therefore was not seen.  She has history of stroke she also has diabetes mellitus or last A1c was 6% LDL was at goal at 82. Stressed importance of taking all her medications on a regular basis as often she will skip them.  She has history of migraines noted back to 2013 and minute. We had her using Imitrex as needed. I did not start Topamax as her psychiatrist at that time was treated her mood disorder. She had been seen by neurology in the past secondary to history of stroke. She was released last seen by her eye doctor for her intermittent migraines she was given a new prescription for eyeglasses which she needs to pick up  She's actually stop most of her medication she intermittently takes her blood pressure medicine and her Glucophage. She states that she stopped her Prozac because she is only sit in the house and not interact with anybody before she is not getting angry and upset but she is still depressed. She occasionally leaves the house to go to the [place]:  GEN- denies fatigue, fever, weight loss,weakness, recent illness HEENT- denies eye drainage, change in vision, nasal discharge, CVS- denies chest pain, palpitations RESP- denies SOB, cough, wheeze ABD- denies N/V, change in stools, abd pain GU- denies dysuria, hematuria, dribbling, incontinence MSK- + joint pain, muscle aches, injury Neuro- + headache, dizziness, syncope, seizure activity       Objective:    BP 136/84 mmHg  Pulse  82  Temp(Src) 98.6 F (37 C) (Oral)  Resp 14  Ht 5\' 2"  (1.575 m)  Wt 245 lb (111.131 kg)  BMI 44.80 kg/m2 GEN- NAD, alert and oriented x3 HEENT- PERRL, EOMI, non injected sclera, pink conjunctiva, MMM, oropharynx clear Neck- Supple, good ROM,  CVS- RRR, no murmur RESP-CTAB MSK- decreased ROM Right shoulder, , +empty can, biceps in tact, neg neers sign  Strength 4+/5 compared to left Psych- normal affect and mood EXT- No edema Pulses- Radial - 2+     Assessment & Plan:      Problem List Items Addressed This Visit    Rotator cuff syndrome    Recurrent issue with her right upper extremity she's now losing some function. She has anti-inflammatory I will have her call orthopedics directly we've been having issues with her answering the phone for her appointments      Hypertension    The pressure looks okay though she is taking her medicine intermittently discussed importance of regular medication also advised her to use a pill caddy to help her remember      Relevant Medications   pravastatin (PRAVACHOL) 20 MG tablet   History of CVA (cerebrovascular accident)    Discussed importance of good control of her risk factors to prevent recurrent stroke and heart disease      Diabetes mellitus ([place])    Continue MTF has had good control of DM A1C today  On statin , allergy to  ASA      Relevant Medications   pravastatin (PRAVACHOL) 20 MG tablet   metFORMIN (GLUCOPHAGE-XR) 500 MG 24 hr tablet   Other Relevant Orders   Comprehensive metabolic panel   Hemoglobin A1c   CBC with Differential/Platelet   Depression - Primary    Restart prozac      Relevant Medications   FLUoxetine (PROZAC) 40 MG capsule      Note: This dictation was prepared with Dragon dictation along with smaller phrase technology. Any transcriptional errors that result from this process are unintentional.

## 2016-02-23 NOTE — Assessment & Plan Note (Signed)
Recurrent issue with her right upper extremity she's now losing some function. She has anti-inflammatory I will have her call orthopedics directly we've been having issues with her answering the phone for her appointments

## 2016-02-23 NOTE — Assessment & Plan Note (Signed)
Discussed importance of good control of her risk factors to prevent recurrent stroke and heart disease

## 2016-02-23 NOTE — Assessment & Plan Note (Signed)
- 

## 2016-02-23 NOTE — Assessment & Plan Note (Signed)
The pressure looks okay though she is taking her medicine intermittently discussed importance of regular medication also advised her to use a pill caddy to help her remember

## 2016-03-01 ENCOUNTER — Encounter: Payer: Self-pay | Admitting: *Deleted

## 2016-03-15 ENCOUNTER — Ambulatory Visit (HOSPITAL_COMMUNITY)
Admission: RE | Admit: 2016-03-15 | Discharge: 2016-03-15 | Disposition: A | Discharge status: Home / Self Care | Payer: Medicare Other | Source: Ambulatory Visit | Attending: Orthopedic Surgery | Admitting: Orthopedic Surgery

## 2016-03-15 ENCOUNTER — Ambulatory Visit (INDEPENDENT_AMBULATORY_CARE_PROVIDER_SITE_OTHER): Payer: Medicare Other | Admitting: Orthopedic Surgery

## 2016-03-15 ENCOUNTER — Encounter: Payer: Self-pay | Admitting: Orthopedic Surgery

## 2016-03-15 VITALS — BP 163/100 | Ht 61.0 in | Wt 242.0 lb

## 2016-03-15 DIAGNOSIS — M19011 Primary osteoarthritis, right shoulder: Secondary | ICD-10-CM | POA: Diagnosis not present

## 2016-03-15 DIAGNOSIS — M25511 Pain in right shoulder: Secondary | ICD-10-CM

## 2016-03-15 DIAGNOSIS — M75101 Unspecified rotator cuff tear or rupture of right shoulder, not specified as traumatic: Secondary | ICD-10-CM | POA: Diagnosis not present

## 2016-03-15 NOTE — Progress Notes (Signed)
Patient ID: Gwendolyn Woodward, female   DOB: Apr 18, 1966, 50 y.o.   MRN: [id number]  Chief Complaint  Patient presents with  . New Patient (Initial Visit)    right shoulder pain    HPI Gwendolyn Woodward is a 50 y.o. female.  Presents for evaluation one year history of right shoulder pain no trauma  HPI Patient is a 10 out of 10 pain over the right shoulder radiating to the right upper arm but sharp throbbing pain which is worse with activity and worse when lying on the right side. She's had BC powders and meloxicam. She had therapy a year ago presents for evaluation and treatment   Review of Systems Review of Systems Review of systems shortness of breath abdominal pain back pain with numbness and tingling chest pain ankle leg edema occasional wheezing   Past Medical History:  Diagnosis Date  . Arthritis    back pain and knee pain, no meds  . Asthma   . Chest pain    + dyspnea  . CVA (cerebral infarction) 1984 , 1988   x2, left sided weakness, history of dizziness  . Depression with anxiety    History- no meds  . Dysphagia   . Fasting hyperglycemia   . Gastroesophageal reflux disease    no meds  . Headache(784.0)    OTC med PRN  . History of anemia    S/p transfusions in 1999 at [facility] after vaginal bleeding; normal CBC and 09/2011  . Hyperlipidemia    lipid profile in 11/2011: 203, 106, 58, 124  . Hypertension    Lab 09/2011: Normal CMet except glucose of 109-169 and albumin-3.4; normal BP 09/2011-12/2011  . Sleep apnea    Does not use CPAP - no longer has CPAP machine  . Stroke [facility])     Past Surgical History:  Procedure Laterality Date  . [place]   x 2  . COLONOSCOPY  01/27/2012   Procedure: COLONOSCOPY;  Surgeon: Danie Binder, MD;  Location: [facility];  Service: Endoscopy;  Laterality: N/A;  8:30  . ENDOMETRIAL ABLATION  Feb 2013  . ESOPHAGOGASTRODUODENOSCOPY  5/11   [facility]-Dr Iva Lento GERD distal esophagus, NEGATIVE bx for  [place]  . IUD REMOVAL  10/20/2011   Procedure: INTRAUTERINE DEVICE (IUD) REMOVAL;  Surgeon: Allena Katz, MD;  Location: [facility];  Service: Gynecology;  Laterality: N/A;  . TUBAL LIGATION    . WISDOM TOOTH EXTRACTION        Family History  Problem Relation Age of Onset  . Anesthesia problems Neg Hx   . Hypertension Mother   . Asthma Mother   . Cancer Father     Lung/Throat  . Heart disease Father   . Diabetes Maternal Uncle    The patient was quizzed about their family history and reported no history of bleeding problems or anesthesia problems in their family  Social History Social History  Substance Use Topics  . Smoking status: Never Smoker  . Smokeless tobacco: Never Used  . Alcohol use Yes     Comment: Socially, 2 times per yr    Allergies  Allergen Reactions  . Aspirin     INTERNAL BLEEDING  . Tramadol Itching    Current Outpatient Prescriptions  Medication Sig Dispense Refill  . Blood Glucose Monitoring Suppl (BLOOD GLUCOSE METER KIT AND SUPPLIES) Dispense based on patient and insurance preference. Use to monitor FSBS once daily as directed. Dx: 250.00 1 each 0  .  clotrimazole (LOTRIMIN) 1 % cream Apply 1 application topically 2 (two) times daily. To feet (Patient not taking: Reported on 03/15/2016) 30 g 2  . FLUoxetine (PROZAC) 40 MG capsule TAKE 1 CAPSULE BY MOUTH EVERY DAY. 90 capsule 1  . gabapentin (NEURONTIN) 300 MG capsule Take 1 capsule (300 mg total) by mouth at bedtime. For nerve pain 90 capsule 1  . glucose blood (EASYMAX TEST) test strip Dispense based on patient and insurance preference. Use to monitor FSBS once daily as directed. Dx: E11.9 50 each 11  . Lancet Devices MISC Dispense based on patient and insurance preference. Use to monitor FSBS once daily as directed. Dx: E11.9 1 each 1  . Lancets MISC Dispense based on patient and insurance preference. Use to monitor FSBS once daily as directed. Dx: E11.9 50 each 1  .  lisinopril-hydrochlorothiazide (PRINZIDE,ZESTORETIC) 20-25 MG tablet Take 1 tablet by mouth daily. (Patient not taking: Reported on 03/15/2016) 90 tablet 1  . meloxicam (MOBIC) 7.5 MG tablet Take 1 tablet (7.5 mg total) by mouth daily as needed for pain. For joint pain- please deliver 30 tablet 3  . metFORMIN (GLUCOPHAGE-XR) 500 MG 24 hr tablet TAKE ONE TABLET BY MOUTH DAILY WITH BREAKFAST FOR DIABETES. 90 tablet 1  . nitroGLYCERIN (NITROSTAT) 0.4 MG SL tablet Place 1 tablet (0.4 mg total) under the tongue every 5 (five) minutes as needed for chest pain. 25 tablet 1  . pravastatin (PRAVACHOL) 20 MG tablet Take 1 tablet (20 mg total) by mouth at bedtime. 90 tablet 1  . tiZANidine (ZANAFLEX) 4 MG tablet Take 1 tablet (4 mg total) by mouth every 8 (eight) hours as needed for muscle spasms. (Patient not taking: Reported on 03/15/2016) 30 tablet 1   No current facility-administered medications for this visit.        Physical Exam Blood pressure (!) 163/100, height _0  (1.549 m), weight 242 lb (109.8 kg). Physical Exam The patient is well developed well nourished and well groomed.  Orientation to person place and time is normal  Mood is pleasant.  Ambulatory status Normal Cervical spine exam is as follows nontender full range of motion Ortho Exam Right shoulder  Examination: Inspection reveals tenderness around the peri-acromial region. The patient has decreased range of motion and grade 5/ 5 motor function of the rotator cuff. Stability in abduction external rotation is normal.   Neurovascular examination is intact and the lymph nodes in the axilla and supraclavicular regions are normal   The opposite shoulder left exhibits normal range of motion stability and strength neurovascular exam is intact, lymph nodes are negative and there is no swelling or tenderness   Data Reviewed IMAGING: AP/LAT right Shoulder: I have read and interpret the x-ray as follows:   normal x-ray with mild greater  tuberosity sclerotic bone   Assessment    Right SHOULDER  ROTATOR CUFF SYNDROME  SHOULDER PAIN     Plan    NSAIDS/CONTINUE MELOXICAM Therapy INJECTION   Procedure note the subacromial injection shoulder RIGHT  Verbal consent was obtained to inject the  RIGHT   Shoulder  Timeout was completed to confirm the injection site is a subacromial space of the  RIGHT  shoulder  Medication used Depo-Medrol 40 mg and lidocaine 1% 3 cc  Anesthesia was provided by ethyl chloride  The injection was performed in the RIGHT  posterior subacromial space. After pinning the skin with alcohol and anesthetized the skin with ethyl chloride the subacromial space was injected using a 20-gauge needle.  There were no complications  Sterile dressing was applied.  F/U 6 WEEK Harolyn Rutherford, MD 03/15/2016 2:51 PM

## 2016-03-15 NOTE — Patient Instructions (Signed)
START PT   You have received an injection of steroids into the joint. 15% of patients will have increased pain within the 24 hours postinjection.   This is transient and will go away.   We recommend that you use ice packs on the injection site for 20 minutes every 2 hours and extra strength Tylenol 2 tablets every 8 as needed until the pain resolves.  If you continue to have pain after taking the Tylenol and using the ice please call the office for further instructions.

## 2016-04-13 ENCOUNTER — Encounter: Payer: Self-pay | Admitting: Family Medicine

## 2016-04-13 ENCOUNTER — Ambulatory Visit (INDEPENDENT_AMBULATORY_CARE_PROVIDER_SITE_OTHER): Payer: Medicare Other | Admitting: Family Medicine

## 2016-04-13 VITALS — BP 138/80 | HR 78 | Temp 98.9°F | Resp 14 | Ht 61.0 in | Wt 245.0 lb

## 2016-04-13 DIAGNOSIS — R55 Syncope and collapse: Secondary | ICD-10-CM

## 2016-04-13 LAB — CBC WITH DIFFERENTIAL/PLATELET
Basophils Absolute: 0 cells/uL (ref 0–200)
Basophils Relative: 0 %
Eosinophils Absolute: 122 cells/uL (ref 15–500)
Eosinophils Relative: 2 %
HCT: 39 % (ref 35.0–45.0)
Hemoglobin: 12.5 g/dL (ref 12.0–15.0)
Lymphocytes Relative: 32 %
Lymphs Abs: 1952 cells/uL (ref 850–3900)
MCH: 25.8 pg — ABNORMAL LOW (ref 27.0–33.0)
MCHC: 32.1 g/dL (ref 32.0–36.0)
MCV: 80.6 fL (ref 80.0–100.0)
MPV: 11 fL (ref 7.5–12.5)
Monocytes Absolute: 366 cells/uL (ref 200–950)
Monocytes Relative: 6 %
Neutro Abs: 3660 cells/uL (ref 1500–7800)
Neutrophils Relative %: 60 %
Platelets: 213 10*3/uL (ref 140–400)
RBC: 4.84 MIL/uL (ref 3.80–5.10)
RDW: 15.7 % — ABNORMAL HIGH (ref 11.0–15.0)
WBC: 6.1 10*3/uL (ref 3.8–10.8)

## 2016-04-13 LAB — BASIC METABOLIC PANEL
BUN: 22 mg/dL (ref 7–25)
CO2: 24 mmol/L (ref 20–31)
Calcium: 8.4 mg/dL — ABNORMAL LOW (ref 8.6–10.4)
Chloride: 106 mmol/L (ref 98–110)
Creat: 1.04 mg/dL (ref 0.50–1.05)
Glucose, Bld: 92 mg/dL (ref 70–99)
Potassium: 3.8 mmol/L (ref 3.5–5.3)
Sodium: 141 mmol/L (ref 135–146)

## 2016-04-13 NOTE — Patient Instructions (Signed)
Bring medications back  MRI to be done  No blood pressure medication F/U pending results

## 2016-04-13 NOTE — Progress Notes (Signed)
Orthostatic Blood Pressure:  Lying: 144/82  Sitting: 138/80  Standing: 118/70

## 2016-04-13 NOTE — Progress Notes (Signed)
Subjective:    Patient ID: Gwendolyn Woodward, female    DOB: [birth year], 50 y.o.   MRN: [id number]  Patient presents for S/P Fall (x5 days- states that she stepped off porch and blacke dout- landed on R side- R upper arm sore)   Patient here status post fall she states that she blacked out this past weekend. States that she wishes at a friend's house and she was crossing the street and she passed out. She states that this happens all the time. She's had 2 events this year that she did not bring it up at the last visit. She has had syncope in the past which we worked up she had an MRI which showed stroke in 2013. She felt like these episodes are normal since they keep happening to her. Occasionally she feels dizzy right before but typically has no specific aura before she passes out. When reviewing her medications she states that she is taking the diabetes medication but is not taking her other medicines regularly she states she is not taking a blood pressure pill but then became very confused trying to describe the pills that she was taking. She's to return to the office with her medications today She denies any current headache or dizziness. She does complain a right arm shoulder pain which she's been having problems with for quite some time. She was seen by orthopedics about 4 weeks ago she had steroid injection performed for rotator cuff syndrome she states that this did not help. She has a follow-up in 2 weeks. She still able to move her arm is previous    Review Of Systems:  GEN- denies fatigue, fever, weight loss,weakness, recent illness HEENT- denies eye drainage, change in vision, nasal discharge, CVS- denies chest pain, palpitations RESP- denies SOB, cough, wheeze ABD- denies N/V, change in stools, abd pain GU- denies dysuria, hematuria, dribbling, incontinence MSK-+joint pain, muscle aches, injury Neuro- denies headache, dizziness,+ syncope, seizure activity       Objective:    BP 138/80 (BP Location: Right Arm, Patient Position: Sitting, Cuff Size: Large)   Pulse 78   Temp 98.9 F (37.2 C) (Oral)   Resp 14   Ht 5\' 1"  (1.549 m)   Wt 245 lb (111.1 kg)   BMI 46.29 kg/m  GEN- NAD, alert and oriented x3 HEENT- PERRL, EOMI, non injected sclera, pink conjunctiva, MMM, oropharynx clear Neck- Supple, no bruit  CVS- RRR, no murmur RESP-CTAB Neuro- CNII-XII in tact, no deficits, no nystagmis, normal gait, normal speech  MSK-  EXT- No edema Pulses- Radial, DP- 2+  EKG-NSR, no ST changes       Assessment & Plan:      Problem List Items Addressed This Visit    None    Visit Diagnoses    Syncope and collapse    -  Primary   unclear cause but recurrent episodes, in past had events had MRI about 3 years ago shows CVA, I dont recall her seeing neurology after work up but was referred.She states that she is been having this for years will never really bring it up she thought it was normal. She's had 2 episodes this year. I'm concerned she is having these recurrent episodes of unknown cause. Possible seizure disorder she is not taking her medications as prescribed and states that she has not been taking her blood pressure medicines but at any rate I will have her hold this for now. She is asked to come back  to the office with all of her medications so we can see if she is taking something incorrectly that might be contributing to her syncope events. Her EKG looks reassuring. Again obtain an MRI of the brain will also get repeat carotid Dopplers as she does have hyperlipidemia and diabetes mellitus. If nothing particular seen in our recommend EEG   As regards to the right shoulder pain this is chronic pain and then she fell. She still has fairly good range of motion short has follow-up with orthopedics in 2 weeks    Relevant Orders   EKG 12-Lead (Completed)   CBC with Differential/Platelet   Basic metabolic panel      Note: This dictation was prepared with Dragon  dictation along with smaller phrase technology. Any transcriptional errors that result from this process are unintentional.

## 2016-05-04 ENCOUNTER — Ambulatory Visit (HOSPITAL_COMMUNITY)
Admission: RE | Admit: 2016-05-04 | Discharge: 2016-05-04 | Disposition: A | Discharge status: Home / Self Care | Payer: Medicare Other | Source: Ambulatory Visit | Attending: Family Medicine | Admitting: Family Medicine

## 2016-05-04 DIAGNOSIS — R55 Syncope and collapse: Secondary | ICD-10-CM | POA: Diagnosis not present

## 2016-05-06 ENCOUNTER — Other Ambulatory Visit: Payer: Self-pay | Admitting: *Deleted

## 2016-05-06 DIAGNOSIS — R55 Syncope and collapse: Secondary | ICD-10-CM

## 2016-06-03 ENCOUNTER — Other Ambulatory Visit: Payer: Self-pay | Admitting: Family Medicine

## 2016-06-03 ENCOUNTER — Ambulatory Visit (INDEPENDENT_AMBULATORY_CARE_PROVIDER_SITE_OTHER): Payer: Medicare Other | Admitting: Neurology

## 2016-06-03 ENCOUNTER — Encounter: Payer: Self-pay | Admitting: Neurology

## 2016-06-03 VITALS — BP 202/124 | HR 86 | Resp 20 | Ht 61.0 in | Wt 246.0 lb

## 2016-06-03 DIAGNOSIS — R55 Syncope and collapse: Secondary | ICD-10-CM | POA: Diagnosis not present

## 2016-06-03 NOTE — Patient Instructions (Signed)
   We will check an EEG study and get a 30 day heart monitor.

## 2016-06-03 NOTE — Progress Notes (Signed)
Reason for visit: Syncope  Referring physician: Dr. Maryla Morrow is a 50 y.o. female  History of present illness:  Gwendolyn Woodward is a 50 year old right-handed black female with a history of recurrent syncope that dates back at least 20 years. She also has a history of significant hypertension and diabetes. The patient averages about 2 syncopal events a year, but she has had increased frequency over the last one year. The patient is not a good historian, but she indicates that she may feel dizzy prior to the black out, she may see spots in front of the eyes. If she lies down quickly she can prevent a blackout. The patient denies any tongue biting or any episodes of bowel or bladder incontinence with the syncopal events but she does have some stress incontinence of the bladder at times. She also reports a greater than 10 year history of headaches associated with photophobia and phonophobia. She does not clearly relate the headaches to the syncopal events. The patient has occasional episodes of chest pain, but she does not report palpitations of the heart. She has some left hand numbness, she has had a prior right frontal stroke in the past. She recently had MRI evaluation of the brain that shows no acute changes, the old right brain stroke was noted. The patient was seen by her primary care physician, and a carotid Doppler study was ordered, this has not yet been done.  Past Medical History:  Diagnosis Date  . Arthritis    back pain and knee pain, no meds  . Asthma   . Chest pain    + dyspnea  . CVA (cerebral infarction) 1984 , 1988   x2, left sided weakness, history of dizziness  . Depression with anxiety    History- no meds  . Dysphagia   . Fasting hyperglycemia   . Gastroesophageal reflux disease    no meds  . Headache(784.0)    OTC med PRN  . History of anemia    S/p transfusions in 1999 at [facility] after vaginal bleeding; normal CBC and 09/2011  . Hyperlipidemia    lipid  profile in 11/2011: 203, 106, 58, 124  . Hypertension    Lab 09/2011: Normal CMet except glucose of 109-169 and albumin-3.4; normal BP 09/2011-12/2011  . Sleep apnea    Does not use CPAP - no longer has CPAP machine  . Stroke [facility])     Past Surgical History:  Procedure Laterality Date  . [place]   x 2  . COLONOSCOPY  01/27/2012   Procedure: COLONOSCOPY;  Surgeon: Danie Binder, MD;  Location: [facility];  Service: Endoscopy;  Laterality: N/A;  8:30  . ENDOMETRIAL ABLATION  Feb 2013  . ESOPHAGOGASTRODUODENOSCOPY  5/11   [facility]-Dr Iva Lento GERD distal esophagus, NEGATIVE bx for Barretts  . IUD REMOVAL  10/20/2011   Procedure: INTRAUTERINE DEVICE (IUD) REMOVAL;  Surgeon: Allena Katz, MD;  Location: [facility];  Service: Gynecology;  Laterality: N/A;  . TUBAL LIGATION    . WISDOM TOOTH EXTRACTION      Family History  Problem Relation Age of Onset  . Anesthesia problems Neg Hx   . Hypertension Mother   . Asthma Mother   . Cancer Father     Lung/Throat  . Heart disease Father   . Diabetes Maternal Uncle     Social history:  reports that she has never smoked. She has never used smokeless tobacco. She reports  that she drinks alcohol. She reports that she does not use drugs.  Medications:  Prior to Admission medications   Medication Sig Start Date End Date Taking? Authorizing Provider  gabapentin (NEURONTIN) 300 MG capsule Take 1 capsule (300 mg total) by mouth at bedtime. For nerve pain 02/23/16  Yes Alycia Rossetti, MD  lisinopril-hydrochlorothiazide (PRINZIDE,ZESTORETIC) 20-25 MG tablet Take 1 tablet by mouth daily. 11/12/15  Yes Alycia Rossetti, MD  metFORMIN (GLUCOPHAGE-XR) 500 MG 24 hr tablet TAKE ONE TABLET BY MOUTH DAILY WITH BREAKFAST FOR DIABETES. 02/23/16  Yes Alycia Rossetti, MD  pravastatin (PRAVACHOL) 20 MG tablet Take 1 tablet (20 mg total) by mouth at bedtime. 02/23/16  Yes Alycia Rossetti, MD  Blood Glucose Monitoring Suppl (BLOOD  GLUCOSE METER KIT AND SUPPLIES) Dispense based on patient and insurance preference. Use to monitor FSBS once daily as directed. Dx: 250.00 Patient not taking: Reported on 06/03/2016 02/28/14   Alycia Rossetti, MD  clotrimazole (LOTRIMIN) 1 % cream Apply 1 application topically 2 (two) times daily. To feet Patient not taking: Reported on 06/03/2016 11/25/14   Alycia Rossetti, MD  FLUoxetine (PROZAC) 40 MG capsule TAKE 1 CAPSULE BY MOUTH EVERY DAY. Patient not taking: Reported on 06/03/2016 02/23/16   Alycia Rossetti, MD  glucose blood (EASYMAX TEST) test strip Dispense based on patient and insurance preference. Use to monitor FSBS once daily as directed. Dx: E11.9 Patient not taking: Reported on 06/03/2016 02/23/16   Alycia Rossetti, MD  Lancet Devices MISC Dispense based on patient and insurance preference. Use to monitor FSBS once daily as directed. Dx: E11.9 Patient not taking: Reported on 06/03/2016 02/23/16   Alycia Rossetti, MD  Lancets MISC Dispense based on patient and insurance preference. Use to monitor FSBS once daily as directed. Dx: E11.9 Patient not taking: Reported on 06/03/2016 02/23/16   Alycia Rossetti, MD  meloxicam (MOBIC) 7.5 MG tablet Take 1 tablet (7.5 mg total) by mouth daily as needed for pain. For joint pain- please deliver 02/23/16   Alycia Rossetti, MD  nitroGLYCERIN (NITROSTAT) 0.4 MG SL tablet Place 1 tablet (0.4 mg total) under the tongue every 5 (five) minutes as needed for chest pain. Patient not taking: Reported on 06/03/2016 12/12/13   Alycia Rossetti, MD  tiZANidine (ZANAFLEX) 4 MG tablet Take 1 tablet (4 mg total) by mouth every 8 (eight) hours as needed for muscle spasms. Patient not taking: Reported on 06/03/2016 01/22/16   Orlena Sheldon, PA-C      Allergies  Allergen Reactions  . Aspirin     INTERNAL BLEEDING  . Tramadol Itching    ROS:  Out of a complete 14 system review of symptoms, the patient complains only of the following symptoms, and all other  reviewed systems are negative.  Chills, weight gain Chest pain, swelling in the legs Difficulty swallowing Birthmarks, moles Blurred vision, double vision, eye pain Shortness of breath, wheezing, snoring Constipation Urination problems Easy bruising Feeling hot, cold Joint pain, muscle cramps, aching muscles Runny nose Memory loss, headache, numbness, weakness, difficulty swallowing, dizziness, passing out Depression, not enough sleep, decreased energy  Blood pressure (!) 202/124, pulse 86, resp. rate 20, height _0  (1.549 m), weight 246 lb (111.6 kg).  Physical Exam  General: The patient is alert and cooperative at the time of the examination. The patient is markedly obese.  Eyes: Pupils are equal, round, and reactive to light. Discs are flat bilaterally.  Neck: The neck is supple,  no carotid bruits are noted.  Respiratory: The respiratory examination is clear.  Cardiovascular: The cardiovascular examination reveals a regular rate and rhythm, no obvious murmurs or rubs are noted.  Skin: Extremities are without significant edema.  Neurologic Exam  Mental status: The patient is alert and oriented x 3 at the time of the examination. The patient has apparent normal recent and remote memory, with an apparently normal attention span and concentration ability.  Cranial nerves: Facial symmetry is present. There is good sensation of the face to pinprick and soft touch bilaterally. The strength of the facial muscles and the muscles to head turning and shoulder shrug are normal bilaterally. Speech is well enunciated, no aphasia or dysarthria is noted. Extraocular movements are full. Visual fields are full. The tongue is midline, and the patient has symmetric elevation of the soft palate. No obvious hearing deficits are noted.  Motor: The motor testing reveals 5 over 5 strength of all 4 extremities. Good symmetric motor tone is noted throughout.  Sensory: Sensory testing is intact to  pinprick, soft touch, vibration sensation, and position sense on all 4 extremities. No evidence of extinction is noted.  Coordination: Cerebellar testing reveals good finger-nose-finger and heel-to-shin bilaterally.  Gait and station: Gait is normal. Tandem gait is normal. Romberg is negative. No drift is seen.  Reflexes: Deep tendon reflexes are symmetric and normal bilaterally. Toes are downgoing bilaterally.   MRI brain 05/04/16:  IMPRESSION: No change since 2013. No acute finding. No cause of syncope identified. Old right insular and posterior frontal cortical and subcortical infarction.  * MRI scan images were reviewed online. I agree with the written report.   Assessment/Plan:  1. Recurrent syncope  2. History of right frontal stroke  3. History of migraine headache  The patient has a very high blood pressure today, but she did not apparently take her blood pressure medications earlier. She is to go home and take her medication. The episodes of syncope can be aborted by lying down, this suggests that the episodes are related to a drop in blood pressure, likely not seizures. The patient will be set up for an EEG study, she will have a 30 day cardiac monitor study. She will follow-up in 4 months. I have asked her not to operate a motor vehicle until the etiology of these syncopal events have been determined.  Jill Alexanders MD 06/03/2016 2:03 PM  [facility] [address] [place], [place] [address]  Phone [phone] Fax [phone]

## 2016-06-04 ENCOUNTER — Other Ambulatory Visit: Payer: Self-pay | Admitting: *Deleted

## 2016-06-04 MED ORDER — LISINOPRIL-HYDROCHLOROTHIAZIDE 20-25 MG PO TABS: 1.0000 | ORAL_TABLET | Freq: Every day | ORAL | 1 refills | Status: DC

## 2016-06-09 ENCOUNTER — Encounter: Payer: Self-pay | Admitting: Neurology

## 2016-06-23 ENCOUNTER — Other Ambulatory Visit: Payer: Medicare Other

## 2016-06-24 ENCOUNTER — Telehealth: Payer: Self-pay | Admitting: Neurology

## 2016-06-24 ENCOUNTER — Ambulatory Visit (INDEPENDENT_AMBULATORY_CARE_PROVIDER_SITE_OTHER): Payer: Medicare Other | Admitting: Neurology

## 2016-06-24 ENCOUNTER — Ambulatory Visit (INDEPENDENT_AMBULATORY_CARE_PROVIDER_SITE_OTHER): Payer: Medicare Other

## 2016-06-24 DIAGNOSIS — R55 Syncope and collapse: Secondary | ICD-10-CM

## 2016-06-24 NOTE — Telephone Encounter (Signed)
I called patient. EEG study was unremarkable, the cardiac monitor study has been placed.

## 2016-06-24 NOTE — Procedures (Signed)
    History:  Gwendolyn Woodward is a 50 year old patient with a history of diabetes and hypertension. The patient has had multiple syncopal events, averaging about 2 year. The patient will have sensations of dizziness prior the black out, she may see spots in front eyes. If she lies down quickly she may prevent the black out. The patient is being evaluated for these episodes.  This is a routine EEG. No skull defects are noted. Medications include gabapentin, lisinopril, metformin, pravastatin, Prozac, Mobic, and Zanaflex.   EEG classification: Normal awake and asleep  Description of the recording: The background rhythms of this recording consists of a fairly well modulated medium amplitude background activity of 9 Hz. As the record progresses, the patient initially is in the waking state, but appears to enter the early stage II sleep during the recording, with rudimentary sleep spindles and vertex sharp wave activity seen. During the wakeful state, photic stimulation is performed, and this results in a bilateral and symmetric photic driving response. Hyperventilation was also performed, and this results in a minimal buildup of the background rhythm activities without significant slowing seen. At no time during the recording does there appear to be evidence of spike or spike wave discharges or evidence of focal slowing. EKG monitor shows no evidence of cardiac rhythm abnormalities with a heart rate of 84.  Impression: This is a normal EEG recording in the waking and sleeping state. No evidence of ictal or interictal discharges were seen at any time during the recording.

## 2016-08-02 ENCOUNTER — Telehealth: Payer: Self-pay | Admitting: Neurology

## 2016-08-02 MED ORDER — LEVETIRACETAM 500 MG PO TABS: ORAL_TABLET | ORAL | 3 refills | Status: DC

## 2016-08-02 NOTE — Telephone Encounter (Signed)
I called patient. The patient had a cardiac monitor that was unremarkable, the patient had frequent episodes of dizziness, chest pain, and near-syncope. No evidence of cardiac rhythm abnormalities.  EEG study was unremarkable, but we may give an empiric trial on Keppra. A prescription will be called in.   Cardiac event monitor 08/02/16:   The patient was monitored for 707:18 hours, of which 623:55 hours were usable.  The predominant rhythm was normal sinus, with an average rate of 89 bpm.  No significant ventricular or supraventricular ectopy, sustained arrhythmia, or prolonged pauses were identified.  27 manually-triggered recordings posted with symptoms including chest pain, dizziness/lightheaded, and passed out were reviewed; all episodes correspond to sinus rhythm.   No significant arrhythmias.

## 2016-10-05 ENCOUNTER — Ambulatory Visit: Payer: Medicare Other | Admitting: Neurology

## 2016-10-05 ENCOUNTER — Telehealth: Payer: Self-pay | Admitting: Neurology

## 2016-10-05 NOTE — Telephone Encounter (Signed)
This patient did not show for a revisit appointment today. 

## 2016-10-08 ENCOUNTER — Encounter: Payer: Self-pay | Admitting: Neurology

## 2016-10-27 DIAGNOSIS — E119 Type 2 diabetes mellitus without complications: Secondary | ICD-10-CM | POA: Diagnosis not present

## 2016-10-27 LAB — HM DIABETES EYE EXAM

## 2016-10-28 ENCOUNTER — Encounter: Payer: Self-pay | Admitting: Family Medicine

## 2016-12-06 ENCOUNTER — Ambulatory Visit (INDEPENDENT_AMBULATORY_CARE_PROVIDER_SITE_OTHER): Payer: Medicare Other | Admitting: Family Medicine

## 2016-12-06 ENCOUNTER — Encounter: Payer: Self-pay | Admitting: Family Medicine

## 2016-12-06 VITALS — BP 142/80 | HR 90 | Temp 98.6°F | Resp 16 | Ht 61.0 in | Wt 251.0 lb

## 2016-12-06 DIAGNOSIS — E7800 Pure hypercholesterolemia, unspecified: Secondary | ICD-10-CM | POA: Diagnosis not present

## 2016-12-06 DIAGNOSIS — E119 Type 2 diabetes mellitus without complications: Secondary | ICD-10-CM

## 2016-12-06 DIAGNOSIS — R42 Dizziness and giddiness: Secondary | ICD-10-CM

## 2016-12-06 DIAGNOSIS — I10 Essential (primary) hypertension: Secondary | ICD-10-CM | POA: Diagnosis not present

## 2016-12-06 DIAGNOSIS — Z6841 Body Mass Index (BMI) 40.0 and over, adult: Secondary | ICD-10-CM | POA: Diagnosis not present

## 2016-12-06 MED ORDER — MECLIZINE HCL 12.5 MG PO TABS: 12.5000 mg | ORAL_TABLET | Freq: Two times a day (BID) | ORAL | 0 refills | Status: DC | PRN

## 2016-12-06 MED ORDER — LISINOPRIL 10 MG PO TABS: ORAL_TABLET | ORAL | 2 refills | Status: DC

## 2016-12-06 NOTE — Progress Notes (Signed)
Subjective:    Patient ID: Gwendolyn Woodward, female    DOB: 1966/02/25, 51 y.o.   MRN: [id number]  Patient presents for Vertigo (states that she has been having dizziness- states that she feels like she gets hot and then has spots in vision before she gets dizzy- reports that she will use fan to cool down or sit down for a while and then feels better )  She here with recurrent episodes of dizziness. States she gets dizzy and feels flushed if she sits down cools off then she feels better. This happens very intermittently she has not no brings it on. No she's had these issues in the past along with syncope episodes. MRI of the brain which was unremarkable if she had workup by neurology including wearing a heart monitor all which were benign. There was no sign of seizure activity either. Her blood pressure was significantly high the last neurology visit but this is been greater than 4 months ago. The only medication she is currently taking his metformin 500 mg once a day and pravastatin. He denies any chest pain or shortness of breath in the recent illness.   Review Of Systems:  GEN- denies fatigue, fever, weight loss,weakness, recent illness HEENT- denies eye drainage, change in vision, nasal discharge, CVS- denies chest pain, palpitations RESP- denies SOB, cough, wheeze ABD- denies N/V, change in stools, abd pain GU- denies dysuria, hematuria, dribbling, incontinence MSK- denies joint pain, muscle aches, injury Neuro- denies headache, +dizziness, syncope, seizure activity       Objective:    BP (!) 142/80 (BP Location: Right Arm, Patient Position: Standing, Cuff Size: Large)   Pulse 90   Temp 98.6 F (37 C) (Oral)   Resp 16   Ht 5\' 1"  (1.549 m)   Wt 251 lb (113.9 kg)   SpO2 97%   BMI 47.43 kg/m  GEN- NAD, alert and oriented x3  HEENT- PERRL, EOMI, non injected sclera, pink conjunctiva, MMM, oropharynx clear,TM Clear no effusion, nares clear  Neck- Supple, no bruit, no  thyromegaly CVS- RRR, no murmur RESP-CTAB Neuro-CNII-XII in tact, no deficits  EXT- No edema Pulses- Radial - 2+        Assessment & Plan:      Problem List Items Addressed This Visit    Obesity   Hypertension - Primary    She is multiple reasons that could be contributing to her izziness. Her blood pressure is elevated she is also diabetic and has not had any follow-up. We will need to check some labs see what is going on. Can restart her lisinopril at 10 mg once a day. She has gained weight and is possible that her blood sugar is out of control. She has been evaluated by neurology in the past and nothing structural as been found to be the cause of her spells either. I did give her some meclizine to have on hand in case her spells lasted but they do seem quite short-lived.      Relevant Medications   lisinopril (PRINIVIL,ZESTRIL) 10 MG tablet   Hyperlipidemia - check non fasting lipids    Relevant Medications   lisinopril (PRINIVIL,ZESTRIL) 10 MG tablet   Other Relevant Orders   Lipid panel   Diabetes mellitus ([place])  - adjust metformin goal < 7%   Relevant Medications   lisinopril (PRINIVIL,ZESTRIL) 10 MG tablet   Other Relevant Orders   CBC with Differential/Platelet   Comprehensive metabolic panel   Hemoglobin A1c    Dizziness-  per above     Note: This dictation was prepared with Dragon dictation along with smaller phrase technology. Any transcriptional errors that result from this process are unintentional.

## 2016-12-06 NOTE — Patient Instructions (Addendum)
Take Lisinopril once a day for your blood pressure Take meclizine as needed for dizziness F/U May 1st  Give work note for today

## 2016-12-06 NOTE — Assessment & Plan Note (Signed)
She is multiple rhesus echo be contributing to some dizziness. Her blood pressure is elevated she is also diabetic and has not had any follow-up. We will need to check some labs see what is going on. Can restart her lisinopril at 10 mg once a day. She has gained weight and is possible that her blood sugar is out of control. She has been evaluated by neurology in the past and nothing structural as been found to be the cause of her spells either. I did give her some meclizine to have on hand in case her spells lasted but they do seem quite short-lived.

## 2016-12-07 ENCOUNTER — Encounter: Payer: Self-pay | Admitting: Family Medicine

## 2016-12-07 LAB — LIPID PANEL
Cholesterol: 177 mg/dL (ref <200)
HDL: 67 mg/dL (ref >50)
LDL Cholesterol: 91 mg/dL (ref <100)
Total CHOL/HDL Ratio: 2.6 Ratio (ref <5.0)
Triglycerides: 96 mg/dL (ref <150)
VLDL: 19 mg/dL (ref <30)

## 2016-12-07 LAB — CBC WITH DIFFERENTIAL/PLATELET
Basophils Absolute: 0 cells/uL (ref 0–200)
Basophils Relative: 0 %
Eosinophils Absolute: 116 cells/uL (ref 15–500)
Eosinophils Relative: 2 %
HCT: 40.8 % (ref 35.0–45.0)
Hemoglobin: 12.7 g/dL (ref 12.0–15.0)
Lymphocytes Relative: 39 %
Lymphs Abs: 2262 cells/uL (ref 850–3900)
MCH: 24.9 pg — ABNORMAL LOW (ref 27.0–33.0)
MCHC: 31.1 g/dL — ABNORMAL LOW (ref 32.0–36.0)
MCV: 80 fL (ref 80.0–100.0)
MPV: 10.7 fL (ref 7.5–12.5)
Monocytes Absolute: 464 cells/uL (ref 200–950)
Monocytes Relative: 8 %
Neutro Abs: 2958 cells/uL (ref 1500–7800)
Neutrophils Relative %: 51 %
Platelets: 209 10*3/uL (ref 140–400)
RBC: 5.1 MIL/uL (ref 3.80–5.10)
RDW: 15.5 % — ABNORMAL HIGH (ref 11.0–15.0)
WBC: 5.8 10*3/uL (ref 3.8–10.8)

## 2016-12-07 LAB — COMPREHENSIVE METABOLIC PANEL
ALT: 18 U/L (ref 6–29)
AST: 15 U/L (ref 10–35)
Albumin: 3.6 g/dL (ref 3.6–5.1)
Alkaline Phosphatase: 84 U/L (ref 33–130)
BUN: 17 mg/dL (ref 7–25)
CO2: 27 mmol/L (ref 20–31)
Calcium: 9.4 mg/dL (ref 8.6–10.4)
Chloride: 107 mmol/L (ref 98–110)
Creat: 1.03 mg/dL (ref 0.50–1.05)
Glucose, Bld: 104 mg/dL — ABNORMAL HIGH (ref 70–99)
Potassium: 4.2 mmol/L (ref 3.5–5.3)
Sodium: 142 mmol/L (ref 135–146)
Total Bilirubin: 0.2 mg/dL (ref 0.2–1.2)
Total Protein: 6.8 g/dL (ref 6.1–8.1)

## 2016-12-07 LAB — HEMOGLOBIN A1C
Hgb A1c MFr Bld: 5.9 % — ABNORMAL HIGH (ref <5.7)
Mean Plasma Glucose: 123 mg/dL

## 2016-12-15 ENCOUNTER — Encounter: Payer: Self-pay | Admitting: *Deleted

## 2016-12-16 ENCOUNTER — Other Ambulatory Visit: Payer: Self-pay | Admitting: *Deleted

## 2016-12-16 MED ORDER — LISINOPRIL 10 MG PO TABS: ORAL_TABLET | ORAL | 2 refills | Status: DC

## 2017-03-07 ENCOUNTER — Telehealth: Payer: Self-pay

## 2017-03-07 ENCOUNTER — Encounter: Payer: Self-pay | Admitting: *Deleted

## 2017-03-07 NOTE — Telephone Encounter (Signed)
Patient called in complaining about numbness on left side arm and leg and right side numbness and pain in right arm. Patient states this has been going on off and on for about 3 weeks.  Patient wanted to make an appointment to be seen advised patient she needed to be seen right away. Pt was instructed to go to Urgent care or ER. Pt stated that was why she was trying to make an appointment to be seen next avail appt was 7/20 with PCP. Advised pt she needed to be seen sooner to make sure it's not something serious going on and to make sure her b/p is not up.

## 2017-03-07 NOTE — Telephone Encounter (Signed)
This encounter was created in error - please disregard.

## 2017-03-07 NOTE — Telephone Encounter (Signed)
Received call from patient.   States that she is having Left sided numbness, and intermittent Right sided numbness. Patient very upset and adamant that she needs to be seen by PCP. Advised that numbness is serious symptom and needs to be evaluated. Advised that triage was correct in recommending ER evaluation.   Patient became notably upset and hung up phone.

## 2017-03-07 NOTE — Telephone Encounter (Signed)
Agree with above 

## 2017-03-16 ENCOUNTER — Ambulatory Visit (INDEPENDENT_AMBULATORY_CARE_PROVIDER_SITE_OTHER): Payer: Medicare Other | Admitting: Family Medicine

## 2017-03-16 ENCOUNTER — Encounter: Payer: Self-pay | Admitting: Family Medicine

## 2017-03-16 VITALS — BP 144/88 | HR 76 | Temp 98.4°F | Resp 18 | Ht 61.0 in | Wt 248.0 lb

## 2017-03-16 DIAGNOSIS — R0600 Dyspnea, unspecified: Secondary | ICD-10-CM | POA: Diagnosis not present

## 2017-03-16 DIAGNOSIS — E119 Type 2 diabetes mellitus without complications: Secondary | ICD-10-CM

## 2017-03-16 DIAGNOSIS — R2990 Unspecified symptoms and signs involving the nervous system: Secondary | ICD-10-CM

## 2017-03-16 DIAGNOSIS — I10 Essential (primary) hypertension: Secondary | ICD-10-CM

## 2017-03-16 DIAGNOSIS — Z8673 Personal history of transient ischemic attack (TIA), and cerebral infarction without residual deficits: Secondary | ICD-10-CM | POA: Diagnosis not present

## 2017-03-16 LAB — CBC WITH DIFFERENTIAL/PLATELET
Basophils Absolute: 0 cells/uL (ref 0–200)
Basophils Relative: 0 %
Eosinophils Absolute: 60 cells/uL (ref 15–500)
Eosinophils Relative: 1 %
HCT: 38.8 % (ref 35.0–45.0)
Hemoglobin: 12.4 g/dL (ref 12.0–15.0)
Lymphocytes Relative: 31 %
Lymphs Abs: 1860 cells/uL (ref 850–3900)
MCH: 25.3 pg — ABNORMAL LOW (ref 27.0–33.0)
MCHC: 32 g/dL (ref 32.0–36.0)
MCV: 79.2 fL — ABNORMAL LOW (ref 80.0–100.0)
MPV: 10.9 fL (ref 7.5–12.5)
Monocytes Absolute: 300 cells/uL (ref 200–950)
Monocytes Relative: 5 %
Neutro Abs: 3780 cells/uL (ref 1500–7800)
Neutrophils Relative %: 63 %
Platelets: 198 10*3/uL (ref 140–400)
RBC: 4.9 MIL/uL (ref 3.80–5.10)
RDW: 16.1 % — ABNORMAL HIGH (ref 11.0–15.0)
WBC: 6 10*3/uL (ref 3.8–10.8)

## 2017-03-16 LAB — COMPREHENSIVE METABOLIC PANEL
ALT: 12 U/L (ref 6–29)
AST: 10 U/L (ref 10–35)
Albumin: 3.8 g/dL (ref 3.6–5.1)
Alkaline Phosphatase: 88 U/L (ref 33–130)
BUN: 20 mg/dL (ref 7–25)
CO2: 21 mmol/L (ref 20–31)
Calcium: 8.9 mg/dL (ref 8.6–10.4)
Chloride: 104 mmol/L (ref 98–110)
Creat: 1.12 mg/dL — ABNORMAL HIGH (ref 0.50–1.05)
Glucose, Bld: 90 mg/dL (ref 70–99)
Potassium: 3.9 mmol/L (ref 3.5–5.3)
Sodium: 140 mmol/L (ref 135–146)
Total Bilirubin: 0.4 mg/dL (ref 0.2–1.2)
Total Protein: 6.6 g/dL (ref 6.1–8.1)

## 2017-03-16 MED ORDER — ALBUTEROL SULFATE HFA 108 (90 BASE) MCG/ACT IN AERS: 2.0000 | INHALATION_SPRAY | Freq: Four times a day (QID) | RESPIRATORY_TRACT | 1 refills | Status: DC | PRN

## 2017-03-16 NOTE — Patient Instructions (Addendum)
Increase lisinopril to 20mg   MRI of brain to be done - [facility]- call and schedule  CXR get done [facility],  We will call with lab results Test strips to be sent in  Get the inhaler  F/U pending results

## 2017-03-16 NOTE — Assessment & Plan Note (Addendum)
Repeat labs not on metformin, this is a factor for cerebrovascular accident.  With regards to blood pressure may increase her lisinopril to 20 mg once a day.   For the shortness of breath I do not see an EKG to suggest she has had a recent heart attack or any arrhythmia. She may have some underlying pulmonary disease. Arm and have her get a chest x-ray L also give her albuterol inhaler to try when she has the episodes. Interesting is that she can do her regular exercise which is a walk about 5 miles may only have her rest once or twice. She has allergy to aspirin states that she had a GI bleed while taking this someone hold off completing her on anything until I know for sure she is had event and we will use something like Plavix   Regards to the numbness symptoms and strokelike symptoms in the past she has had cerebrovascular accident. I'm been obtain a repeat MRI to see if she's been having smaller events over the past few weeks. I do not see any residual deficits today. Sometimes it is difficult to get a history out of her.

## 2017-03-16 NOTE — Progress Notes (Addendum)
Subjective:    Patient ID: Gwendolyn Woodward, female    DOB: 1966-08-18, 51 y.o.   MRN: [id number]  Patient presents for SOB on exertion (states that she feels tired alot after walking or working and has to sit down and catch her breath) and Numbness (reports x2 episodes of numbness to L side (hand/ arm/ foot))  Dizziness spells,and numbness down left hand,and drawing of her foot. This has happened twice over the past 2 weeks. Last occurred last week. No chest pain but has had intermittant shortness of breath with walking.   She is taking her BP meds On the hother hand states she exercises walks 5 miles and sit occasionally , she walked Friday and felt okay  Had MRI in 2017, has had syncopal episodes,This did show some old infarcts.  Does not have metformin or the pravastatin or the meclizine  Denies any recent illness no fever cough congestion at the end of this she states that she does get some wheezing when she is a shortness of breath.  Review Of Systems:  GEN- denies fatigue, fever, weight loss,weakness, recent illness HEENT- denies eye drainage, change in vision, nasal discharge, CVS- denies chest pain, palpitations RESP- +SOB, cough, +wheeze ABD- denies N/V, change in stools, abd pain GU- denies dysuria, hematuria, dribbling, incontinence MSK- denies joint pain, muscle aches, injury Neuro- denies headache, dizziness, syncope, seizure activity       Objective:    BP (!) 144/88   Pulse 76   Temp 98.4 F (36.9 C) (Oral)   Resp 18   Ht 5\' 1"  (1.549 m)   Wt 248 lb (112.5 kg)   SpO2 95%   BMI 46.86 kg/m  GEN- NAD, alert and oriented x3  HEENT- PERRL, EOMI, non injected sclera, pink conjunctiva, MMM, oropharynx clear,TM Clear no effusion, nares clear  Neck- Supple, no bruit, no thyromegaly CVS- RRR, no murmur RESP-CTAB Neuro-CNII-XII in tact, no deficits  EXT- No edema Pulses- Radial - 2+  EKG- NSR, Right atrial enlargement      Assessment & Plan:     Note she  did call and twice about this she it was recommended she go to the emergency room during the  events but she refused a fish she would only come here to be seen. She did not one gets the ER for any workup. I discussed with her why we were indicating that she gets emergency room and she could afford to have this evaluated especially if she was having a stroke at the time. Problem List Items Addressed This Visit    History of CVA (cerebrovascular accident)   Relevant Orders   MR Brain Wo Contrast   Hypertension   Diabetes mellitus ([place]) - Primary    Repeat labs not on metformin, this is a factor for cerebrovascular accident.  With regards to blood pressure may increase her lisinopril to 20 mg once a day.   For the shortness of breath I do not see an EKG to suggest she has had a recent heart attack or any arrhythmia. She may have some underlying pulmonary disease. Arm and have her get a chest x-ray L also give her albuterol inhaler to try when she has the episodes. Interesting is that she can do her regular exercise which is a walk about 5 miles may only have her rest once or twice. She has allergy to aspirin states that she had a GI bleed while taking this someone hold off completing her on anything until I  know for sure she is had event and we will use something like Plavix   Regards to the numbness symptoms and strokelike symptoms in the past she has had cerebrovascular accident. I'm been obtain a repeat MRI to see if she's been having smaller events over the past few weeks. I do not see any residual deficits today. Sometimes it is difficult to get a history out of her.       Relevant Orders   CBC with Differential/Platelet   Comprehensive metabolic panel   Hemoglobin A1c    Other Visit Diagnoses    Stroke-like symptom       Relevant Orders   MR Brain Wo Contrast   Dyspnea, unspecified type       Relevant Orders   EKG 12-Lead (Completed)   DG Chest 2 View      Note: This dictation  was prepared with Dragon dictation along with smaller phrase technology. Any transcriptional errors that result from this process are unintentional.

## 2017-03-17 ENCOUNTER — Ambulatory Visit
Admission: RE | Admit: 2017-03-17 | Discharge: 2017-03-17 | Disposition: A | Discharge status: Home / Self Care | Payer: Medicare Other | Source: Ambulatory Visit | Attending: Family Medicine | Admitting: Family Medicine

## 2017-03-17 DIAGNOSIS — Z8673 Personal history of transient ischemic attack (TIA), and cerebral infarction without residual deficits: Secondary | ICD-10-CM

## 2017-03-17 DIAGNOSIS — R2990 Unspecified symptoms and signs involving the nervous system: Secondary | ICD-10-CM

## 2017-03-17 DIAGNOSIS — R200 Anesthesia of skin: Secondary | ICD-10-CM | POA: Diagnosis not present

## 2017-03-17 LAB — HEMOGLOBIN A1C
Hgb A1c MFr Bld: 5.9 % — ABNORMAL HIGH (ref <5.7)
Mean Plasma Glucose: 123 mg/dL

## 2017-03-28 ENCOUNTER — Other Ambulatory Visit: Payer: Self-pay | Admitting: *Deleted

## 2017-03-29 ENCOUNTER — Other Ambulatory Visit: Payer: Self-pay | Admitting: *Deleted

## 2017-03-29 MED ORDER — ALBUTEROL SULFATE HFA 108 (90 BASE) MCG/ACT IN AERS: 2.0000 | INHALATION_SPRAY | Freq: Four times a day (QID) | RESPIRATORY_TRACT | 11 refills | Status: DC | PRN

## 2017-03-29 NOTE — Telephone Encounter (Signed)
Received call from patient.   States that she requires refill on Albuterol Inhaler from [facility] telephone/ [phone]~ fax.   Prescription faxed.

## 2017-03-31 ENCOUNTER — Encounter: Payer: Self-pay | Admitting: *Deleted

## 2017-04-27 ENCOUNTER — Ambulatory Visit: Payer: Medicare Other | Admitting: Family Medicine

## 2017-05-04 ENCOUNTER — Encounter: Payer: Self-pay | Admitting: Family Medicine

## 2017-05-26 ENCOUNTER — Other Ambulatory Visit: Payer: Self-pay | Admitting: Family Medicine

## 2017-05-26 MED ORDER — NITROGLYCERIN 0.4 MG SL SUBL: 0.4000 mg | SUBLINGUAL_TABLET | SUBLINGUAL | 1 refills | Status: DC | PRN

## 2017-10-04 DIAGNOSIS — M546 Pain in thoracic spine: Secondary | ICD-10-CM | POA: Diagnosis not present

## 2017-10-04 DIAGNOSIS — E119 Type 2 diabetes mellitus without complications: Secondary | ICD-10-CM | POA: Diagnosis not present

## 2017-10-04 DIAGNOSIS — M549 Dorsalgia, unspecified: Secondary | ICD-10-CM | POA: Diagnosis not present

## 2017-10-04 DIAGNOSIS — Z886 Allergy status to analgesic agent status: Secondary | ICD-10-CM | POA: Diagnosis not present

## 2017-10-04 DIAGNOSIS — Z5320 Procedure and treatment not carried out because of patient's decision for unspecified reasons: Secondary | ICD-10-CM | POA: Diagnosis not present

## 2017-10-04 DIAGNOSIS — R079 Chest pain, unspecified: Secondary | ICD-10-CM | POA: Diagnosis not present

## 2017-11-21 DIAGNOSIS — R079 Chest pain, unspecified: Secondary | ICD-10-CM | POA: Diagnosis not present

## 2017-11-21 DIAGNOSIS — M79601 Pain in right arm: Secondary | ICD-10-CM | POA: Diagnosis not present

## 2017-11-21 DIAGNOSIS — M791 Myalgia, unspecified site: Secondary | ICD-10-CM | POA: Diagnosis not present

## 2017-11-21 DIAGNOSIS — I10 Essential (primary) hypertension: Secondary | ICD-10-CM | POA: Diagnosis not present

## 2017-11-22 DIAGNOSIS — R200 Anesthesia of skin: Secondary | ICD-10-CM | POA: Diagnosis not present

## 2017-11-22 DIAGNOSIS — R739 Hyperglycemia, unspecified: Secondary | ICD-10-CM | POA: Diagnosis not present

## 2017-11-22 DIAGNOSIS — G894 Chronic pain syndrome: Secondary | ICD-10-CM | POA: Diagnosis not present

## 2018-02-14 DIAGNOSIS — G5602 Carpal tunnel syndrome, left upper limb: Secondary | ICD-10-CM | POA: Diagnosis not present

## 2018-02-14 DIAGNOSIS — I639 Cerebral infarction, unspecified: Secondary | ICD-10-CM | POA: Diagnosis not present

## 2018-02-20 DIAGNOSIS — G5602 Carpal tunnel syndrome, left upper limb: Secondary | ICD-10-CM | POA: Diagnosis not present

## 2018-03-16 DIAGNOSIS — G5602 Carpal tunnel syndrome, left upper limb: Secondary | ICD-10-CM | POA: Diagnosis not present

## 2018-03-16 DIAGNOSIS — M5412 Radiculopathy, cervical region: Secondary | ICD-10-CM | POA: Diagnosis not present

## 2018-03-16 DIAGNOSIS — I639 Cerebral infarction, unspecified: Secondary | ICD-10-CM | POA: Diagnosis not present

## 2018-03-22 DIAGNOSIS — M542 Cervicalgia: Secondary | ICD-10-CM | POA: Diagnosis not present

## 2018-03-22 DIAGNOSIS — M5412 Radiculopathy, cervical region: Secondary | ICD-10-CM | POA: Diagnosis not present

## 2018-06-18 IMAGING — MR MR HEAD W/O CM
10 series · 48 of 48 positions shown · non-contrast
Comparison: Brain MRI 05/04/2016

CLINICAL DATA: Left-sided hand and leg numbness.

EXAM:
MRI HEAD WITHOUT CONTRAST
TECHNIQUE: Multiplanar, multiecho pulse sequences of the brain and surrounding
structures were obtained without intravenous contrast.

[Series 2: t1_se_sag · sagittal · 5.0mm · 0.45mm/px · 3 of 21 slices shown]
[im 1/21]
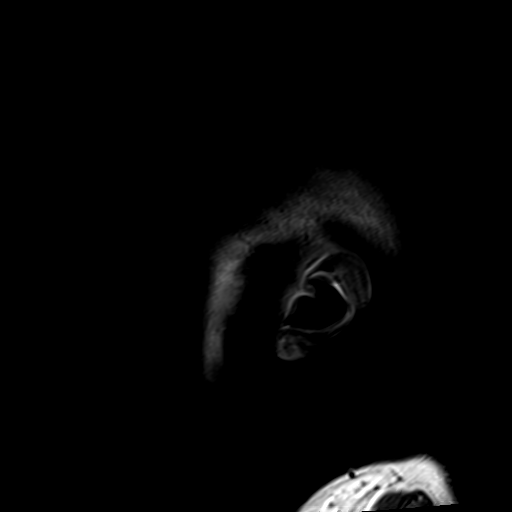
[im 11/21]
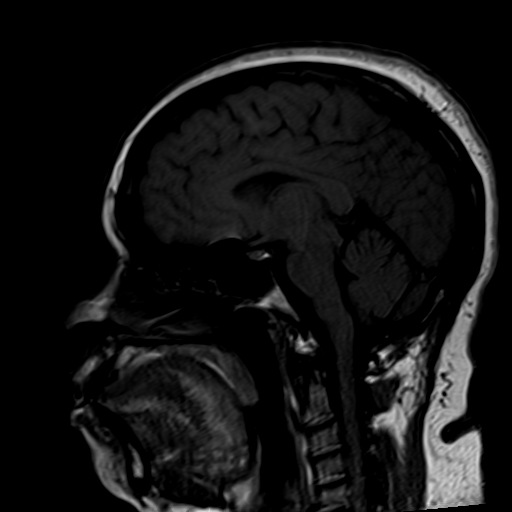
[im 21/21]
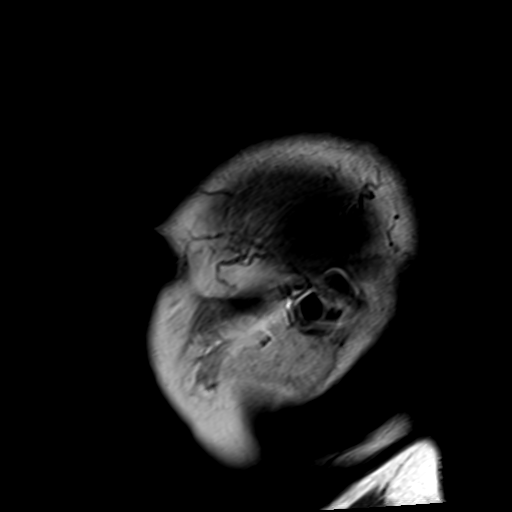

[Series 3: ep2d_diff_(id)_trace · axial · 3.0mm · 1.80mm/px · z∈[-41,+97]mm · 9 of 93 slices shown]
[im 1/93]
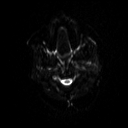
[im 12/93]
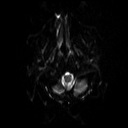
[im 24/93]
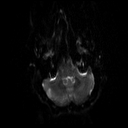
[im 35/93]
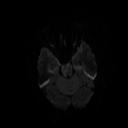
[im 47/93]
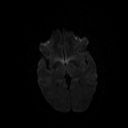
[im 58/93]
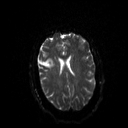
[im 70/93]
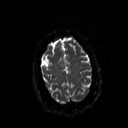
[im 81/93]
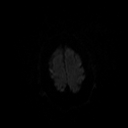
[im 93/93]
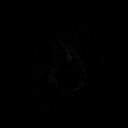

[Series 4: ep2d_diff_(id)_trace_adc · axial · 3.0mm · 1.80mm/px · z∈[-44,+97]mm · 4 of 48 slices shown]
[im 1/48]
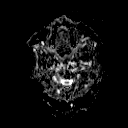
[im 16/48]
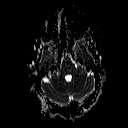
[im 32/48]
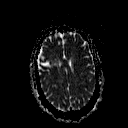
[im 48/48]
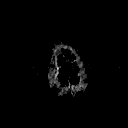

[Series 5: ep2d_diff_cor · coronal · 5.0mm · 1.77mm/px · 4 of 47 slices shown]
[im 1/47]
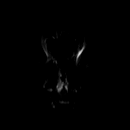
[im 16/47]
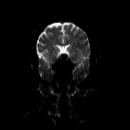
[im 31/47]
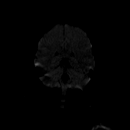
[im 47/47]
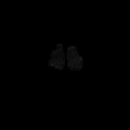

[Series 6: ep2d_diff_cor_adc · coronal · 5.0mm · 1.77mm/px · 2 of 24 slices shown]
[im 1/24]
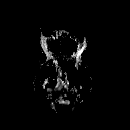
[im 24/24]
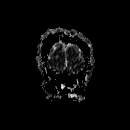

[Series 8: swi_images · axial · 2.0mm · 0.90mm/px · z∈[-31,+94]mm · 6 of 64 slices shown]
[im 1/64]
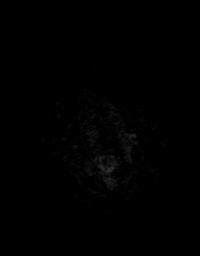
[im 13/64]
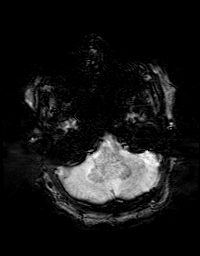
[im 26/64]
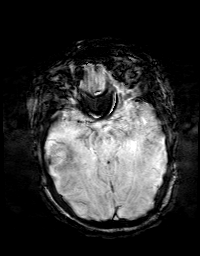
[im 38/64]
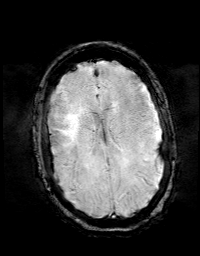
[im 51/64]
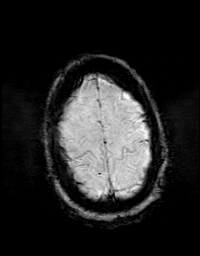
[im 64/64]
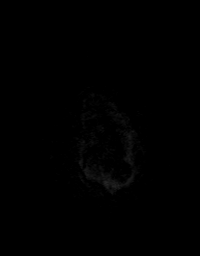

[Series 9: FLAIR · axial · 3.0mm · 0.43mm/px · z∈[-38,+94]mm · 2 of 23 slices shown]
[im 1/23]
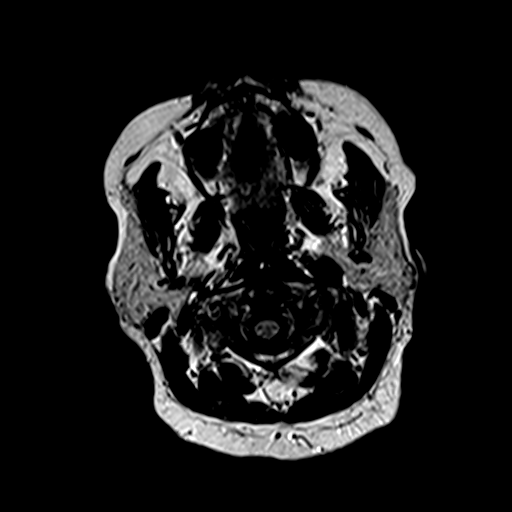
[im 23/23]
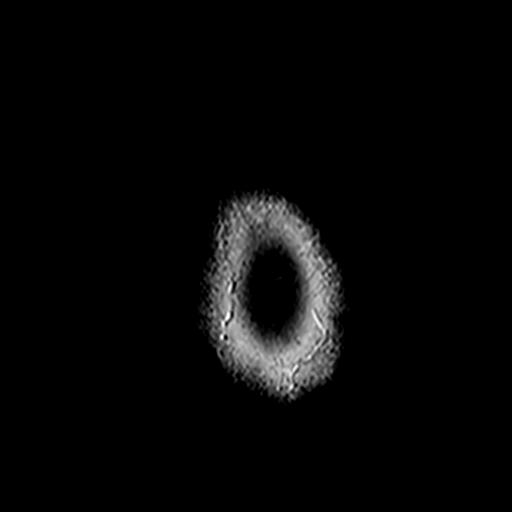

[Series 10: t2_tse_tra_512 · axial · 5.0mm · 0.60mm/px · z∈[-32,+94]mm · 2 of 22 slices shown]
[im 1/22]
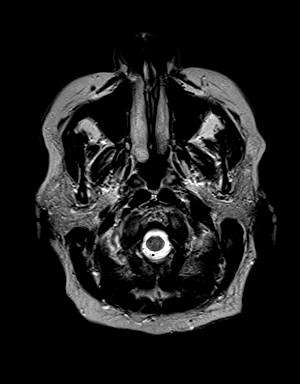
[im 22/22]
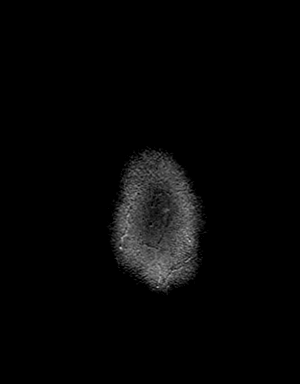

[Series 11: t1_mpr_tra · axial · 1.0mm · 0.72mm/px · z∈[-40,+103]mm · 14 of 144 slices shown]
[im 1/144]
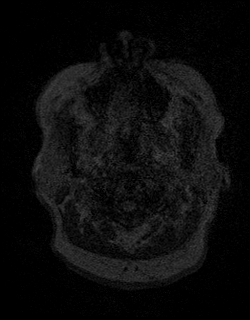
[im 12/144]
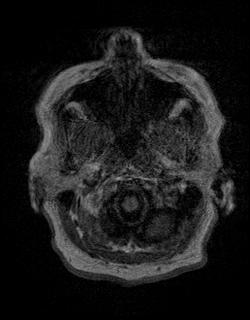
[im 23/144]
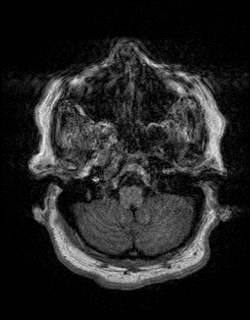
[im 34/144]
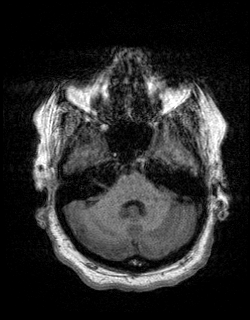
[im 45/144]
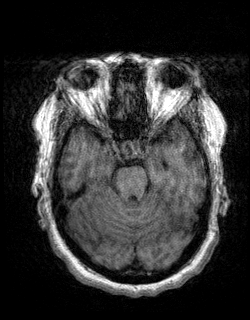
[im 56/144]
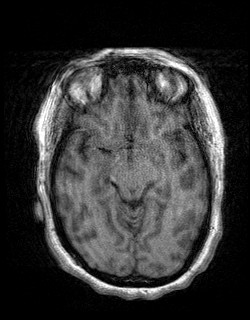
[im 67/144]
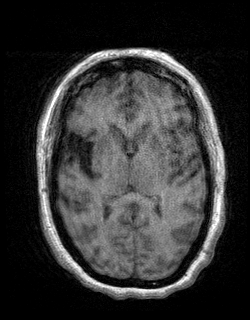
[im 78/144]
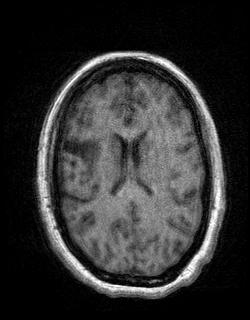
[im 89/144]
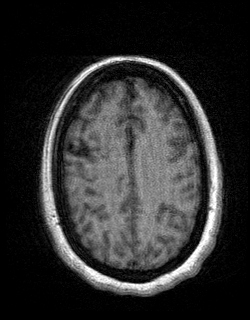
[im 100/144]
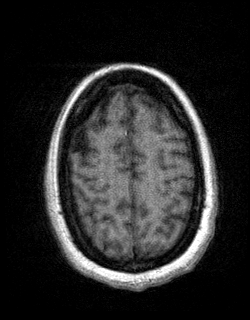
[im 111/144]
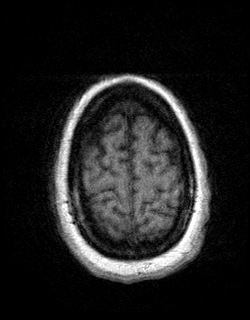
[im 122/144]
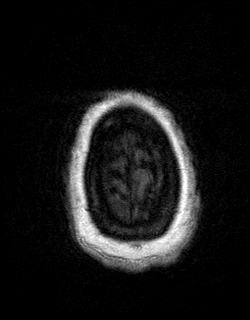
[im 133/144]
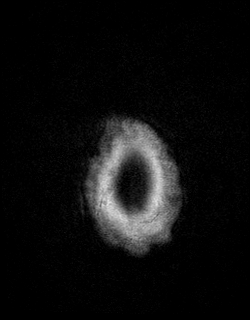
[im 144/144]
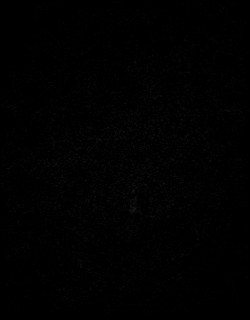

[Series 13: T2 · coronal · 5.0mm · 0.45mm/px · 2 of 26 slices shown]
[im 1/26]
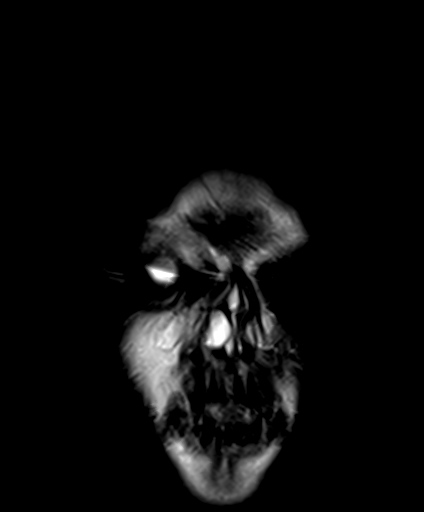
[im 26/26]
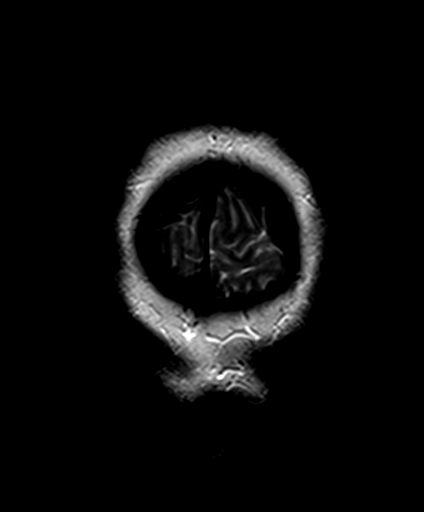

[48 of 48 positions shown; findings below may reference images not displayed]

FINDINGS: Brain: The midline structures are normal. There is no focal
diffusion restriction to indicate acute infarct. Encephalomalacia at
the site of remote right insular infarction, unchanged. No
intraparenchymal hematoma or chronic microhemorrhage. Brain volume
is normal for age without age-advanced or lobar predominant atrophy.
The dura is normal and there is no extra-axial collection.

Vascular: Major intracranial arterial and venous sinus flow voids
are preserved.

Skull and upper cervical spine: The visualized skull base,
calvarium, upper cervical spine and extracranial soft tissues are
normal.

Sinuses/Orbits: No fluid levels or advanced mucosal thickening. No
mastoid or middle ear effusion. Normal orbits.
IMPRESSION: Unchanged appearance of remote insular infarct and associated
encephalomalacia without intracranial abnormality.

## 2019-05-03 ENCOUNTER — Telehealth: Payer: Self-pay | Admitting: Obstetrics & Gynecology

## 2019-05-03 NOTE — Telephone Encounter (Signed)
Called patient regarding appointment and the following message was left:   We have you scheduled for an upcoming appointment at our office. At this time, patients are encouraged to come alone to their visits whenever possible, however, a support person, over age 55, may accompany you to your appointment if assistance is needed for safety or care concerns. Otherwise, support persons should remain outside until the visit is complete.   We ask if you have had any exposure to anyone suspected or confirmed of having COVID-19 or if you are experiencing any of the following, to call and reschedule your appointment: fever, cough, shortness of breath, muscle pain, diarrhea, rash, vomiting, abdominal pain, red eye, weakness, bruising, bleeding, joint pain, or a severe headache.   Please know we will ask you these questions or similar questions when you arrive for your appointment and again its how we are keeping everyone safe.    Also,to keep you safe, please use the provided hand sanitizer when you enter the office. We are asking everyone in the office to wear a mask to help prevent the spread of germs. If you have a mask of your own, please wear it to your appointment, if not, we are happy to provide one for you.  Thank you for understanding and your cooperation.    [facility] Staff

## 2019-05-04 ENCOUNTER — Other Ambulatory Visit: Payer: Self-pay

## 2019-05-04 ENCOUNTER — Other Ambulatory Visit (HOSPITAL_COMMUNITY)
Admission: RE | Admit: 2019-05-04 | Discharge: 2019-05-04 | Disposition: A | Discharge status: Home / Self Care | Payer: Medicare Other | Source: Ambulatory Visit | Attending: Obstetrics & Gynecology | Admitting: Obstetrics & Gynecology

## 2019-05-04 ENCOUNTER — Encounter: Payer: Self-pay | Admitting: Obstetrics & Gynecology

## 2019-05-04 ENCOUNTER — Ambulatory Visit (INDEPENDENT_AMBULATORY_CARE_PROVIDER_SITE_OTHER): Payer: Medicare Other | Admitting: Obstetrics & Gynecology

## 2019-05-04 VITALS — BP 153/95 | HR 80 | Ht 61.0 in | Wt 219.0 lb

## 2019-05-04 DIAGNOSIS — B9689 Other specified bacterial agents as the cause of diseases classified elsewhere: Secondary | ICD-10-CM | POA: Diagnosis not present

## 2019-05-04 DIAGNOSIS — N760 Acute vaginitis: Secondary | ICD-10-CM | POA: Diagnosis not present

## 2019-05-04 DIAGNOSIS — Z01419 Encounter for gynecological examination (general) (routine) without abnormal findings: Secondary | ICD-10-CM | POA: Diagnosis present

## 2019-05-04 MED ORDER — METRONIDAZOLE 0.75 % VA GEL: VAGINAL | 0 refills | Status: DC

## 2019-05-04 NOTE — Progress Notes (Signed)
       Chief Complaint  Patient presents with  . Vaginal Discharge    some blood in discharge    Blood pressure (!) 153/95, pulse 80, height 5\' 1"  (1.549 m).  53 y.o. [id number] No LMP recorded. Patient has had an ablation. The current method of family planning is tubal ligation.  Subjective Vaginal discharge for 2weeks Itching yes Irritation yes Odor yes Similar to previous no  Previous treatment n/a  Objective Vulva:  normal appearing vulva with no masses, tenderness or lesions Vagina:  normal mucosa, thin grey discharge Cervix:  no cervical motion tenderness and no lesions Uterus:  normal size, contour, position, consistency, mobility, non-tender Adnexa: ovaries:present,  normal adnexa in size, nontender and no masses     Pertinent ROS   Labs or studies Wet Prep:   A sample of vaginal discharge was obtained from the posterior fornix using a cotton swab. 2 drops of saline were placed on a slide and the cotton swab was immersed in the saline. Microscopic evaluation was performed and results were as follows:  Negative  for yeast  Positive for clue cells , consistent with Bacterial vaginosis Negative for trichomonas  Normal WBC population   Whiff test: Positive     Impression Diagnoses this Encounter::   ICD-10-CM   1. Bacterial vaginosis  N76.0    B96.89   2. Encounter for gynecological examination with Papanicolaou smear of cervix  Z01.419 Cytology - PAP( [facility])    Established relevant diagnosis(es):   Plan/Recommendations: Meds ordered this encounter  Medications  . metroNIDAZOLE (METROGEL VAGINAL) 0.75 % vaginal gel    Sig: Nightly x 5 nights    Dispense:  70 g    Refill:  0    Labs or Scans Ordered: No orders of the defined types were placed in this encounter.   Management:: >metro gel for BV  Follow up Return if symptoms worsen or fail to improve.         All questions were answered.

## 2019-05-09 LAB — CYTOLOGY - PAP
Chlamydia: NEGATIVE
Diagnosis: NEGATIVE
HPV: NOT DETECTED
Neisseria Gonorrhea: NEGATIVE

## 2019-05-14 ENCOUNTER — Telehealth: Payer: Self-pay | Admitting: Obstetrics & Gynecology

## 2019-05-14 MED ORDER — METRONIDAZOLE 500 MG PO TABS: 500.0000 mg | ORAL_TABLET | Freq: Two times a day (BID) | ORAL | 1 refills | Status: DC

## 2019-05-15 ENCOUNTER — Telehealth: Payer: Self-pay | Admitting: *Deleted

## 2019-05-15 NOTE — Telephone Encounter (Signed)
Left message on multiple numbers.

## 2019-05-21 ENCOUNTER — Telehealth: Payer: Self-pay | Admitting: *Deleted

## 2019-05-21 NOTE — Telephone Encounter (Signed)
LMOVM of multiple numbers listed.

## 2019-05-25 ENCOUNTER — Encounter: Payer: Self-pay | Admitting: *Deleted

## 2019-06-21 ENCOUNTER — Encounter: Payer: Self-pay | Admitting: Women's Health

## 2019-06-21 ENCOUNTER — Ambulatory Visit (INDEPENDENT_AMBULATORY_CARE_PROVIDER_SITE_OTHER): Payer: Medicare Other | Admitting: Women's Health

## 2019-06-21 ENCOUNTER — Other Ambulatory Visit: Payer: Self-pay

## 2019-06-21 VITALS — BP 149/85 | Ht 61.0 in

## 2019-06-21 DIAGNOSIS — N939 Abnormal uterine and vaginal bleeding, unspecified: Secondary | ICD-10-CM

## 2019-06-21 DIAGNOSIS — N898 Other specified noninflammatory disorders of vagina: Secondary | ICD-10-CM | POA: Diagnosis not present

## 2019-06-21 DIAGNOSIS — A599 Trichomoniasis, unspecified: Secondary | ICD-10-CM | POA: Diagnosis not present

## 2019-06-21 MED ORDER — METRONIDAZOLE 500 MG PO TABS: 500.0000 mg | ORAL_TABLET | Freq: Two times a day (BID) | ORAL | 0 refills | Status: DC

## 2019-06-21 NOTE — Patient Instructions (Signed)
Trichomoniasis Trichomoniasis is an STI (sexually transmitted infection) that can affect both women and men. In women, the outer area of the female genitalia (vulva) and the vagina are affected. In men, mainly the penis is affected, but the prostate and other reproductive organs can also be involved.  This condition can be treated with medicine. It often has no symptoms (is asymptomatic), especially in men. If not treated, trichomoniasis can last for months or years. What are the causes? This condition is caused by a parasite called Trichomonas vaginalis. Trichomoniasis most often spreads from person to person (is contagious) through sexual contact. What increases the risk? The following factors may make you more likely to develop this condition:  Having unprotected sex.  Having sex with a partner who has trichomoniasis.  Having multiple sexual partners.  Having had previous trichomoniasis infections or other STIs. What are the signs or symptoms? In women, symptoms of trichomoniasis include:  Abnormal vaginal discharge that is clear, white, gray, or yellow-green and foamy and has an unusual "fishy" odor.  Itching and irritation of the vagina and vulva.  Burning or pain during urination or sex.  Redness and swelling of the genitals. In men, symptoms of trichomoniasis include:  Penile discharge that may be foamy or contain pus.  Pain in the penis. This may happen only when urinating.  Itching or irritation inside the penis.  Burning after urination or ejaculation. How is this diagnosed? In women, this condition may be found during a routine Pap test or physical exam. It may be found in men during a routine physical exam. Your health care provider may do tests to help diagnose this infection, such as:  Urine tests (men and women).  The following in women: ? Testing the pH of the vagina. ? A vaginal swab test that checks for the Trichomonas vaginalis parasite. ? Testing vaginal  secretions. Your health care provider may test you for other STIs, including HIV (human immunodeficiency virus). How is this treated? This condition is treated with medicine taken by mouth (orally), such as metronidazole or tinidazole, to fight the infection. Your sexual partner(s) also need to be tested and treated.  If you are a woman and you plan to become pregnant or think you may be pregnant, tell your health care provider right away. Some medicines that are used to treat the infection should not be taken during pregnancy. Your health care provider may recommend over-the-counter medicines or creams to help relieve itching or irritation. You may be tested for infection again 3 months after treatment. Follow these instructions at home:  Take and use over-the-counter and prescription medicines, including creams, only as told by your health care provider.  Take your antibiotic medicine as told by your health care provider. Do not stop taking the antibiotic even if you start to feel better.  Do not have sex until 7-10 days after you finish your medicine, or until your health care provider approves. Ask your health care provider when you may start to have sex again.  (Women) Do not douche or wear tampons while you have the infection.  Discuss your infection with your sexual partner(s). Make sure that your partner gets tested and treated, if necessary.  Keep all follow-up visits as told by your health care provider. This is important. How is this prevented?   Use condoms every time you have sex. Using condoms correctly and consistently can help protect against STIs.  Avoid having multiple sexual partners.  Talk with your sexual partner about any   symptoms that either of you may have, as well as any history of STIs.  Get tested for STIs and STDs (sexually transmitted diseases) before you have sex. Ask your partner to do the same.  Do not have sexual contact if you have symptoms of  trichomoniasis or another STI. Contact a health care provider if:  You still have symptoms after you finish your medicine.  You develop pain in your abdomen.  You have pain when you urinate.  You have bleeding after sex.  You develop a rash.  You feel nauseous or you vomit.  You plan to become pregnant or think you may be pregnant. Summary  Trichomoniasis is an STI (sexually transmitted infection) that can affect both women and men.  This condition often has no symptoms (is asymptomatic), especially in men.  Without treatment, this condition can last for months or years.  You should not have sex until 7-10 days after you finish your medicine, or until your health care provider approves. Ask your health care provider when you may start to have sex again.  Discuss your infection with your sexual partner(s). Make sure that your partner gets tested and treated, if necessary. This information is not intended to replace advice given to you by your health care provider. Make sure you discuss any questions you have with your health care provider. Document Released: 02/02/2001 Document Revised: 05/23/2018 Document Reviewed: 05/23/2018 Elsevier Patient Education  2020 [employer]  

## 2019-06-21 NOTE — Progress Notes (Signed)
   GYN VISIT Patient name: Gwendolyn Woodward MRN [id number]  Date of birth: 1966/04/03 Chief Complaint:   Vaginal Discharge (spotting)  History of Present Illness:   Gwendolyn Woodward is a 53 y.o. G21P2002 African American female being seen today for report of spotting, discharge w/ odor and irritation x ~69mths. Had pap appt 9/11, tx for BV, pap came back neg but +trichomonas, office was unable to contact pt. GC/CT were also neg. Not currently sexually active, last was about 4-64mths ago when she was in [place], was a new partner, no longer w/ him. Has h/o endometrial ablation, BTL.      No LMP recorded. Patient has had an ablation. The current method of family planning is tubal ligation.  Last pap 05/04/19. Results were:  neg w/ -HRHPV Review of Systems:   Pertinent items are noted in HPI Denies fever/chills, dizziness, headaches, visual disturbances, fatigue, shortness of breath, chest pain, abdominal pain, vomiting, abnormal vaginal discharge/itching/odor/irritation, problems with periods, bowel movements, urination, or intercourse unless otherwise stated above.  Pertinent History Reviewed:  Reviewed past medical,surgical, social, obstetrical and family history.  Reviewed problem list, medications and allergies. Physical Assessment:   Vitals:   06/21/19 1600  BP: (!) 149/85  Height: 5\' 1"  (1.549 m)  Body mass index is 41.38 kg/m.       Physical Examination:   General appearance: alert, well appearing, and in no distress  Mental status: alert, oriented to person, place, and time  Skin: warm & dry   Cardiovascular: normal heart rate noted  Respiratory: normal respiratory effort, no distress  Abdomen: soft, non-tender   Pelvic: examination not indicated  Extremities: no edema   Chaperone: n/a    No results found for this or any previous visit (from the past 24 hour(s)).  Assessment & Plan:  1) Untreated trichomonas> rx metronidazole 500mg  BID x 7d, no sex/etoh while taking, f/u  4wks for POC in person w/ provider. If spotting doesn't resolve will do pelvic u/s  Meds:  Meds ordered this encounter  Medications  . metroNIDAZOLE (FLAGYL) 500 MG tablet    Sig: Take 1 tablet (500 mg total) by mouth 2 (two) times daily.    Dispense:  14 tablet    Refill:  0    Order Specific Question:   Supervising Provider    Answer:   Elonda Husky, LUTHER H [[id number]]    No orders of the defined types were placed in this encounter.   Return in about 4 weeks (around 07/19/2019) for F/U, in person, CNM or NP.  [place], [facility] 06/21/2019 4:40 PM

## 2019-06-28 ENCOUNTER — Telehealth: Payer: Self-pay | Admitting: *Deleted

## 2019-06-28 NOTE — Telephone Encounter (Signed)
Erroneous encounter

## 2019-07-17 ENCOUNTER — Telehealth: Payer: Self-pay | Admitting: Adult Health

## 2019-07-17 NOTE — Telephone Encounter (Signed)

## 2019-07-18 ENCOUNTER — Ambulatory Visit: Payer: Medicare Other | Admitting: Adult Health

## 2019-11-21 ENCOUNTER — Other Ambulatory Visit: Payer: Self-pay | Admitting: Family Medicine

## 2019-11-21 DIAGNOSIS — Z1231 Encounter for screening mammogram for malignant neoplasm of breast: Secondary | ICD-10-CM

## 2019-12-03 ENCOUNTER — Ambulatory Visit: Payer: Medicare Other

## 2019-12-06 ENCOUNTER — Other Ambulatory Visit: Payer: Self-pay | Admitting: Family Medicine

## 2019-12-06 ENCOUNTER — Ambulatory Visit
Admission: RE | Admit: 2019-12-06 | Discharge: 2019-12-06 | Disposition: A | Discharge status: Home / Self Care | Payer: Medicare Other | Source: Ambulatory Visit | Attending: Family Medicine | Admitting: Family Medicine

## 2019-12-06 ENCOUNTER — Other Ambulatory Visit: Payer: Self-pay

## 2019-12-06 DIAGNOSIS — M25561 Pain in right knee: Secondary | ICD-10-CM

## 2019-12-06 DIAGNOSIS — Z1231 Encounter for screening mammogram for malignant neoplasm of breast: Secondary | ICD-10-CM

## 2019-12-06 DIAGNOSIS — M25511 Pain in right shoulder: Secondary | ICD-10-CM

## 2019-12-06 DIAGNOSIS — M25361 Other instability, right knee: Secondary | ICD-10-CM

## 2020-04-19 ENCOUNTER — Emergency Department (HOSPITAL_COMMUNITY): Payer: Medicare Other

## 2020-04-19 ENCOUNTER — Emergency Department (HOSPITAL_COMMUNITY): Payer: Medicare Other | Admitting: Anesthesiology

## 2020-04-19 ENCOUNTER — Encounter (HOSPITAL_COMMUNITY)
Admission: EM | Disposition: A | Discharge status: Skilled Nursing Facility | Payer: Self-pay | Source: Home / Self Care | Attending: Orthopaedic Surgery

## 2020-04-19 ENCOUNTER — Inpatient Hospital Stay (HOSPITAL_COMMUNITY): Payer: Medicare Other

## 2020-04-19 ENCOUNTER — Other Ambulatory Visit: Payer: Self-pay

## 2020-04-19 ENCOUNTER — Inpatient Hospital Stay (HOSPITAL_COMMUNITY)
Admission: EM | Admit: 2020-04-19 | Discharge: 2020-04-25 | DRG: 492 | Disposition: A | Discharge status: Skilled Nursing Facility | Payer: Medicare Other | Source: Skilled Nursing Facility | Attending: Internal Medicine | Admitting: Internal Medicine

## 2020-04-19 DIAGNOSIS — G4730 Sleep apnea, unspecified: Secondary | ICD-10-CM | POA: Diagnosis present

## 2020-04-19 DIAGNOSIS — Z20822 Contact with and (suspected) exposure to covid-19: Secondary | ICD-10-CM | POA: Diagnosis present

## 2020-04-19 DIAGNOSIS — J9601 Acute respiratory failure with hypoxia: Secondary | ICD-10-CM | POA: Diagnosis present

## 2020-04-19 DIAGNOSIS — R079 Chest pain, unspecified: Secondary | ICD-10-CM | POA: Diagnosis present

## 2020-04-19 DIAGNOSIS — M1990 Unspecified osteoarthritis, unspecified site: Secondary | ICD-10-CM | POA: Diagnosis not present

## 2020-04-19 DIAGNOSIS — S82871A Displaced pilon fracture of right tibia, initial encounter for closed fracture: Secondary | ICD-10-CM | POA: Diagnosis not present

## 2020-04-19 DIAGNOSIS — D62 Acute posthemorrhagic anemia: Secondary | ICD-10-CM | POA: Diagnosis present

## 2020-04-19 DIAGNOSIS — Z9851 Tubal ligation status: Secondary | ICD-10-CM

## 2020-04-19 DIAGNOSIS — I69354 Hemiplegia and hemiparesis following cerebral infarction affecting left non-dominant side: Secondary | ICD-10-CM

## 2020-04-19 DIAGNOSIS — R233 Spontaneous ecchymoses: Secondary | ICD-10-CM | POA: Diagnosis not present

## 2020-04-19 DIAGNOSIS — J45909 Unspecified asthma, uncomplicated: Secondary | ICD-10-CM | POA: Diagnosis present

## 2020-04-19 DIAGNOSIS — I10 Essential (primary) hypertension: Secondary | ICD-10-CM | POA: Diagnosis present

## 2020-04-19 DIAGNOSIS — T1490XA Injury, unspecified, initial encounter: Secondary | ICD-10-CM

## 2020-04-19 DIAGNOSIS — N9489 Other specified conditions associated with female genital organs and menstrual cycle: Secondary | ICD-10-CM | POA: Diagnosis not present

## 2020-04-19 DIAGNOSIS — J189 Pneumonia, unspecified organism: Secondary | ICD-10-CM | POA: Diagnosis not present

## 2020-04-19 DIAGNOSIS — Z833 Family history of diabetes mellitus: Secondary | ICD-10-CM

## 2020-04-19 DIAGNOSIS — Y92410 Unspecified street and highway as the place of occurrence of the external cause: Secondary | ICD-10-CM | POA: Diagnosis not present

## 2020-04-19 DIAGNOSIS — E669 Obesity, unspecified: Secondary | ICD-10-CM | POA: Diagnosis not present

## 2020-04-19 DIAGNOSIS — K219 Gastro-esophageal reflux disease without esophagitis: Secondary | ICD-10-CM | POA: Diagnosis present

## 2020-04-19 DIAGNOSIS — R519 Headache, unspecified: Secondary | ICD-10-CM | POA: Diagnosis present

## 2020-04-19 DIAGNOSIS — Z825 Family history of asthma and other chronic lower respiratory diseases: Secondary | ICD-10-CM

## 2020-04-19 DIAGNOSIS — F419 Anxiety disorder, unspecified: Secondary | ICD-10-CM | POA: Diagnosis present

## 2020-04-19 DIAGNOSIS — E785 Hyperlipidemia, unspecified: Secondary | ICD-10-CM | POA: Diagnosis present

## 2020-04-19 DIAGNOSIS — E119 Type 2 diabetes mellitus without complications: Secondary | ICD-10-CM | POA: Diagnosis not present

## 2020-04-19 DIAGNOSIS — S82251A Displaced comminuted fracture of shaft of right tibia, initial encounter for closed fracture: Secondary | ICD-10-CM | POA: Diagnosis not present

## 2020-04-19 DIAGNOSIS — Z590 Homelessness: Secondary | ICD-10-CM

## 2020-04-19 DIAGNOSIS — S82401A Unspecified fracture of shaft of right fibula, initial encounter for closed fracture: Secondary | ICD-10-CM

## 2020-04-19 DIAGNOSIS — M545 Low back pain: Secondary | ICD-10-CM | POA: Diagnosis present

## 2020-04-19 DIAGNOSIS — Z794 Long term (current) use of insulin: Secondary | ICD-10-CM

## 2020-04-19 DIAGNOSIS — F329 Major depressive disorder, single episode, unspecified: Secondary | ICD-10-CM | POA: Diagnosis present

## 2020-04-19 DIAGNOSIS — Z79899 Other long term (current) drug therapy: Secondary | ICD-10-CM

## 2020-04-19 DIAGNOSIS — N179 Acute kidney failure, unspecified: Secondary | ICD-10-CM | POA: Diagnosis not present

## 2020-04-19 DIAGNOSIS — Z6841 Body Mass Index (BMI) 40.0 and over, adult: Secondary | ICD-10-CM

## 2020-04-19 DIAGNOSIS — Z8249 Family history of ischemic heart disease and other diseases of the circulatory system: Secondary | ICD-10-CM

## 2020-04-19 DIAGNOSIS — R52 Pain, unspecified: Secondary | ICD-10-CM

## 2020-04-19 DIAGNOSIS — E1169 Type 2 diabetes mellitus with other specified complication: Secondary | ICD-10-CM | POA: Diagnosis not present

## 2020-04-19 DIAGNOSIS — N390 Urinary tract infection, site not specified: Secondary | ICD-10-CM | POA: Diagnosis not present

## 2020-04-19 DIAGNOSIS — R0602 Shortness of breath: Secondary | ICD-10-CM

## 2020-04-19 DIAGNOSIS — S82874A Nondisplaced pilon fracture of right tibia, initial encounter for closed fracture: Secondary | ICD-10-CM | POA: Diagnosis not present

## 2020-04-19 DIAGNOSIS — Z801 Family history of malignant neoplasm of trachea, bronchus and lung: Secondary | ICD-10-CM

## 2020-04-19 DIAGNOSIS — Z886 Allergy status to analgesic agent status: Secondary | ICD-10-CM

## 2020-04-19 DIAGNOSIS — E279 Disorder of adrenal gland, unspecified: Secondary | ICD-10-CM | POA: Diagnosis present

## 2020-04-19 DIAGNOSIS — T148XXA Other injury of unspecified body region, initial encounter: Secondary | ICD-10-CM

## 2020-04-19 DIAGNOSIS — S301XXA Contusion of abdominal wall, initial encounter: Secondary | ICD-10-CM | POA: Diagnosis present

## 2020-04-19 DIAGNOSIS — N6489 Other specified disorders of breast: Secondary | ICD-10-CM

## 2020-04-19 DIAGNOSIS — S82301A Unspecified fracture of lower end of right tibia, initial encounter for closed fracture: Secondary | ICD-10-CM

## 2020-04-19 DIAGNOSIS — Z419 Encounter for procedure for purposes other than remedying health state, unspecified: Secondary | ICD-10-CM

## 2020-04-19 DIAGNOSIS — S2001XA Contusion of right breast, initial encounter: Secondary | ICD-10-CM | POA: Diagnosis present

## 2020-04-19 HISTORY — PX: EXTERNAL FIXATION LEG: SHX1549

## 2020-04-19 LAB — SARS CORONAVIRUS 2 BY RT PCR (HOSPITAL ORDER, PERFORMED IN ~~LOC~~ HOSPITAL LAB): SARS Coronavirus 2: NEGATIVE

## 2020-04-19 LAB — I-STAT CHEM 8, ED
BUN: 22 mg/dL — ABNORMAL HIGH (ref 6–20)
Calcium, Ion: 1.17 mmol/L (ref 1.15–1.40)
Chloride: 109 mmol/L (ref 98–111)
Creatinine, Ser: 1 mg/dL (ref 0.44–1.00)
Glucose, Bld: 153 mg/dL — ABNORMAL HIGH (ref 70–99)
HCT: 35 % — ABNORMAL LOW (ref 36.0–46.0)
Hemoglobin: 11.9 g/dL — ABNORMAL LOW (ref 12.0–15.0)
Potassium: 3.8 mmol/L (ref 3.5–5.1)
Sodium: 143 mmol/L (ref 135–145)
TCO2: 24 mmol/L (ref 22–32)

## 2020-04-19 LAB — COMPREHENSIVE METABOLIC PANEL
ALT: 15 U/L (ref 0–44)
AST: 16 U/L (ref 15–41)
Albumin: 3.1 g/dL — ABNORMAL LOW (ref 3.5–5.0)
Alkaline Phosphatase: 95 U/L (ref 38–126)
Anion gap: 11 (ref 5–15)
BUN: 21 mg/dL — ABNORMAL HIGH (ref 6–20)
CO2: 22 mmol/L (ref 22–32)
Calcium: 9.1 mg/dL (ref 8.9–10.3)
Chloride: 109 mmol/L (ref 98–111)
Creatinine, Ser: 1.1 mg/dL — ABNORMAL HIGH (ref 0.44–1.00)
GFR calc Af Amer: >60 mL/min (ref >60)
GFR calc non Af Amer: 56 mL/min — ABNORMAL LOW (ref >60)
Glucose, Bld: 161 mg/dL — ABNORMAL HIGH (ref 70–99)
Potassium: 3.8 mmol/L (ref 3.5–5.1)
Sodium: 142 mmol/L (ref 135–145)
Total Bilirubin: 0.3 mg/dL (ref 0.3–1.2)
Total Protein: 6.3 g/dL — ABNORMAL LOW (ref 6.5–8.1)

## 2020-04-19 LAB — URINALYSIS, ROUTINE W REFLEX MICROSCOPIC
Bilirubin Urine: NEGATIVE
Glucose, UA: NEGATIVE mg/dL
Hgb urine dipstick: NEGATIVE
Ketones, ur: 5 mg/dL — AB
Leukocytes,Ua: NEGATIVE
Nitrite: NEGATIVE
Protein, ur: NEGATIVE mg/dL
Specific Gravity, Urine: >1.046 — ABNORMAL HIGH (ref 1.005–1.030)
pH: 5 (ref 5.0–8.0)

## 2020-04-19 LAB — CBC WITH DIFFERENTIAL/PLATELET
Abs Immature Granulocytes: 0.04 10*3/uL (ref 0.00–0.07)
Basophils Absolute: 0 10*3/uL (ref 0.0–0.1)
Basophils Relative: 0 %
Eosinophils Absolute: 0.1 10*3/uL (ref 0.0–0.5)
Eosinophils Relative: 1 %
HCT: 36.8 % (ref 36.0–46.0)
Hemoglobin: 11.2 g/dL — ABNORMAL LOW (ref 12.0–15.0)
Immature Granulocytes: 0 %
Lymphocytes Relative: 29 %
Lymphs Abs: 2.9 10*3/uL (ref 0.7–4.0)
MCH: 25.1 pg — ABNORMAL LOW (ref 26.0–34.0)
MCHC: 30.4 g/dL (ref 30.0–36.0)
MCV: 82.5 fL (ref 80.0–100.0)
Monocytes Absolute: 0.4 10*3/uL (ref 0.1–1.0)
Monocytes Relative: 4 %
Neutro Abs: 6.6 10*3/uL (ref 1.7–7.7)
Neutrophils Relative %: 66 %
Platelets: 156 10*3/uL (ref 150–400)
RBC: 4.46 MIL/uL (ref 3.87–5.11)
RDW: 15.2 % (ref 11.5–15.5)
WBC: 10.2 10*3/uL (ref 4.0–10.5)
nRBC: 0 % (ref 0.0–0.2)

## 2020-04-19 LAB — GLUCOSE, CAPILLARY
Glucose-Capillary: 151 mg/dL — ABNORMAL HIGH (ref 70–99)
Glucose-Capillary: 145 mg/dL — ABNORMAL HIGH (ref 70–99)
Glucose-Capillary: 187 mg/dL — ABNORMAL HIGH (ref 70–99)

## 2020-04-19 LAB — PROTIME-INR
INR: 1 (ref 0.8–1.2)
Prothrombin Time: 12.6 seconds (ref 11.4–15.2)

## 2020-04-19 LAB — TROPONIN I (HIGH SENSITIVITY)
Troponin I (High Sensitivity): 4 ng/L (ref <18)
Troponin I (High Sensitivity): 5 ng/L (ref <18)

## 2020-04-19 LAB — HEMOGLOBIN A1C
Hgb A1c MFr Bld: 6.3 % — ABNORMAL HIGH (ref 4.8–5.6)
Mean Plasma Glucose: 134.11 mg/dL

## 2020-04-19 SURGERY — EXTERNAL FIXATION, LOWER EXTREMITY
Anesthesia: General | Laterality: Right

## 2020-04-19 MED ORDER — ACETAMINOPHEN 10 MG/ML IV SOLN
INTRAVENOUS | Status: AC
  Filled 2020-04-19: qty 100

## 2020-04-19 MED ORDER — FENTANYL CITRATE (PF) 100 MCG/2ML IJ SOLN
50.0000 ug | Freq: Once | INTRAMUSCULAR | Status: AC
  Administered 2020-04-19: 50 ug via INTRAVENOUS
  Filled 2020-04-19: qty 2

## 2020-04-19 MED ORDER — FLUOXETINE HCL 20 MG PO CAPS
40.0000 mg | ORAL_CAPSULE | Freq: Every day | ORAL | Status: DC
  Administered 2020-04-20 – 2020-04-25 (×5): 40 mg via ORAL
  Filled 2020-04-19 (×5): qty 2

## 2020-04-19 MED ORDER — ONDANSETRON HCL 4 MG/2ML IJ SOLN
4.0000 mg | Freq: Four times a day (QID) | INTRAMUSCULAR | Status: DC | PRN
  Administered 2020-04-21: 4 mg via INTRAVENOUS
  Filled 2020-04-19: qty 2

## 2020-04-19 MED ORDER — FENTANYL CITRATE (PF) 250 MCG/5ML IJ SOLN
INTRAMUSCULAR | Status: AC
  Filled 2020-04-19: qty 5

## 2020-04-19 MED ORDER — HYDROMORPHONE HCL 1 MG/ML IJ SOLN
1.0000 mg | Freq: Once | INTRAMUSCULAR | Status: AC
  Administered 2020-04-19: 1 mg via INTRAVENOUS
  Filled 2020-04-19: qty 1

## 2020-04-19 MED ORDER — FENTANYL CITRATE (PF) 100 MCG/2ML IJ SOLN
INTRAMUSCULAR | Status: AC
  Filled 2020-04-19: qty 2

## 2020-04-19 MED ORDER — GABAPENTIN 100 MG PO CAPS
100.0000 mg | ORAL_CAPSULE | Freq: Three times a day (TID) | ORAL | Status: DC
  Administered 2020-04-19 – 2020-04-20 (×4): 100 mg via ORAL
  Filled 2020-04-19 (×4): qty 1

## 2020-04-19 MED ORDER — FENTANYL CITRATE (PF) 100 MCG/2ML IJ SOLN
INTRAMUSCULAR | Status: AC | PRN
  Administered 2020-04-19: 50 ug via INTRAVENOUS

## 2020-04-19 MED ORDER — METOCLOPRAMIDE HCL 5 MG/ML IJ SOLN: 5.0000 mg | Freq: Three times a day (TID) | INTRAMUSCULAR | Status: DC | PRN

## 2020-04-19 MED ORDER — HYDROMORPHONE HCL 1 MG/ML IJ SOLN
0.2500 mg | INTRAMUSCULAR | Status: AC | PRN
  Administered 2020-04-19 (×4): 0.5 mg via INTRAVENOUS

## 2020-04-19 MED ORDER — ONDANSETRON HCL 4 MG/2ML IJ SOLN
INTRAMUSCULAR | Status: DC | PRN
  Administered 2020-04-19: 4 mg via INTRAVENOUS

## 2020-04-19 MED ORDER — ACETAMINOPHEN 10 MG/ML IV SOLN
1000.0000 mg | Freq: Once | INTRAVENOUS | Status: AC
  Administered 2020-04-19: 1000 mg via INTRAVENOUS

## 2020-04-19 MED ORDER — METOCLOPRAMIDE HCL 5 MG PO TABS: 5.0000 mg | ORAL_TABLET | Freq: Three times a day (TID) | ORAL | Status: DC | PRN

## 2020-04-19 MED ORDER — CEFAZOLIN SODIUM-DEXTROSE 2-4 GM/100ML-% IV SOLN
2.0000 g | Freq: Four times a day (QID) | INTRAVENOUS | Status: AC
  Administered 2020-04-20 (×2): 2 g via INTRAVENOUS
  Filled 2020-04-19 (×3): qty 100

## 2020-04-19 MED ORDER — ONDANSETRON HCL 4 MG PO TABS: 4.0000 mg | ORAL_TABLET | Freq: Four times a day (QID) | ORAL | Status: DC | PRN

## 2020-04-19 MED ORDER — PANTOPRAZOLE SODIUM 40 MG PO TBEC
40.0000 mg | DELAYED_RELEASE_TABLET | Freq: Every day | ORAL | Status: DC
  Administered 2020-04-19 – 2020-04-25 (×6): 40 mg via ORAL
  Filled 2020-04-19 (×6): qty 1

## 2020-04-19 MED ORDER — SODIUM CHLORIDE 0.9 % IV SOLN: INTRAVENOUS | Status: DC

## 2020-04-19 MED ORDER — OXYCODONE HCL 5 MG PO TABS
10.0000 mg | ORAL_TABLET | ORAL | Status: DC | PRN
  Administered 2020-04-20 (×2): 10 mg via ORAL
  Administered 2020-04-24: 15 mg via ORAL
  Filled 2020-04-19: qty 3

## 2020-04-19 MED ORDER — METHOCARBAMOL 1000 MG/10ML IJ SOLN
500.0000 mg | Freq: Four times a day (QID) | INTRAVENOUS | Status: DC | PRN
  Filled 2020-04-19: qty 5

## 2020-04-19 MED ORDER — DOCUSATE SODIUM 100 MG PO CAPS
100.0000 mg | ORAL_CAPSULE | Freq: Two times a day (BID) | ORAL | Status: DC
  Administered 2020-04-19 – 2020-04-25 (×11): 100 mg via ORAL
  Filled 2020-04-19 (×11): qty 1

## 2020-04-19 MED ORDER — OXYCODONE HCL 5 MG PO TABS
5.0000 mg | ORAL_TABLET | ORAL | Status: DC | PRN
  Administered 2020-04-19: 5 mg via ORAL
  Administered 2020-04-20 – 2020-04-25 (×10): 10 mg via ORAL
  Filled 2020-04-19 (×14): qty 2
  Filled 2020-04-19: qty 1

## 2020-04-19 MED ORDER — IOHEXOL 300 MG/ML  SOLN
100.0000 mL | Freq: Once | INTRAMUSCULAR | Status: AC | PRN
  Administered 2020-04-19: 100 mL via INTRAVENOUS

## 2020-04-19 MED ORDER — HYDROMORPHONE HCL 1 MG/ML IJ SOLN
INTRAMUSCULAR | Status: AC
Start: 2020-04-19 — End: 2020-04-20
  Filled 2020-04-19: qty 1

## 2020-04-19 MED ORDER — ENOXAPARIN SODIUM 40 MG/0.4ML ~~LOC~~ SOLN
40.0000 mg | SUBCUTANEOUS | Status: DC
  Administered 2020-04-20 – 2020-04-21 (×2): 40 mg via SUBCUTANEOUS
  Filled 2020-04-19 (×2): qty 0.4

## 2020-04-19 MED ORDER — PROPOFOL 10 MG/ML IV BOLUS
INTRAVENOUS | Status: DC | PRN
  Administered 2020-04-19: 140 mg via INTRAVENOUS

## 2020-04-19 MED ORDER — METHOCARBAMOL 500 MG PO TABS
500.0000 mg | ORAL_TABLET | Freq: Four times a day (QID) | ORAL | Status: DC | PRN
  Administered 2020-04-20 – 2020-04-24 (×8): 500 mg via ORAL
  Filled 2020-04-19 (×8): qty 1

## 2020-04-19 MED ORDER — MIDAZOLAM HCL 2 MG/2ML IJ SOLN
INTRAMUSCULAR | Status: AC
  Filled 2020-04-19: qty 2

## 2020-04-19 MED ORDER — INSULIN ASPART 100 UNIT/ML ~~LOC~~ SOLN
0.0000 [IU] | Freq: Three times a day (TID) | SUBCUTANEOUS | Status: DC
  Administered 2020-04-20: 3 [IU] via SUBCUTANEOUS
  Administered 2020-04-20: 4 [IU] via SUBCUTANEOUS
  Administered 2020-04-21: 3 [IU] via SUBCUTANEOUS

## 2020-04-19 MED ORDER — MIDAZOLAM HCL 5 MG/5ML IJ SOLN
INTRAMUSCULAR | Status: DC | PRN
  Administered 2020-04-19: 1 mg via INTRAVENOUS

## 2020-04-19 MED ORDER — ACETAMINOPHEN 325 MG PO TABS
325.0000 mg | ORAL_TABLET | Freq: Four times a day (QID) | ORAL | Status: DC | PRN
  Administered 2020-04-21: 650 mg via ORAL
  Filled 2020-04-19: qty 2

## 2020-04-19 MED ORDER — FENTANYL CITRATE (PF) 100 MCG/2ML IJ SOLN: 25.0000 ug | INTRAMUSCULAR | Status: DC | PRN

## 2020-04-19 MED ORDER — PHENYLEPHRINE HCL-NACL 10-0.9 MG/250ML-% IV SOLN
INTRAVENOUS | Status: DC | PRN
  Administered 2020-04-19: 75 ug/min via INTRAVENOUS

## 2020-04-19 MED ORDER — HYDROMORPHONE HCL 1 MG/ML IJ SOLN
0.5000 mg | INTRAMUSCULAR | Status: DC | PRN
  Administered 2020-04-20 – 2020-04-24 (×3): 1 mg via INTRAVENOUS
  Filled 2020-04-19 (×3): qty 1

## 2020-04-19 MED ORDER — LIDOCAINE 2% (20 MG/ML) 5 ML SYRINGE
INTRAMUSCULAR | Status: DC | PRN
  Administered 2020-04-19: 80 mg via INTRAVENOUS

## 2020-04-19 MED ORDER — DIPHENHYDRAMINE HCL 12.5 MG/5ML PO ELIX: 12.5000 mg | ORAL_SOLUTION | ORAL | Status: DC | PRN

## 2020-04-19 MED ORDER — AMISULPRIDE (ANTIEMETIC) 5 MG/2ML IV SOLN: 10.0000 mg | Freq: Once | INTRAVENOUS | Status: DC | PRN

## 2020-04-19 MED ORDER — DEXAMETHASONE SODIUM PHOSPHATE 10 MG/ML IJ SOLN
INTRAMUSCULAR | Status: DC | PRN
  Administered 2020-04-19: 5 mg via INTRAVENOUS

## 2020-04-19 MED ORDER — SUCCINYLCHOLINE CHLORIDE 20 MG/ML IJ SOLN
INTRAMUSCULAR | Status: DC | PRN
  Administered 2020-04-19: 120 mg via INTRAVENOUS

## 2020-04-19 MED ORDER — CEFAZOLIN SODIUM-DEXTROSE 2-4 GM/100ML-% IV SOLN
2.0000 g | Freq: Once | INTRAVENOUS | Status: AC
  Administered 2020-04-19: 2 g via INTRAVENOUS

## 2020-04-19 MED ORDER — FENTANYL CITRATE (PF) 100 MCG/2ML IJ SOLN
INTRAMUSCULAR | Status: DC | PRN
  Administered 2020-04-19 (×3): 50 ug via INTRAVENOUS

## 2020-04-19 MED ORDER — LACTATED RINGERS IV SOLN: INTRAVENOUS | Status: DC | PRN

## 2020-04-19 MED ORDER — INSULIN ASPART 100 UNIT/ML ~~LOC~~ SOLN: 0.0000 [IU] | Freq: Every day | SUBCUTANEOUS | Status: DC

## 2020-04-19 MED ORDER — CEFAZOLIN SODIUM-DEXTROSE 2-4 GM/100ML-% IV SOLN
INTRAVENOUS | Status: AC
  Administered 2020-04-19: 2 g via INTRAVENOUS
  Filled 2020-04-19: qty 100

## 2020-04-19 SURGICAL SUPPLY — 41 items
BAR EXFX 400X11 NS LF (EXFIX) ×2
BAR GLASS FIBER EXFX 11X400 (EXFIX) ×2 IMPLANT
BINDER BREAST XXLRG (GAUZE/BANDAGES/DRESSINGS) ×1 IMPLANT
BNDG ELASTIC 4X5.8 VLCR STR LF (GAUZE/BANDAGES/DRESSINGS) ×1 IMPLANT
BNDG ELASTIC 6X5.8 VLCR STR LF (GAUZE/BANDAGES/DRESSINGS) ×1 IMPLANT
CAP PROTECTIVE TRANSFX 4.5X5MM (EXFIX) ×1 IMPLANT
CLAMP BLUE BAR TO PIN (EXFIX) ×2 IMPLANT
COVER MAYO STAND STRL (DRAPES) ×1 IMPLANT
COVER SURGICAL LIGHT HANDLE (MISCELLANEOUS) ×2 IMPLANT
DRAPE C-ARM 42X72 X-RAY (DRAPES) ×2 IMPLANT
DRAPE ORTHO SPLIT 77X108 STRL (DRAPES) ×4
DRAPE SURG ORHT 6 SPLT 77X108 (DRAPES) ×2 IMPLANT
DRAPE U-SHAPE 47X51 STRL (DRAPES) ×2 IMPLANT
DRSG PAD ABDOMINAL 8X10 ST (GAUZE/BANDAGES/DRESSINGS) ×1 IMPLANT
DURAPREP 26ML APPLICATOR (WOUND CARE) ×2 IMPLANT
ELECT REM PT RETURN 9FT ADLT (ELECTROSURGICAL) ×2
ELECTRODE REM PT RTRN 9FT ADLT (ELECTROSURGICAL) IMPLANT
GAUZE SPONGE 4X4 12PLY STRL (GAUZE/BANDAGES/DRESSINGS) ×2 IMPLANT
GAUZE XEROFORM 5X9 LF (GAUZE/BANDAGES/DRESSINGS) ×2 IMPLANT
GLOVE BIO SURGEON STRL SZ8 (GLOVE) ×2 IMPLANT
GLOVE ORTHO TXT STRL SZ7.5 (GLOVE) ×2 IMPLANT
GOWN STRL REUS W/ TWL LRG LVL3 (GOWN DISPOSABLE) ×1 IMPLANT
GOWN STRL REUS W/ TWL XL LVL3 (GOWN DISPOSABLE) ×4 IMPLANT
GOWN STRL REUS W/TWL LRG LVL3 (GOWN DISPOSABLE) ×2
GOWN STRL REUS W/TWL XL LVL3 (GOWN DISPOSABLE) ×8
HALF PIN 5.0X160 (EXFIX) ×2 IMPLANT
KIT BASIN OR (CUSTOM PROCEDURE TRAY) ×2 IMPLANT
KIT TURNOVER KIT B (KITS) ×2 IMPLANT
MANIFOLD NEPTUNE II (INSTRUMENTS) ×2 IMPLANT
NS IRRIG 1000ML POUR BTL (IV SOLUTION) ×2 IMPLANT
PACK ORTHO EXTREMITY (CUSTOM PROCEDURE TRAY) ×2 IMPLANT
PIN CLAMP 2BAR 75MM BLUE (EXFIX) ×2 IMPLANT
PIN TRANSFIXING 5.0 (EXFIX) ×2 IMPLANT
SPONGE LAP 18X18 RF (DISPOSABLE) ×2 IMPLANT
SUT ETHILON 2 0 FS 18 (SUTURE) IMPLANT
SUT VIC AB 2-0 CT1 27 (SUTURE)
SUT VIC AB 2-0 CT1 TAPERPNT 27 (SUTURE) IMPLANT
TOWEL GREEN STERILE (TOWEL DISPOSABLE) ×2 IMPLANT
TOWEL GREEN STERILE FF (TOWEL DISPOSABLE) ×2 IMPLANT
UNDERPAD 30X36 HEAVY ABSORB (UNDERPADS AND DIAPERS) ×2 IMPLANT
WATER STERILE IRR 1000ML POUR (IV SOLUTION) ×2 IMPLANT

## 2020-04-19 NOTE — Transfer of Care (Signed)
Immediate Anesthesia Transfer of Care Note  Patient: ORLA ESTRIN  Procedure(s) Performed: EXTERNAL FIXATION RIGHT ANKLE (Right )  Patient Location: PACU  Anesthesia Type:General  Level of Consciousness: drowsy and responds to stimulation  Airway & Oxygen Therapy: Patient Spontanous Breathing and Patient connected to face mask oxygen  Post-op Assessment: Report given to RN and Post -op Vital signs reviewed and stable  Post vital signs: Reviewed and stable  Last Vitals:  Vitals Value Taken Time  BP 163/90 04/19/20 1836  Temp    Pulse 86 04/19/20 1843  Resp 12 04/19/20 1843  SpO2 98 % 04/19/20 1843  Vitals shown include unvalidated device data.  Last Pain:  Vitals:   04/19/20 1422  TempSrc:   PainSc: 10-Worst pain ever         Complications: No complications documented.

## 2020-04-19 NOTE — Anesthesia Procedure Notes (Signed)
Procedure Name: Intubation Date/Time: 04/19/2020 5:41 PM Performed by: Suzy Bouchard, CRNA Pre-anesthesia Checklist: Patient identified, Emergency Drugs available, Suction available, Patient being monitored and Timeout performed Patient Re-evaluated:Patient Re-evaluated prior to induction Oxygen Delivery Method: Circle system utilized Induction Type: IV induction and Rapid sequence Laryngoscope Size: Miller and 2 Grade View: Grade II Tube type: Oral Tube size: 7.5 mm Number of attempts: 1 Airway Equipment and Method: Stylet Placement Confirmation: ETT inserted through vocal cords under direct vision,  positive ETCO2 and breath sounds checked- equal and bilateral Secured at: 23 cm Tube secured with: Tape Dental Injury: Teeth and Oropharynx as per pre-operative assessment

## 2020-04-19 NOTE — Anesthesia Preprocedure Evaluation (Addendum)
Anesthesia Evaluation  Patient identified by MRN, date of birth, ID band Patient awake    Reviewed: Allergy & Precautions, NPO status , Patient's Chart, lab work & pertinent test results  Airway Mallampati: II  TM Distance: >3 FB Neck ROM: Full    Dental  (+) Dental Advisory Given, Poor Dentition   Pulmonary asthma , sleep apnea ,    breath sounds clear to auscultation       Cardiovascular hypertension,  Rhythm:Regular Rate:Normal     Neuro/Psych  Headaches, CVA    GI/Hepatic Neg liver ROS, GERD  ,  Endo/Other  diabetesMorbid obesity  Renal/GU negative Renal ROS     Musculoskeletal  (+) Arthritis ,   Abdominal   Peds  Hematology  (+) anemia ,   Anesthesia Other Findings [facility]  Meds and CPAP are packed up and patient has not been taking for "months".  Reproductive/Obstetrics                           Lab Results  Component Value Date   WBC 10.2 04/19/2020   HGB 11.9 (L) 04/19/2020   HCT 35.0 (L) 04/19/2020   MCV 82.5 04/19/2020   PLT 156 04/19/2020   Lab Results  Component Value Date   CREATININE 1.00 04/19/2020   BUN 22 (H) 04/19/2020   NA 143 04/19/2020   K 3.8 04/19/2020   CL 109 04/19/2020   CO2 22 04/19/2020    Anesthesia Physical Anesthesia Plan  ASA: III and emergent  Anesthesia Plan: General   Post-op Pain Management:    Induction: Intravenous  PONV Risk Score and Plan: 3 and Dexamethasone, Ondansetron and Treatment may vary due to age or medical condition  Airway Management Planned: Oral ETT  Additional Equipment: None  Intra-op Plan:   Post-operative Plan:   Informed Consent: I have reviewed the patients History and Physical, chart, labs and discussed the procedure including the risks, benefits and alternatives for the proposed anesthesia with the patient or authorized representative who has indicated his/her  understanding and acceptance.     Dental advisory given  Plan Discussed with: CRNA  Anesthesia Plan Comments:         Anesthesia Quick Evaluation

## 2020-04-19 NOTE — ED Notes (Signed)
Report given to [place]. Patient taken to pre op, consent signed
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
## 2020-04-19 NOTE — Brief Op Note (Signed)
04/19/2020  6:44 PM  PATIENT:  Gwendolyn Woodward  54 y.o. female  PRE-OPERATIVE DIAGNOSIS:  fractured right ankle pilon  POST-OPERATIVE DIAGNOSIS:  fractured right ankle pilon  PROCEDURE:  Procedure(s): EXTERNAL FIXATION RIGHT ANKLE (Right)  SURGEON:  Surgeon(s) and Role:    Mcarthur Rossetti, MD - Primary  PHYSICIAN ASSISTANT:  Benita Stabile, PA-C  ANESTHESIA:   general  COUNTS:  YES  PLAN OF CARE: Admit to inpatient   PATIENT DISPOSITION:  PACU - hemodynamically stable.   Delay start of Pharmacological VTE agent (>24hrs) due to surgical blood loss or risk of bleeding: no
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
## 2020-04-20 ENCOUNTER — Inpatient Hospital Stay (HOSPITAL_COMMUNITY): Payer: Medicare Other

## 2020-04-20 LAB — CBC
HCT: 32.6 % — ABNORMAL LOW (ref 36.0–46.0)
Hemoglobin: 10 g/dL — ABNORMAL LOW (ref 12.0–15.0)
MCH: 25.8 pg — ABNORMAL LOW (ref 26.0–34.0)
MCHC: 30.7 g/dL (ref 30.0–36.0)
MCV: 84 fL (ref 80.0–100.0)
Platelets: 169 10*3/uL (ref 150–400)
RBC: 3.88 MIL/uL (ref 3.87–5.11)
RDW: 15.7 % — ABNORMAL HIGH (ref 11.5–15.5)
WBC: 8.4 10*3/uL (ref 4.0–10.5)
nRBC: 0 % (ref 0.0–0.2)

## 2020-04-20 LAB — GLUCOSE, CAPILLARY
Glucose-Capillary: 174 mg/dL — ABNORMAL HIGH (ref 70–99)
Glucose-Capillary: 111 mg/dL — ABNORMAL HIGH (ref 70–99)
Glucose-Capillary: 150 mg/dL — ABNORMAL HIGH (ref 70–99)
Glucose-Capillary: 119 mg/dL — ABNORMAL HIGH (ref 70–99)

## 2020-04-20 LAB — COMPREHENSIVE METABOLIC PANEL
ALT: 17 U/L (ref 0–44)
AST: 22 U/L (ref 15–41)
Albumin: 3 g/dL — ABNORMAL LOW (ref 3.5–5.0)
Alkaline Phosphatase: 86 U/L (ref 38–126)
Anion gap: 11 (ref 5–15)
BUN: 25 mg/dL — ABNORMAL HIGH (ref 6–20)
CO2: 23 mmol/L (ref 22–32)
Calcium: 8.8 mg/dL — ABNORMAL LOW (ref 8.9–10.3)
Chloride: 104 mmol/L (ref 98–111)
Creatinine, Ser: 1.61 mg/dL — ABNORMAL HIGH (ref 0.44–1.00)
GFR calc Af Amer: 41 mL/min — ABNORMAL LOW (ref >60)
GFR calc non Af Amer: 35 mL/min — ABNORMAL LOW (ref >60)
Glucose, Bld: 178 mg/dL — ABNORMAL HIGH (ref 70–99)
Potassium: 5.1 mmol/L (ref 3.5–5.1)
Sodium: 138 mmol/L (ref 135–145)
Total Bilirubin: 0.2 mg/dL — ABNORMAL LOW (ref 0.3–1.2)
Total Protein: 6.3 g/dL — ABNORMAL LOW (ref 6.5–8.1)

## 2020-04-20 LAB — ABO/RH: ABO/RH(D): O POS

## 2020-04-20 MED ORDER — LISINOPRIL 20 MG PO TABS
20.0000 mg | ORAL_TABLET | Freq: Every day | ORAL | Status: DC
  Administered 2020-04-20: 20 mg via ORAL
  Filled 2020-04-20 (×2): qty 1

## 2020-04-20 NOTE — Progress Notes (Signed)
Ortho Trauma Note  I have reviewed the patient's x-rays at the request of Dr. Ninfa Linden. Patient will require formal ORIF. CT scan of ankle is pending. Depending on soft tissue swelling we will determine timing for definitive fixation. I would recommend NPO after midnight for evaluation of soft tissue swelling tomorrow AM. If swelling is appropriate could proceed for ORIF as early as tomorrow pending OR availability. Formal orthopaedic trauma consult to follow tomorrow AM.  Shona Needles, MD [facility] [phone] (office) [email or web address]
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
## 2020-04-20 NOTE — Plan of Care (Signed)
  Problem: Skin Integrity: Goal: Risk for impaired skin integrity will decrease 04/20/2020 0740 by Claire Shown, RN Outcome: Progressing 04/20/2020 0738 by Claire Shown, RN Outcome: Progressing   Problem: Education: Goal: Knowledge of General Education information will improve Description: Including pain rating scale, medication(s)/side effects and non-pharmacologic comfort measures Outcome: Progressing   Problem: Clinical Measurements: Goal: Will remain free from infection 04/20/2020 0740 by Claire Shown, RN Outcome: Progressing 04/20/2020 0738 by Claire Shown, RN Outcome: Progressing   Problem: Clinical Measurements: Goal: Diagnostic test results will improve Outcome: Progressing   Problem: Activity: Goal: Risk for activity intolerance will decrease 04/20/2020 0740 by Claire Shown, RN Outcome: Progressing 04/20/2020 0738 by Claire Shown, RN Outcome: Progressing   Problem: Elimination: Goal: Will not experience complications related to bowel motility 04/20/2020 0740 by Claire Shown, RN Outcome: Progressing 04/20/2020 0738 by Claire Shown, RN Outcome: Progressing   Problem: Elimination: Goal: Will not experience complications related to urinary retention Outcome: Progressing
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
## 2020-04-20 NOTE — Progress Notes (Signed)
Subjective: 1 Day Post-Op Procedure(s) (LRB): EXTERNAL FIXATION RIGHT ANKLE (Right) Patient reports pain as moderate.  The patient is awake and alert and I described in detail what we did yesterday for her right ankle injury.  Objective: Vital signs in last 24 hours: Temp:  [97 F (36.1 C)-98.4 F (36.9 C)] 97.6 F (36.4 C) (08/29 0715) Pulse Rate:  [71-92] 85 (08/29 0715) Resp:  [8-28] 17 (08/29 0715) BP: (117-164)/(71-150) 138/79 (08/29 0715) SpO2:  [90 %-100 %] 99 % (08/29 0715) Weight:  [113.4 kg] 113.4 kg (08/28 1422)  Intake/Output from previous day: 08/28 0701 - 08/29 0700 In: 1655.5 [P.O.:340; I.V.:1115.5; IV Piggyback:200] Out: 310 [Urine:300; Blood:10] Intake/Output this shift: No intake/output data recorded.  Recent Labs    04/19/20 1403 04/19/20 1419 04/20/20 0704  HGB 11.2* 11.9* 10.0*   Recent Labs    04/19/20 1403 04/19/20 1403 04/19/20 1419 04/20/20 0704  WBC 10.2  --   --  8.4  RBC 4.46  --   --  3.88  HCT 36.8   < > 35.0* 32.6*  PLT 156  --   --  169   < > = values in this interval not displayed.   Recent Labs    04/19/20 1403 04/19/20 1403 04/19/20 1419 04/20/20 0704  NA 142   < > 143 138  K 3.8   < > 3.8 PENDING  CL 109   < > 109 104  CO2 22  --   --  23  BUN 21*   < > 22* PENDING  CREATININE 1.10*   < > 1.00 PENDING  GLUCOSE 161*   < > 153* PENDING  CALCIUM 9.1  --   --  8.8*   < > = values in this interval not displayed.   Recent Labs    04/19/20 1403  INR 1.0    Sensation intact distally Intact pulses distally Compartment soft   Assessment/Plan: 1 Day Post-Op Procedure(s) (LRB): EXTERNAL FIXATION RIGHT ANKLE (Right)  We will obtain a CT scan of the right ankle today to assess the joint and fragments of the fracture status post external fixation.  I have contacted the orthopedic traumatologist for their expertise in complex injury such as this.  I have her on a sliding scale insulin.  Her hemoglobin A1c was just over 6.   I put her back on her blood pressure medicine as well.  Again, I explained in detail what is going on with the right ankle and why she will need further treatment for this injury.     Mcarthur Rossetti 04/20/2020, 9:38 AM

## 2020-04-21 ENCOUNTER — Inpatient Hospital Stay (HOSPITAL_COMMUNITY): Payer: Medicare Other

## 2020-04-21 ENCOUNTER — Encounter (HOSPITAL_COMMUNITY): Payer: Self-pay | Admitting: Orthopaedic Surgery

## 2020-04-21 DIAGNOSIS — D62 Acute posthemorrhagic anemia: Secondary | ICD-10-CM

## 2020-04-21 DIAGNOSIS — N9489 Other specified conditions associated with female genital organs and menstrual cycle: Secondary | ICD-10-CM

## 2020-04-21 DIAGNOSIS — E1169 Type 2 diabetes mellitus with other specified complication: Secondary | ICD-10-CM

## 2020-04-21 DIAGNOSIS — E669 Obesity, unspecified: Secondary | ICD-10-CM

## 2020-04-21 DIAGNOSIS — N179 Acute kidney failure, unspecified: Secondary | ICD-10-CM

## 2020-04-21 LAB — CK: Total CK: 724 U/L — ABNORMAL HIGH (ref 38–234)

## 2020-04-21 LAB — BASIC METABOLIC PANEL
Anion gap: 13 (ref 5–15)
BUN: 39 mg/dL — ABNORMAL HIGH (ref 6–20)
CO2: 22 mmol/L (ref 22–32)
Calcium: 8.6 mg/dL — ABNORMAL LOW (ref 8.9–10.3)
Chloride: 102 mmol/L (ref 98–111)
Creatinine, Ser: 3.14 mg/dL — ABNORMAL HIGH (ref 0.44–1.00)
GFR calc Af Amer: 18 mL/min — ABNORMAL LOW (ref >60)
GFR calc non Af Amer: 16 mL/min — ABNORMAL LOW (ref >60)
Glucose, Bld: 120 mg/dL — ABNORMAL HIGH (ref 70–99)
Potassium: 3.9 mmol/L (ref 3.5–5.1)
Sodium: 137 mmol/L (ref 135–145)

## 2020-04-21 LAB — CBC
HCT: 30 % — ABNORMAL LOW (ref 36.0–46.0)
Hemoglobin: 8.9 g/dL — ABNORMAL LOW (ref 12.0–15.0)
MCH: 25 pg — ABNORMAL LOW (ref 26.0–34.0)
MCHC: 29.7 g/dL — ABNORMAL LOW (ref 30.0–36.0)
MCV: 84.3 fL (ref 80.0–100.0)
Platelets: 167 10*3/uL (ref 150–400)
RBC: 3.56 MIL/uL — ABNORMAL LOW (ref 3.87–5.11)
RDW: 15.9 % — ABNORMAL HIGH (ref 11.5–15.5)
WBC: 8.2 10*3/uL (ref 4.0–10.5)
nRBC: 0 % (ref 0.0–0.2)

## 2020-04-21 LAB — PROCALCITONIN: Procalcitonin: 0.68 ng/mL

## 2020-04-21 LAB — GLUCOSE, CAPILLARY
Glucose-Capillary: 121 mg/dL — ABNORMAL HIGH (ref 70–99)
Glucose-Capillary: 132 mg/dL — ABNORMAL HIGH (ref 70–99)
Glucose-Capillary: 89 mg/dL (ref 70–99)
Glucose-Capillary: 116 mg/dL — ABNORMAL HIGH (ref 70–99)
Glucose-Capillary: 116 mg/dL — ABNORMAL HIGH (ref 70–99)
Glucose-Capillary: 122 mg/dL — ABNORMAL HIGH (ref 70–99)

## 2020-04-21 LAB — SODIUM, URINE, RANDOM: Sodium, Ur: 17 mmol/L

## 2020-04-21 LAB — CREATININE, URINE, RANDOM: Creatinine, Urine: 234.52 mg/dL

## 2020-04-21 MED ORDER — INSULIN ASPART 100 UNIT/ML ~~LOC~~ SOLN
0.0000 [IU] | Freq: Three times a day (TID) | SUBCUTANEOUS | Status: DC
  Administered 2020-04-22: 2 [IU] via SUBCUTANEOUS

## 2020-04-21 MED ORDER — INSULIN ASPART 100 UNIT/ML ~~LOC~~ SOLN: 0.0000 [IU] | Freq: Three times a day (TID) | SUBCUTANEOUS | Status: DC

## 2020-04-21 MED ORDER — GABAPENTIN 300 MG PO CAPS
300.0000 mg | ORAL_CAPSULE | Freq: Three times a day (TID) | ORAL | Status: DC
  Administered 2020-04-21: 300 mg via ORAL
  Filled 2020-04-21: qty 1

## 2020-04-21 MED ORDER — GABAPENTIN 300 MG PO CAPS
300.0000 mg | ORAL_CAPSULE | Freq: Two times a day (BID) | ORAL | Status: DC
  Administered 2020-04-22 – 2020-04-25 (×6): 300 mg via ORAL
  Filled 2020-04-21 (×6): qty 1

## 2020-04-21 MED ORDER — HEPARIN SODIUM (PORCINE) 5000 UNIT/ML IJ SOLN: 5000.0000 [IU] | Freq: Three times a day (TID) | INTRAMUSCULAR | Status: DC

## 2020-04-21 MED ORDER — SODIUM CHLORIDE 0.9 % IV SOLN
2.0000 g | INTRAVENOUS | Status: DC
  Administered 2020-04-21 – 2020-04-23 (×3): 2 g via INTRAVENOUS
  Filled 2020-04-21 (×3): qty 20

## 2020-04-21 MED ORDER — HEPARIN SODIUM (PORCINE) 5000 UNIT/ML IJ SOLN
5000.0000 [IU] | Freq: Three times a day (TID) | INTRAMUSCULAR | Status: DC
  Administered 2020-04-22 – 2020-04-25 (×9): 5000 [IU] via SUBCUTANEOUS
  Filled 2020-04-21 (×9): qty 1

## 2020-04-21 MED ORDER — AZITHROMYCIN 500 MG PO TABS
500.0000 mg | ORAL_TABLET | Freq: Every day | ORAL | Status: DC
  Administered 2020-04-21 – 2020-04-23 (×3): 500 mg via ORAL
  Filled 2020-04-21 (×4): qty 1
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
## 2020-04-21 NOTE — Anesthesia Postprocedure Evaluation (Signed)
Anesthesia Post Note  Patient: Gwendolyn Woodward  Procedure(s) Performed: EXTERNAL FIXATION RIGHT ANKLE (Right )     Patient location during evaluation: PACU Anesthesia Type: General Level of consciousness: awake and alert Pain management: pain level controlled Vital Signs Assessment: post-procedure vital signs reviewed and stable Respiratory status: spontaneous breathing, nonlabored ventilation, respiratory function stable and patient connected to nasal cannula oxygen Cardiovascular status: blood pressure returned to baseline and stable Postop Assessment: no apparent nausea or vomiting Anesthetic complications: no   No complications documented.  Last Vitals:  Vitals:   04/21/20 0345 04/21/20 0810  BP: 105/71 102/67  Pulse: 90 75  Resp: 16 15  Temp: (!) 36.4 C 36.5 C  SpO2: 93% 95%    Last Pain:  Vitals:   04/21/20 0911  TempSrc:   PainSc: Asleep                 Tiajuana Amass
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
## 2020-04-21 NOTE — Progress Notes (Signed)
RN rounded on Pt at 13:30, Pt sitting up in the chair complaining of SOB and nausea. Placed Pt on 2L of O2, O2 sat at 97%. Pt assisted to [facility] but did not void, then assisted back to bed. Bladder scan done at 14:00=268cc. Dr. Tamala Julian at bedside updated.   Pt transported to CT and got back to unit at 15:30, vital signs taken and recorded. Pt more alert but states she is sleepy, complains of 9/10 pain on L ankle, L leg elevated, no SOB noted, breathing regular and unlabored on 2L O2. MD updated, will continue to monitor.

## 2020-04-21 NOTE — Plan of Care (Signed)
  Problem: Education: Goal: Knowledge of General Education information will improve Description: Including pain rating scale, medication(s)/side effects and non-pharmacologic comfort measures Outcome: Progressing   Problem: Clinical Measurements: Goal: Will remain free from infection Outcome: Progressing   Problem: Coping: Goal: Level of anxiety will decrease Outcome: Progressing   Problem: Elimination: Goal: Will not experience complications related to bowel motility Outcome: Progressing

## 2020-04-21 NOTE — TOC CAGE-AID Note (Signed)
Transition of Care [facility]) - CAGE-AID Screening   Patient Details  Name: Gwendolyn Woodward MRN: [id number] Date of Birth: March 17, 1966  Transition of Care [facility]) CM/SW Contact:    Emeterio Reeve, [place] Phone Number: 04/21/2020, 11:35 AM   Clinical Narrative:  CSW met with pt at bedside. CSW introduced self and explained her role at the hospital.  PT denies alcohol use and substance use. Pt did not need any resources at this time.    CAGE-AID Screening:    Have You Ever Felt You Ought to Cut Down on Your Drinking or Drug Use?: No Have People Annoyed You By Critizing Your Drinking Or Drug Use?: No Have You Felt Bad Or Guilty About Your Drinking Or Drug Use?: No Have You Ever Had a Drink or Used Drugs First Thing In The Morning to Steady Your Nerves or to Get Rid of a Hangover?: No CAGE-AID Score: 0  Substance Abuse Education Offered: Yes    Blima Ledger, [place] Social Worker [phone]
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
## 2020-04-21 NOTE — Therapy (Signed)
Patient just returned to bed via +2 with nursing.  Patient declines OT eval d/t nausea and decreased ability to stay awake.  OT to continue efforts in the acute setting.

## 2020-04-21 NOTE — Progress Notes (Signed)
Ortho Trauma Note  Patient seen and examined and consult note to follow.  Patient is too swollen to proceed with surgical intervention today.  We will tentatively put her on the schedule for Wednesday.  Will reevaluate her swelling tomorrow and Wednesday to see if it safe to proceed.  Discussed briefly risks and benefits of the surgery.  Shona Needles, MD [facility] [phone] (office) [email or web address]
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
## 2020-04-21 NOTE — Progress Notes (Signed)
[facility]   Full consult to follow  Noted upon chart review what pt has had an acute elevation in creatinine  1.00 on 8/28 1.61 on 8/29 3.14 today (8/30)  meds reviewed Discontinued lisinopril and lovenox  Started sq heparin Pt also had CT with contrast on admission  a1c 6.3% on admission   ABL anemia also present related to fracture   Increased fluids to 125 cc/hr  Medicine consult pending   Jari Pigg, PA-C [phone] (C) 04/21/2020, 10:03 AM  [facility] [place] [address] [phone] Domingo Sep (F)

## 2020-04-22 ENCOUNTER — Encounter (HOSPITAL_COMMUNITY): Payer: Self-pay | Admitting: Orthopaedic Surgery

## 2020-04-22 DIAGNOSIS — J189 Pneumonia, unspecified organism: Secondary | ICD-10-CM

## 2020-04-22 DIAGNOSIS — E119 Type 2 diabetes mellitus without complications: Secondary | ICD-10-CM

## 2020-04-22 LAB — COMPREHENSIVE METABOLIC PANEL
ALT: 7 U/L (ref 0–44)
AST: 18 U/L (ref 15–41)
Albumin: 2.6 g/dL — ABNORMAL LOW (ref 3.5–5.0)
Alkaline Phosphatase: 68 U/L (ref 38–126)
Anion gap: 9 (ref 5–15)
BUN: 36 mg/dL — ABNORMAL HIGH (ref 6–20)
CO2: 23 mmol/L (ref 22–32)
Calcium: 8.2 mg/dL — ABNORMAL LOW (ref 8.9–10.3)
Chloride: 108 mmol/L (ref 98–111)
Creatinine, Ser: 1.72 mg/dL — ABNORMAL HIGH (ref 0.44–1.00)
GFR calc Af Amer: 38 mL/min — ABNORMAL LOW (ref >60)
GFR calc non Af Amer: 33 mL/min — ABNORMAL LOW (ref >60)
Glucose, Bld: 119 mg/dL — ABNORMAL HIGH (ref 70–99)
Potassium: 4.5 mmol/L (ref 3.5–5.1)
Sodium: 140 mmol/L (ref 135–145)
Total Bilirubin: 0.4 mg/dL (ref 0.3–1.2)
Total Protein: 5.4 g/dL — ABNORMAL LOW (ref 6.5–8.1)

## 2020-04-22 LAB — GLUCOSE, CAPILLARY
Glucose-Capillary: 113 mg/dL — ABNORMAL HIGH (ref 70–99)
Glucose-Capillary: 161 mg/dL — ABNORMAL HIGH (ref 70–99)
Glucose-Capillary: 95 mg/dL (ref 70–99)
Glucose-Capillary: 103 mg/dL — ABNORMAL HIGH (ref 70–99)

## 2020-04-22 LAB — CBC WITH DIFFERENTIAL/PLATELET
Abs Immature Granulocytes: 0.02 10*3/uL (ref 0.00–0.07)
Basophils Absolute: 0 10*3/uL (ref 0.0–0.1)
Basophils Relative: 0 %
Eosinophils Absolute: 0.2 10*3/uL (ref 0.0–0.5)
Eosinophils Relative: 3 %
HCT: 25.6 % — ABNORMAL LOW (ref 36.0–46.0)
Hemoglobin: 7.8 g/dL — ABNORMAL LOW (ref 12.0–15.0)
Immature Granulocytes: 0 %
Lymphocytes Relative: 27 %
Lymphs Abs: 1.7 10*3/uL (ref 0.7–4.0)
MCH: 25.7 pg — ABNORMAL LOW (ref 26.0–34.0)
MCHC: 30.5 g/dL (ref 30.0–36.0)
MCV: 84.2 fL (ref 80.0–100.0)
Monocytes Absolute: 0.5 10*3/uL (ref 0.1–1.0)
Monocytes Relative: 7 %
Neutro Abs: 4 10*3/uL (ref 1.7–7.7)
Neutrophils Relative %: 63 %
Platelets: 172 10*3/uL (ref 150–400)
RBC: 3.04 MIL/uL — ABNORMAL LOW (ref 3.87–5.11)
RDW: 15.9 % — ABNORMAL HIGH (ref 11.5–15.5)
WBC: 6.4 10*3/uL (ref 4.0–10.5)
nRBC: 0 % (ref 0.0–0.2)

## 2020-04-22 LAB — PREPARE RBC (CROSSMATCH)

## 2020-04-22 MED ORDER — SODIUM CHLORIDE 0.9% IV SOLUTION: Freq: Once | INTRAVENOUS | Status: AC
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
## 2020-04-22 NOTE — Progress Notes (Signed)
PT Cancellation Note  Patient Details Name: JONNAE FONSECA MRN: [id number] DOB: [birth year]   Cancelled Treatment:    Reason Eval/Treat Not Completed: Fatigue/lethargy limiting ability to participate. Patient sleeping in recliner, will re-attempt later.    Conetta,Kristyn 04/22/2020, 11:49 AM
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
## 2020-04-22 NOTE — Progress Notes (Signed)
Patient ID: Gwendolyn Woodward, female   DOB: May 31, 1966, 54 y.o.   MRN: [id number] I appreciate the consultations from both the orthopedic traumatologist and [facility] hospitalists as relates to both the complex nature of this patient's right lower extremity injury as well as her comorbidities.  The hospitalist were consulted secondary to the patient's acute renal insufficiency with an increase in her creatinine.  Labs this morning are still pending.  There is potential for surgery on her right ankle tomorrow depending on the nature of the soft tissue and swelling.  I did speak with her this morning about that.  I did not take any dressings on this morning, but her right lower extremity feels soft.  I will check on her later today as well.

## 2020-04-22 NOTE — Progress Notes (Signed)
Patient ID: Gwendolyn Woodward, female   DOB: 1965-10-06, 53 y.o.   MRN: [id number] I did take down the dressings from the patient's right ankle today to make sure there is no fracture blistering and to make sure that the soft tissue swelling was not significant.  Compared to her other ankle, the right ankle does show some swelling but again there is no fracture blistering and the skin does not appear tight.  The patient's hemoglobin is 7.8 and her creatinine is 1.72 which is down from 3.14 yesterday.  I did speak with the patient about giving her a single unit of blood which I think would be helpful given the potential for upcoming surgery and further acute blood loss anemia from surgery and her fracture.  Also feel that a single unit of blood will certainly help with kidney function.  I explained the rationale behind this to the patient and she agrees with getting a single unit of blood today.
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
## 2020-04-23 ENCOUNTER — Encounter (HOSPITAL_COMMUNITY)
Admission: EM | Disposition: A | Discharge status: Skilled Nursing Facility | Payer: Self-pay | Source: Home / Self Care | Attending: Orthopaedic Surgery

## 2020-04-23 ENCOUNTER — Inpatient Hospital Stay (HOSPITAL_COMMUNITY): Payer: Medicare Other | Admitting: Anesthesiology

## 2020-04-23 ENCOUNTER — Inpatient Hospital Stay (HOSPITAL_COMMUNITY): Payer: Medicare Other

## 2020-04-23 DIAGNOSIS — J189 Pneumonia, unspecified organism: Secondary | ICD-10-CM

## 2020-04-23 DIAGNOSIS — N179 Acute kidney failure, unspecified: Secondary | ICD-10-CM

## 2020-04-23 DIAGNOSIS — S82874A Nondisplaced pilon fracture of right tibia, initial encounter for closed fracture: Secondary | ICD-10-CM

## 2020-04-23 HISTORY — PX: OPEN REDUCTION INTERNAL FIXATION (ORIF) TIBIA/FIBULA FRACTURE: SHX5992

## 2020-04-23 LAB — GLUCOSE, CAPILLARY
Glucose-Capillary: 117 mg/dL — ABNORMAL HIGH (ref 70–99)
Glucose-Capillary: 145 mg/dL — ABNORMAL HIGH (ref 70–99)
Glucose-Capillary: 177 mg/dL — ABNORMAL HIGH (ref 70–99)
Glucose-Capillary: 154 mg/dL — ABNORMAL HIGH (ref 70–99)

## 2020-04-23 LAB — BASIC METABOLIC PANEL
Anion gap: 8 (ref 5–15)
BUN: 24 mg/dL — ABNORMAL HIGH (ref 6–20)
CO2: 24 mmol/L (ref 22–32)
Calcium: 8.7 mg/dL — ABNORMAL LOW (ref 8.9–10.3)
Chloride: 107 mmol/L (ref 98–111)
Creatinine, Ser: 1.05 mg/dL — ABNORMAL HIGH (ref 0.44–1.00)
GFR calc Af Amer: >60 mL/min (ref >60)
GFR calc non Af Amer: 59 mL/min — ABNORMAL LOW (ref >60)
Glucose, Bld: 100 mg/dL — ABNORMAL HIGH (ref 70–99)
Potassium: 4.6 mmol/L (ref 3.5–5.1)
Sodium: 139 mmol/L (ref 135–145)

## 2020-04-23 LAB — TYPE AND SCREEN
ABO/RH(D): O POS
Antibody Screen: NEGATIVE
Unit division: 0

## 2020-04-23 LAB — BPAM RBC
Blood Product Expiration Date: 202109292359
ISSUE DATE / TIME: 202108311255
Unit Type and Rh: 5100

## 2020-04-23 LAB — CBC
HCT: 30.1 % — ABNORMAL LOW (ref 36.0–46.0)
Hemoglobin: 9.1 g/dL — ABNORMAL LOW (ref 12.0–15.0)
MCH: 25.5 pg — ABNORMAL LOW (ref 26.0–34.0)
MCHC: 30.2 g/dL (ref 30.0–36.0)
MCV: 84.3 fL (ref 80.0–100.0)
Platelets: 158 10*3/uL (ref 150–400)
RBC: 3.57 MIL/uL — ABNORMAL LOW (ref 3.87–5.11)
RDW: 15.7 % — ABNORMAL HIGH (ref 11.5–15.5)
WBC: 6.6 10*3/uL (ref 4.0–10.5)
nRBC: 0 % (ref 0.0–0.2)

## 2020-04-23 LAB — SURGICAL PCR SCREEN
MRSA, PCR: NEGATIVE
Staphylococcus aureus: NEGATIVE

## 2020-04-23 LAB — VITAMIN D 25 HYDROXY (VIT D DEFICIENCY, FRACTURES): Vit D, 25-Hydroxy: 24.05 ng/mL — ABNORMAL LOW (ref 30–100)

## 2020-04-23 SURGERY — OPEN REDUCTION INTERNAL FIXATION (ORIF) TIBIA/FIBULA FRACTURE
Anesthesia: General | Laterality: Right

## 2020-04-23 MED ORDER — 0.9 % SODIUM CHLORIDE (POUR BTL) OPTIME
TOPICAL | Status: DC | PRN
  Administered 2020-04-23: 1000 mL

## 2020-04-23 MED ORDER — BUPIVACAINE-EPINEPHRINE (PF) 0.5% -1:200000 IJ SOLN
INTRAMUSCULAR | Status: DC | PRN
  Administered 2020-04-23: 10 mL via PERINEURAL
  Administered 2020-04-23: 20 mL via PERINEURAL

## 2020-04-23 MED ORDER — VANCOMYCIN HCL 1000 MG IV SOLR
INTRAVENOUS | Status: DC | PRN
  Administered 2020-04-23: 1000 mg

## 2020-04-23 MED ORDER — FENTANYL CITRATE (PF) 100 MCG/2ML IJ SOLN
25.0000 ug | INTRAMUSCULAR | Status: DC | PRN
  Administered 2020-04-23: 50 ug via INTRAVENOUS

## 2020-04-23 MED ORDER — LIDOCAINE 2% (20 MG/ML) 5 ML SYRINGE
INTRAMUSCULAR | Status: DC | PRN
  Administered 2020-04-23: 100 mg via INTRAVENOUS

## 2020-04-23 MED ORDER — BUPIVACAINE-EPINEPHRINE (PF) 0.5% -1:200000 IJ SOLN: INTRAMUSCULAR | Status: DC | PRN

## 2020-04-23 MED ORDER — FENTANYL CITRATE (PF) 250 MCG/5ML IJ SOLN
INTRAMUSCULAR | Status: AC
  Filled 2020-04-23: qty 5

## 2020-04-23 MED ORDER — DEXAMETHASONE SODIUM PHOSPHATE 10 MG/ML IJ SOLN
INTRAMUSCULAR | Status: DC | PRN
  Administered 2020-04-23: 10 mg via INTRAVENOUS

## 2020-04-23 MED ORDER — OXYCODONE HCL 5 MG PO TABS: 5.0000 mg | ORAL_TABLET | Freq: Once | ORAL | Status: DC | PRN

## 2020-04-23 MED ORDER — FENTANYL CITRATE (PF) 100 MCG/2ML IJ SOLN
INTRAMUSCULAR | Status: DC | PRN
  Administered 2020-04-23: 100 ug via INTRAVENOUS
  Administered 2020-04-23 (×2): 25 ug via INTRAVENOUS
  Administered 2020-04-23 (×2): 50 ug via INTRAVENOUS

## 2020-04-23 MED ORDER — MIDAZOLAM HCL 2 MG/2ML IJ SOLN
INTRAMUSCULAR | Status: AC
  Filled 2020-04-23: qty 2

## 2020-04-23 MED ORDER — LIDOCAINE 2% (20 MG/ML) 5 ML SYRINGE
INTRAMUSCULAR | Status: AC
  Filled 2020-04-23: qty 5

## 2020-04-23 MED ORDER — PHENYLEPHRINE 40 MCG/ML (10ML) SYRINGE FOR IV PUSH (FOR BLOOD PRESSURE SUPPORT)
PREFILLED_SYRINGE | INTRAVENOUS | Status: DC | PRN
  Administered 2020-04-23 (×2): 120 ug via INTRAVENOUS

## 2020-04-23 MED ORDER — ACETAMINOPHEN 325 MG PO TABS
650.0000 mg | ORAL_TABLET | Freq: Four times a day (QID) | ORAL | Status: DC
  Administered 2020-04-23 – 2020-04-25 (×6): 650 mg via ORAL
  Filled 2020-04-23 (×6): qty 2

## 2020-04-23 MED ORDER — PROPOFOL 10 MG/ML IV BOLUS
INTRAVENOUS | Status: DC | PRN
  Administered 2020-04-23: 200 mg via INTRAVENOUS

## 2020-04-23 MED ORDER — PROPOFOL 10 MG/ML IV BOLUS
INTRAVENOUS | Status: AC
  Filled 2020-04-23: qty 20

## 2020-04-23 MED ORDER — EPHEDRINE SULFATE-NACL 50-0.9 MG/10ML-% IV SOSY
PREFILLED_SYRINGE | INTRAVENOUS | Status: DC | PRN
  Administered 2020-04-23 (×2): 10 mg via INTRAVENOUS

## 2020-04-23 MED ORDER — SUCCINYLCHOLINE CHLORIDE 200 MG/10ML IV SOSY
PREFILLED_SYRINGE | INTRAVENOUS | Status: AC
  Filled 2020-04-23: qty 10

## 2020-04-23 MED ORDER — LACTATED RINGERS IV SOLN: INTRAVENOUS | Status: DC | PRN

## 2020-04-23 MED ORDER — ONDANSETRON HCL 4 MG/2ML IJ SOLN
INTRAMUSCULAR | Status: DC | PRN
  Administered 2020-04-23: 4 mg via INTRAVENOUS

## 2020-04-23 MED ORDER — CLONIDINE HCL (ANALGESIA) 100 MCG/ML EP SOLN
EPIDURAL | Status: DC | PRN
  Administered 2020-04-23: 33 ug
  Administered 2020-04-23: 67 ug

## 2020-04-23 MED ORDER — FENTANYL CITRATE (PF) 100 MCG/2ML IJ SOLN
INTRAMUSCULAR | Status: AC
  Filled 2020-04-23: qty 2

## 2020-04-23 MED ORDER — OXYCODONE HCL 5 MG/5ML PO SOLN: 5.0000 mg | Freq: Once | ORAL | Status: DC | PRN

## 2020-04-23 MED ORDER — VANCOMYCIN HCL 1000 MG IV SOLR
INTRAVENOUS | Status: AC
  Filled 2020-04-23: qty 1000

## 2020-04-23 MED ORDER — MIDAZOLAM HCL 5 MG/5ML IJ SOLN
INTRAMUSCULAR | Status: DC | PRN
  Administered 2020-04-23: 2 mg via INTRAVENOUS

## 2020-04-23 MED ORDER — PROMETHAZINE HCL 25 MG/ML IJ SOLN: 6.2500 mg | INTRAMUSCULAR | Status: DC | PRN

## 2020-04-23 SURGICAL SUPPLY — 80 items
ADAPTER GUIDE FAST 1.6 (MISCELLANEOUS) ×1 IMPLANT
ADPR INSRT FST GD (MISCELLANEOUS) ×1
APL PRP STRL LF DISP 70% ISPRP (MISCELLANEOUS) ×1
BANDAGE ESMARK 6X9 LF (GAUZE/BANDAGES/DRESSINGS) ×1 IMPLANT
BIT DRILL 2.5X2.75 QC CALB (BIT) ×1 IMPLANT
BIT DRILL CALIBRATED 2.7 (BIT) ×1 IMPLANT
BNDG CMPR 9X6 STRL LF SNTH (GAUZE/BANDAGES/DRESSINGS) ×1
BNDG COHESIVE 4X5 TAN STRL (GAUZE/BANDAGES/DRESSINGS) ×2 IMPLANT
BNDG ELASTIC 4X5.8 VLCR STR LF (GAUZE/BANDAGES/DRESSINGS) ×1 IMPLANT
BNDG ELASTIC 6X5.8 VLCR STR LF (GAUZE/BANDAGES/DRESSINGS) ×1 IMPLANT
BNDG ESMARK 6X9 LF (GAUZE/BANDAGES/DRESSINGS) ×2
BRUSH SCRUB EZ PLAIN DRY (MISCELLANEOUS) ×4 IMPLANT
CHLORAPREP W/TINT 26 (MISCELLANEOUS) ×2 IMPLANT
COVER SURGICAL LIGHT HANDLE (MISCELLANEOUS) ×2 IMPLANT
COVER WAND RF STERILE (DRAPES) ×2 IMPLANT
DRAPE C-ARM 42X72 X-RAY (DRAPES) ×2 IMPLANT
DRAPE C-ARMOR (DRAPES) ×2 IMPLANT
DRAPE ORTHO SPLIT 77X108 STRL (DRAPES) ×4
DRAPE SURG ORHT 6 SPLT 77X108 (DRAPES) ×2 IMPLANT
DRAPE U-SHAPE 47X51 STRL (DRAPES) ×2 IMPLANT
DRSG ADAPTIC 3X8 NADH LF (GAUZE/BANDAGES/DRESSINGS) IMPLANT
DRSG MEPITEL 4X7.2 (GAUZE/BANDAGES/DRESSINGS) ×1 IMPLANT
DRSG PAD ABDOMINAL 8X10 ST (GAUZE/BANDAGES/DRESSINGS) ×1 IMPLANT
ELECT REM PT RETURN 9FT ADLT (ELECTROSURGICAL) ×2
ELECTRODE REM PT RTRN 9FT ADLT (ELECTROSURGICAL) ×1 IMPLANT
GAUZE SPONGE 4X4 12PLY STRL (GAUZE/BANDAGES/DRESSINGS) ×1 IMPLANT
GLOVE BIO SURGEON STRL SZ 6.5 (GLOVE) ×6 IMPLANT
GLOVE BIO SURGEON STRL SZ7.5 (GLOVE) ×6 IMPLANT
GLOVE BIOGEL PI IND STRL 6.5 (GLOVE) ×1 IMPLANT
GLOVE BIOGEL PI IND STRL 7.5 (GLOVE) ×1 IMPLANT
GLOVE BIOGEL PI INDICATOR 6.5 (GLOVE) ×1
GLOVE BIOGEL PI INDICATOR 7.5 (GLOVE) ×1
GOWN STRL REUS W/ TWL LRG LVL3 (GOWN DISPOSABLE) ×2 IMPLANT
GOWN STRL REUS W/TWL LRG LVL3 (GOWN DISPOSABLE) ×4
K-WIRE ACE 1.6X6 (WIRE) ×16
KIT TURNOVER KIT B (KITS) ×2 IMPLANT
KWIRE ACE 1.6X6 (WIRE) IMPLANT
MANIFOLD NEPTUNE II (INSTRUMENTS) ×2 IMPLANT
NDL HYPO 21X1.5 SAFETY (NEEDLE) IMPLANT
NDL HYPO 25GX1X1/2 BEV (NEEDLE) ×1 IMPLANT
NEEDLE HYPO 21X1.5 SAFETY (NEEDLE) IMPLANT
NEEDLE HYPO 25GX1X1/2 BEV (NEEDLE) ×2 IMPLANT
NS IRRIG 1000ML POUR BTL (IV SOLUTION) ×2 IMPLANT
PACK TOTAL JOINT (CUSTOM PROCEDURE TRAY) ×2 IMPLANT
PAD ARMBOARD 7.5X6 YLW CONV (MISCELLANEOUS) ×4 IMPLANT
PAD CAST 4YDX4 CTTN HI CHSV (CAST SUPPLIES) IMPLANT
PADDING CAST COTTON 4X4 STRL (CAST SUPPLIES) ×2
PADDING CAST COTTON 6X4 STRL (CAST SUPPLIES) ×1 IMPLANT
PLATE 6H RT DIST ANTLAT TIB (Plate) ×2 IMPLANT
PLATE ANTLAT CNTR NAR 114X6 (Plate) IMPLANT
PLATE LOCK 6H 77 BILAT FIB (Plate) ×1 IMPLANT
SCREW CORTICAL 3.5MM  28MM (Screw) ×4 IMPLANT
SCREW CORTICAL 3.5MM  30MM (Screw) ×2 IMPLANT
SCREW CORTICAL 3.5MM 28MM (Screw) IMPLANT
SCREW CORTICAL 3.5MM 30MM (Screw) IMPLANT
SCREW LOCK CORT STAR 3.5X10 (Screw) ×1 IMPLANT
SCREW LOCK CORT STAR 3.5X28 (Screw) ×1 IMPLANT
SCREW LOCK CORT STAR 3.5X36 (Screw) ×1 IMPLANT
SCREW LOCK CORT STAR 3.5X42 (Screw) ×1 IMPLANT
SCREW LOCK CORT STAR 3.5X44 (Screw) ×1 IMPLANT
SCREW LP NL T15 3.5X20 (Screw) ×1 IMPLANT
SCREW LP NL T15 3.5X22 (Screw) ×1 IMPLANT
SCREW LP NL T15 3.5X26 (Screw) ×1 IMPLANT
SCREW T15 LP CORT 3.5X46MM NS (Screw) ×1 IMPLANT
SCREW T15 MD 3.5X38MM NS (Screw) ×1 IMPLANT
SPONGE LAP 18X18 RF (DISPOSABLE) IMPLANT
STAPLER VISISTAT 35W (STAPLE) ×2 IMPLANT
SUCTION FRAZIER HANDLE 10FR (MISCELLANEOUS) ×2
SUCTION TUBE FRAZIER 10FR DISP (MISCELLANEOUS) ×1 IMPLANT
SUT ETHILON 3 0 PS 1 (SUTURE) ×6 IMPLANT
SUT PROLENE 0 CT (SUTURE) IMPLANT
SUT VIC AB 0 CT1 27 (SUTURE) ×4
SUT VIC AB 0 CT1 27XBRD ANBCTR (SUTURE) ×1 IMPLANT
SUT VIC AB 2-0 CT1 27 (SUTURE) ×8
SUT VIC AB 2-0 CT1 TAPERPNT 27 (SUTURE) ×2 IMPLANT
SYR CONTROL 10ML LL (SYRINGE) ×2 IMPLANT
TOWEL GREEN STERILE (TOWEL DISPOSABLE) ×4 IMPLANT
TOWEL GREEN STERILE FF (TOWEL DISPOSABLE) ×2 IMPLANT
UNDERPAD 30X36 HEAVY ABSORB (UNDERPADS AND DIAPERS) ×2 IMPLANT
WATER STERILE IRR 1000ML POUR (IV SOLUTION) ×2 IMPLANT

## 2020-04-23 NOTE — Anesthesia Procedure Notes (Signed)
Anesthesia Regional Block: Popliteal block   Pre-Anesthetic Checklist: ,, timeout performed, Correct Patient, Correct Site, Correct Laterality, Correct Procedure, Correct Position, site marked, Risks and benefits discussed, pre-op evaluation,  At surgeon's request and post-op pain management  Laterality: Right  Prep: Maximum Sterile Barrier Precautions used, chloraprep       Needles:  Injection technique: Single-shot  Needle Type: Echogenic Stimulator Needle     Needle Length: 9cm  Needle Gauge: 22     Additional Needles:   Procedures:,,,, ultrasound used (permanent image in chart),,,,  Narrative:  Start time: 04/23/2020 10:02 AM End time: 04/23/2020 10:04 AM Injection made incrementally with aspirations every 5 mL.  Performed by: Personally  Anesthesiologist: Brennan Bailey, MD  Additional Notes: Risks, benefits, and alternative discussed. Patient gave consent for procedure. Patient prepped and draped in sterile fashion. Sedation administered, patient remains easily responsive to voice. Relevant anatomy identified with ultrasound guidance. Local anesthetic given in 5cc increments with no signs or symptoms of intravascular injection. No pain or paraesthesias with injection. Patient monitored throughout procedure with signs of LAST or immediate complications. Tolerated well. Ultrasound image placed in chart.  Tawny Asal, MD

## 2020-04-23 NOTE — Interval H&P Note (Signed)
History and Physical Interval Note:  04/23/2020 8:22 AM  Gwendolyn Woodward  has presented today for surgery, with the diagnosis of Right pilon fracture.  The various methods of treatment have been discussed with the patient and family. After consideration of risks, benefits and other options for treatment, the patient has consented to  Procedure(s): OPEN REDUCTION INTERNAL FIXATION (ORIF) TIBIA/FIBULA FRACTURE (Right) as a surgical intervention.  The patient's history has been reviewed, patient examined, no change in status, stable for surgery.  I have reviewed the patient's chart and labs.  Questions were answered to the patient's satisfaction.     Lennette Bihari P Korrin Waterfield
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
## 2020-04-23 NOTE — Anesthesia Procedure Notes (Signed)
Procedure Name: LMA Insertion Date/Time: 04/23/2020 8:38 AM Performed by: Lance Coon, CRNA Pre-anesthesia Checklist: Patient identified, Emergency Drugs available, Suction available, Patient being monitored and Timeout performed Patient Re-evaluated:Patient Re-evaluated prior to induction Oxygen Delivery Method: Circle system utilized Preoxygenation: Pre-oxygenation with 100% oxygen Induction Type: IV induction LMA Size: 3.0 Number of attempts: 1 Placement Confirmation: positive ETCO2 and breath sounds checked- equal and bilateral Tube secured with: Tape Dental Injury: Teeth and Oropharynx as per pre-operative assessment

## 2020-04-23 NOTE — Anesthesia Procedure Notes (Signed)
Anesthesia Regional Block: Adductor canal block   Pre-Anesthetic Checklist: ,, timeout performed, Correct Patient, Correct Site, Correct Laterality, Correct Procedure, Correct Position, site marked, Risks and benefits discussed, pre-op evaluation,  At surgeon's request and post-op pain management  Laterality: Right  Prep: Maximum Sterile Barrier Precautions used, chloraprep       Needles:  Injection technique: Single-shot  Needle Type: Echogenic Stimulator Needle     Needle Length: 9cm  Needle Gauge: 22     Additional Needles:   Procedures:,,,, ultrasound used (permanent image in chart),,,,  Narrative:  Start time: 04/23/2020 9:58 AM End time: 04/23/2020 10:01 AM Injection made incrementally with aspirations every 5 mL.  Performed by: Personally  Anesthesiologist: Brennan Bailey, MD  Additional Notes: Risks, benefits, and alternative discussed. Patient gave consent for procedure. Patient prepped and draped in sterile fashion. Sedation administered, patient remains easily responsive to voice. Relevant anatomy identified with ultrasound guidance. Local anesthetic given in 5cc increments with no signs or symptoms of intravascular injection. No pain or paraesthesias with injection. Patient monitored throughout procedure with signs of LAST or immediate complications. Tolerated well. Ultrasound image placed in chart.  Tawny Asal, MD

## 2020-04-23 NOTE — Transfer of Care (Signed)
Immediate Anesthesia Transfer of Care Note  Patient: Gwendolyn Woodward  Procedure(s) Performed: OPEN REDUCTION INTERNAL FIXATION (ORIF) TIBIA/FIBULA FRACTURE (Right )  Patient Location: PACU  Anesthesia Type:GA combined with regional for post-op pain  Level of Consciousness: drowsy and patient cooperative  Airway & Oxygen Therapy: Patient Spontanous Breathing  Post-op Assessment: Report given to RN and Post -op Vital signs reviewed and stable  Post vital signs: Reviewed and stable  Last Vitals:  Vitals Value Taken Time  BP    Temp    Pulse 106 04/23/20 1115  Resp 11 04/23/20 1115  SpO2 98 % 04/23/20 1115  Vitals shown include unvalidated device data.  Last Pain:  Vitals:   04/23/20 0550  TempSrc:   PainSc: 7       Patients Stated Pain Goal: 4 (06/22/58 4585)  Complications: No complications documented.
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
## 2020-04-23 NOTE — Anesthesia Postprocedure Evaluation (Signed)
Anesthesia Post Note  Patient: Gwendolyn Woodward  Procedure(s) Performed: OPEN REDUCTION INTERNAL FIXATION (ORIF) TIBIA/FIBULA FRACTURE (Right )     Patient location during evaluation: PACU Anesthesia Type: General Level of consciousness: awake and alert and oriented Pain management: pain level controlled Vital Signs Assessment: post-procedure vital signs reviewed and stable Respiratory status: spontaneous breathing, nonlabored ventilation and respiratory function stable Cardiovascular status: blood pressure returned to baseline Postop Assessment: no apparent nausea or vomiting Anesthetic complications: no   No complications documented.  Last Vitals:  Vitals:   04/23/20 1230 04/23/20 1303  BP: 130/76 137/80  Pulse: 86 88  Resp: (!) 7 15  Temp: 36.6 C (!) 36.3 C  SpO2: 95% 100%    Last Pain:  Vitals:   04/23/20 1303  TempSrc: Oral  PainSc:                  Brennan Bailey
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
## 2020-04-24 ENCOUNTER — Encounter (HOSPITAL_COMMUNITY): Payer: Self-pay | Admitting: Student

## 2020-04-24 LAB — CBC
HCT: 26.2 % — ABNORMAL LOW (ref 36.0–46.0)
Hemoglobin: 8.1 g/dL — ABNORMAL LOW (ref 12.0–15.0)
MCH: 25.6 pg — ABNORMAL LOW (ref 26.0–34.0)
MCHC: 30.9 g/dL (ref 30.0–36.0)
MCV: 82.9 fL (ref 80.0–100.0)
Platelets: 150 10*3/uL (ref 150–400)
RBC: 3.16 MIL/uL — ABNORMAL LOW (ref 3.87–5.11)
RDW: 15.7 % — ABNORMAL HIGH (ref 11.5–15.5)
WBC: 8.1 10*3/uL (ref 4.0–10.5)
nRBC: 0 % (ref 0.0–0.2)

## 2020-04-24 LAB — BASIC METABOLIC PANEL
Anion gap: 9 (ref 5–15)
BUN: 22 mg/dL — ABNORMAL HIGH (ref 6–20)
CO2: 24 mmol/L (ref 22–32)
Calcium: 8.5 mg/dL — ABNORMAL LOW (ref 8.9–10.3)
Chloride: 107 mmol/L (ref 98–111)
Creatinine, Ser: 0.89 mg/dL (ref 0.44–1.00)
GFR calc Af Amer: >60 mL/min (ref >60)
GFR calc non Af Amer: >60 mL/min (ref >60)
Glucose, Bld: 110 mg/dL — ABNORMAL HIGH (ref 70–99)
Potassium: 4.2 mmol/L (ref 3.5–5.1)
Sodium: 140 mmol/L (ref 135–145)

## 2020-04-24 LAB — GLUCOSE, CAPILLARY
Glucose-Capillary: 105 mg/dL — ABNORMAL HIGH (ref 70–99)
Glucose-Capillary: 93 mg/dL (ref 70–99)
Glucose-Capillary: 110 mg/dL — ABNORMAL HIGH (ref 70–99)
Glucose-Capillary: 111 mg/dL — ABNORMAL HIGH (ref 70–99)

## 2020-04-24 MED ORDER — AMOXICILLIN-POT CLAVULANATE 875-125 MG PO TABS
1.0000 | ORAL_TABLET | Freq: Two times a day (BID) | ORAL | Status: DC
  Administered 2020-04-24 – 2020-04-25 (×3): 1 via ORAL
  Filled 2020-04-24 (×3): qty 1

## 2020-04-24 MED ORDER — KETOROLAC TROMETHAMINE 15 MG/ML IJ SOLN
15.0000 mg | Freq: Four times a day (QID) | INTRAMUSCULAR | Status: AC
  Administered 2020-04-24 – 2020-04-25 (×5): 15 mg via INTRAVENOUS
  Filled 2020-04-24 (×5): qty 1

## 2020-04-24 MED ORDER — VITAMIN D 25 MCG (1000 UNIT) PO TABS
1000.0000 [IU] | ORAL_TABLET | Freq: Every day | ORAL | Status: DC
  Administered 2020-04-24 – 2020-04-25 (×2): 1000 [IU] via ORAL
  Filled 2020-04-24 (×2): qty 1
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
## 2020-04-25 LAB — CBC
HCT: 26.8 % — ABNORMAL LOW (ref 36.0–46.0)
Hemoglobin: 8.3 g/dL — ABNORMAL LOW (ref 12.0–15.0)
MCH: 26.4 pg (ref 26.0–34.0)
MCHC: 31 g/dL (ref 30.0–36.0)
MCV: 85.4 fL (ref 80.0–100.0)
Platelets: 160 10*3/uL (ref 150–400)
RBC: 3.14 MIL/uL — ABNORMAL LOW (ref 3.87–5.11)
RDW: 16.4 % — ABNORMAL HIGH (ref 11.5–15.5)
WBC: 6.7 10*3/uL (ref 4.0–10.5)
nRBC: 0 % (ref 0.0–0.2)

## 2020-04-25 LAB — BASIC METABOLIC PANEL
Anion gap: 9 (ref 5–15)
BUN: 21 mg/dL — ABNORMAL HIGH (ref 6–20)
CO2: 23 mmol/L (ref 22–32)
Calcium: 8.5 mg/dL — ABNORMAL LOW (ref 8.9–10.3)
Chloride: 110 mmol/L (ref 98–111)
Creatinine, Ser: 1.01 mg/dL — ABNORMAL HIGH (ref 0.44–1.00)
GFR calc Af Amer: >60 mL/min (ref >60)
GFR calc non Af Amer: >60 mL/min (ref >60)
Glucose, Bld: 115 mg/dL — ABNORMAL HIGH (ref 70–99)
Potassium: 4.2 mmol/L (ref 3.5–5.1)
Sodium: 142 mmol/L (ref 135–145)

## 2020-04-25 LAB — SARS CORONAVIRUS 2 BY RT PCR (HOSPITAL ORDER, PERFORMED IN ~~LOC~~ HOSPITAL LAB): SARS Coronavirus 2: NEGATIVE

## 2020-04-25 LAB — GLUCOSE, CAPILLARY
Glucose-Capillary: 110 mg/dL — ABNORMAL HIGH (ref 70–99)
Glucose-Capillary: 107 mg/dL — ABNORMAL HIGH (ref 70–99)
Glucose-Capillary: 118 mg/dL — ABNORMAL HIGH (ref 70–99)

## 2020-04-25 MED ORDER — METHOCARBAMOL 500 MG PO TABS: 500.0000 mg | ORAL_TABLET | Freq: Four times a day (QID) | ORAL | 0 refills | Status: DC | PRN

## 2020-04-25 MED ORDER — VITAMIN D3 25 MCG PO TABS
1000.0000 [IU] | ORAL_TABLET | Freq: Every day | ORAL | 2 refills | Status: DC
Start: 2020-04-25 — End: 2021-07-09

## 2020-04-25 MED ORDER — AMOXICILLIN-POT CLAVULANATE 875-125 MG PO TABS: 1.0000 | ORAL_TABLET | Freq: Two times a day (BID) | ORAL | 0 refills | Status: AC

## 2020-04-25 MED ORDER — OXYCODONE HCL 10 MG PO TABS: 10.0000 mg | ORAL_TABLET | ORAL | 0 refills | Status: DC | PRN

## 2020-04-25 MED ORDER — ENOXAPARIN SODIUM 40 MG/0.4ML ~~LOC~~ SOLN: 40.0000 mg | SUBCUTANEOUS | 0 refills | Status: DC

## 2020-04-25 MED ORDER — ACETAMINOPHEN 325 MG PO TABS: 650.0000 mg | ORAL_TABLET | Freq: Four times a day (QID) | ORAL | 0 refills | Status: DC | PRN

## 2020-04-25 MED ORDER — DOCUSATE SODIUM 100 MG PO CAPS: 100.0000 mg | ORAL_CAPSULE | Freq: Two times a day (BID) | ORAL | 0 refills | Status: DC | PRN

## 2020-04-25 NOTE — TOC Transition Note (Addendum)
Transition of Care [facility]) - CM/SW Discharge Note   Patient Details  Name: Gwendolyn Woodward MRN: [id number] Date of Birth: 1965/12/15  Transition of Care [facility]) CM/SW Contact:  Curlene Labrum, RN Phone Number: 04/25/2020, 2:40 PM   Clinical Narrative:    Case Management noted that the patient's COVID screen was negative.  I called [facility] and notified them and placed clinicals in the hub for the patient.  The patient and family were notified of the patient's transfer to the facility today.  I called [facility] and asked for transport to the facility.  The nurse is to call report to [facility] at [phone]- Room 118.   Final next level of care: Skilled Nursing Facility Barriers to Discharge: Continued Medical Work up, SNF Pending bed offer   Patient Goals and CMS Choice Patient states their goals for this hospitalization and ongoing recovery are:: Patient plans to discharge to a SNF and then live with her daughter in [place], [place] CMS [place].gov Compare Post Acute Care list provided to:: Patient Choice offered to / list presented to : Patient  Discharge Placement                       Discharge Plan and Services   Discharge Planning Services: CM Consult Post Acute Care Choice: [place]                               Social Determinants of Health (SDOH) Interventions     Readmission Risk Interventions Readmission Risk Prevention Plan 04/24/2020  Post Dischage Appt Complete  Medication Screening Complete  Transportation Screening Complete  Some recent data might be hidden

## 2020-04-25 NOTE — TOC Progression Note (Signed)
Transition of Care [facility]) - Progression Note    Patient Details  Name: Gwendolyn Woodward MRN: [id number] Date of Birth: 25-Jun-1966  Transition of Care [facility]) CM/SW [place], RN Phone Number: 04/25/2020, 12:14 PM  Clinical Narrative:    Case Management spoke with [facility], Gayla Medicus, CM and the patient was accepted for care at the facility.  Clinicals were faxed to [facility] [phone].  Gayla Medicus will be visiting the patient to sign her in to the facility.   Once the COVID results are complete - I will assign the bed # and number to call report to at [place].  PTAR will be arranged when this is completed.   Expected Discharge Plan: [place] Barriers to Discharge: Continued Medical Work up, SNF Pending bed offer  Expected Discharge Plan and Services Expected Discharge Plan: [place]   Discharge Planning Services: CM Consult Post Acute Care Choice: [place] Living arrangements for the past 2 months: Homeless Shelter Expected Discharge Date: 04/25/20                                     Social Determinants of Health (SDOH) Interventions    Readmission Risk Interventions Readmission Risk Prevention Plan 04/24/2020  Post Dischage Appt Complete  Medication Screening Complete  Transportation Screening Complete  Some recent data might be hidden
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
## 2020-04-25 NOTE — Care Management Important Message (Signed)
Important Message  Patient Details  Name: Gwendolyn Woodward MRN: [id number] Date of Birth: 01-03-1966   Medicare Important Message Given:  Yes - Important Message mailed due to current National Emergency  Verbal consent obtained due to current National Emergency  Relationship to patient: Self Contact Name: Jmya Uliano Call Date: 04/25/20  Time: 1202 Phone: [phone] Outcome: No Answer/Busy Important Message mailed to: Patient address on file    Delorse Lek 04/25/2020, 12:02 PM

## 2020-04-25 NOTE — Discharge Summary (Signed)
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 Electronically Signed   By: Keith Rake M.D.   On: 04/19/2020 19:03      Procedures:  08/29. External fixation of right pilon fracture using Delta frame construct across the ankle joint  09/01 Open reduction internal fixation of right pilon fracture Removal of external fixator from right ankle Stress examination of right ankle  Subjective: Patient is feeling better, her right leg pain is improving, no nausea or vomiting, no dyspnea or chest pain.   Discharge Exam: Vitals:   04/25/20 0331 04/25/20 0900  BP: 126/67 (!) 149/70  Pulse: 79 81  Resp: 17 18  Temp: 98.7 F (37.1 C) 98.8 F (37.1 C)  SpO2: 100% 100%   Vitals:   04/24/20 0958 04/24/20 2024 04/25/20 0331 04/25/20 0900  BP: 129/63 (!) 145/74 126/67 (!) 149/70  Pulse: 84 79 79 81  Resp: 18 17 17 18   Temp: 98.3 F (36.8 C) 98.2 F (36.8 C) 98.7 F (37.1 C) 98.8 F (37.1 C)  TempSrc: Oral Oral Oral Oral  SpO2: 100% 100% 100% 100%  Weight:      Height:        General: Not in pain or dyspnea Neurology: Awake and alert, non focal  E ENT: no pallor, no icterus, oral mucosa moist Cardiovascular: No JVD. S1-S2 present, rhythmic, no gallops, rubs, or murmurs. No lower extremity  edema. Pulmonary: positive breath sounds bilaterally, no wheezing, rhonchi or rales Gastrointestinal. Abdomen soft and non tender Skin. No rashes Musculoskeletal: right leg with dressing in place.   The results of significant diagnostics from this hospitalization (including imaging, microbiology, ancillary and laboratory) are listed below for reference.     Microbiology: Recent Results (from the past 240 hour(s))  SARS Coronavirus 2 by RT PCR (hospital order, performed in [facility] hospital lab) Nasopharyngeal Nasopharyngeal Swab     Status: None   Collection Time: 04/19/20  2:49 PM   Specimen: Nasopharyngeal Swab  Result Value Ref Range Status   SARS Coronavirus 2 NEGATIVE NEGATIVE Final    Comment: (NOTE) SARS-CoV-2 target nucleic acids are NOT DETECTED.  The SARS-CoV-2 RNA is generally detectable in upper and lower respiratory specimens during the acute phase of infection. The lowest concentration of SARS-CoV-2 viral copies this assay can detect is 250 copies / mL. A negative result does not preclude SARS-CoV-2 infection and should not be used as the sole basis for treatment or other patient management decisions.  A negative result may occur with improper specimen collection / handling, submission of specimen other than nasopharyngeal swab, presence of viral mutation(s) within the areas targeted by this assay, and inadequate number of viral copies (<250 copies / mL). A negative result must  be combined with clinical observations, patient history, and epidemiological information.  Fact Sheet for Patients:   StrictlyIdeas.no  Fact Sheet for Healthcare Providers: BankingDealers.co.za  This test is not yet approved or  cleared by the [place] FDA and has been authorized for detection and/or diagnosis of SARS-CoV-2 by FDA under an Emergency Use Authorization (EUA).  This EUA will remain in effect (meaning this test can be used)  for the duration of the COVID-19 declaration under Section 564(b)(1) of the Act, 21 U.S.C. section 360bbb-3(b)(1), unless the authorization is terminated or revoked sooner.  Performed at [facility], [place] [address], [place], [place] [address]   Surgical pcr screen     Status: None   Collection Time: 04/23/20  6:01 AM   Specimen: Nasal Mucosa; Nasal Swab  Result Value Ref Range Status   MRSA, PCR NEGATIVE NEGATIVE Final   Staphylococcus aureus NEGATIVE NEGATIVE Final    Comment: (NOTE) The Xpert SA Assay (FDA approved for NASAL specimens in patients 57 years of age and older), is one component of a comprehensive surveillance program. It is not intended to diagnose infection nor to guide or monitor treatment. Performed at [facility], [place] [address] [place], [place] [address]      Labs: BNP (last 3 results) No results for input(s): BNP in the last 8760 hours. Basic Metabolic Panel: Recent Labs  Lab 04/21/20 0838 04/22/20 0811 04/23/20 0559 04/24/20 0647 04/25/20 0408  NA 137 140 139 140 142  K 3.9 4.5 4.6 4.2 4.2  CL 102 108 107 107 110  CO2 22 23 24 24 23   GLUCOSE 120* 119* 100* 110* 115*  BUN 39* 36* 24* 22* 21*  CREATININE 3.14* 1.72* 1.05* 0.89 1.01*  CALCIUM 8.6* 8.2* 8.7* 8.5* 8.5*   Liver Function Tests: Recent Labs  Lab 04/19/20 1403 04/20/20 0704 04/22/20 0811  AST 16 22 18   ALT 15 17 7   ALKPHOS 95 86 68  BILITOT 0.3 0.2* 0.4  PROT 6.3* 6.3* 5.4*  ALBUMIN 3.1* 3.0* 2.6*   No results for input(s): LIPASE, AMYLASE in the last 168 hours. No results for input(s): AMMONIA in the last 168 hours. CBC: Recent Labs  Lab 04/19/20 1403 04/19/20 1419 04/21/20 0838 04/22/20 0811 04/23/20 0559 04/24/20 0647 04/25/20 0408  WBC 10.2   < > 8.2 6.4 6.6 8.1 6.7  NEUTROABS 6.6  --   --  4.0  --   --   --   HGB 11.2*   < > 8.9* 7.8* 9.1* 8.1* 8.3*  HCT 36.8   < > 30.0* 25.6* 30.1* 26.2* 26.8*  MCV 82.5   < > 84.3 84.2 84.3 82.9 85.4  PLT 156    < > 167 172 158 150 160   < > = values in this interval not displayed.   Cardiac Enzymes: Recent Labs  Lab 04/21/20 1113  CKTOTAL 724*   BNP: Invalid input(s): POCBNP CBG: Recent Labs  Lab 04/24/20 0641 04/24/20 1110 04/24/20 1621 04/24/20 2025 04/25/20 0635  GLUCAP 105* 93 110* 111* 110*   D-Dimer No results for input(s): DDIMER in the last 72 hours. Hgb A1c No results for input(s): HGBA1C in the last 72 hours. Lipid Profile No results for input(s): CHOL, HDL, LDLCALC, TRIG, CHOLHDL, LDLDIRECT in the last 72 hours. Thyroid function studies No results for input(s): TSH, T4TOTAL, T3FREE, THYROIDAB in the last 72 hours.  Invalid input(s): FREET3 Anemia work up No results for input(s): VITAMINB12, FOLATE, FERRITIN, TIBC, IRON, RETICCTPCT  in the last 72 hours. Urinalysis    Component Value Date/Time   COLORURINE YELLOW 04/19/2020 1718   APPEARANCEUR CLEAR 04/19/2020 1718   LABSPEC >1.046 (H) 04/19/2020 1718   PHURINE 5.0 04/19/2020 1718   GLUCOSEU NEGATIVE 04/19/2020 1718   HGBUR NEGATIVE 04/19/2020 1718   BILIRUBINUR NEGATIVE 04/19/2020 1718   KETONESUR 5 (A) 04/19/2020 1718   PROTEINUR NEGATIVE 04/19/2020 1718   UROBILINOGEN 0.2 10/17/2013 1113   NITRITE NEGATIVE 04/19/2020 1718   LEUKOCYTESUR NEGATIVE 04/19/2020 1718   Sepsis Labs Invalid input(s): PROCALCITONIN,  WBC,  LACTICIDVEN Microbiology Recent Results (from the past 240 hour(s))  SARS Coronavirus 2 by RT PCR (hospital order, performed in [facility] hospital lab) Nasopharyngeal Nasopharyngeal Swab     Status: None   Collection Time: 04/19/20  2:49 PM   Specimen: Nasopharyngeal Swab  Result Value Ref Range Status   SARS Coronavirus 2 NEGATIVE NEGATIVE Final    Comment: (NOTE) SARS-CoV-2 target nucleic acids are NOT DETECTED.  The SARS-CoV-2 RNA is generally detectable in upper and lower respiratory specimens during the acute phase of infection. The lowest concentration of SARS-CoV-2 viral copies  this assay can detect is 250 copies / mL. A negative result does not preclude SARS-CoV-2 infection and should not be used as the sole basis for treatment or other patient management decisions.  A negative result may occur with improper specimen collection / handling, submission of specimen other than nasopharyngeal swab, presence of viral mutation(s) within the areas targeted by this assay, and inadequate number of viral copies (<250 copies / mL). A negative result must be combined with clinical observations, patient history, and epidemiological information.  Fact Sheet for Patients:   StrictlyIdeas.no  Fact Sheet for Healthcare Providers: BankingDealers.co.za  This test is not yet approved or  cleared by the [place] FDA and has been authorized for detection and/or diagnosis of SARS-CoV-2 by FDA under an Emergency Use Authorization (EUA).  This EUA will remain in effect (meaning this test can be used) for the duration of the COVID-19 declaration under Section 564(b)(1) of the Act, 21 U.S.C. section 360bbb-3(b)(1), unless the authorization is terminated or revoked sooner.  Performed at [facility], [place] [address], [place], [place] [address]   Surgical pcr screen     Status: None   Collection Time: 04/23/20  6:01 AM   Specimen: Nasal Mucosa; Nasal Swab  Result Value Ref Range Status   MRSA, PCR NEGATIVE NEGATIVE Final   Staphylococcus aureus NEGATIVE NEGATIVE Final    Comment: (NOTE) The Xpert SA Assay (FDA approved for NASAL specimens in patients 15 years of age and older), is one component of a comprehensive surveillance program. It is not intended to diagnose infection nor to guide or monitor treatment. Performed at [facility], [place] [address], [place], [place] [address]      Time coordinating discharge: 45 minutes  SIGNED:   Tawni Millers, MD  [facility] 04/25/2020, 11:01 AM

## 2020-04-25 NOTE — Plan of Care (Signed)
  Problem: Activity: Goal: Risk for activity intolerance will decrease Outcome: Progressing   

## 2020-04-25 NOTE — Progress Notes (Signed)
Patient discharged to SNF via [facility] at 1745 on 04/25/20.  All belongings sent with patient.

## 2020-08-29 ENCOUNTER — Other Ambulatory Visit: Payer: Self-pay

## 2020-08-29 ENCOUNTER — Other Ambulatory Visit: Payer: Medicare Other

## 2020-08-29 DIAGNOSIS — Z20822 Contact with and (suspected) exposure to covid-19: Secondary | ICD-10-CM

## 2020-09-02 LAB — NOVEL CORONAVIRUS, NAA: SARS-CoV-2, NAA: NOT DETECTED

## 2020-09-25 ENCOUNTER — Other Ambulatory Visit: Payer: Self-pay | Admitting: Nurse Practitioner

## 2020-09-25 DIAGNOSIS — G43909 Migraine, unspecified, not intractable, without status migrainosus: Secondary | ICD-10-CM

## 2020-09-25 DIAGNOSIS — R413 Other amnesia: Secondary | ICD-10-CM

## 2020-10-14 ENCOUNTER — Ambulatory Visit
Admission: RE | Admit: 2020-10-14 | Discharge: 2020-10-14 | Disposition: A | Discharge status: Home / Self Care | Payer: Medicare Other | Source: Ambulatory Visit | Attending: Nurse Practitioner | Admitting: Nurse Practitioner

## 2020-10-14 DIAGNOSIS — G43909 Migraine, unspecified, not intractable, without status migrainosus: Secondary | ICD-10-CM

## 2020-10-14 DIAGNOSIS — R413 Other amnesia: Secondary | ICD-10-CM

## 2020-10-17 ENCOUNTER — Other Ambulatory Visit: Payer: Medicare Other

## 2021-01-27 ENCOUNTER — Ambulatory Visit (INDEPENDENT_AMBULATORY_CARE_PROVIDER_SITE_OTHER): Payer: Medicare Other | Admitting: Podiatry

## 2021-01-27 ENCOUNTER — Ambulatory Visit (INDEPENDENT_AMBULATORY_CARE_PROVIDER_SITE_OTHER): Payer: Medicare Other

## 2021-01-27 ENCOUNTER — Other Ambulatory Visit: Payer: Self-pay

## 2021-01-27 DIAGNOSIS — M19071 Primary osteoarthritis, right ankle and foot: Secondary | ICD-10-CM

## 2021-01-27 DIAGNOSIS — T8484XA Pain due to internal orthopedic prosthetic devices, implants and grafts, initial encounter: Secondary | ICD-10-CM

## 2021-01-27 DIAGNOSIS — M79671 Pain in right foot: Secondary | ICD-10-CM

## 2021-01-29 ENCOUNTER — Encounter: Payer: Self-pay | Admitting: Podiatry
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
## 2021-02-17 ENCOUNTER — Ambulatory Visit: Payer: Medicare Other | Admitting: Podiatry

## 2021-05-28 ENCOUNTER — Other Ambulatory Visit: Payer: Self-pay | Admitting: Student in an Organized Health Care Education/Training Program

## 2021-05-28 DIAGNOSIS — Z1231 Encounter for screening mammogram for malignant neoplasm of breast: Secondary | ICD-10-CM

## 2021-07-09 ENCOUNTER — Other Ambulatory Visit: Payer: Self-pay

## 2021-07-09 ENCOUNTER — Encounter: Payer: Self-pay | Admitting: Adult Health

## 2021-07-09 ENCOUNTER — Ambulatory Visit (INDEPENDENT_AMBULATORY_CARE_PROVIDER_SITE_OTHER): Payer: Medicare Other | Admitting: Adult Health

## 2021-07-09 VITALS — BP 155/100 | HR 77 | Ht 61.0 in | Wt 251.2 lb

## 2021-07-09 DIAGNOSIS — I10 Essential (primary) hypertension: Secondary | ICD-10-CM

## 2021-07-09 DIAGNOSIS — N3946 Mixed incontinence: Secondary | ICD-10-CM

## 2021-07-09 MED ORDER — AMLODIPINE BESYLATE 5 MG PO TABS: 5.0000 mg | ORAL_TABLET | Freq: Every day | ORAL | 3 refills | Status: DC

## 2021-07-09 MED ORDER — OXYBUTYNIN CHLORIDE ER 10 MG PO TB24: 10.0000 mg | ORAL_TABLET | Freq: Every day | ORAL | 2 refills | Status: DC

## 2021-07-09 NOTE — Progress Notes (Deleted)
  Subjective:     Patient ID: Gwendolyn Woodward, female   DOB: Mar 25, 1966, 55 y.o.   MRN: [id number]  HPI  .cwhp Review of Systems     Objective:   Physical Exam     Assessment:     ***    Plan:     ***

## 2021-07-09 NOTE — Progress Notes (Signed)
  Subjective:     Patient ID: Gwendolyn Woodward, female   DOB: 05/19/1966, 55 y.o.   MRN: [id number]  HPI Gwendolyn Woodward is a 55 year old black female, divorced, PM in complaining of leaking urine, also has leakage if coughs or sneezes or has has to go. She wants to lose some weight too, but needs BP to be better controlled first, before trying weight loss meds. PCP is [facility] in [place].  Lab Results  Component Value Date   DIAGPAP  05/04/2019    NEGATIVE FOR INTRAEPITHELIAL LESIONS OR MALIGNANCY.   DIAGPAP TRICHOMONAS VAGINALIS PRESENT. 05/04/2019   HPV NOT Detected 05/04/2019    Review of Systems +urine leakage, and when coughs and sneezes and when has to go Not sexually active. Denies any burning with urination    Reviewed past medical,surgical, social and family history. Reviewed medications and allergies.  Objective:   Physical Exam BP (!) 155/100 (BP Location: Left Arm, Patient Position: Sitting, Cuff Size: Normal)   Pulse 77   Ht 5\' 1"  (1.549 m)   Wt 251 lb 3.2 oz (113.9 kg)   LMP 10/20/2011   BMI 47.46 kg/m   she has not taken BP meds today,but says it stays up Skin warm and dry.Lungs: clear to ausculation bilaterally. Cardiovascular: regular rate and rhythm.    Pelvic: external genitalia is normal in appearance no lesions, vagina: pale pink with loss of rugae, urethra has no lesions or masses noted, cervix:smooth, uterus: normal size, shape and contour, non tender, no masses felt, adnexa: no masses or tenderness noted. Bladder is non tender and no masses felt.   Upstream - 07/09/21 0321       Pregnancy Intention Screening   Does the patient want to become pregnant in the next year? No    Does the patient's partner want to become pregnant in the next year? No    Would the patient like to discuss contraceptive options today? No      Contraception Wrap Up   Current Method Female Sterilization   postmenopause   End Method Female Sterilization    Contraception Counseling  Provided No            Examination chaperoned by Celene Squibb LPN  Assessment:     1. Urinary incontinence, mixed Will try ditropan first and if not better will refer to urology for PT Meds ordered this encounter  Medications   oxybutynin (DITROPAN XL) 10 MG 24 hr tablet    Sig: Take 1 tablet (10 mg total) by mouth daily.    Dispense:  30 tablet    Refill:  2    Order Specific Question:   Supervising Provider    Answer:   Gwendolyn Woodward, Gwendolyn Woodward [[id number]]   amLODipine (NORVASC) 5 MG tablet    Sig: Take 1 tablet (5 mg total) by mouth daily.    Dispense:  30 tablet    Refill:  3    Order Specific Question:   Supervising Provider    Answer:   Gwendolyn Woodward, Gwendolyn Woodward [[id number]]     2. Hypertension, unspecified type Continue Zestoretic and will add Norvasc, has appt in December with PCP Review DASH diet     Plan:     Follow up in 8 weeks for ROS

## 2021-07-25 IMAGING — RF DG ANKLE COMPLETE 3+V*R*
1 series · 7 of 7 positions shown · IV contrast (agent unspecified)
Comparison: April 19, 2020.

CLINICAL DATA: Open reduction internal fixation of right tibial and
fibular fractures.

EXAM:
DG C-ARM 1-60 MIN; RIGHT ANKLE - COMPLETE 3+ VIEW
CONTRAST:  None.
FLUOROSCOPY TIME:  Radiation Exposure Index (if provided by the
fluoroscopic device): 3.73 mGy.

[Series 1: run · 7 of 7 slices shown]
[im 1/7]
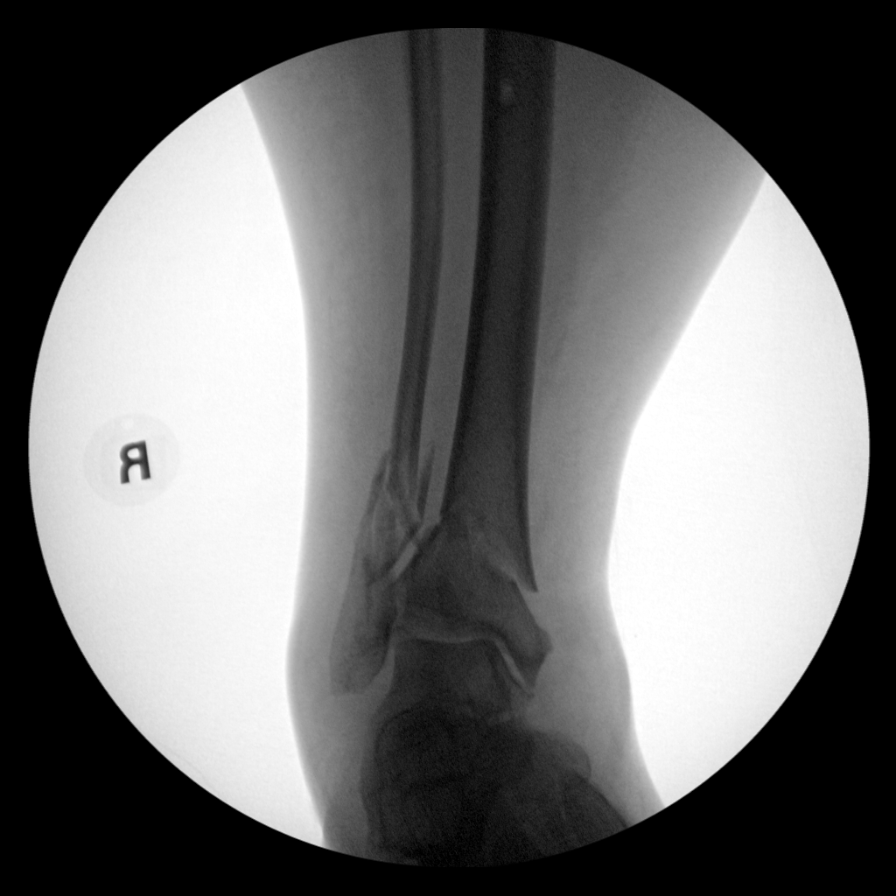
[im 2/7]
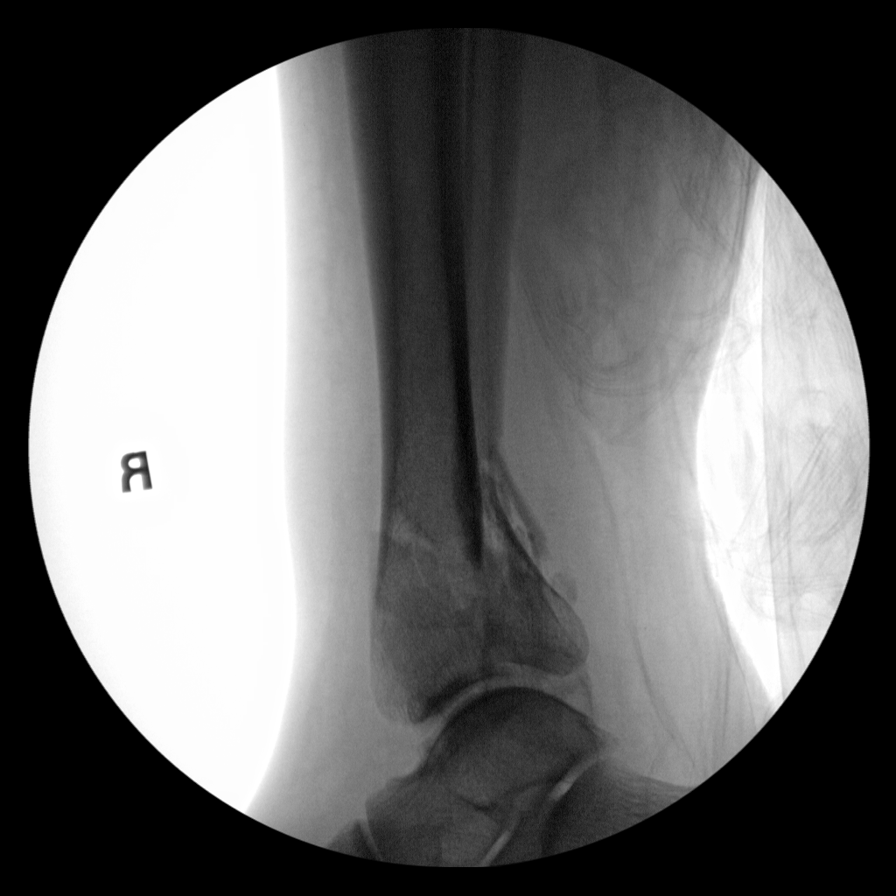
[im 3/7]
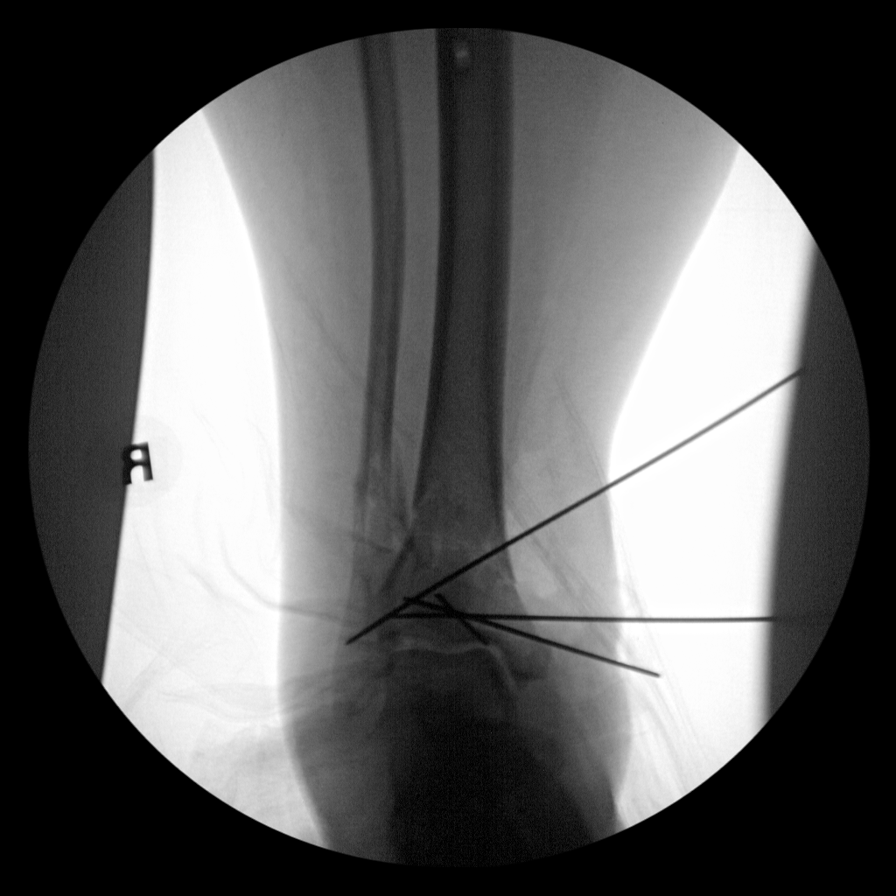
[im 4/7]
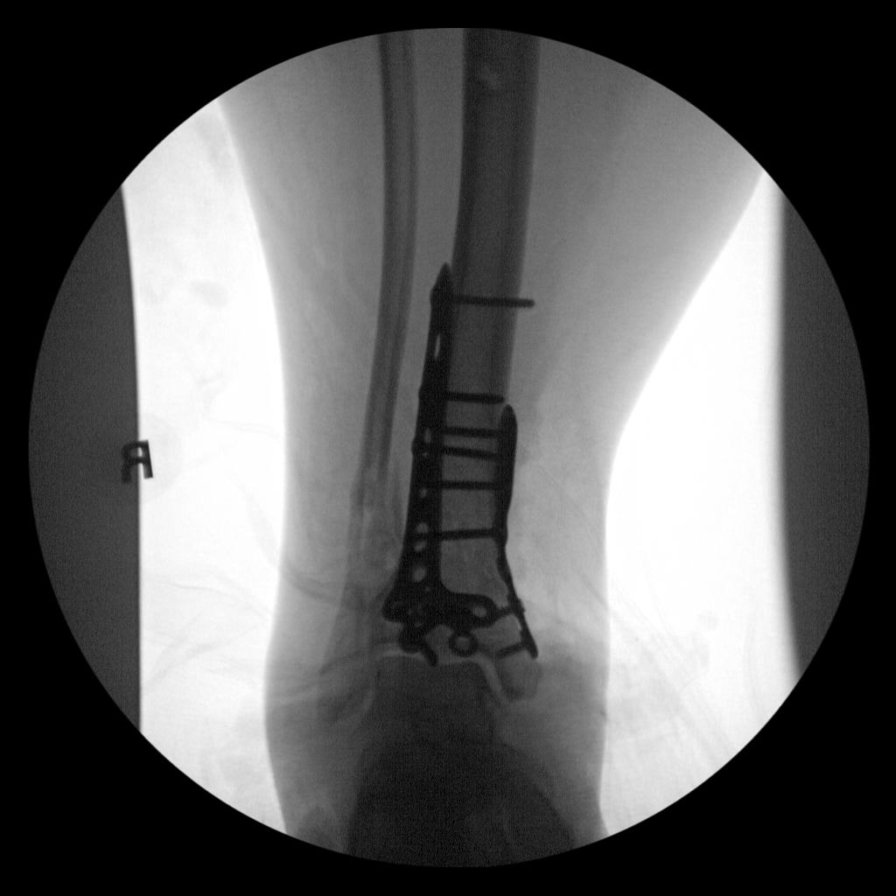
[im 5/7]
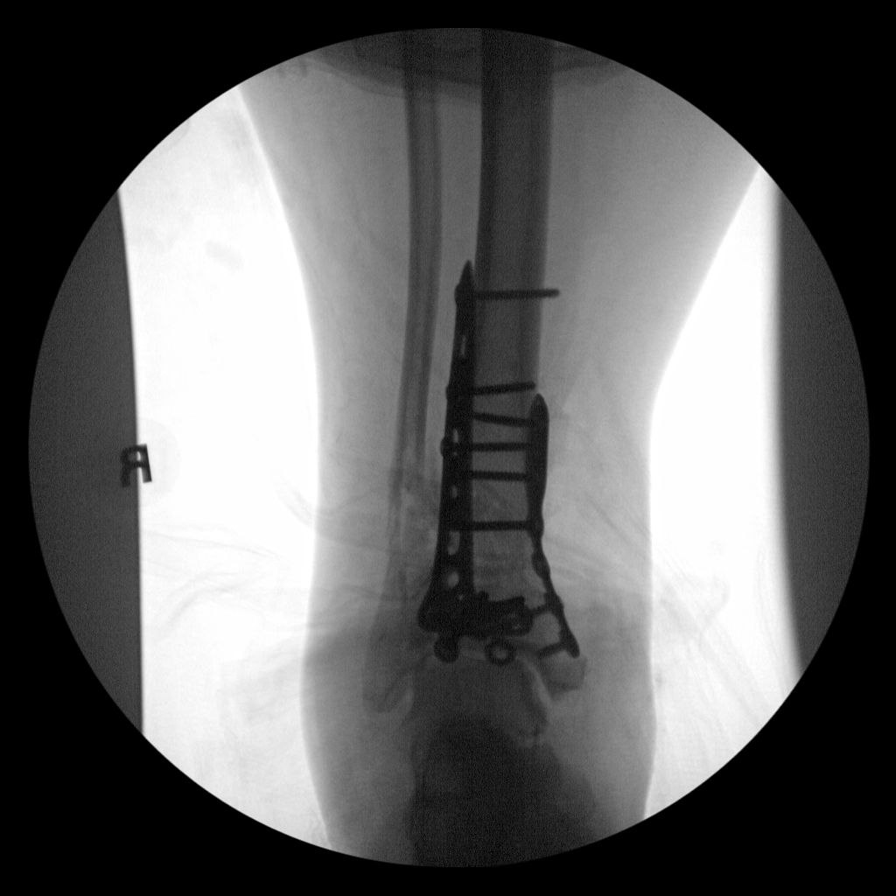
[im 6/7]
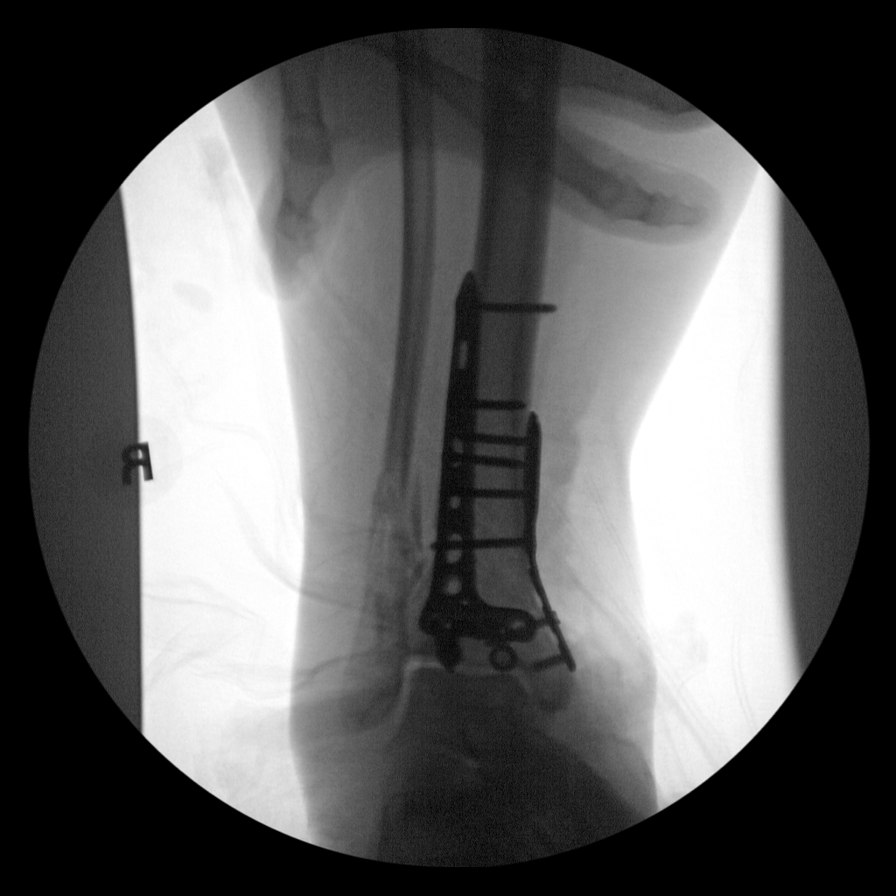
[im 7/7]
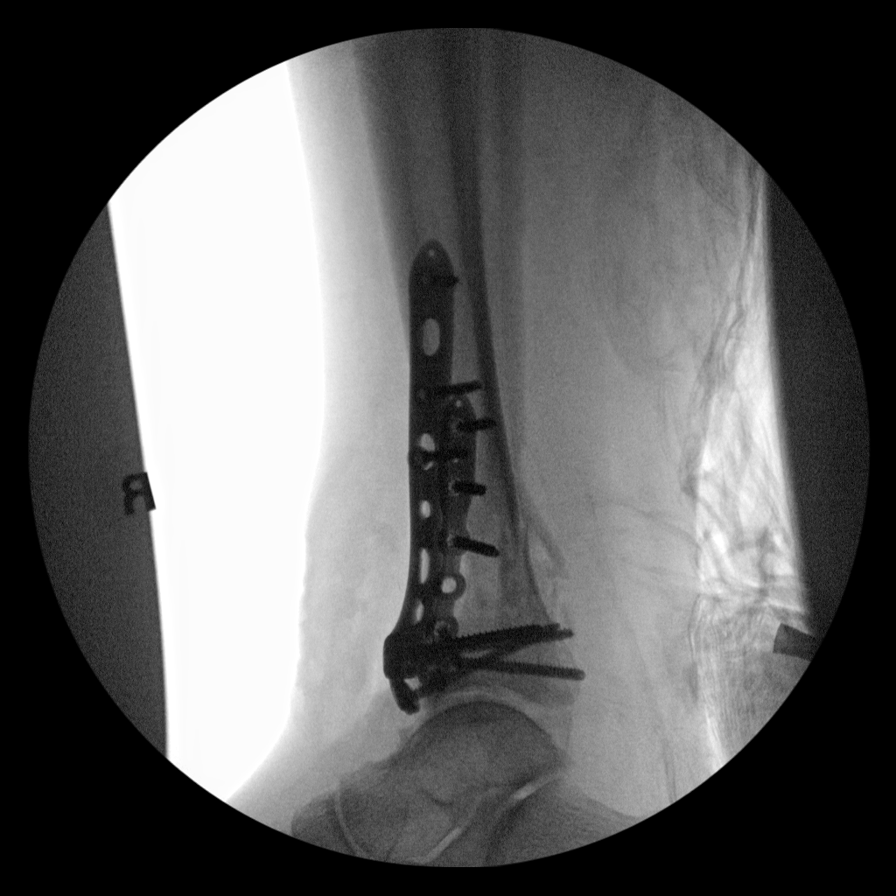

[7 of 7 positions shown; findings below may reference images not displayed]

FINDINGS: Seven intraoperative fluoroscopic images were obtained of the right
ankle. These images demonstrate surgical internal fixation of distal
right tibial fracture. Good alignment of fracture components is
noted.
IMPRESSION: Status post surgical internal fixation of distal right tibial
fracture.

## 2021-07-25 IMAGING — CR DG ANKLE PORT 2V*R*
3 series · 3 of 3 positions shown · non-contrast
Comparison: Intraoperative x-ray earlier same day and prior
examinations.

CLINICAL DATA: Postop day 0 ORIF complex comminuted distal RIGHT
tibia fracture.

EXAM:
PORTABLE RIGHT ANKLE - 2 VIEW

[AP (1 of 2)]
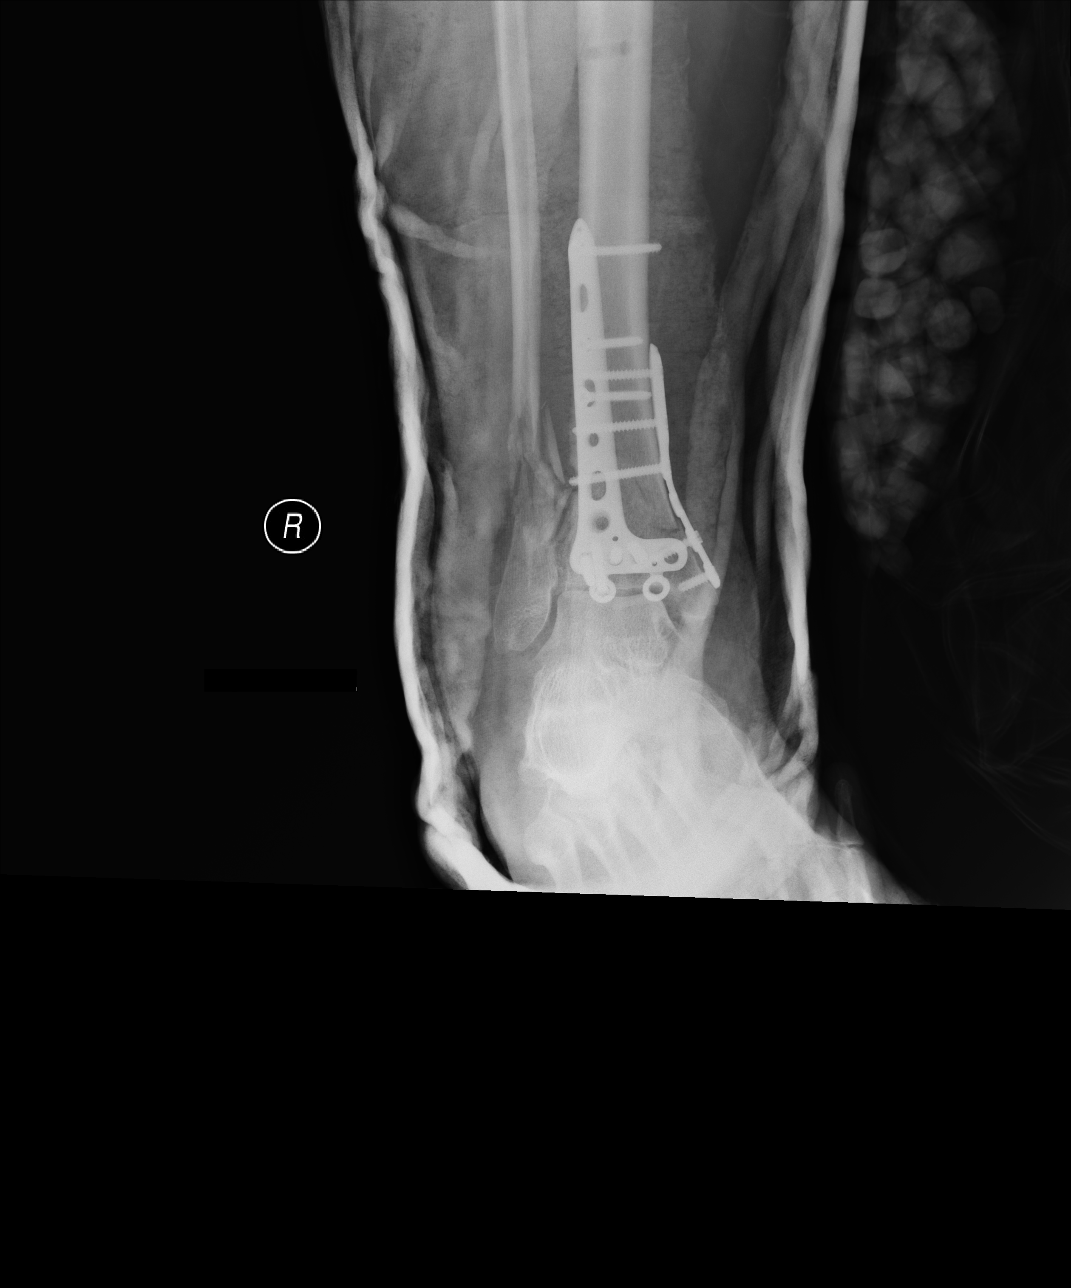

[AP (2 of 2)]
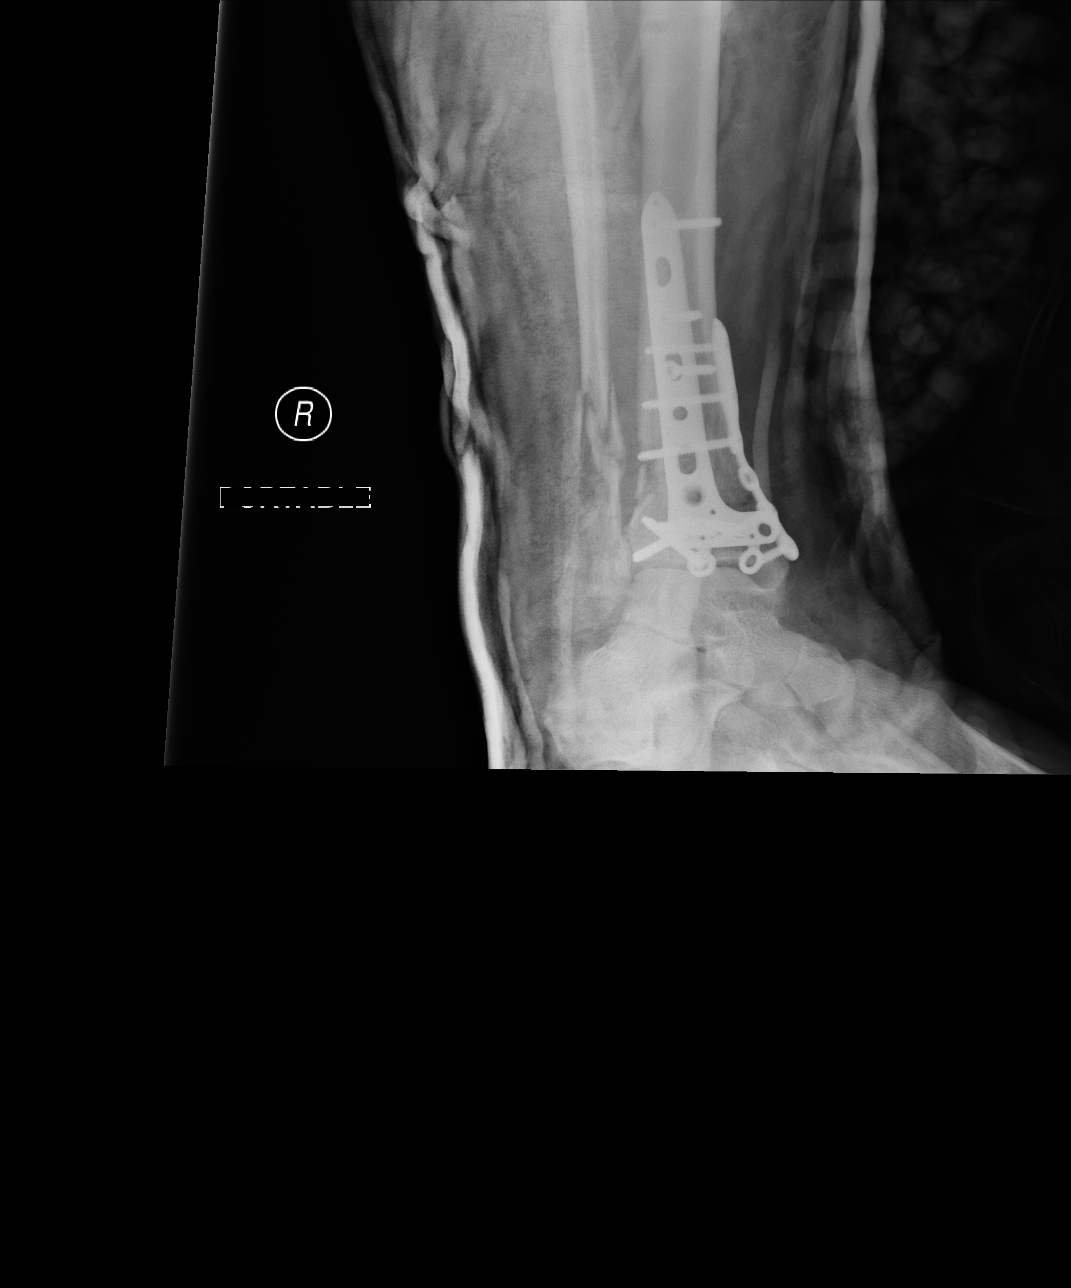

[lateral]
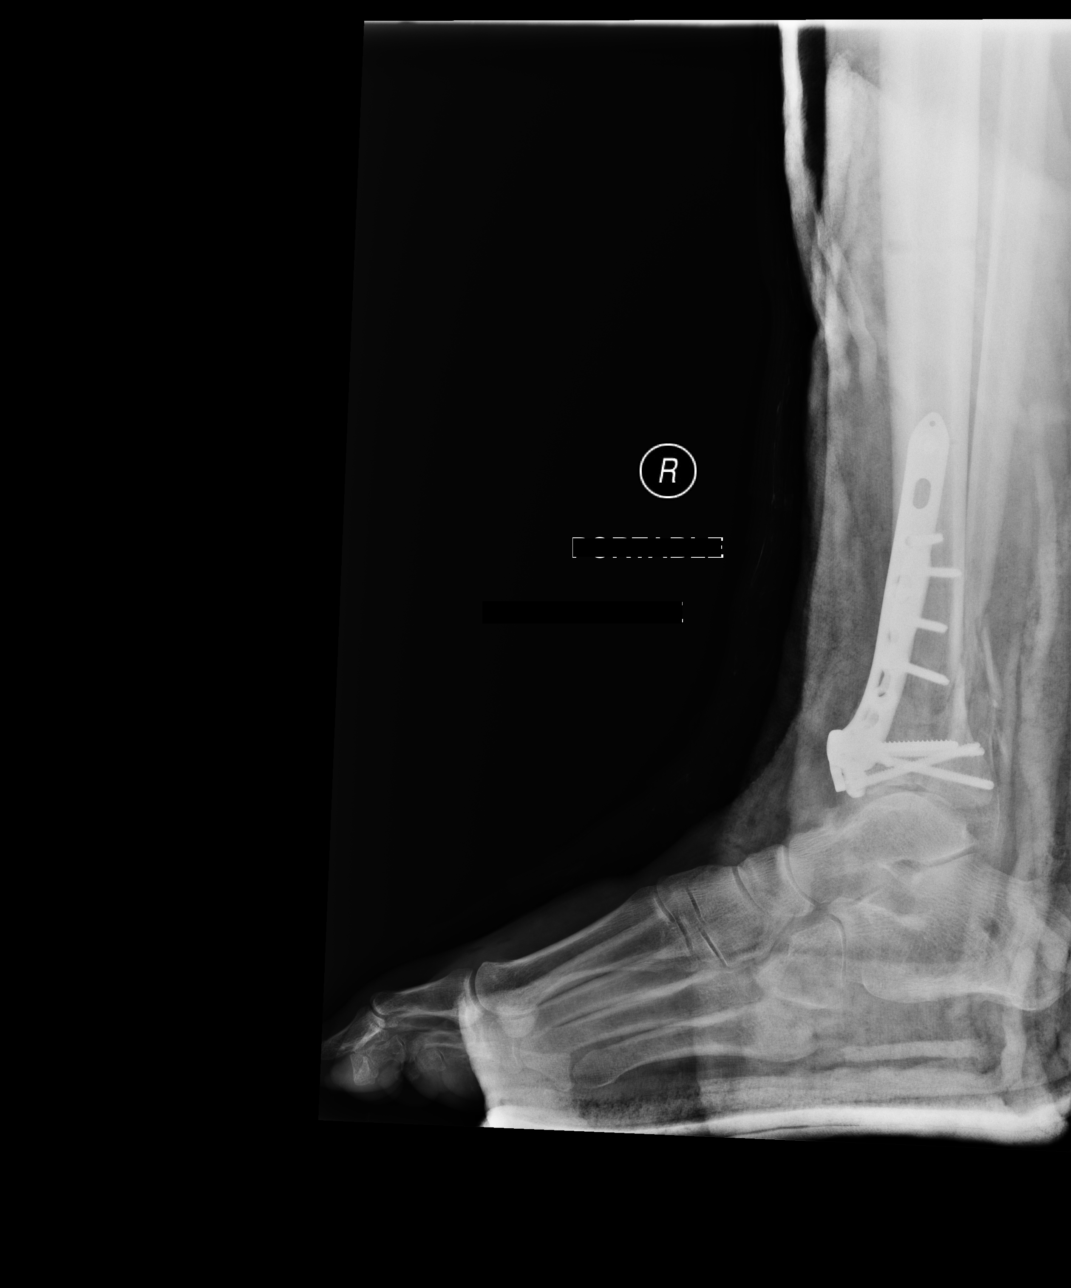

[3 of 3 positions shown; findings below may reference images not displayed]

FINDINGS: Plate and screw fixation of the complex, comminuted intra-articular
distal tibia fracture with anatomic alignment of the ankle joint and
near anatomic alignment of the fracture fragments. No complicating
features. Comminuted distal RIGHT fibular fracture again noted with
near anatomic alignment of the fracture fragments.
IMPRESSION: 1. Near anatomic alignment post ORIF of the complex, comminuted
intra-articular distal tibia fracture without complicating features.
2. Near anatomic alignment of the comminuted distal RIGHT fibular
fracture.

## 2021-07-27 ENCOUNTER — Other Ambulatory Visit: Payer: Self-pay | Admitting: Family Medicine

## 2021-07-27 DIAGNOSIS — M25511 Pain in right shoulder: Secondary | ICD-10-CM

## 2021-09-03 ENCOUNTER — Ambulatory Visit: Payer: Commercial Managed Care - HMO | Admitting: Adult Health

## 2021-09-04 ENCOUNTER — Ambulatory Visit: Payer: Medicare Other

## 2021-09-10 ENCOUNTER — Other Ambulatory Visit: Payer: Self-pay

## 2021-09-10 ENCOUNTER — Encounter: Payer: Self-pay | Admitting: Adult Health

## 2021-09-10 ENCOUNTER — Ambulatory Visit (INDEPENDENT_AMBULATORY_CARE_PROVIDER_SITE_OTHER): Payer: Commercial Managed Care - HMO | Admitting: Adult Health

## 2021-09-10 VITALS — BP 160/100 | HR 85 | Ht 61.0 in | Wt 256.8 lb

## 2021-09-10 DIAGNOSIS — I10 Essential (primary) hypertension: Secondary | ICD-10-CM

## 2021-09-10 DIAGNOSIS — N3946 Mixed incontinence: Secondary | ICD-10-CM | POA: Diagnosis not present

## 2021-09-10 MED ORDER — AMLODIPINE BESYLATE 10 MG PO TABS
10.0000 mg | ORAL_TABLET | Freq: Every day | ORAL | 3 refills | Status: DC
Start: 2021-09-10 — End: 2021-11-04

## 2021-09-10 MED ORDER — OXYBUTYNIN CHLORIDE ER 10 MG PO TB24: 10.0000 mg | ORAL_TABLET | Freq: Every day | ORAL | 12 refills | Status: DC

## 2021-09-10 NOTE — Progress Notes (Signed)
°  Subjective:     Patient ID: Gwendolyn Woodward, female   DOB: [birth year], 56 y.o.   MRN: [id number]  HPI Gwendolyn Woodward is 56 year old black female divorced, PM back in follow up on BP and leaking urine. Ditropan has helped a lot. She saw PCP in December, BP elevated there too. She has seen nutritionist and trying to eat better and walk more. PCP is Dr Lavonia Drafts.  Lab Results  Component Value Date   DIAGPAP  05/04/2019    NEGATIVE FOR INTRAEPITHELIAL LESIONS OR MALIGNANCY.   DIAGPAP TRICHOMONAS VAGINALIS PRESENT. 05/04/2019   HPV NOT Detected 05/04/2019    Review of Systems Can't lose weight Denies any headaches  Reviewed past medical,surgical, social and family history. Reviewed medications and allergies.     Objective:   Physical Exam BP (!) 160/100 (BP Location: Right Arm, Patient Position: Sitting, Cuff Size: Large)    Pulse 85    Ht 5\' 1"  (1.549 m)    Wt 256 lb 12.8 oz (116.5 kg)    LMP 10/20/2011    BMI 48.52 kg/m     Skin warm and dry.  Lungs: clear to ausculation bilaterally. Cardiovascular: regular rate and rhythm.   Upstream - 09/10/21 1046       Pregnancy Intention Screening   Does the patient want to become pregnant in the next year? No    Does the patient's partner want to become pregnant in the next year? No    Would the patient like to discuss contraceptive options today? No      Contraception Wrap Up   Current Method Female Sterilization    End Method Female Sterilization    Contraception Counseling Provided No             Assessment:     1. Hypertension, unspecified type Will increase Norvasc Meds ordered this encounter  Medications   oxybutynin (DITROPAN XL) 10 MG 24 hr tablet    Sig: Take 1 tablet (10 mg total) by mouth daily.    Dispense:  30 tablet    Refill:  12    Order Specific Question:   Supervising Provider    Answer:   Tania Ade H [[id number]]   amLODipine (NORVASC) 10 MG tablet    Sig: Take 1 tablet (10 mg total) by mouth daily.    Dispense:  30  tablet    Refill:  3    Order Specific Question:   Supervising Provider    Answer:   Tania Ade H [[id number]]   Continue Zestoretic   2. Urinary incontinence, mixed Will refill ditropan   3. Morbid obesity ([place]) Keep walking and try to lose weight, has gotten smaller plate    Plan:     Follow up in 8 weeks for BP check with me

## 2021-09-17 ENCOUNTER — Ambulatory Visit: Payer: Medicare Other | Admitting: Nurse Practitioner

## 2021-09-24 ENCOUNTER — Ambulatory Visit (INDEPENDENT_AMBULATORY_CARE_PROVIDER_SITE_OTHER): Payer: Medicare Other | Admitting: Nurse Practitioner

## 2021-09-24 ENCOUNTER — Encounter: Payer: Self-pay | Admitting: Nurse Practitioner

## 2021-09-24 ENCOUNTER — Other Ambulatory Visit: Payer: Self-pay

## 2021-09-24 VITALS — BP 155/94 | HR 82 | Ht 60.0 in | Wt 257.1 lb

## 2021-09-24 DIAGNOSIS — E119 Type 2 diabetes mellitus without complications: Secondary | ICD-10-CM

## 2021-09-24 DIAGNOSIS — N3946 Mixed incontinence: Secondary | ICD-10-CM

## 2021-09-24 DIAGNOSIS — Z8673 Personal history of transient ischemic attack (TIA), and cerebral infarction without residual deficits: Secondary | ICD-10-CM | POA: Diagnosis not present

## 2021-09-24 DIAGNOSIS — Z1329 Encounter for screening for other suspected endocrine disorder: Secondary | ICD-10-CM | POA: Insufficient documentation

## 2021-09-24 DIAGNOSIS — E559 Vitamin D deficiency, unspecified: Secondary | ICD-10-CM

## 2021-09-24 DIAGNOSIS — I10 Essential (primary) hypertension: Secondary | ICD-10-CM

## 2021-09-24 DIAGNOSIS — G4700 Insomnia, unspecified: Secondary | ICD-10-CM

## 2021-09-24 DIAGNOSIS — M25511 Pain in right shoulder: Secondary | ICD-10-CM

## 2021-09-24 DIAGNOSIS — G8929 Other chronic pain: Secondary | ICD-10-CM | POA: Insufficient documentation

## 2021-09-24 DIAGNOSIS — E7800 Pure hypercholesterolemia, unspecified: Secondary | ICD-10-CM

## 2021-09-24 NOTE — Assessment & Plan Note (Signed)
Takes meloxicam 7.5 mg daily. Norco as needed.  Seems like  patient is being followed by pain management.  Patient unable to tell me which doctors she sees today.  Patient told not to take Norco while driving as she reports that this makes her sleepy.

## 2021-09-24 NOTE — Assessment & Plan Note (Signed)
Takes Ambien 5 mg daily as needed.

## 2021-09-24 NOTE — Assessment & Plan Note (Addendum)
States metformin 500 mg XR daily. Takes atorvastatin 10 mg daily She is on ACEI Check A1c before next visit.  Patient is due for diabetic eye exam yearly foot exam.

## 2021-09-24 NOTE — Assessment & Plan Note (Signed)
Takes atorvastatin 10 mg daily she is not on aspirin or Plavix. Will follow up on these at next visit.  Per review of chart from her previous provider patient reported GI bleed with aspirin.  Check lipid panel before next visit.

## 2021-09-24 NOTE — Assessment & Plan Note (Signed)
Takes oxybutynin 10 mg daily. Followed by OB/GYN

## 2021-09-24 NOTE — Assessment & Plan Note (Signed)
DASH diet and commitment to daily physical activity for a minimum of 30 minutes discussed and encouraged, as a part of hypertension management. The importance of attaining a healthy weight is also discussed.  BP/Weight 09/24/2021 09/10/2021 07/09/2021 04/25/2020 04/19/2020 06/21/2019 6/80/8811  Systolic BP 031 594 585 929 - 244 628  Diastolic BP 94 638 177 80 - 85 95  Wt. (Lbs) 257.08 256.8 251.2 - 250 - 219  BMI 50.21 48.52 47.46 - 41.6 41.38 41.38  Patient has been taking amlodipine 5 mg instead of amlodipine 10 mg.   start Taking  amlodipine 10 mg daily Continue lisinopril hydrochlorothiazide 20-25 mg tablet.  1 tablet daily. Follow-up in 4 weeks. CMP ordered .

## 2021-09-24 NOTE — Patient Instructions (Addendum)
Please take amlodipine 10mg  daily for your blood pressure.  Lisinopril -hydrochlorothiazide 20-25mg , take one tablet daily for your blood pressure. Please get your fasting labs done 3-5 days before your next appointment    It is important that you exercise regularly at least 30 minutes 5 times a week.  Think about what you will eat, plan ahead. Choose " clean, green, fresh or frozen" over canned, processed or packaged foods which are more sugary, salty and fatty. 70 to 75% of food eaten should be vegetables and fruit. Three meals at set times with snacks allowed between meals, but they must be fruit or vegetables. Aim to eat over a 12 hour period , example 7 am to 7 pm, and STOP after  your last meal of the day. Drink water,generally about 64 ounces per day, no other drink is as healthy. Fruit juice is best enjoyed in a healthy way, by EATING the fruit.  Thanks for choosing [facility], we consider it a privelige to serve you.

## 2021-09-24 NOTE — Progress Notes (Signed)
New Patient Office Visit  Subjective:  Patient ID: Gwendolyn Woodward, female    DOB: 05/08/1966  Age: 56 y.o. MRN: [id number]  CC:  Chief Complaint  Patient presents with   New Patient (Initial Visit)    NP ankle and shoulder pain chronic pain     HPI Gwendolyn Woodward presents to establish care. She was unable to tell me who her Previous PCP is. Patient stated that she has seen several doctors recently. She came with her medications to  today's visit . she is followed by [facility] podiatry and othopedics.  Followed by Dr. Margo Aye. psychiatry prescribes gabapentin, primidone and Ambien.  Patient will sign a release of information form to obtain her records from Dr. Liana Gerold office   HTN Patient stated that her Norvasc 5mg   was recently increased to 10 mg daily by her OB/GYN.  She has picked up her new RX of amlodipine 10mg   but she is still using amlodipine 5 mg daily, she plans to switch to new RX when she is done with amlodipine 5mg .  Takes lisinopril hydrochlorothiazide 20-25 mg 1 tablet daily.   History of stroke.  atorvastatin 10 mg daily  T2DM.  metformin 500 mg daily  Migraine headache.  States rizatriptan 10 mg as needed, and Goody powder.   Chronic right shoulder pain and right ankle pain.  Takes meloxicam 7.5 mg daily,  Norco as needed.  She is followed by  podiatry.   Insomnia takes Ambien 5 mg daily at bedtime melatonin 5 mg daily as needed.  Med prescribed by psych.   Urinary Incontinence.  Takes Ditropan 10 mg daily.   A greater part of the visit today today was used in educating patient on her medications and telling her what each medication is for.  Patient stated that she has been taking all the different medications but she does not know what each one is for.     Past Medical History:  Diagnosis Date   Arthritis    back pain and knee pain, no meds   Asthma    Chest pain    + dyspnea   CVA (cerebral infarction) 1984 , 1988   x2, left sided weakness,  history of dizziness   Depression with anxiety    History- no meds   Dysphagia    Fasting hyperglycemia    Gastroesophageal reflux disease    no meds   Headache(784.0)    OTC med PRN   History of anemia    S/p transfusions in 1999 at [facility] after vaginal bleeding; normal CBC and 09/2011   Hyperlipidemia    lipid profile in 11/2011: 203, 106, 58, 124   Hypertension    Lab 09/2011: Normal CMet except glucose of 109-169 and albumin-3.4; normal BP 09/2011-12/2011   Sleep apnea    Does not use CPAP - no longer has CPAP machine   Stroke [facility])     Past Surgical History:  Procedure Laterality Date   [place]   x 2   COLONOSCOPY  01/27/2012   Procedure: COLONOSCOPY;  Surgeon: Danie Binder, MD;  [facility];  Service: Endoscopy;  Laterality: N/A;  8:30   ENDOMETRIAL ABLATION  Feb 2013   ESOPHAGOGASTRODUODENOSCOPY  5/11   [facility]-Dr Iva Lento GERD distal esophagus, NEGATIVE bx for [place]   EXTERNAL FIXATION LEG Right 04/19/2020   Procedure: EXTERNAL FIXATION RIGHT ANKLE;  Surgeon: Mcarthur Rossetti, MD;  Location: [place];  Service: Orthopedics;  Laterality:  Right;   IUD REMOVAL  10/20/2011   Procedure: INTRAUTERINE DEVICE (IUD) REMOVAL;  Surgeon: Allena Katz, MD;  Location: [place] [facility];  Service: Gynecology;  Laterality: N/A;   OPEN REDUCTION INTERNAL FIXATION (ORIF) TIBIA/FIBULA FRACTURE Right 04/23/2020   Procedure: OPEN REDUCTION INTERNAL FIXATION (ORIF) TIBIA/FIBULA FRACTURE;  Surgeon: Shona Needles, MD;  Location: [place];  Service: Orthopedics;  Laterality: Right;   TUBAL LIGATION     WISDOM TOOTH EXTRACTION      Family History  Problem Relation Age of Onset   Cancer Father        Lung/Throat   Heart disease Father    Hypertension Mother    Asthma Mother    Diabetes Maternal Uncle    Anesthesia problems Neg Hx     Social History   Socioeconomic History   Marital status: Divorced    Spouse name: Not on file   Number of  children: 2   Years of education: Not on file   Highest education level: Not on file  Occupational History   Occupation: sock boarder    Comment: just got new job in [place] Use   Smoking status: Never   Smokeless tobacco: Never  Vaping Use   Vaping Use: Never used  Substance and Sexual Activity   Alcohol use: Yes    Comment: Socially, 2 times per yr   Drug use: No   Sexual activity: Not Currently    Birth control/protection: Surgical, Post-menopausal, Abstinence    Comment: tubal  Other Topics Concern   Not on file  Social History Narrative   Lives alone-2 grown children   Social Determinants of Health   Financial Resource Strain: Not on file  Food Insecurity: Not on file  Transportation Needs: Not on file  Physical Activity: Not on file  Stress: Not on file  Social Connections: Not on file  Intimate Partner Violence: Not on file    ROS Review of Systems  Constitutional: Negative.   Respiratory: Negative.    Cardiovascular: Negative.   Gastrointestinal: Negative.   Musculoskeletal:  Positive for arthralgias.       Has chronic right shoulder pain and chronic right ankle.   Psychiatric/Behavioral: Negative.     Objective:   Today's Vitals: BP (!) 162/102 (BP Location: Left Arm, Cuff Size: Normal)    Pulse 82    Ht 5' (1.524 m)    Wt 257 lb 1.3 oz (116.6 kg)    LMP 10/20/2011    SpO2 97%    BMI 50.21 kg/m   Physical Exam Constitutional:      General: She is not in acute distress.    Appearance: She is obese. She is not ill-appearing, toxic-appearing or diaphoretic.  Cardiovascular:     Rate and Rhythm: Normal rate and regular rhythm.     Pulses: Normal pulses.     Heart sounds: Normal heart sounds. No murmur heard.   No friction rub. No gallop.  Pulmonary:     Effort: No respiratory distress.     Breath sounds: No stridor. No wheezing, rhonchi or rales.  Chest:     Chest wall: No tenderness.  Abdominal:     Palpations: Abdomen is soft.   Musculoskeletal:        General: Tenderness present.     Comments: Limited range of motion right shoulder, voiced tenderness of right shoulder on palpation.   Neurological:     Mental Status: She is oriented to person, place, and time.  Psychiatric:        Mood and Affect: Mood normal.        Behavior: Behavior normal.        Thought Content: Thought content normal.        Judgment: Judgment normal.    Assessment & Plan:   Problem List Items Addressed This Visit   None   Outpatient Encounter Medications as of 09/24/2021  Medication Sig   ACCU-CHEK GUIDE test strip USE 1 STRIP TO CHECK GLUCOSE AS DIRECTED DAILY   Accu-Chek Softclix Lancets lancets SMARTSIG:Topical   acetaminophen (TYLENOL) 325 MG tablet Take 2 tablets (650 mg total) by mouth every 6 (six) hours as needed for mild pain or moderate pain.   amLODipine (NORVASC) 10 MG tablet Take 1 tablet (10 mg total) by mouth daily.   atorvastatin (LIPITOR) 10 MG tablet Take 10 mg by mouth at bedtime.   Cholecalciferol (D3 2000) 50 MCG (2000 UT) CAPS Take by mouth.   gabapentin (NEURONTIN) 600 MG tablet Take 600 mg by mouth 3 (three) times daily.   lisinopril-hydrochlorothiazide (ZESTORETIC) 20-25 MG tablet Take 1 tablet by mouth daily.   Melatonin 5 MG CHEW Chew by mouth.   metFORMIN (GLUCOPHAGE-XR) 500 MG 24 hr tablet SMARTSIG:1 Tablet(s) By Mouth Every Evening   oxybutynin (DITROPAN XL) 10 MG 24 hr tablet Take 1 tablet (10 mg total) by mouth daily.   pramipexole (MIRAPEX) 0.5 MG tablet SMARTSIG:1 Tablet(s) By Mouth Every Evening   rizatriptan (MAXALT) 10 MG tablet SMARTSIG:1 Tablet(s) By Mouth 1-2 Times Daily   [DISCONTINUED] meloxicam (MOBIC) 7.5 MG tablet Take 7.5 mg by mouth daily.   [DISCONTINUED] metFORMIN (GLUCOPHAGE) 500 MG tablet Take by mouth 2 (two) times daily with a meal.   No facility-administered encounter medications on file as of 09/24/2021.    Follow-up: No follow-ups on file.   Renee Rival, FNP

## 2021-09-24 NOTE — Assessment & Plan Note (Signed)
Takes atorvastatin 10 mg daily. Check lipid before next visit

## 2021-09-25 ENCOUNTER — Other Ambulatory Visit (HOSPITAL_BASED_OUTPATIENT_CLINIC_OR_DEPARTMENT_OTHER): Payer: Self-pay

## 2021-09-25 DIAGNOSIS — G4739 Other sleep apnea: Secondary | ICD-10-CM

## 2021-09-29 ENCOUNTER — Ambulatory Visit
Admission: RE | Admit: 2021-09-29 | Discharge: 2021-09-29 | Disposition: A | Discharge status: Home / Self Care | Payer: Medicare Other | Source: Ambulatory Visit | Attending: Student in an Organized Health Care Education/Training Program | Admitting: Student in an Organized Health Care Education/Training Program

## 2021-09-29 DIAGNOSIS — Z1231 Encounter for screening mammogram for malignant neoplasm of breast: Secondary | ICD-10-CM

## 2021-10-01 ENCOUNTER — Other Ambulatory Visit: Payer: Self-pay | Admitting: Nurse Practitioner

## 2021-10-01 ENCOUNTER — Other Ambulatory Visit: Payer: Self-pay | Admitting: Family Medicine

## 2021-10-01 DIAGNOSIS — M75101 Unspecified rotator cuff tear or rupture of right shoulder, not specified as traumatic: Secondary | ICD-10-CM

## 2021-10-01 DIAGNOSIS — R928 Other abnormal and inconclusive findings on diagnostic imaging of breast: Secondary | ICD-10-CM

## 2021-10-04 ENCOUNTER — Other Ambulatory Visit: Payer: Self-pay | Admitting: Adult Health

## 2021-10-23 ENCOUNTER — Ambulatory Visit: Payer: Medicare Other | Admitting: Nurse Practitioner

## 2021-10-23 ENCOUNTER — Other Ambulatory Visit: Payer: Self-pay

## 2021-10-23 ENCOUNTER — Ambulatory Visit (INDEPENDENT_AMBULATORY_CARE_PROVIDER_SITE_OTHER): Payer: Medicare Other | Admitting: Nurse Practitioner

## 2021-10-23 ENCOUNTER — Encounter: Payer: Self-pay | Admitting: Nurse Practitioner

## 2021-10-23 VITALS — BP 170/100 | HR 77 | Ht 60.0 in | Wt 254.0 lb

## 2021-10-23 DIAGNOSIS — F339 Major depressive disorder, recurrent, unspecified: Secondary | ICD-10-CM | POA: Insufficient documentation

## 2021-10-23 DIAGNOSIS — I10 Essential (primary) hypertension: Secondary | ICD-10-CM | POA: Diagnosis not present

## 2021-10-23 DIAGNOSIS — E119 Type 2 diabetes mellitus without complications: Secondary | ICD-10-CM | POA: Diagnosis not present

## 2021-10-23 MED ORDER — HYDRALAZINE HCL 25 MG PO TABS: 25.0000 mg | ORAL_TABLET | Freq: Three times a day (TID) | ORAL | 2 refills | Status: DC

## 2021-10-23 NOTE — Assessment & Plan Note (Addendum)
A1C today ?Continue metformin 500mg  daily ?Patient states that she has been taking medication as ordered.  ?

## 2021-10-23 NOTE — Assessment & Plan Note (Addendum)
BP Readings from Last 3 Encounters:  ?10/23/21 (!) 170/100  ?09/24/21 (!) 155/94  ?09/10/21 (!) 160/100  ?Takes amlodipine 10 mg daily, lisinopril-hydrochlorothiazide 20-25 mg daily ?Condition uncontrolled ?Start hydralazine 25 mg twice daily, continue amlodipine 10 mg daily lisinopril-hydrochlorothiazide 20-25 mg daily. ?Dash diet advised, increase exercise to lose weight. ?CMP plus EGFR today ?Follow-up in 4 weeks. ?

## 2021-10-23 NOTE — Assessment & Plan Note (Signed)
Patient states that she sometimes feels like jumping off the bridge to harm herself ?Patient currently  denies suicidal ideation at this time. ?Urgent referral to pscy for counseling and medication management ?Currently not taking any medication, patient refused medication today  states that she is taking too many medications already.  States that she was on medications in the past.  ?Patient told to call 911 or go to the ED if she started having thoughts of committing suicide, she verbalized understanding. ?PHQ 9 score 16. ?

## 2021-10-23 NOTE — Patient Instructions (Addendum)
Start taking hydralazine 25mg  two times daily for your blood pressure.  ?Please go to the ER immediately if you have thoughts of harming yourself or anyone else ? ?Please get your eye exam scheduled today  ? ?It is important that you exercise regularly at least 30 minutes 5 times a week.  ?Think about what you will eat, plan ahead. ?Choose " clean, green, fresh or frozen" over canned, processed or packaged foods which are more sugary, salty and fatty. ?70 to 75% of food eaten should be vegetables and fruit. ?Three meals at set times with snacks allowed between meals, but they must be fruit or vegetables. ?Aim to eat over a 12 hour period , example 7 am to 7 pm, and STOP after  your last meal of the day. ?Drink water,generally about 64 ounces per day, no other drink is as healthy. Fruit juice is best enjoyed in a healthy way, by EATING the fruit. ? ?Thanks for choosing [facility], we consider it a privelige to serve you. ? ?

## 2021-10-23 NOTE — Progress Notes (Signed)
? ?  Gwendolyn Woodward     MRN: [id number]      DOB: 06-21-1966 ? ? ?HPI ?[name] with medical history of hypertension, GERD, type 2 diabetes, CVA is here for follow up BP is elevated today , states that her BP is always elevated , states that she has been taking her BP medications as prescribed. She went to the nutritionist, she has been following the instruction given, she has been exercising, goes to the mall , walks for 2-3 hours   ? ?Pt is due for due for covid booster and shingles vaccine. Pt advised to get both vaccines at her pharmacy , she verbalized understanding.  ? ? ?Depression.  Patient states that she is depressed because she has problems with her children , taking too much pills , she is just tired of everything, feels like jumping off the bridge sometimes.  Patient denies current suicidal ideation or homicidal ideation stated that she does not have gone or firearms at home she has been having these thought since she was in her 5's. She has 2 children but they make her mad, ''I don't deal with them'' . People make her mad she tries to avoid people. She was seeing a psych up till last year, her doctor left but they did not find another one for her.  She is currently not taking any medication for depression.  ? ?ROS ?Denies recent fever or chills. ?Denies sinus pressure, nasal congestion, ear pain or sore throat. ?Denies chest congestion, productive cough or wheezing. ?Denies chest pains, palpitations and leg swelling ?Denies abdominal pain, nausea, vomiting,diarrhea or constipation.   ?Denies dysuria, frequency, hesitancy or incontinence. ?Denies joint pain, swelling and limitation in mobility. ?Denies headaches, seizures, numbness, or tingling. ?Denies depression, anxiety or insomnia. ?Denies skin break down or rash. ? ? ?PE ? ?BP (!) 170/100   Pulse 77   Ht 5' (1.524 m)   Wt 254 lb (115.2 kg)   LMP 10/20/2011   SpO2 97%   BMI 49.61 kg/m?  ? ?Patient alert and oriented and in no  cardiopulmonary distress. ? ?Chest: Clear to auscultation bilaterally. ? ?CVS: S1, S2 no murmurs, no S3.Regular rate. ? ?ABD: Soft non tender.  ? ?MS: Adequate ROM spine, shoulders, hips and knees. ? ?Skin: Intact, no ulcerations or rash noted. ? ?Psych: Good eye contact, normal affect. Memory intact not anxious or depressed appearing. ? ?CNS: CN 2-12 intact, power,  normal throughout.no focal deficits noted. ? ? ?Assessment & Plan ? ?Hypertension ?BP Readings from Last 3 Encounters:  ?10/23/21 (!) 170/100  ?09/24/21 (!) 155/94  ?09/10/21 (!) 160/100  ?Takes amlodipine 10 mg daily, lisinopril-hydrochlorothiazide 20-25 mg daily ?Condition uncontrolled ?Start hydralazine 25 mg twice daily, continue amlodipine 10 mg daily lisinopril-hydrochlorothiazide 20-25 mg daily. ?Dash diet advised, increase exercise to lose weight. ?CMP plus EGFR today ? ?Diabetes mellitus ?A1C today ?Continue metformin 500mg   ?Patient states that she has been taking medication as ordered.  ? ?Depression, recurrent (HCC) ?Patient states that she sometimes feels like jumping off the bridge to harm herself ?Patient currently  denies suicidal ideation at this time. ?Urgent referral to pscy for counseling and medication management ?Currently not taking any medication, patient refused medication today  states that she is taking too many medications already.  States that she was on medications in the past.  ?Patient told to call 911 or go to the ED if she started having thoughts of committing suicide, she verbalized understanding. ?PHQ 9 score 16. ? ?

## 2021-10-25 LAB — CBC WITH DIFFERENTIAL/PLATELET
Basophils Absolute: 0 10*3/uL (ref 0.0–0.2)
Basos: 0 %
EOS (ABSOLUTE): 0.1 10*3/uL (ref 0.0–0.4)
Eos: 2 %
Hematocrit: 41.5 % (ref 34.0–46.6)
Hemoglobin: 13.6 g/dL (ref 11.1–15.9)
Immature Grans (Abs): 0 10*3/uL (ref 0.0–0.1)
Immature Granulocytes: 0 %
Lymphocytes Absolute: 2.5 10*3/uL (ref 0.7–3.1)
Lymphs: 37 %
MCH: 25.4 pg — ABNORMAL LOW (ref 26.6–33.0)
MCHC: 32.8 g/dL (ref 31.5–35.7)
MCV: 77 fL — ABNORMAL LOW (ref 79–97)
Monocytes Absolute: 0.4 10*3/uL (ref 0.1–0.9)
Monocytes: 6 %
Neutrophils Absolute: 3.7 10*3/uL (ref 1.4–7.0)
Neutrophils: 55 %
Platelets: 228 10*3/uL (ref 150–450)
RBC: 5.36 x10E6/uL — ABNORMAL HIGH (ref 3.77–5.28)
RDW: 14.6 % (ref 11.7–15.4)
WBC: 6.9 10*3/uL (ref 3.4–10.8)

## 2021-10-25 LAB — CMP14+EGFR
ALT: 16 IU/L (ref 0–32)
AST: 14 IU/L (ref 0–40)
Albumin/Globulin Ratio: 1.4 (ref 1.2–2.2)
Albumin: 4.6 g/dL (ref 3.8–4.9)
Alkaline Phosphatase: 154 IU/L — ABNORMAL HIGH (ref 44–121)
BUN/Creatinine Ratio: 23 (ref 9–23)
BUN: 24 mg/dL (ref 6–24)
Bilirubin Total: 0.3 mg/dL (ref 0.0–1.2)
CO2: 21 mmol/L (ref 20–29)
Calcium: 10.4 mg/dL — ABNORMAL HIGH (ref 8.7–10.2)
Chloride: 98 mmol/L (ref 96–106)
Creatinine, Ser: 1.05 mg/dL — ABNORMAL HIGH (ref 0.57–1.00)
Globulin, Total: 3.2 g/dL (ref 1.5–4.5)
Glucose: 100 mg/dL — ABNORMAL HIGH (ref 70–99)
Potassium: 4.1 mmol/L (ref 3.5–5.2)
Sodium: 143 mmol/L (ref 134–144)
Total Protein: 7.8 g/dL (ref 6.0–8.5)
eGFR: 62 mL/min/{1.73_m2} (ref >59)

## 2021-10-25 LAB — HEMOGLOBIN A1C
Est. average glucose Bld gHb Est-mCnc: 137 mg/dL
Hgb A1c MFr Bld: 6.4 % — ABNORMAL HIGH (ref 4.8–5.6)

## 2021-10-25 LAB — LIPID PANEL
Chol/HDL Ratio: 2.9 ratio (ref 0.0–4.4)
Cholesterol, Total: 212 mg/dL — ABNORMAL HIGH (ref 100–199)
HDL: 73 mg/dL (ref >39)
LDL Chol Calc (NIH): 118 mg/dL — ABNORMAL HIGH (ref 0–99)
Triglycerides: 119 mg/dL (ref 0–149)
VLDL Cholesterol Cal: 21 mg/dL (ref 5–40)

## 2021-10-25 LAB — VITAMIN D 25 HYDROXY (VIT D DEFICIENCY, FRACTURES): Vit D, 25-Hydroxy: 25.2 ng/mL — ABNORMAL LOW (ref 30.0–100.0)

## 2021-10-25 LAB — TSH: TSH: 0.948 u[IU]/mL (ref 0.450–4.500)

## 2021-10-26 ENCOUNTER — Other Ambulatory Visit: Payer: Self-pay

## 2021-10-26 ENCOUNTER — Other Ambulatory Visit: Payer: Self-pay | Admitting: Nurse Practitioner

## 2021-10-26 ENCOUNTER — Ambulatory Visit: Payer: Medicare Other

## 2021-10-26 ENCOUNTER — Telehealth: Payer: Self-pay | Admitting: *Deleted

## 2021-10-26 MED ORDER — VITAMIN D3 25 MCG (1000 UT) PO CAPS: 1000.0000 [IU] | ORAL_CAPSULE | Freq: Every day | ORAL | 3 refills | Status: DC

## 2021-10-26 NOTE — Chronic Care Management (AMB) (Signed)
?  Chronic Care Management  ? ?Outreach Note ? ?10/26/2021 ?Name: Gwendolyn Woodward MRN: [id number] DOB: [birth year] ? ?Gwendolyn Woodward is a 56 y.o. year old female who is a primary care patient of Renee Rival, FNP. I reached out to Tereasa Coop by phone today in response to a referral sent by Ms. [place] primary care provider. ? ?An unsuccessful telephone outreach was attempted today. The patient was referred to the case management team for assistance with care management and care coordination.  ? ?Follow Up Plan: A HIPAA compliant phone message was left for the patient providing contact information and requesting a return call.  ?The care management team will reach out to the patient again over the next 7 days.  ?If patient returns call to provider office, please advise to call [place] * at [phone].* ? ?Laverda Sorenson  ?Care Guide, Embedded Care Coordination ?[place]  Care Management  ?Direct Dial: [phone] ? ? ?

## 2021-10-26 NOTE — Progress Notes (Signed)
Please review results with patient.    LDL level is elevated.  Patient should start taking atorvastatin 20 mg tablets daily.   Low vitamin D.  Patient should start taking vitamin D 1000 units daily.  Calcium is elevated, patient should avoid calcium supplement.   Alkaline phosphate is elevated, will monitor this, patient should avoid alcohol.  Thanks

## 2021-10-27 ENCOUNTER — Ambulatory Visit (INDEPENDENT_AMBULATORY_CARE_PROVIDER_SITE_OTHER): Payer: Medicare Other | Admitting: Licensed Clinical Social Worker

## 2021-10-27 ENCOUNTER — Other Ambulatory Visit: Payer: Self-pay

## 2021-10-27 ENCOUNTER — Ambulatory Visit (HOSPITAL_BASED_OUTPATIENT_CLINIC_OR_DEPARTMENT_OTHER): Payer: Medicare Other | Admitting: Internal Medicine

## 2021-10-27 DIAGNOSIS — G4739 Other sleep apnea: Secondary | ICD-10-CM

## 2021-10-27 DIAGNOSIS — F39 Unspecified mood [affective] disorder: Secondary | ICD-10-CM

## 2021-10-27 DIAGNOSIS — F339 Major depressive disorder, recurrent, unspecified: Secondary | ICD-10-CM

## 2021-10-27 NOTE — Chronic Care Management (AMB) (Signed)
?  Chronic Care Management  ? ?Note ? ?10/27/2021 ?Name: Gwendolyn Woodward MRN: [id number] DOB: 12/06/1965 ? ?Gwendolyn Woodward is a 56 y.o. year old female who is a primary care patient of Renee Rival, FNP. I reached out to Tereasa Coop by phone today in response to a referral sent by [name] PCP. ? ?Ms. Bohac was given information about Chronic Care Management services today including:  ?CCM service includes personalized support from designated clinical staff supervised by her physician, including individualized plan of care and coordination with other care providers ?24/7 contact phone numbers for assistance for urgent and routine care needs. ?Service will only be billed when office clinical staff spend 20 minutes or more in a month to coordinate care. ?Only one practitioner may furnish and bill the service in a calendar month. ?The patient may stop CCM services at any time (effective at the end of the month) by phone call to the office staff. ?The patient is responsible for co-pay (up to 20% after annual deductible is met) if co-pay is required by the individual health plan.  ? ?Patient agreed to services and verbal consent obtained.  ? ?Follow up plan: ?Telephone appointment with care management team member scheduled for:10/27/21 ? ?Laverda Sorenson  ?Care Guide, Embedded Care Coordination ?[address]  Care Management  ?Direct Dial: [phone] ? ?

## 2021-10-27 NOTE — Patient Instructions (Signed)
Visit Information  ? ?Thank you for taking time to visit with me today. Please don't hesitate to contact me if I can be of assistance to you before our next scheduled telephone appointment. ? ?Following are the goals we discussed today: Connecting with a Mental Health provider ? ?The Care Guide will contact you to reschedule the phone appointment   ?Please call the office if needed ? ?Gwendolyn Lanius, LCSW ?Licensed Clinical Social Worker [name]  ?CCM LCSW Coverage for [name] ? ?Please call the care guide team at [phone] if you need to cancel or reschedule your appointment.  ? ?If you are experiencing a Mental Health or [place] or need someone to talk to, please call the Suicide and Crisis Lifeline: 988 ?call the [place] National Suicide Prevention Lifeline: (914)279-4049 or TTY: (646) 733-1843 TTY 254-358-1977) to talk to a trained counselor ?call 1-800-273-TALK (toll free, 24 hour hotline) ?call 911  ? ?Following is a copy of your full care plan:  ?Care Plan : [place]  ?Updates made by Gwendolyn Cane, LCSW since 10/27/2021 12:00 AM  ?  ? ?Problem: Symptoms (Depression)   ?  ? ?Goal: Symptoms Monitored and Managed   ?Start Date: 10/27/2021  ?This Visit's Progress: On track  ?Priority: High  ?Note:   ?Current Barriers:  ?Disease Management support and education needs related to Depression: suicidal thoughts without plan and Mood Instability ?Lacks knowledge of how to connect  ? ?CSW Clinical Goal(s):  ?Patient  will demonstrate a reduction in symptoms related to :Mood Instability  ?verbalize understanding of plan for management of Depression  ?patient will work with therapist to address needs related to managing mood  through collaboration with Clinical Social Worker, provider, and care team.  ? ?Interventions: ?1:1 collaboration with primary care provider regarding development and update of comprehensive plan of care as evidenced by provider attestation and  co-signature ?Inter-disciplinary care team collaboration (see longitudinal plan of care) ?Evaluation of current treatment plan related to  self management and patient's adherence to plan as established by provider ? ?Mental Health:  (Status: New goal.) ?Evaluation of current treatment plan related to Mood Instability ?Depression screen reviewed  ?Solution-Focused Strategies employed:  ?Problem [place] strategies reviewed ?Provided psychoeducation for mental health needs  ?Participation in counseling encouraged  ?Suicidal Ideation/Homicidal Ideation assessed: denied ?Discussed referral for psychiatry: [place] ?Collaborated with [facility] in [place] ?Made referral to [facility] for medication management and [facility] in [place] for face to face counseling ? and  ?Social Determinants of Health in Patient with HTN and Depression:  (Status: New goal.) ?SDOH assessments completed: Social Connections and Physical Activity ?Evaluation of current treatment plan related to Mood Instability ?Behavioral Activation reviewed ?Problem Solving /Task Center strategies reviewed ?Reconnecting with the [facility]   ? ?Task & activities to accomplish goals: ?I have placed two referrals they will call you, if no one have called you in one week you can contact them   ?[place], [facility] for medication [address] , [place]   [place] ?[place] at [facility]   for counseling [address] [phone]  ?Please continue with your activities at the [facility] ?  ? ? ?Consent to CCM Services: ?[name] was given information about Chronic Care Management services including:  ?CCM service includes personalized support from designated clinical staff supervised by her physician, including individualized plan of  care and coordination with other care providers ?24/7 contact phone  numbers for assistance for urgent and routine care needs. ?Service will only be billed when office clinical staff spend 20 minutes or more in a month to coordinate care. ?Only one practitioner may furnish and bill the service in a calendar month. ?The patient may stop CCM services at any time (effective at the end of the month) by phone call to the office staff. ?The patient will be responsible for cost sharing (co-pay) of up to 20% of the service fee (after annual deductible is met). ? ?Patient agreed to services and verbal consent obtained.  ? ?The patient verbalized understanding of instructions, educational materials, and care plan provided today and agreed to receive a mailed copy of patient instructions, educational materials, and care plan.  ? ? ? ?  ?

## 2021-10-27 NOTE — Chronic Care Management (AMB) (Signed)
?Chronic Care Management  ? Clinical Social Work Note ? ?10/27/2021 ?Name: MARANATHA GROSSI MRN: [id number] DOB: [birth year] ? ?Gwendolyn Woodward is a 56 y.o. year old female who is a primary care patient of Renee Rival, FNP. The CCM team was consulted to assist the patient with chronic disease management and/or care coordination needs related to: Mental Health Counseling and Resources.  ? ?Engaged with patient by telephone for initial visit in response to provider referral for social work chronic care management and care coordination services.  ? ?Consent to Services:  ?The patient was given the following information about Chronic Care Management services today, agreed to services, and gave verbal consent: 1. CCM service includes personalized support from designated clinical staff supervised by the primary care provider, including individualized plan of care and coordination with other care providers 2. 24/7 contact phone numbers for assistance for urgent and routine care needs. 3. Service will only be billed when office clinical staff spend 20 minutes or more in a month to coordinate care. 4. Only one practitioner may furnish and bill the service in a calendar month. 5.The patient may stop CCM services at any time (effective at the end of the month) by phone call to the office staff. 6. The patient will be responsible for cost sharing (co-pay) of up to 20% of the service fee (after annual deductible is met). Patient agreed to services and consent obtained. ? ?Patient agreed to services and consent obtained.  ? ?Summary: Assessed patient's previous and current treatment, coping skills, support system and barriers to care. She is currently experiencing symptoms of  depression which seems to be exacerbated by death of her pet, and isolation of living alone..  See Care Plan below for interventions and patient self-care actives. ? ?Recommendation: Patient may benefit from, and is in agreement for LCSW to make  referral to [facility] for medication management, [facility] for counseling and she will start going back to the [facility].  ? ?Follow up Plan: Patient would like continued follow-up from CCM LCSW.    Will call office if needed prior to next encounter. ?Will route chart to Care Guide to reschedule phone appointment   ?  ? ?Assessment: Review of patient past medical history, allergies, medications, and health status, including review of relevant consultants reports was performed today as part of a comprehensive evaluation and provision of chronic care management and care coordination services.    ? ?SDOH (Social Determinants of Health) assessments and interventions performed:  ?SDOH Interventions   ? ?Flowsheet Row Most Recent Value  ?SDOH Interventions   ?Food Insecurity Interventions --  [has foodstamps]  ?Social Connections Interventions [facility] [employer]  [will look to reconnect]  ? ?  ?  ? ?Advanced Directives Status: Not addressed in this encounter. ? ?CCM Care Plan ?Conditions to be addressed/monitored: Depression; Mental Health Concerns  ? ?Care Plan : LCSW Plan of Care  ?Updates made by [name], LCSW since 10/27/2021 12:00 AM  ?  ? ?Problem: Symptoms (Depression)   ?  ? ?Goal: Symptoms Monitored and Managed   ?Start Date: 10/27/2021  ?This Visit's Progress: On track  ?Priority: High  ?Note:   ?Current Barriers:  ?Disease Management support and education needs related to Depression: suicidal thoughts without plan and Mood Instability ?Lacks knowledge of how to connect  ? ?CSW Clinical Goal(s):  ?Patient  will demonstrate a reduction in symptoms related to :Mood Instability  ?verbalize understanding of plan for management of Depression  ?patient  will work with therapist to address needs related to managing mood  through collaboration with Clinical Social Worker, provider, and care team.  ? ?Interventions: ?1:1 collaboration with primary care provider regarding development and update of comprehensive  plan of care as evidenced by provider attestation and co-signature ?Inter-disciplinary care team collaboration (see longitudinal plan of care) ?Evaluation of current treatment plan related to  self management and patient's adherence to plan as established by provider ? ?Mental Health:  (Status: New goal.) ?Evaluation of current treatment plan related to Mood Instability ?Depression screen reviewed  ?Solution-Focused Strategies employed:  ?Problem [place] strategies reviewed ?Provided psychoeducation for mental health needs  ?Participation in counseling encouraged  ?Suicidal Ideation/Homicidal Ideation assessed: denied ?Discussed referral for psychiatry: [place] ?Collaborated with [facility] in [place] ?Made referral to [facility] for medication management and [facility] in [place] for face to face counseling ? and  ?Social Determinants of Health in Patient with HTN and Depression:  (Status: New goal.) ?SDOH assessments completed: Social Connections and Physical Activity ?Evaluation of current treatment plan related to Mood Instability ?Behavioral Activation reviewed ?Problem Solving /Task Center strategies reviewed ?[facility] with [facility] [employer]   ? ?Task & activities to accomplish goals: ?I have placed two referrals they will call you, if no one have called you in one week you can contact them   ?[place], [facility] for medication [address] , [place]   [place] ?[place] at [facility]   for counseling [phone]  ?Please continue with your activities at the [facility] ?  ? Casimer Lanius, LCSW ?Licensed Clinical Social Worker [name]  ?CCM LCSW Coverage for [facility] ?[phone]  ? ? ?

## 2021-10-29 ENCOUNTER — Ambulatory Visit
Admission: RE | Admit: 2021-10-29 | Discharge: 2021-10-29 | Disposition: A | Discharge status: Home / Self Care | Payer: Medicare Other | Source: Ambulatory Visit | Attending: Nurse Practitioner | Admitting: Nurse Practitioner

## 2021-10-29 ENCOUNTER — Other Ambulatory Visit: Payer: Self-pay | Admitting: Nurse Practitioner

## 2021-10-29 DIAGNOSIS — R928 Other abnormal and inconclusive findings on diagnostic imaging of breast: Secondary | ICD-10-CM

## 2021-10-29 DIAGNOSIS — R921 Mammographic calcification found on diagnostic imaging of breast: Secondary | ICD-10-CM

## 2021-10-31 DIAGNOSIS — G4733 Obstructive sleep apnea (adult) (pediatric): Secondary | ICD-10-CM

## 2021-11-02 ENCOUNTER — Ambulatory Visit (INDEPENDENT_AMBULATORY_CARE_PROVIDER_SITE_OTHER): Payer: Medicare Other

## 2021-11-02 ENCOUNTER — Ambulatory Visit: Admission: RE | Admit: 2021-11-02 | Payer: Medicare Other | Source: Ambulatory Visit

## 2021-11-02 ENCOUNTER — Other Ambulatory Visit: Payer: Self-pay

## 2021-11-02 ENCOUNTER — Other Ambulatory Visit: Payer: Self-pay | Admitting: Nurse Practitioner

## 2021-11-02 DIAGNOSIS — Z0000 Encounter for general adult medical examination without abnormal findings: Secondary | ICD-10-CM | POA: Diagnosis not present

## 2021-11-02 MED ORDER — ACCU-CHEK SOFTCLIX LANCETS MISC
1 refills | Status: DC
Start: 2021-11-02 — End: 2021-11-03

## 2021-11-02 NOTE — Patient Instructions (Signed)
?  Gwendolyn Woodward , ?Thank you for taking time to come for your Medicare Wellness Visit. I appreciate your ongoing commitment to your health goals. Please review the following plan we discussed and let me know if I can assist you in the future.  ? ?These are the goals we discussed: ? Goals   ? ?  Patient Stated   ?  Pt states that she plans on getting her eye exam soon, no other goals at this time ?  ? ?  ?  ?This is a list of the screening recommended for you and due dates:  ?Health Maintenance  ?Topic Date Due  ? COVID-19 Vaccine (1) Never done  ? HIV Screening  Never done  ? Hepatitis C Screening: USPSTF Recommendation to screen - Ages 30-79 yo.  Never done  ? Zoster (Shingles) Vaccine (1 of 2) Never done  ? Complete foot exam   02/22/2017  ? Eye exam for diabetics  10/27/2017  ? Colon Cancer Screening  01/26/2022  ? Hemoglobin A1C  04/25/2022  ? Pap Smear  05/03/2022  ? Tetanus Vaccine  09/23/2022  ? Mammogram  09/30/2023  ? HPV Vaccine  Aged Out  ? Flu Shot  Discontinued  ?  ?

## 2021-11-02 NOTE — Progress Notes (Cosign Needed)
Subjective:   Gwendolyn Woodward is a 56 y.o. female who presents for Medicare Annual (Subsequent) preventive examination. I connected with  Tereasa Coop on 11/02/21 by a audio enabled telemedicine application and verified that I am speaking with the correct person using two identifiers.  Patient Location: Home  Provider Location: Home Office  I discussed the limitations of evaluation and management by telemedicine. The patient expressed understanding and agreed to proceed.  Review of Systems           Objective:    There were no vitals filed for this visit. There is no height or weight on file to calculate BMI.  Advanced Directives 04/19/2020 08/29/2014 01/27/2012 10/08/2011  Does Patient Have a Medical Advance Directive? No Yes Patient does not have advance directive;Patient would not like information Patient does not have advance directive  Type of Advance Directive - [place] in Chart? - No - copy requested - -  Would patient like information on creating a medical advance directive? Yes (Inpatient - patient requests chaplain consult to create a medical advance directive) - - -  Pre-existing out of facility DNR order (yellow form or pink MOST form) - - No -    Current Medications (verified) Outpatient Encounter Medications as of 11/02/2021  Medication Sig   ACCU-CHEK GUIDE test strip USE 1 STRIP TO CHECK GLUCOSE AS DIRECTED DAILY   Accu-Chek Softclix Lancets lancets SMARTSIG:Topical   acetaminophen (TYLENOL) 325 MG tablet Take 2 tablets (650 mg total) by mouth every 6 (six) hours as needed for mild pain or moderate pain.   amLODipine (NORVASC) 10 MG tablet Take 1 tablet (10 mg total) by mouth daily.   atorvastatin (LIPITOR) 10 MG tablet Take 10 mg by mouth at bedtime.   Cholecalciferol (VITAMIN D3) 25 MCG (1000 UT) CAPS Take 1 capsule (1,000 Units total) by mouth daily.   gabapentin (NEURONTIN) 600 MG tablet Take 600  mg by mouth 3 (three) times daily.   hydrALAZINE (APRESOLINE) 25 MG tablet Take 1 tablet (25 mg total) by mouth 3 (three) times daily.   lisinopril-hydrochlorothiazide (ZESTORETIC) 20-25 MG tablet Take 1 tablet by mouth daily.   Melatonin 5 MG CHEW Chew by mouth.   meloxicam (MOBIC) 7.5 MG tablet Take 7.5 mg by mouth daily.   metFORMIN (GLUCOPHAGE-XR) 500 MG 24 hr tablet SMARTSIG:1 Tablet(s) By Mouth Every Evening   oxybutynin (DITROPAN XL) 10 MG 24 hr tablet Take 1 tablet (10 mg total) by mouth daily.   pramipexole (MIRAPEX) 0.5 MG tablet SMARTSIG:1 Tablet(s) By Mouth Every Evening   rizatriptan (MAXALT) 10 MG tablet SMARTSIG:1 Tablet(s) By Mouth 1-2 Times Daily   zolpidem (AMBIEN) 5 MG tablet Take 5 mg by mouth at bedtime as needed.   No facility-administered encounter medications on file as of 11/02/2021.    Allergies (verified) Aspirin and Tramadol   History: Past Medical History:  Diagnosis Date   Arthritis    back pain and knee pain, no meds   Asthma    Chest pain    + dyspnea   CVA (cerebral infarction) 1984 , 1988   x2, left sided weakness, history of dizziness   Depression with anxiety    History- no meds   Dysphagia    Fasting hyperglycemia    Gastroesophageal reflux disease    no meds   Headache(784.0)    OTC med PRN   History of anemia    S/p transfusions in  1999 at [facility] after vaginal bleeding; normal CBC and 09/2011   Hyperlipidemia    lipid profile in 11/2011: 203, 106, 58, 124   Hypertension    Lab 09/2011: Normal CMet except glucose of 109-169 and albumin-3.4; normal BP 09/2011-12/2011   Sleep apnea    Does not use CPAP - no longer has CPAP machine   Stroke [facility])    Past Surgical History:  Procedure Laterality Date   CESAREAN SECTION  1984, 1987   x 2   COLONOSCOPY  01/27/2012   Procedure: COLONOSCOPY;  Surgeon: Danie Binder, MD;  [facility];  Service: Endoscopy;  Laterality: N/A;  8:30   ENDOMETRIAL ABLATION  Feb 2013    ESOPHAGOGASTRODUODENOSCOPY  5/11   [facility]-Dr Iva Lento GERD distal esophagus, NEGATIVE bx for [place]   EXTERNAL FIXATION LEG Right 04/19/2020   Procedure: EXTERNAL FIXATION RIGHT ANKLE;  Surgeon: Mcarthur Rossetti, MD;  Location: [place];  Service: Orthopedics;  Laterality: Right;   IUD REMOVAL  10/20/2011   Procedure: INTRAUTERINE DEVICE (IUD) REMOVAL;  Surgeon: Allena Katz, MD;  Location: [place] [facility];  Service: Gynecology;  Laterality: N/A;   OPEN REDUCTION INTERNAL FIXATION (ORIF) TIBIA/FIBULA FRACTURE Right 04/23/2020   Procedure: OPEN REDUCTION INTERNAL FIXATION (ORIF) TIBIA/FIBULA FRACTURE;  Surgeon: Shona Needles, MD;  Location: [place];  Service: Orthopedics;  Laterality: Right;   TUBAL LIGATION     WISDOM TOOTH EXTRACTION     Family History  Problem Relation Age of Onset   Cancer Father        Lung/Throat   Heart disease Father    Hypertension Mother    Asthma Mother    Diabetes Maternal Uncle    Anesthesia problems Neg Hx    Social History   Socioeconomic History   Marital status: Divorced    Spouse name: Not on file   Number of children: 2   Years of education: Not on file   Highest education level: Not on file  Occupational History   Occupation: sock boarder    Comment: just got new job in [place]   Smoking status: Never   Smokeless tobacco: Never  Vaping Use   Vaping Use: Never used  Substance and Sexual Activity   Alcohol use: Yes    Comment: Socially, 2 times per yr   Drug use: No   Sexual activity: Not Currently    Birth control/protection: Surgical, Post-menopausal, Abstinence    Comment: tubal  Other Topics Concern   Not on file  Social History Narrative   Lives alone-2 grown children   Social Determinants of Health   Financial Resource Strain: Medium Risk   Difficulty of Paying Living Expenses: Somewhat hard  Food Insecurity: No Food Insecurity   Worried About Charity fundraiser in the Last Year: Never  true   [place] in the Last Year: Never true  Transportation Needs: No Transportation Needs   Lack of Transportation (Medical): No   Lack of Transportation (Non-Medical): No  Physical Activity: Not on file  Stress: Stress Concern Present   Feeling of Stress : Rather much  Social Connections: Moderately Isolated   Frequency of Communication with Friends and Family: Once a week   Frequency of Social Gatherings with Friends and Family: Once a week   Attends Religious Services: More than 4 times per year   Active Member of [employer] or Organizations: Yes   Attends Archivist Meetings: 1 to 4 times  per year   Marital Status: Divorced    Tobacco Counseling Counseling given: Not Answered   Clinical Intake:                 Diabetic?yes         Activities of Daily Living No flowsheet data found.  Patient Care Team: Renee Rival, FNP as PCP - General (Nurse Practitioner) Danie Binder, MD (Inactive) (Gastroenterology) Rothbart, Cristopher Estimable, MD (Inactive) (Cardiology) Shea Evans Norva Riffle, LCSW as [place] Management (Licensed Clinical Social Worker)  Indicate any recent [place] you may have received from other than [facility] providers in the past year (date may be approximate).     Assessment:   This is a routine wellness examination for Gwendolyn Woodward.  Hearing/Vision screen No results found.  Dietary issues and exercise activities discussed:     Goals Addressed   None    Depression Screen PHQ 2/9 Scores 10/23/2021 10/23/2021 09/24/2021 03/16/2017 12/06/2016 02/23/2016  PHQ - 2 Score '6 6 1 6 2 6  '$ PHQ- 9 Score 16 22 - '24 22 21    '$ Fall Risk Fall Risk  10/23/2021 09/24/2021 03/16/2017 12/06/2016 02/23/2016  Falls in the past year? 0 1 Yes Yes No  Number falls in past yr: 0 1 2 or more 2 or more -  Injury with Fall? 0 0 Yes No -  Risk for fall due to : No Fall Risks No Fall Risks;History of fall(s) - - -  Follow up Falls evaluation  completed Falls evaluation completed - - -    FALL RISK PREVENTION PERTAINING TO THE HOME:  Any stairs in or around the home? Yes  If so, are there any without handrails? No  Home free of loose throw rugs in walkways, pet beds, electrical cords, etc? Yes  Adequate lighting in your home to reduce risk of falls? Yes   ASSISTIVE DEVICES UTILIZED TO PREVENT FALLS:  Life alert? Yes  Use of a cane, walker or w/c? Yes  Grab bars in the bathroom? Yes  Shower chair or bench in shower? No  Elevated toilet seat or a handicapped toilet? No   TIMED UP AND GO:  Was the test performed? No .  Length of time to ambulate 10 feet:    Cognitive Function:        Immunizations Immunization History  Administered Date(s) Administered   Tdap 09/23/2012    TDAP status: Up to date  Flu Vaccine status: Declined, Education has been provided regarding the importance of this vaccine but patient still declined. Advised may receive this vaccine at local pharmacy or Health Dept. Aware to provide a copy of the vaccination record if obtained from local pharmacy or Health Dept. Verbalized acceptance and understanding.  Pneumococcal vaccine status: Declined,  Education has been provided regarding the importance of this vaccine but patient still declined. Advised may receive this vaccine at local pharmacy or Health Dept. Aware to provide a copy of the vaccination record if obtained from local pharmacy or Health Dept. Verbalized acceptance and understanding.   Covid-19 vaccine status: Declined, Education has been provided regarding the importance of this vaccine but patient still declined. Advised may receive this vaccine at local pharmacy or Health Dept.or vaccine clinic. Aware to provide a copy of the vaccination record if obtained from local pharmacy or Health Dept. Verbalized acceptance and understanding.  Qualifies for Shingles Vaccine? Yes   Zostavax completed No   Shingrix Completed?: No.    Education  has been provided  regarding the importance of this vaccine. Patient has been advised to call insurance company to determine out of pocket expense if they have not yet received this vaccine. Advised may also receive vaccine at local pharmacy or Health Dept. Verbalized acceptance and understanding.  Screening Tests Health Maintenance  Topic Date Due   COVID-19 Vaccine (1) Never done   HIV Screening  Never done   Hepatitis C Screening  Never done   Zoster Vaccines- Shingrix (1 of 2) Never done   FOOT EXAM  02/22/2017   OPHTHALMOLOGY EXAM  10/27/2017   COLONOSCOPY (Pts 45-82yr Insurance coverage will need to be confirmed)  01/26/2022   HEMOGLOBIN A1C  04/25/2022   PAP SMEAR-Modifier  05/03/2022   TETANUS/TDAP  09/23/2022   MAMMOGRAM  09/30/2023   HPV VACCINES  Aged Out   INFLUENZA VACCINE  Discontinued    Health Maintenance  Health Maintenance Due  Topic Date Due   COVID-19 Vaccine (1) Never done   HIV Screening  Never done   Hepatitis C Screening  Never done   Zoster Vaccines- Shingrix (1 of 2) Never done   FOOT EXAM  02/22/2017   OPHTHALMOLOGY EXAM  10/27/2017    Colorectal cancer screening: Type of screening: Colonoscopy. Completed 2013. Repeat every 10 years  Mammogram status: Completed 2023. Repeat every year  Lung Cancer Screening: (Low Dose CT Chest recommended if Age 58-80years, 30 pack-year currently smoking OR have quit w/in 15years.) does not qualify.   Lung Cancer Screening Referral:   Additional Screening:  Hepatitis C Screening: does qualify;  Vision Screening: Recommended annual ophthalmology exams for early detection of glaucoma and other disorders of the eye. Is the patient up to date with their annual eye exam?  No  Who is the provider or what is the name of the office in which the patient attends annual eye exams? [facility] pt is not established with a provider, would they like to be referred to a provider to establish care? No .   Dental  Screening: Recommended annual dental exams for proper oral hygiene  Community Resource Referral / Chronic Care Management: CRR required this visit?  No   CCM required this visit?  No  Nutrition Risk Assessment:  Has the patient had any N/V/D within the last 2 months?  Yes  Does the patient have any non-healing wounds?  No  Has the patient had any unintentional weight loss or weight gain?  Yes   Diabetes:  Is the patient diabetic?  Yes  If diabetic, was a CBG obtained today?  No  Did the patient bring in their glucometer from home?  No  How often do you monitor your CBG's? Every 2 weeks   Financial Strains and Diabetes Management:  Are you having any financial strains with the device, your supplies or your medication? No .  Does the patient want to be seen by Chronic Care Management for management of their diabetes?  NO Would the patient like to be referred to a Nutritionist or for Diabetic Management?  No   Diabetic Exams:  Diabetic Eye Exam: Overdue for diabetic eye exam. Pt has been advised about the importance in completing this exam. Patient advised to call and schedule an eye exam. Diabetic Foot Exam: Overdue, Pt has been advised about the importance in completing this exam. Pt is scheduled for diabetic foot exam on  .     Plan:     I have personally reviewed and noted the following in the patients  chart:   Medical and social history Use of alcohol, tobacco or illicit drugs  Current medications and supplements including opioid prescriptions.  Functional ability and status Nutritional status Physical activity Advanced directives List of other physicians Hospitalizations, surgeries, and ER visits in previous 12 months Vitals Screenings to include cognitive, depression, and falls Referrals and appointments  In addition, I have reviewed and discussed with patient certain preventive protocols, quality metrics, and best practice recommendations. A written personalized  care plan for preventive services as well as general preventive health recommendations were provided to patient.     Gwendolyn Woodward, [place]   11/02/2021   Nurse Notes:

## 2021-11-03 ENCOUNTER — Other Ambulatory Visit: Payer: Self-pay

## 2021-11-03 MED ORDER — ACCU-CHEK SOFTCLIX LANCETS MISC: 1 refills | Status: DC

## 2021-11-04 ENCOUNTER — Encounter: Payer: Self-pay | Admitting: Adult Health

## 2021-11-04 ENCOUNTER — Other Ambulatory Visit: Payer: Self-pay

## 2021-11-04 ENCOUNTER — Ambulatory Visit (INDEPENDENT_AMBULATORY_CARE_PROVIDER_SITE_OTHER): Payer: Medicare Other | Admitting: Adult Health

## 2021-11-04 VITALS — BP 176/92 | HR 80 | Ht 60.0 in | Wt 260.5 lb

## 2021-11-04 DIAGNOSIS — I10 Essential (primary) hypertension: Secondary | ICD-10-CM | POA: Diagnosis not present

## 2021-11-04 MED ORDER — AMLODIPINE BESYLATE 10 MG PO TABS
10.0000 mg | ORAL_TABLET | Freq: Every day | ORAL | 3 refills | Status: DC
Start: 2021-11-04 — End: 2022-03-09

## 2021-11-04 NOTE — Progress Notes (Signed)
?  Subjective:  ?  ? Patient ID: Gwendolyn Woodward, female   DOB: [birth year], 56 y.o.   MRN: [id number] ? ?HPI ?Gwendolyn Woodward is a 56 year old black female, divorced, PM in for BP check. It is still high ?Has seen PCP recently. ?And is having breast biopsy soon and a sleep study. ?PCP is F. Paseda NP ? ?Lab Results  ?Component Value Date  ? DIAGPAP  05/04/2019  ?  NEGATIVE FOR INTRAEPITHELIAL LESIONS OR MALIGNANCY.  ? DIAGPAP TRICHOMONAS VAGINALIS PRESENT. 05/04/2019  ? HPV NOT Detected 05/04/2019  ?  ?Review of Systems ?BP is still high she says  ?Having biopsy on right breast soon ?Having sleep study ?Reviewed past medical,surgical, social and family history. Reviewed medications and allergies.  ?   ?Objective:  ? Physical Exam ?BP (!) 176/92 (BP Location: Left Arm, Patient Position: Sitting, Cuff Size: Normal)   Pulse 80   Ht 5' (1.524 m)   Wt 260 lb 8 oz (118.2 kg)   LMP 10/20/2011   BMI 50.88 kg/m?   ?  Skin warm and dry. Lungs: clear to ausculation bilaterally. Cardiovascular: regular rate and rhythm.  ?Fall risk is low ? [facility] - 11/04/21 1135   ? ?  ? Pregnancy Intention Screening  ? Does the patient want to become pregnant in the next year? No   ? Does the patient's partner want to become pregnant in the next year? No   ? Would the patient like to discuss contraceptive options today? No   ?  ? Contraception Wrap Up  ? Current Method Female Sterilization   ? End Method Female Sterilization   ? Contraception Counseling Provided No   ? ?  ?  ? ?  ?  ?Assessment:  ?   ?1. Hypertension, unspecified type ?She is taking norvasc 10 mg, 1 daily will refill  ?And is on zestoretic 20-25 mg 1 daily and has has had 25 mg apresoline bid added recently by PCP ?  Follow up with PCP regarding BP and meds, now that she is seeing her regularly ? ?Plan:  ?   ?Return in 6 months for pap ?   ?

## 2021-11-04 NOTE — Telephone Encounter (Signed)
Already taken care of

## 2021-11-05 ENCOUNTER — Telehealth: Payer: Self-pay | Admitting: *Deleted

## 2021-11-05 NOTE — Telephone Encounter (Signed)
[facility] needs referral resent to them they received face sheet but no office note or insurance info  ?

## 2021-11-10 ENCOUNTER — Telehealth: Payer: Self-pay | Admitting: *Deleted

## 2021-11-10 NOTE — Chronic Care Management (AMB) (Signed)
?  Care Management  ? ?Note ? ?11/10/2021 ?Name: Gwendolyn Woodward MRN: [id number] DOB: [birth year] ? ?Gwendolyn Woodward is a 56 y.o. year old female who is a primary care patient of Renee Rival, FNP and is actively engaged with the care management team. I reached out to Tereasa Coop by phone today to assist with scheduling a follow up visit with the Licensed Clinical Social Worker ? ?Follow up plan: ?Telephone appointment with care management team member scheduled for:12/01/21 ? ?Laverda Sorenson  ?Care Guide, Embedded Care Coordination ?[facility]  Care Management  ?Direct Dial: [phone] ? ?

## 2021-11-10 NOTE — Chronic Care Management (AMB) (Signed)
?  Care Management  ? ?Note ? ?11/10/2021 ?Name: Gwendolyn Woodward MRN: [id number] DOB: 01-28-1966 ? ?Gwendolyn Woodward is a 56 y.o. year old female who is a primary care patient of Renee Rival, FNP and is actively engaged with the care management team. I reached out to Tereasa Coop by phone today to assist with scheduling a follow up visit with the Licensed Clinical Social Worker ? ?Follow up plan: ?Unsuccessful telephone outreach attempt made. A HIPAA compliant phone message was left for the patient providing contact information and requesting a return call.  ?The care management team will reach out to the patient again over the next 7 days.  ?If patient returns call to provider office, please advise to call [place] at [phone]. ? ?Laverda Sorenson  ?Care Guide, Embedded Care Coordination ?[place]  Care Management  ?Direct Dial: [phone] ? ?

## 2021-11-12 ENCOUNTER — Other Ambulatory Visit: Payer: Self-pay

## 2021-11-12 ENCOUNTER — Ambulatory Visit
Admission: RE | Admit: 2021-11-12 | Discharge: 2021-11-12 | Disposition: A | Discharge status: Home / Self Care | Payer: Medicare Other | Source: Ambulatory Visit | Attending: Nurse Practitioner | Admitting: Nurse Practitioner

## 2021-11-12 DIAGNOSIS — R921 Mammographic calcification found on diagnostic imaging of breast: Secondary | ICD-10-CM

## 2021-11-16 ENCOUNTER — Other Ambulatory Visit: Payer: Self-pay | Admitting: Nurse Practitioner

## 2021-11-16 DIAGNOSIS — N6489 Other specified disorders of breast: Secondary | ICD-10-CM

## 2021-11-20 DIAGNOSIS — F339 Major depressive disorder, recurrent, unspecified: Secondary | ICD-10-CM

## 2021-11-24 ENCOUNTER — Ambulatory Visit: Payer: Medicare Other | Admitting: Nurse Practitioner

## 2021-11-25 ENCOUNTER — Ambulatory Visit (HOSPITAL_BASED_OUTPATIENT_CLINIC_OR_DEPARTMENT_OTHER): Payer: Medicare Other | Attending: Nurse Practitioner | Admitting: Internal Medicine

## 2021-11-25 VITALS — Ht 60.0 in | Wt 260.0 lb

## 2021-11-25 DIAGNOSIS — G4733 Obstructive sleep apnea (adult) (pediatric): Secondary | ICD-10-CM | POA: Insufficient documentation

## 2021-11-25 DIAGNOSIS — G4739 Other sleep apnea: Secondary | ICD-10-CM

## 2021-11-30 ENCOUNTER — Other Ambulatory Visit: Payer: Medicare Other

## 2021-12-01 ENCOUNTER — Ambulatory Visit (INDEPENDENT_AMBULATORY_CARE_PROVIDER_SITE_OTHER): Payer: Medicare Other | Admitting: Licensed Clinical Social Worker

## 2021-12-01 DIAGNOSIS — I10 Essential (primary) hypertension: Secondary | ICD-10-CM

## 2021-12-01 DIAGNOSIS — F339 Major depressive disorder, recurrent, unspecified: Secondary | ICD-10-CM

## 2021-12-01 DIAGNOSIS — F39 Unspecified mood [affective] disorder: Secondary | ICD-10-CM
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
## 2021-12-01 NOTE — Patient Instructions (Addendum)
Visit Information ? ?Patient Goals: Depressive symptoms identified; Manage depression issues ? ?Time Frame: Short Term Goal ?Priority:  High ?Progress:  On Track ? ?Start Date:  10/27/21 ?End Date:  01/27/22 ? ?Follow Up Date:  01/15/22 at 10:00 AM ? ?Depressive Symptoms Identified. Manage depression issues ? ?Patient Coping Skills: ?Goes to [facility] to exercise ?Attends medical appointments ? ?Patient Deficits ?Depression ?Weight challenges ? ?Patient Goals: ?Attend medical appointments in next 30 days ?Take medications as prescribed ?Try to have one or two social interactions weekly to build friendships with others ? ?Follow up Plan:  LCSW to call client on 01/15/22 at 10:00 AM ? ?Norva Riffle.Deangela Randleman MSW, LCSW ?Licensed Clinical Social Worker ?[place] [facility] ?[phone] ?

## 2021-12-02 ENCOUNTER — Encounter: Payer: Self-pay | Admitting: Family Medicine

## 2021-12-02 ENCOUNTER — Other Ambulatory Visit: Payer: Self-pay

## 2021-12-02 ENCOUNTER — Ambulatory Visit (INDEPENDENT_AMBULATORY_CARE_PROVIDER_SITE_OTHER): Payer: Medicare Other | Admitting: Family Medicine

## 2021-12-02 VITALS — BP 138/88 | HR 87 | Ht 60.0 in | Wt 259.2 lb

## 2021-12-02 DIAGNOSIS — Z8673 Personal history of transient ischemic attack (TIA), and cerebral infarction without residual deficits: Secondary | ICD-10-CM | POA: Diagnosis not present

## 2021-12-02 DIAGNOSIS — I10 Essential (primary) hypertension: Secondary | ICD-10-CM | POA: Diagnosis not present

## 2021-12-02 DIAGNOSIS — E119 Type 2 diabetes mellitus without complications: Secondary | ICD-10-CM | POA: Diagnosis not present

## 2021-12-02 NOTE — Progress Notes (Signed)
? ?Established Patient Office Visit ? ?Subjective:  ?Patient ID: Gwendolyn Woodward, female    DOB: 15-Jul-1966  Age: 56 y.o. MRN: [id number] ? ?CC:  ?Chief Complaint  ?Patient presents with  ? Hypertension  ?  Pt states shoulder surgery was held off due to elevated bp. Would also like to set up upcoming colonoscopy.   ? ? ?HPI ?Gwendolyn Woodward is a 56 y.o. Female with a past medical history of HTN, migraine headaches, CAP, and sleep apnea, presents for f/u of chronic medical conditions. ? ?Hypertension: The patient reports not remembering to take her blood pressure medications as prescribed and doesn't know which pill she takes in the morning and at night. She states her right shoulder surgery on 11/24/2021 was canceled because of elevated BP. She reports pain at the office and does not remember if she took the BP medication that day. The patient admits to not taking BP medication today and denies headaches, dizziness, and blurred vision. ? ?Past Medical History:  ?Diagnosis Date  ? Arthritis   ? back pain and knee pain, no meds  ? Asthma   ? Chest pain   ? + dyspnea  ? CVA (cerebral infarction) 1984 , 1988  ? x2, left sided weakness, history of dizziness  ? Depression with anxiety   ? History- no meds  ? Dysphagia   ? Fasting hyperglycemia   ? Gastroesophageal reflux disease   ? no meds  ? Headache(784.0)   ? OTC med PRN  ? History of anemia   ? S/p transfusions in 1999 at [facility] after vaginal bleeding; normal CBC and 09/2011  ? Hyperlipidemia   ? lipid profile in 11/2011: 203, 106, 58, 124  ? Hypertension   ? Lab 09/2011: Normal CMet except glucose of 109-169 and albumin-3.4; normal BP 09/2011-12/2011  ? Sleep apnea   ? Does not use CPAP - no longer has CPAP machine  ? Stroke [facility])   ? ? ?Past Surgical History:  ?Procedure Laterality Date  ? [place]  ? x 2  ? COLONOSCOPY  01/27/2012  ? Procedure: COLONOSCOPY;  Surgeon: Danie Binder, MD;  [facility];  Service: Endoscopy;  Laterality: N/A;   8:30  ? ENDOMETRIAL ABLATION  Feb 2013  ? ESOPHAGOGASTRODUODENOSCOPY  5/11  ? [facility]-Dr Iva Lento GERD distal esophagus, NEGATIVE bx for [place]  ? EXTERNAL FIXATION LEG Right 04/19/2020  ? Procedure: EXTERNAL FIXATION RIGHT ANKLE;  Surgeon: Mcarthur Rossetti, MD;  Location: [place];  Service: Orthopedics;  Laterality: Right;  ? IUD REMOVAL  10/20/2011  ? Procedure: INTRAUTERINE DEVICE (IUD) REMOVAL;  Surgeon: Allena Katz, MD;  Location: [facility];  Service: Gynecology;  Laterality: N/A;  ? OPEN REDUCTION INTERNAL FIXATION (ORIF) TIBIA/FIBULA FRACTURE Right 04/23/2020  ? Procedure: OPEN REDUCTION INTERNAL FIXATION (ORIF) TIBIA/FIBULA FRACTURE;  Surgeon: Shona Needles, MD;  Location: [facility];  Service: Orthopedics;  Laterality: Right;  ? TUBAL LIGATION    ? WISDOM TOOTH EXTRACTION    ? ? ?Family History  ?Problem Relation Age of Onset  ? Cancer Father   ?     Lung/Throat  ? Heart disease Father   ? Hypertension Mother   ? Asthma Mother   ? Diabetes Maternal Uncle   ? Anesthesia problems Neg Hx   ? ? ?Social History  ? ?Socioeconomic History  ? Marital status: Divorced  ?  Spouse name: Not on file  ? Number of children: 2  ? Years  of education: Not on file  ? Highest education level: Not on file  ?Occupational History  ? Occupation: sock boarder  ?  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 Smoking status: Never  ? Smokeless tobacco: Never  ?Vaping Use  ? Vaping Use: Never used  ?Substance and Sexual Activity  ? Alcohol use: Yes  ?  Comment: Socially, 2 times per yr  ? Drug use: No  ? Sexual activity: Yes  ?  Birth control/protection: Surgical  ?  Comment: tubal & ablation  ?Other Topics Concern  ? Not on file  ?Social History Narrative  ? Lives alone-2 grown children  ? ?Social Determinants of Health  ? ?Financial Resource Strain: Low Risk   ? Difficulty of Paying Living Expenses: Not hard at all  ?Food Insecurity: No Food Insecurity  ? Worried About Charity fundraiser in the  Last Year: Never true  ? Ran Out of Food in the Last Year: Never true  ?Transportation Needs: No Transportation Needs  ? Lack of Transportation (Medical): No  ? Lack of Transportation (Non-Medical): No  ?Physical Activity: Inactive  ? Days of Exercise per Week: 0 days  ? Minutes of Exercise per Session: 0 min  ?Stress: Stress Concern Present  ? Feeling of Stress : Rather much  ?Social Connections: Socially Isolated  ? Frequency of Communication with Friends and Family: Never  ? Frequency of Social Gatherings with Friends and Family: Never  ? Attends Religious Services: More than 4 times per year  ? Active Member of Clubs or Organizations: No  ? Attends Archivist Meetings: Never  ? Marital Status: Divorced  ?Intimate Partner Violence: Not on file  ? ? ?Outpatient Medications Prior to Visit  ?Medication Sig Dispense Refill  ? ACCU-CHEK GUIDE test strip USE 1 STRIP TO CHECK GLUCOSE AS DIRECTED DAILY    ? Accu-Chek Softclix Lancets lancets Once daily testing dx e11.9 50 each 1  ? acetaminophen (TYLENOL) 325 MG tablet Take 2 tablets (650 mg total) by mouth every 6 (six) hours as needed for mild pain or moderate pain. 40 tablet 0  ? amLODipine (NORVASC) 10 MG tablet Take 1 tablet (10 mg total) by mouth daily. 30 tablet 3  ? atorvastatin (LIPITOR) 10 MG tablet Take 10 mg by mouth at bedtime.    ? Cholecalciferol (VITAMIN D3) 25 MCG (1000 UT) CAPS Take 1 capsule (1,000 Units total) by mouth daily. 30 capsule 3  ? gabapentin (NEURONTIN) 600 MG tablet Take 600 mg by mouth 3 (three) times daily.    ? hydrALAZINE (APRESOLINE) 25 MG tablet Take 1 tablet (25 mg total) by mouth 3 (three) times daily. 60 tablet 2  ? lisinopril-hydrochlorothiazide (ZESTORETIC) 20-25 MG tablet Take 1 tablet by mouth daily.    ? Melatonin 5 MG CHEW Chew by mouth.    ? metFORMIN (GLUCOPHAGE-XR) 500 MG 24 hr tablet SMARTSIG:1 Tablet(s) By Mouth Every Evening    ? oxybutynin (DITROPAN XL) 10 MG 24 hr tablet Take 1 tablet (10 mg total) by mouth  daily. 30 tablet 12  ? zolpidem (AMBIEN) 5 MG tablet Take 5 mg by mouth at bedtime as needed.    ? ?No facility-administered medications prior to visit.  ? ? ?Allergies  ?Allergen Reactions  ? Aspirin   ?  INTERNAL BLEEDING  ? Tramadol Itching  ? ? ?ROS ?Review of Systems  ?Constitutional:  Negative for diaphoresis, fatigue and fever.  ?HENT:  Negative for congestion, dental  problem, drooling, sinus pressure, sinus pain and sore throat.   ?Eyes:  Negative for pain, redness and itching.  ?Respiratory:  Negative for cough, choking and chest tightness.   ?Cardiovascular:  Negative for chest pain and palpitations.  ?Gastrointestinal:  Negative for constipation, diarrhea, nausea and vomiting.  ?Endocrine: Negative for polydipsia, polyphagia and polyuria.  ?Genitourinary:  Negative for dysuria, enuresis and flank pain.  ?Musculoskeletal:  Negative for back pain and neck stiffness.  ?Skin:  Negative for rash and wound.  ?Neurological:  Negative for dizziness, weakness, light-headedness and numbness.  ?Psychiatric/Behavioral:  Negative for self-injury and suicidal ideas.   ? ?  ?Objective:  ?  ?Physical Exam ?Constitutional:   ?   Appearance: Normal appearance. She is obese.  ?HENT:  ?   Head: Normocephalic.  ?   Right Ear: External ear normal.  ?   Left Ear: External ear normal.  ?   Nose: No congestion or rhinorrhea.  ?   Mouth/Throat:  ?   Mouth: Mucous membranes are moist.  ?Eyes:  ?   General:     ?   Right eye: No discharge.     ?   Left eye: No discharge.  ?Cardiovascular:  ?   Rate and Rhythm: Normal rate and regular rhythm.  ?   Pulses: Normal pulses.  ?   Heart sounds: Normal heart sounds.  ?Pulmonary:  ?   Effort: Pulmonary effort is normal.  ?   Breath sounds: Normal breath sounds. No rhonchi.  ?Abdominal:  ?   General: Bowel sounds are normal.  ?Musculoskeletal:     ?   General: No swelling.  ?   Cervical back: Normal range of motion. No tenderness.  ?   Right lower leg: No edema.  ?Skin: ?   General: Skin is  warm.  ?   Findings: No lesion or rash.  ?Neurological:  ?   Mental Status: She is alert and oriented to person, place, and time.  ?   Motor: No weakness.  ?   Gait: Gait normal.  ?Psychiatric:  ?   [place]men

## 2021-12-02 NOTE — Patient Instructions (Signed)
I appreciate the opportunity to provide care to you today! ?  ?-Your BP looks good today! ? ?-I've put in a referral for home health to help you with mediation compliance ? ?  ?It was a pleasure to see you and I look forward to continuing to work together on your health and well-being. ?Please do not hesitate to call the office if you need care or have questions about your care. ?  ?Have a wonderful day and week. ?With Gratitude, ?Alvira Monday MSN, FNP-BC ? ?

## 2021-12-02 NOTE — Assessment & Plan Note (Addendum)
BP today is 138/88 ?Intermittent elevated BP likely due to medication non-compliance ?Referral made to home health ? ?

## 2021-12-05 DIAGNOSIS — G4739 Other sleep apnea: Secondary | ICD-10-CM

## 2021-12-05 NOTE — Procedures (Signed)
? ? ?  Patient Name: Gwendolyn Woodward, Gwendolyn Woodward ?Study Date: 11/25/2021 ?Gender: Female ?D.O.B: 04-25-1966 ?Age (years): 44 ?Referring Provider: Darlin Drop FNP ?Height (inches): 60 ?Interpreting Physician: Baird Lyons MD, ABSM ?Weight (lbs): 260 ?RPSGT: Carolin Coy ?BMI: 51 ?MRN: [id number] ?Neck Size: 14.00 ? ?CLINICAL INFORMATION ?Sleep Study Type: NPSG ?Indication for sleep study: Diabetes, Hypertension, Obesity ?Epworth Sleepiness Score: 4 ? ?SLEEP STUDY TECHNIQUE ?As per the AASM Manual for the Scoring of Sleep and Associated Events v2.3 (April 2016) with a hypopnea requiring 4% desaturations. ? ?The channels recorded and monitored were frontal, central and occipital EEG, electrooculogram (EOG), submentalis EMG (chin), nasal and oral airflow, thoracic and abdominal wall motion, anterior tibialis EMG, snore microphone, electrocardiogram, and pulse oximetry. ? ?MEDICATIONS ?Medications self-administered by patient taken the night of the study : LISINOPRIL/HCTZ ? ?SLEEP ARCHITECTURE ?The study was initiated at 10:25:55 PM and ended at 4:29:49 AM. ? ?Sleep onset time was 17.3 minutes and the sleep efficiency was 70.2%%. The total sleep time was 255.5 minutes. ? ?Stage REM latency was 101.0 minutes. ? ?The patient spent 17.6%% of the night in stage N1 sleep, 78.5%% in stage N2 sleep, 0.0%% in stage N3 and 3.9% in REM. ? ?Alpha intrusion was absent. ? ?Supine sleep was 29.35%. ? ?RESPIRATORY PARAMETERS ?The overall apnea/hypopnea index (AHI) was 14.8 per hour. There were 2 total apneas, including 2 obstructive, 0 central and 0 mixed apneas. There were 61 hypopneas and 105 RERAs. ? ?The AHI during Stage REM sleep was 66.0 per hour. ? ?AHI while supine was 18.4 per hour. ? ?The mean oxygen saturation was 95.4%. The minimum SpO2 during sleep was 83.0%. ? ?soft snoring was noted during this study. ? ?CARDIAC DATA ?The 2 lead EKG demonstrated sinus rhythm. The mean heart rate was 74.2 beats per minute. Other EKG findings  include: None. ? ?LEG MOVEMENT DATA ?The total PLMS were 0 with a resulting PLMS index of 0.0. Associated arousal with leg movement index was 0.9 . ? ?IMPRESSIONS ?- Mild to moderate obstructive sleep apnea occurred during this study (AHI = 14.8/h). ?- Insufficient early events and sleep to meet protocol requirement for split CPAP titration. ?- Mild oxygen desaturation was noted during this study (Min O2 = 83.0%).  Mean 95.6% ?- The patient snored with soft snoring volume. ?- No cardiac abnormalities were noted during this study. ?- Clinically significant periodic limb movements did not occur during sleep. No significant associated arousals. ? ?DIAGNOSIS ?- Obstructive Sleep Apnea (G47.33) ? ?RECOMMENDATIONS ?- Suggest CPAP titration sleep study. Other options would be based on clinical judgment ?- Be careful with alcohol, sedatives and other CNS depressants that may worsen sleep apnea and disrupt normal sleep architecture. ?- Sleep hygiene should be reviewed to assess factors that may improve sleep quality. ?- Weight management and regular exercise should be initiated or continued if appropriate. ? ?[Electronically signed] 12/05/2021 12:16 PM ? ?Baird Lyons MD, ABSM ?Diplomate, Tax adviser of Sleep Medicine ?NPI: [id number] ? ?  ? ? ? ? ? ? ? ? ? ? ? ? ? ? ? ? ? ? ? ? ? ?Murlean Seelye ?Diplomate, Tax adviser of Sleep Medicine ? ?ELECTRONICALLY SIGNED ON:  12/05/2021, 12:10 PM ?[place] ?PH: [phone]   FX: [phone] ?ACCREDITED BY THE AMERICAN ACADEMY OF SLEEP MEDICINE ?

## 2021-12-06 LAB — MICROALBUMIN / CREATININE URINE RATIO
Creatinine, Urine: 273.2 mg/dL
Microalb/Creat Ratio: 3 mg/g creat (ref 0–29)
Microalbumin, Urine: 7.1 ug/mL

## 2021-12-28 ENCOUNTER — Encounter: Payer: Self-pay | Admitting: *Deleted

## 2021-12-29 ENCOUNTER — Encounter: Payer: Self-pay | Admitting: Nurse Practitioner

## 2021-12-29 ENCOUNTER — Ambulatory Visit (INDEPENDENT_AMBULATORY_CARE_PROVIDER_SITE_OTHER): Payer: 59 | Admitting: Nurse Practitioner

## 2021-12-29 VITALS — BP 154/103 | HR 89 | Ht 61.0 in | Wt 264.0 lb

## 2021-12-29 DIAGNOSIS — E119 Type 2 diabetes mellitus without complications: Secondary | ICD-10-CM

## 2021-12-29 DIAGNOSIS — I10 Essential (primary) hypertension: Secondary | ICD-10-CM

## 2021-12-29 DIAGNOSIS — Z6841 Body Mass Index (BMI) 40.0 and over, adult: Secondary | ICD-10-CM

## 2021-12-29 DIAGNOSIS — F339 Major depressive disorder, recurrent, unspecified: Secondary | ICD-10-CM | POA: Diagnosis not present

## 2021-12-29 MED ORDER — ATORVASTATIN CALCIUM 10 MG PO TABS: 10.0000 mg | ORAL_TABLET | Freq: Every day | ORAL | 0 refills | Status: DC

## 2021-12-29 MED ORDER — LISINOPRIL-HYDROCHLOROTHIAZIDE 20-25 MG PO TABS: 1.0000 | ORAL_TABLET | Freq: Every day | ORAL | 1 refills | Status: DC

## 2021-12-29 MED ORDER — HYDRALAZINE HCL 25 MG PO TABS: 25.0000 mg | ORAL_TABLET | Freq: Three times a day (TID) | ORAL | 2 refills | Status: DC

## 2021-12-29 NOTE — Assessment & Plan Note (Addendum)
Chronic uncontrolled condition ?Refused med today denies SI, HI  ?PHQ9 score 17 ?Patient given CCM number to call for follow-up appointment for therapy. ? ?

## 2021-12-29 NOTE — Progress Notes (Signed)
? ?Gwendolyn Woodward     MRN: [id number]      DOB: 1966/05/25 ? ? ?HPI ?Gwendolyn Woodward with past medical history of hypertension, GERD, diabetes mellitus, obesity, recurrent depression is here for follow up for hypertension.  She is currently taking amlodipine 10 mg daily, hydralazine 25 mg once daily instead of 3 times daily, she has been out of lisinopril-hydrochlorothiazide 20-25 mg tablet.  Patient could not tell how long she has been out of lisinopril-hydrochlorothiazide 20-25 mg times.  Medication administration of current BP medications explained extensively to the patient today.  Patient verbalized understanding.  Patient denies chest pain, syncope, edema.  ? ?Patient would like to discuss weight loss she currently does not exercise, has history of diabetes and on metformin 500 mg daily.  ? ? ?ROS ?Denies recent fever or chills. ?Denies sinus pressure, nasal congestion, ear pain or sore throat. ?Denies chest congestion, productive cough or wheezing. ?Denies chest pains, palpitations and leg swelling ?Denies abdominal pain, nausea, vomiting,diarrhea or constipation.   ?Denies dysuria, frequency, hesitancy or incontinence. ?Denies joint pain, swelling and limitation in mobility. ? ? ? ? ? ?PE ? ?BP (!) 154/103   Pulse 89   Ht '5\' 1"'$  (1.549 m)   Wt 264 lb (119.7 kg)   SpO2 98%   BMI 49.88 kg/m?  ? ?Patient alert and oriented and in no cardiopulmonary distress. ? ?HEENT: No facial asymmetry, EOMI,     Neck supple . ? ?Chest: Clear to auscultation bilaterally. ? ?CVS: S1, S2 no murmurs, no S3.Regular rate. ? ?ABD: Soft non tender.  ? ?Ext: No edema ? ?MS: Adequate ROM spine, shoulders, hips and knees. ? ?Psych: Good eye contact, normal affect. Memory intact not anxious or depressed appearing. ? ?CNS: CN 2-12 intact, power,  normal throughout.no focal deficits noted. ? ? ?Assessment & Plan ? ?Hypertension ? ?BP Readings from Last 3 Encounters:  ?12/29/21 (!) 154/103  ?12/02/21 138/88  ?11/04/21 (!) 176/92  ?Chronic  condition well-controlled due to poor medication adherence and knowledge. ?She has been out of  Lisinopril-HTCZ 20-'25MG'$  tablets ,she not sure how when last she took the medication , states that she has been taking hydralazine '25mg'$  once daily  ?Need to take medications as ordered discussed with pt, Take  hydralazine '25mg'$  TID, Amlodipine '10mg'$  daily.lisiopril-HCTZ 20-'25mg'$  tablets daily refilled today.  Medication administration and dosing extensively discussed with patient today. ?Lisinopril-hydrochlorothiazide 20-25 mg tablets refilled ?DASH diet advised, engage in regular exercises at least 150 minutes weekly ?Referral to CCM for help with medication adherence ?Follow-up in 4 weeks ?Patient told to monitor blood pressure daily keep a log and bring to next follow-up ? ?Obesity ?Wt Readings from Last 3 Encounters:  ?12/29/21 264 lb (119.7 kg)  ?12/02/21 259 lb 3.2 oz (117.6 kg)  ?11/25/21 260 lb (117.9 kg)  ?Need to increase intake of whole food consisting mainly vegetables and protein less carbohydrate drinking at least 64 ounces of water daily, engaging in regular vigorous exercises as tolerated at least 150 minutes weekly, importance of portion control also discussed with patient ?Diabetic eye exam exam scheduled ?Will consider switching patient to Jardiance at next A1c check to assist with weight loss. ?  ? ? ?Diabetes mellitus ?Lab Results  ?Component Value Date  ? HGBA1C 6.4 (H) 10/23/2021  ? ? ?CURRENTLY on metformin 500 mg daily ?Foot exam completed today ?Diabetic eye exam exam scheduled ?Will consider switching patient to Jardiance at next A1c check to assist with weight loss. ?Avoid sugar sweets soda  cake, engage in regular exercises at least 150 minutes weekly ?  ? ?Depression, recurrent ([place]) ?Chronic uncontrolled condition ?Refused med today denies SI, HI  ?PHQ9 score 17 ?Patient given CCM number to call for follow-up appointment for therapy. ?  ?

## 2021-12-29 NOTE — Patient Instructions (Addendum)
Please take hydralazine '25mg'$  three times daily, Amlodipine '10mg'$  one time daily, lisiopril-HCTZ 20-'25mg'$ , take one tablet once a day.  ?Schedule your diabetic eye exam today  ? ?Please check your blood pressure once daily , write down the number and bring to your next appointment.  ? ? ?please call [place] at [phone] to schedule an appointment for your depression.  ?  ? ?It is important that you exercise regularly at least 30 minutes 5 times a week.  ?Think about what you will eat, plan ahead. ?Choose " clean, green, fresh or frozen" over canned, processed or packaged foods which are more sugary, salty and fatty. ?70 to 75% of food eaten should be vegetables and fruit. ?Three meals at set times with snacks allowed between meals, but they must be fruit or vegetables. ?Aim to eat over a 12 hour period , example 7 am to 7 pm, and STOP after  your last meal of the day. ?Drink water,generally about 64 ounces per day, no other drink is as healthy. Fruit juice is best enjoyed in a healthy way, by EATING the fruit. ? ?Thanks for choosing [facility], we consider it a privelige to serve you. ?  ? ?

## 2021-12-29 NOTE — Assessment & Plan Note (Addendum)
?  BP Readings from Last 3 Encounters:  ?12/29/21 (!) 154/103  ?12/02/21 138/88  ?11/04/21 (!) 176/92  ?Chronic condition well-controlled due to poor medication adherence and knowledge. ?She has been out of  Lisinopril-HTCZ 20-'25MG'$  tablets ,she not sure how when last she took the medication , states that she has been taking hydralazine '25mg'$  once daily  ?Need to take medications as ordered discussed with pt, Take  hydralazine '25mg'$  TID, Amlodipine '10mg'$  daily.lisiopril-HCTZ 20-'25mg'$  tablets daily refilled today.  Medication administration and dosing extensively discussed with patient today. ?Lisinopril-hydrochlorothiazide 20-25 mg tablets refilled ?DASH diet advised, engage in regular exercises at least 150 minutes weekly ?Referral to CCM for help with medication adherence ?Follow-up in 4 weeks ?Patient told to monitor blood pressure daily keep a log and bring to next follow-up ?

## 2021-12-29 NOTE — Assessment & Plan Note (Addendum)
Lab Results  ?Component Value Date  ? HGBA1C 6.4 (H) 10/23/2021  ? ? ?CURRENTLY on metformin 500 mg daily ?Foot exam completed today ?Diabetic eye exam exam scheduled ?Will consider switching patient to Jardiance at next A1c check to assist with weight loss. ?Avoid sugar sweets soda cake, engage in regular exercises at least 150 minutes weekly ?  ?

## 2021-12-29 NOTE — Assessment & Plan Note (Addendum)
Wt Readings from Last 3 Encounters:  ?12/29/21 264 lb (119.7 kg)  ?12/02/21 259 lb 3.2 oz (117.6 kg)  ?11/25/21 260 lb (117.9 kg)  ?Need to increase intake of whole food consisting mainly vegetables and protein less carbohydrate drinking at least 64 ounces of water daily, engaging in regular vigorous exercises as tolerated at least 150 minutes weekly, importance of portion control also discussed with patient ?Diabetic eye exam exam scheduled ?Will consider switching patient to Jardiance at next A1c check to assist with weight loss. ?  ? ?

## 2021-12-30 ENCOUNTER — Telehealth: Payer: Self-pay | Admitting: *Deleted

## 2021-12-30 NOTE — Chronic Care Management (AMB) (Signed)
?  Chronic Care Management ?Note ? ?12/30/2021 ?Name: NAZARET CHEA MRN: [id number] DOB: 1966/02/19 ? ?ASHLYNE OLENICK is a 55 y.o. year old female who is a primary care patient of Renee Rival, FNP and is actively engaged with the care management team. I reached out to Tereasa Coop by phone today to assist with scheduling an initial visit with the RN Case Manager ? ?Follow up plan: ?Unsuccessful telephone outreach attempt made. A HIPAA compliant phone message was left for the patient providing contact information and requesting a return call.  ?The care management team will reach out to the patient again over the next 3 days.  ?If patient returns call to provider office, please advise to call [place] at [phone]. ? ?Laverda Sorenson  ?Care Guide, Embedded Care Coordination ?[place]  Care Management  ?Direct Dial: [phone] ? ?

## 2021-12-31 NOTE — Chronic Care Management (AMB) (Signed)
?  Chronic Care Management ?Note ? ?12/31/2021 ?Name: Gwendolyn Woodward MRN: [id number] DOB: Jun 30, 1966 ? ?Gwendolyn Woodward is a 55 y.o. year old female who is a primary care patient of Renee Rival, FNP and is actively engaged with the care management team. I reached out to Tereasa Coop by phone today to assist with scheduling an initial visit with the RN Case Manager ? ?Follow up plan: ?Telephone appointment with care management team member scheduled for:01/11/22 ? ?Laverda Sorenson  ?Care Guide, Embedded Care Coordination ?[place]  Care Management  ?Direct Dial: [phone] ? ?

## 2022-01-05 ENCOUNTER — Other Ambulatory Visit: Payer: Self-pay | Admitting: Pharmacist

## 2022-01-05 DIAGNOSIS — I10 Essential (primary) hypertension: Secondary | ICD-10-CM

## 2022-01-05 NOTE — Patient Instructions (Addendum)
Gwendolyn Woodward,  ? ?It was great talking with you today! ? ?Here is how you should take your medications:  ? ?STOP THE HYDRALAZINE ? ?Morning: ?- Amlodipine 10 mg - blood pressure ?- Lisinopril/HCTZ 20/12.5 mg 2 tablets- blood pressure ?- Metformin XR 500 mg 2 tablets - blood sugar ?- Oxybutynin XL 10 mg daily - overactive bladder ?- Atorvastatin 10 mg - prevent heart attacks/strokes ? ?Bedtime: ?- Zolpidem 5 mg - sleep ?- Gabapentin 600 mg - nerve pain ? ?Call the Sports Medicine doctor to see about follow up regarding your shoulder and ankle pain.  ? ? ?Check your blood pressure twice weekly, and any time you have concerning symptoms like headache, chest pain, dizziness, shortness of breath, or vision changes.  ? ?Our goal is less than 130/80. ? ?To appropriately check your blood pressure, make sure you do the following:  ?1) Avoid caffeine, exercise, or tobacco products for 30 minutes before checking. Empty your bladder. ?2) Sit with your back supported in a flat-backed chair. Rest your arm on something flat (arm of the chair, table, etc). ?3) Sit still with your feet flat on the floor, resting, for at least 5 minutes.  ?4) Check your blood pressure. Take 1-2 readings.  ?5) Write down these readings and bring with you to any provider appointments. ? ?Bring your home blood pressure machine with you to a provider's office for accuracy comparison at least once a year.  ? ?Make sure you take your blood pressure medications before you come to any office visit, even if you were asked to fast for labs. ? ?

## 2022-01-05 NOTE — Chronic Care Management (AMB) (Signed)
? ?   ? ?Chief Complaint  ?Patient presents with  ? Hypertension  ? ? ?Gwendolyn Woodward is a 56 y.o. year old female who was referred for medication management by their primary care provider, Renee Rival, FNP. They presented for a telephone visit. ?  ?They were referred to the pharmacist by their PCP for assistance in managing diabetes, hypertension, and hyperlipidemia.  ? ?Subjective: ? ?Care Team: ?Primary Care Provider: Renee Rival, FNP ; Next Scheduled Visit: 01/26/22 ?Orthopedist: reports she previously saw sports medicine and was to have surgery completed on either her ankle or shoulder, but has not heard anything about this ? ?Medication Access/Adherence ? ?Current Pharmacy:  ?[id number] [place] [address]?Phone: [phone]: [phone]? ? ?Patient reports affordability concerns with their medications: No  ?Patient reports access/transportation concerns to their pharmacy: No  ?Patient reports adherence concerns with their medications:  Yes  yes - reports she is overwhelmed by how many medications she is prescribed; she doesn't know what each medication is for.  ? ? ?Diabetes: ? ?Current medications: metformin XR 1000 mg daily ? ?Current meal patterns: uses kids plate with dividers, tries to drink water before meals  ?- Breakfast: sometimes skips; 2 waffles, sometimes cereal ?- Supper: cereal, sometimes take out ([employer], [employer], [employer] ?- Snacks: used to eat a lot of candy, but doesn't as much now; sometimes will buy baked goods and tries to portion control, but has a hard time ?- Drinks: water, sweet tea ? ?Physical activity: limited by ankle and shoulder pain ? ?Hypertension: ? ?Current medications: lisinopril 20/25 mg daily, amlodipine 10 mg daily, hydralazine 25 mg three times daily - difficult to take  ?Medications previously tried:  ? ?Patient has a validated, automated, upper arm home BP  cuff, has not been checking readings.  ? ? ?Hyperlipidemia/ASCVD Risk Reduction ? ?Current lipid lowering medications: atorvastatin 10 mg daily ? ?Health Maintenance ? ?Health Maintenance Due  ?Topic Date Due  ? COVID-19 Vaccine (1) Never done  ? HIV Screening  Never done  ? Hepatitis C Screening  Never done  ? Zoster Vaccines- Shingrix (1 of 2) Never done  ? OPHTHALMOLOGY EXAM  10/27/2017  ?  ? ?Objective: ?Lab Results  ?Component Value Date  ? HGBA1C 6.4 (H) 10/23/2021  ? ? ?Lab Results  ?Component Value Date  ? CREATININE 1.05 (H) 10/23/2021  ? BUN 24 10/23/2021  ? NA 143 10/23/2021  ? K 4.1 10/23/2021  ? CL 98 10/23/2021  ? CO2 21 10/23/2021  ? ? ?Lab Results  ?Component Value Date  ? CHOL 212 (H) 10/23/2021  ? HDL 73 10/23/2021  ? LDLCALC 118 (H) 10/23/2021  ? TRIG 119 10/23/2021  ? CHOLHDL 2.9 10/23/2021  ? ? ?Medications Reviewed Today   ? ? Reviewed by HOsker Mason RPH-CPP (Pharmacist) on 01/05/22 at 1513  Med List Status: <None>  ? ?Medication Order Taking? Sig Documenting Provider Last Dose Status Informant  ?ACCU-CHEK GUIDE test strip [id number] USE 1 STRIP TO CHECK GLUCOSE AS DIRECTED DAILY  ?Patient not taking: Reported on 01/05/2022  ? [provider] Not Taking Active   ?Accu-Chek Softclix Lancets lancets [id number] Once daily testing dx e11.9 [place], FDewaine Conger FNP Taking Active   ?acetaminophen (TYLENOL) 325 MG tablet [id number] Take 2 tablets (650 mg total) by mouth every 6 (six) hours as needed for mild pain or moderate pain. McClung,  Leary Roca, PA-C Taking Active   ?amLODipine (NORVASC) 10 MG tablet [id number] Yes Take 1 tablet (10 mg total) by mouth daily. Estill Dooms, NP Taking Active   ?atorvastatin (LIPITOR) 10 MG tablet [id number] Yes Take 1 tablet (10 mg total) by mouth at bedtime. Renee Rival, FNP Taking Active   ?Cholecalciferol (VITAMIN D3) 25 MCG (1000 UT) CAPS [id number] Yes Take 1 capsule (1,000 Units total) by mouth daily. Renee Rival, FNP  Taking Active   ?gabapentin (NEURONTIN) 600 MG tablet [id number] Yes Take 600 mg by mouth 3 (three) times daily. [provider] Taking Active   ?hydrALAZINE (APRESOLINE) 25 MG tablet [id number] Yes Take 1 tablet (25 mg total) by mouth 3 (three) times daily. Renee Rival, FNP Taking Active   ?lisinopril-hydrochlorothiazide (ZESTORETIC) 20-25 MG tablet [id number] Yes Take 1 tablet by mouth daily. Renee Rival, FNP Taking Active   ?Melatonin 5 MG CHEW [id number]  Chew by mouth. [provider]  Active Self  ?metFORMIN (GLUCOPHAGE-XR) 500 MG 24 hr tablet [id number] Yes Take 1,000 mg by mouth daily. [provider] Taking Active   ?oxybutynin (DITROPAN XL) 10 MG 24 hr tablet [id number] Yes Take 1 tablet (10 mg total) by mouth daily. Estill Dooms, NP Taking Active   ?rizatriptan (MAXALT) 10 MG tablet [id number] No Take 10 mg by mouth as needed for migraine. TAKE 1 TABLET BY MOUTH ONCE, MAY REPEAT AT 2 HOUR INTERVALS; DO NO EXCEED 30 MG IN 24 HOURS  ?Patient not taking: Reported on 01/05/2022  ? [provider] Not Taking Active Self  ?zolpidem (AMBIEN) 5 MG tablet [id number] Yes Take 5 mg by mouth at bedtime as needed. [provider] Taking Active   ? ?  ?  ? ?  ? ? ?Diabetes: ?- Currently controlled ?- Patient very frustrated by being unable to lose weight. Recommend consideration for GLP1 moving forward.  ?- Extensive dietary counseling regarding focus on protein, vegetables, as well as increasing physical activity as able ? ?Hypertension: ?- Currently uncontrolled ?- Reviewed long term cardiovascular and renal outcomes of uncontrolled blood pressure ?- Reviewed appropriate blood pressure monitoring technique and reviewed goal blood pressure. Recommended to check home blood pressure and heart rate twice weekly ?- Recommend to stop hydralazine as patient is unable to be adherent to a three times daily medication. Increase lisinopril/HCTZ to 40/25 mg daily.  Continue amlodipine 10 mg daily. Discussed with PCP, in agreement. Order placed for co-sign.  ? ?Hyperlipidemia/ASCVD Risk Reduction: ?- Currently uncontrolled but anticipated to be improved ?- Reviewed long term complications of uncontrolled cholesterol ?- Recommended to continue current regimen at this time ? ?Follow Up Plan: phone call in 2 weeks ? ?Catie Hedwig Morton, PharmD, BCACP ?[place] ?[phone] ? ? ? ?

## 2022-01-06 MED ORDER — LISINOPRIL-HYDROCHLOROTHIAZIDE 20-12.5 MG PO TABS: 2.0000 | ORAL_TABLET | Freq: Every day | ORAL | 1 refills | Status: DC

## 2022-01-11 ENCOUNTER — Telehealth: Payer: Self-pay | Admitting: *Deleted

## 2022-01-11 ENCOUNTER — Telehealth: Payer: 59

## 2022-01-11 ENCOUNTER — Telehealth: Payer: Self-pay

## 2022-01-11 NOTE — Telephone Encounter (Signed)
  Care Management   Follow Up Note   01/11/2022 Name: Gwendolyn Woodward MRN: [id number] DOB: [birth year]   Referred by: Renee Rival, FNP Reason for referral : Chronic Care Management (HTN, DM2)   An unsuccessful telephone outreach was attempted today. The patient was referred to the case management team for assistance with care management and care coordination.   Follow Up Plan: Telephone follow up appointment with care management team member scheduled for:  upon care guide rescheduling.  Jacqlyn Larsen [facility], BSN RN Case Manager [facility] [phone]

## 2022-01-12 ENCOUNTER — Ambulatory Visit: Payer: 59

## 2022-01-12 LAB — HM DIABETES EYE EXAM

## 2022-01-15 ENCOUNTER — Ambulatory Visit (INDEPENDENT_AMBULATORY_CARE_PROVIDER_SITE_OTHER): Payer: 59 | Admitting: Licensed Clinical Social Worker

## 2022-01-15 DIAGNOSIS — F339 Major depressive disorder, recurrent, unspecified: Secondary | ICD-10-CM

## 2022-01-15 DIAGNOSIS — F39 Unspecified mood [affective] disorder: Secondary | ICD-10-CM

## 2022-01-15 DIAGNOSIS — I10 Essential (primary) hypertension: Secondary | ICD-10-CM

## 2022-01-15 NOTE — Patient Instructions (Addendum)
Visit Information  Patient Goals:  Depressive symptoms identified. Manage depression issues  Time Frame: Short Term Goal Priority:  High Progress:  On Track  Start Date:  10/27/21 End Date:  04/15/22    Follow Up Date:  02/18/22 at 3:00 PM   Depressive Symptoms Identified. Manage depression issues  Patient Coping Skills: Goes to [facility] to exercise Attends medical appointments  Patient Deficits Depression Weight challenges  Patient Goals: Attend medical appointments in next 30 days Take medications as prescribed Try to have one or two social interactions weekly to build friendships with others  Follow up Plan:  LCSW to call client on 02/18/22 at 3:00 PM   Norva Riffle.Harold Moncus MSW, [place] Holiday representative [facility] Care Management [phone]
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
## 2022-01-20 ENCOUNTER — Ambulatory Visit: Payer: Self-pay

## 2022-01-20 ENCOUNTER — Telehealth: Payer: Self-pay | Admitting: Pharmacist

## 2022-01-20 DIAGNOSIS — F32A Depression, unspecified: Secondary | ICD-10-CM

## 2022-01-20 DIAGNOSIS — I10 Essential (primary) hypertension: Secondary | ICD-10-CM

## 2022-01-20 NOTE — Telephone Encounter (Signed)
Attempted to call patient for follow up appointment. Left voicemail for her to return my call at her convenience.   If she does not call back by the end of the day, will attempt outreach again next week.   Catie Hedwig Morton, PharmD, [facility] [phone]

## 2022-01-21 ENCOUNTER — Other Ambulatory Visit: Payer: Self-pay | Admitting: Nurse Practitioner

## 2022-01-26 ENCOUNTER — Ambulatory Visit (INDEPENDENT_AMBULATORY_CARE_PROVIDER_SITE_OTHER): Payer: 59 | Admitting: Nurse Practitioner

## 2022-01-26 ENCOUNTER — Encounter: Payer: Self-pay | Admitting: Nurse Practitioner

## 2022-01-26 VITALS — BP 169/99 | HR 83 | Ht 61.0 in | Wt 262.0 lb

## 2022-01-26 DIAGNOSIS — I10 Essential (primary) hypertension: Secondary | ICD-10-CM

## 2022-01-26 DIAGNOSIS — E7800 Pure hypercholesterolemia, unspecified: Secondary | ICD-10-CM | POA: Diagnosis not present

## 2022-01-26 DIAGNOSIS — E119 Type 2 diabetes mellitus without complications: Secondary | ICD-10-CM | POA: Diagnosis not present

## 2022-01-26 DIAGNOSIS — G8929 Other chronic pain: Secondary | ICD-10-CM

## 2022-01-26 DIAGNOSIS — F339 Major depressive disorder, recurrent, unspecified: Secondary | ICD-10-CM

## 2022-01-26 DIAGNOSIS — M25511 Pain in right shoulder: Secondary | ICD-10-CM

## 2022-01-26 MED ORDER — ESCITALOPRAM OXALATE 10 MG PO TABS: 10.0000 mg | ORAL_TABLET | Freq: Every day | ORAL | 0 refills | Status: DC

## 2022-01-26 NOTE — Progress Notes (Signed)
   Gwendolyn Woodward     MRN: [id number]      DOB: Feb 19, 1966   HPI Ms. Sprung with past medical history of hypertension, GERD, diabetes mellitus, rotator cuff syndrome, hyperlipidemia, depression is here for follow up for hypertension.  Patient stated that she has been taking amlodipine 10 mg daily, lisinopril- hydrochlorothiazide 20-12.'5mg'$  , 2 tablets daily.  However patient states that she has not taken her medications today , she had to go pay her pills today and did not get a chance to take her medications before coming for her visit.  Patient denies chest pain, dizziness,   Depression.  She was seeing therapist at [facility] in the past, she was referred to CCM for counseling at her previous visit, she is currently established withLCSW for counseling, phq 9 score 17 today,  patient agreed to starting Lexapro 10 mg daily today denies SI, HI      ROS Denies recent fever or chills. Denies sinus pressure, nasal congestion, ear pain or sore throat. Denies chest congestion, productive cough or wheezing. Denies chest pains, palpitations and leg swelling Denies abdominal pain, nausea, vomiting,diarrhea or constipation.   Denies dysuria, frequency, hesitancy or incontinence. Denies headaches, seizures, numbness, or tingling. Marland Kitchen    PE  BP (!) 169/99 (BP Location: Right Arm, Cuff Size: Large)   Pulse 83   Ht '5\' 1"'$  (1.549 m)   Wt 262 lb (118.8 kg)   SpO2 96%   BMI 49.50 kg/m   Patient alert and oriented and in no cardiopulmonary distress.  Chest: Clear to auscultation bilaterally.  CVS: S1, S2 no murmurs, no S3.Regular rate.  ABD: Soft non tender.   Ext: No edema  MS: Adequate ROM spine, shoulders, hips and knees.  Psych: Good eye contact, normal affect. Memory intact not anxious or depressed appearing.  CNS: CN 2-12 intact, power,  normal throughout.no focal deficits noted.   Assessment & Plan  Hypertension BP Readings from Last 3 Encounters:  01/26/22 (!) 169/99   12/29/21 (!) 154/103  12/02/21 138/88    Patient stated that she has been taking amlodipine 10 mg daily, lisinopril- hydrochlorothiazide 20-12.'5mg'$  , 2 tablets daily.  However patient states that she has not taken her medications today , she had to go pay her pills today and did not get a chance to take her medications before coming for her visit.  Patient denies chest pain, dizziness. Continue current medication DASH diet advised engage in regular daily exercises at least 150 minutes weekly Follow-up in 6 weeks Patient encouraged to monitor blood pressure at home and keep a log Appreciate collaboration with clinical pharmacist.   Diabetes mellitus Lab Results  Component Value Date   HGBA1C 6.4 (H) 10/23/2021  Currently on metformin 1000 mg daily Check A1c at next visit Avoid sugar soda juice  Depression, recurrent (HCC) PHQ 9 score 17 Start Lexapro 10 mg daily Need to take medication as prescribed discussed with patient Follow-up in 6 weeks Continue to obtain therapy Denies SI, HI  Chronic right shoulder pain Currently on Neurontin 600 mg 3 times daily Patient encouraged to continue Neurontin as ordered, take Tylenol 650 mg every 6 hours as needed.

## 2022-01-26 NOTE — Assessment & Plan Note (Signed)
Lab Results  Component Value Date   HGBA1C 6.4 (H) 10/23/2021  Currently on metformin 1000 mg daily Check A1c at next visit Avoid sugar soda juice

## 2022-01-26 NOTE — Assessment & Plan Note (Signed)
Currently on Neurontin 600 mg 3 times daily Patient encouraged to continue Neurontin as ordered, take Tylenol 650 mg every 6 hours as needed.

## 2022-01-26 NOTE — Patient Instructions (Addendum)
Please call the social worker on this number to schedule your appointment for therapy for your depression. [phone]  Please start taking lexapro '10mg'$  daily for your depression.    Please get your fasting labs done 3-5 days before  your next visit   It is important that you exercise regularly at least 30 minutes 5 times a week.  Think about what you will eat, plan ahead. Choose " clean, green, fresh or frozen" over canned, processed or packaged foods which are more sugary, salty and fatty. 70 to 75% of food eaten should be vegetables and fruit. Three meals at set times with snacks allowed between meals, but they must be fruit or vegetables. Aim to eat over a 12 hour period , example 7 am to 7 pm, and STOP after  your last meal of the day. Drink water,generally about 64 ounces per day, no other drink is as healthy. Fruit juice is best enjoyed in a healthy way, by EATING the fruit.  Thanks for choosing [facility], we consider it a privelige to serve you.

## 2022-01-26 NOTE — Assessment & Plan Note (Signed)
BP Readings from Last 3 Encounters:  01/26/22 (!) 169/99  12/29/21 (!) 154/103  12/02/21 138/88    Patient stated that she has been taking amlodipine 10 mg daily, lisinopril- hydrochlorothiazide 20-12.'5mg'$  , 2 tablets daily.  However patient states that she has not taken her medications today , she had to go pay her pills today and did not get a chance to take her medications before coming for her visit.  Patient denies chest pain, dizziness. Continue current medication DASH diet advised engage in regular daily exercises at least 150 minutes weekly Follow-up in 6 weeks Patient encouraged to monitor blood pressure at home and keep a log Appreciate collaboration with clinical pharmacist.

## 2022-01-26 NOTE — Assessment & Plan Note (Signed)
PHQ 9 score 17 Start Lexapro 10 mg daily Need to take medication as prescribed discussed with patient Follow-up in 6 weeks Continue to obtain therapy Denies SI, HI

## 2022-02-01 ENCOUNTER — Other Ambulatory Visit: Payer: Self-pay | Admitting: Pharmacist

## 2022-02-01 DIAGNOSIS — I10 Essential (primary) hypertension: Secondary | ICD-10-CM

## 2022-02-01 NOTE — Chronic Care Management (AMB) (Signed)
Chief Complaint  Patient presents with   Hypertension    Gwendolyn Woodward is a 56 y.o. year old female who presented for a telephone visit.   They were referred to the pharmacist by their PCP for assistance in managing hypertension.    Subjective:  Care Team: Primary Care Provider: Renee Rival, FNP ; Next Scheduled Visit: 03/09/22  Medication Access/Adherence  [id number] [place] [id number]: [phone]: [phone]  Patient reports affordability concerns with their medications: No  Patient reports access/transportation concerns to their pharmacy: No  Patient reports adherence concerns with their medications:  Yes  reports she gets confused about what medications should be taken when. Has all of her medications in a bag.    Patient asks about pharmacies that offer adherence packaging in her area   Diabetes:  Current medications: metformin XR 1000 mg     Hypertension:  Current medications: lisinopril/HCTZ 20/12.5 - 2 tablets daily, amlodipine 10 mg daily  Patient has a validated, automated, upper arm home BP cuff. [facility] reviewed how to use today.  Current blood pressure readings readings: prior to meds today: 186/112, HR 79   Hyperlipidemia/ASCVD Risk Reduction  Current lipid lowering medications: atorvastatin 10 mg daily - though she does not read it off as one of her pills in her bag today  Depression/Anxiety  Current medications: prescribed escitalopram 10 mg daily but has not picked up from the pharmacy yet.   Engaged with embedded LDadevilleMaintenance Due  Topic Date Due   COVID-19 Vaccine (1) Never done   HIV Screening  Never done   Hepatitis C Screening  Never done   Zoster Vaccines- Shingrix (1 of 2) Never done   COLONOSCOPY (Pts 45-445yrInsurance coverage will need to be  confirmed)  01/26/2022     Objective: Lab Results  Component Value Date   HGBA1C 6.4 (H) 10/23/2021    Lab Results  Component Value Date   CREATININE 1.05 (H) 10/23/2021   BUN 24 10/23/2021   NA 143 10/23/2021   K 4.1 10/23/2021   CL 98 10/23/2021   CO2 21 10/23/2021    Lab Results  Component Value Date   CHOL 212 (H) 10/23/2021   HDL 73 10/23/2021   LDLCALC 118 (H) 10/23/2021   TRIG 119 10/23/2021   CHOLHDL 2.9 10/23/2021    Medications Reviewed Today     Reviewed by HaOsker MasonRPH-CPP (Pharmacist) on 02/01/22 at 08White House StationMed List Status: <None>   Medication Order Taking? Sig Documenting Provider Last Dose Status Informant  ACCU-CHEK GUIDE test strip [id number] [provider]  Active   Accu-Chek Softclix Lancets lancets [id number] daily testing dx e11.9 PaRenee RivalFNP  Active   acetaminophen (TYLENOL) 325 MG tablet [id number] 2 tablets (650 mg total) by mouth every 6 (six) hours as needed for mild pain or moderate pain. McCorinne PortsPA-C  Active   amLODipine (NORVASC) 10 MG tablet [id number] Take 1 tablet (10 mg total) by mouth daily. GrEstill DoomsNP Taking Active   atorvastatin (LIPITOR) 10 MG tablet [id number] Take 1 tablet (10 mg total) by mouth at bedtime. PaRenee RivalFNP Taking Active   Cholecalciferol (VITAMIN D3) 25 MCG (1000 UT) CAPS [id number] Take 1 capsule (  1,000 Units total) by mouth daily. Renee Rival, FNP Taking Active   escitalopram (LEXAPRO) 10 MG tablet [id number]  Take 1 tablet (10 mg total) by mouth daily. Renee Rival, FNP  Active   gabapentin (NEURONTIN) 600 MG tablet [id number] Yes Take 600 mg by mouth 3 (three) times daily. [provider] Taking Active   lisinopril-hydrochlorothiazide (ZESTORETIC) 20-12.5 MG tablet [id number] Yes Take 2 tablets by mouth daily. Fayrene Helper, MD Taking Active   metFORMIN (GLUCOPHAGE-XR) 500 MG 24 hr tablet [id number] Yes  Take 1,000 mg by mouth daily. [provider] Taking Active   oxybutynin (DITROPAN XL) 10 MG 24 hr tablet [id number] Yes Take 1 tablet (10 mg total) by mouth daily. Estill Dooms, NP Taking Active   rizatriptan (MAXALT) 10 MG tablet [id number]  Take 10 mg by mouth as needed for migraine. TAKE 1 TABLET BY MOUTH ONCE, MAY REPEAT AT 2 HOUR INTERVALS; DO NO EXCEED 30 MG IN 24 HOURS  Patient not taking: Reported on 01/05/2022   [provider]  Active Self  zolpidem (AMBIEN) 5 MG tablet [id number]  Take 5 mg by mouth at bedtime as needed. [provider]  Active               Assessment/Plan:   Medication Management: - Patient requests information on adherence packaging pharmacies in the area. Discussed. She is amenable to having scripts transferred to [facility] for adherence packaging. [facility] The patient will take her current supply to the pharmacy to be included.   Diabetes: - Currently controlled - Recommend to continue current regimen at this time   Hypertension: - Currently uncontrolled - Reviewed long term cardiovascular and renal outcomes of uncontrolled blood pressure - Reviewed appropriate blood pressure monitoring technique and reviewed goal blood pressure. Recommended to check home blood pressure and heart rate daily - Recommend to continue current regimen - adherence packaging anticipated to help  Hyperlipidemia/ASCVD Risk Reduction: - Currently uncontrolled but adherence packaging anticipated to help - Recommend to continue current regimen at this time   Depression/Anxiety: - Currently uncontrolled - Encouraged to start escitalopram. Continue collaboration with LCSW   Follow Up Plan: phone call tomorrow to follow up on home readings  Catie Hedwig Morton, PharmD, [place] [phone]

## 2022-02-02 ENCOUNTER — Ambulatory Visit: Payer: Self-pay

## 2022-02-02 ENCOUNTER — Telehealth: Payer: Self-pay | Admitting: Pharmacist

## 2022-02-02 NOTE — Telephone Encounter (Signed)
Attempted to call patient to follow up on transferring medications to [facility] and to see if she has checked her blood pressure today. Left voicemail for her to return my call at her convenience.   If she does not return call by end of day, I will attempt outreach tomorrow.   Catie Hedwig Morton, PharmD, [facility] [phone]

## 2022-02-03 ENCOUNTER — Telehealth: Payer: Self-pay | Admitting: *Deleted

## 2022-02-03 NOTE — Chronic Care Management (AMB) (Signed)
  Chronic Care Management Note  02/03/2022 Name: Gwendolyn Woodward MRN: [id number] DOB: 06-Jan-1966  Gwendolyn Woodward is a 56 y.o. year old female who is a primary care patient of Renee Rival, FNP and is actively engaged with the care management team. I reached out to Tereasa Coop by phone today to assist with re-scheduling an initial visit with the RN Case Manager  Follow up plan: Unsuccessful telephone outreach attempt made. A HIPAA compliant phone message was left for the patient providing contact information and requesting a return call.  The care management team will reach out to the patient again over the next 7 days.  If patient returns call to provider office, please advise to call [place] at [phone].  [place] Management  Direct Dial: [phone]

## 2022-02-04 NOTE — Chronic Care Management (AMB) (Unsigned)
  Chronic Care Management Note  02/04/2022 Name: [name] MRN: [id number] DOB: [birth year]  Gwendolyn Woodward is a 56 y.o. year old female who is a primary care patient of Renee Rival, FNP and is actively engaged with the care management team. I reached out to Tereasa Coop by phone today to assist with re-scheduling an initial visit with the RN Case Manager  Follow up plan: Unsuccessful telephone outreach attempt made. A HIPAA compliant phone message was left for the patient providing contact information and requesting a return call.  The care management team will reach out to the patient again over the next 7 days.  If patient returns call to provider office, please advise to call [place] at [phone].  [place] Management  Direct Dial: [phone]

## 2022-02-05 ENCOUNTER — Ambulatory Visit: Payer: Self-pay

## 2022-02-09 NOTE — Chronic Care Management (AMB) (Signed)
  Chronic Care Management Note  02/09/2022 Name: Gwendolyn Woodward MRN: [id number] DOB: 1966-02-16  Gwendolyn Woodward is a 56 y.o. year old female who is a primary care patient of Renee Rival, FNP and is actively engaged with the care management team. I reached out to Tereasa Coop by phone today to assist with re-scheduling an initial visit with the RN Case Manager  Follow up plan: A third unsuccessful telephone outreach attempt made. A HIPAA compliant phone message was left for the patient providing contact information and requesting a return call. Unable to make contact on outreach attempts x 3. PCP Renee Rival, FNP notified via routed documentation in medical record. We have been unable to make contact with the patient for follow up. The care management team is available to follow up with the patient after provider conversation with the patient regarding recommendation for care management engagement and subsequent re-referral to the care management team.   [place] Management  Direct Dial: [phone]

## 2022-02-15 ENCOUNTER — Telehealth: Payer: Self-pay | Admitting: Pharmacist

## 2022-02-15 ENCOUNTER — Ambulatory Visit: Payer: Self-pay

## 2022-02-18 ENCOUNTER — Other Ambulatory Visit: Payer: Self-pay

## 2022-02-18 ENCOUNTER — Telehealth: Payer: 59

## 2022-02-18 DIAGNOSIS — E119 Type 2 diabetes mellitus without complications: Secondary | ICD-10-CM

## 2022-02-18 DIAGNOSIS — F339 Major depressive disorder, recurrent, unspecified: Secondary | ICD-10-CM

## 2022-03-09 ENCOUNTER — Encounter: Payer: Self-pay | Admitting: Nurse Practitioner

## 2022-03-09 ENCOUNTER — Ambulatory Visit (INDEPENDENT_AMBULATORY_CARE_PROVIDER_SITE_OTHER): Payer: 59 | Admitting: Nurse Practitioner

## 2022-03-09 VITALS — BP 195/112 | HR 77 | Ht 60.0 in | Wt 267.0 lb

## 2022-03-09 DIAGNOSIS — Z1211 Encounter for screening for malignant neoplasm of colon: Secondary | ICD-10-CM | POA: Diagnosis not present

## 2022-03-09 DIAGNOSIS — F339 Major depressive disorder, recurrent, unspecified: Secondary | ICD-10-CM | POA: Diagnosis not present

## 2022-03-09 DIAGNOSIS — Z91199 Patient's noncompliance with other medical treatment and regimen due to unspecified reason: Secondary | ICD-10-CM | POA: Insufficient documentation

## 2022-03-09 DIAGNOSIS — E782 Mixed hyperlipidemia: Secondary | ICD-10-CM

## 2022-03-09 DIAGNOSIS — I10 Essential (primary) hypertension: Secondary | ICD-10-CM | POA: Diagnosis not present

## 2022-03-09 DIAGNOSIS — E1169 Type 2 diabetes mellitus with other specified complication: Secondary | ICD-10-CM

## 2022-03-09 MED ORDER — OXYBUTYNIN CHLORIDE ER 10 MG PO TB24: 10.0000 mg | ORAL_TABLET | Freq: Every day | ORAL | 12 refills | Status: DC

## 2022-03-09 MED ORDER — LABETALOL HCL 100 MG PO TABS: 100.0000 mg | ORAL_TABLET | Freq: Two times a day (BID) | ORAL | 3 refills | Status: DC

## 2022-03-09 MED ORDER — AMLODIPINE BESYLATE 10 MG PO TABS: 10.0000 mg | ORAL_TABLET | Freq: Every day | ORAL | 1 refills | Status: DC

## 2022-03-09 MED ORDER — ATORVASTATIN CALCIUM 10 MG PO TABS: 10.0000 mg | ORAL_TABLET | Freq: Every day | ORAL | 0 refills | Status: DC

## 2022-03-09 MED ORDER — METFORMIN HCL ER 500 MG PO TB24: 1000.0000 mg | ORAL_TABLET | Freq: Every day | ORAL | 1 refills | Status: DC

## 2022-03-09 MED ORDER — AMLODIPINE BESYLATE 10 MG PO TABS
10.0000 mg | ORAL_TABLET | Freq: Every day | ORAL | 1 refills | Status: DC
Start: 2022-03-09 — End: 2022-03-09

## 2022-03-09 MED ORDER — LISINOPRIL-HYDROCHLOROTHIAZIDE 20-12.5 MG PO TABS: 2.0000 | ORAL_TABLET | Freq: Every day | ORAL | 1 refills | Status: DC

## 2022-03-09 MED ORDER — ESCITALOPRAM OXALATE 10 MG PO TABS: 10.0000 mg | ORAL_TABLET | Freq: Every day | ORAL | 0 refills | Status: DC

## 2022-03-09 MED ORDER — VITAMIN D3 25 MCG (1000 UT) PO CAPS: 1000.0000 [IU] | ORAL_CAPSULE | Freq: Every day | ORAL | 3 refills | Status: DC

## 2022-03-09 NOTE — Assessment & Plan Note (Signed)
Lexapro 10 mg daily refilled Denies SI, HI

## 2022-03-09 NOTE — Patient Instructions (Addendum)
Please go to the pharmacy and pick up your prescriptions as discussed , please take your medication as ordered to prevent risk of heart attack, stroke and kidney damage.   Please make sure to take your medications before coming here on your day of appointment    Please start taking labetalol '100mg'$  two times daily , continue amlodpine '10mg'$  daily , lisinopril hydrochlorothiazide ( 2 tablet daily).    Please call Catie the pharmacist on  [phone]  she is helping [place] to make sure you are taking     It is important that you exercise regularly at least 30 minutes 5 times a week.  Think about what you will eat, plan ahead. Choose " clean, green, fresh or frozen" over canned, processed or packaged foods which are more sugary, salty and fatty. 70 to 75% of food eaten should be vegetables and fruit. Three meals at set times with snacks allowed between meals, but they must be fruit or vegetables. Aim to eat over a 12 hour period , example 7 am to 7 pm, and STOP after  your last meal of the day. Drink water,generally about 64 ounces per day, no other drink is as healthy. Fruit juice is best enjoyed in a healthy way, by EATING the fruit.  Thanks for choosing [facility], we consider it a privelige to serve you. Lisinopril-hydrochlorothiazide

## 2022-03-09 NOTE — Assessment & Plan Note (Signed)
Lab Results  Component Value Date   HGBA1C 6.4 (H) 10/23/2021  She has been out of metformin 1000 mg Metformin 1000 mg daily refilled Avoid sugar sweets soda We will check A1c at next visit

## 2022-03-09 NOTE — Assessment & Plan Note (Signed)
BP Readings from Last 3 Encounters:  03/09/22 (!) 195/112  01/26/22 (!) 169/99  12/29/21 (!) 154/103   Chronic condition uncontrolled Patient stated that she has been out of her medication since the past 2 weeks Amlodipine 10 mg daily, lisinopril- hydrochlorothiazide 20-12.5 mg, 2 tablets daily refilled.  Start labetalol 100 mg twice daily Need to take all medications as prescribed discussed with patient. Follow-up in 2 weeks for blood pressure recheck

## 2022-03-09 NOTE — Assessment & Plan Note (Signed)
Patient has been out of her medications for the past 2 weeks I called the patient's daughter on the phone to discuss the patient's nonadherence to medical treatment.  I encouraged patient's daughter to call the patient daily to remind her to take her medications.  Patient stated that she will not answer her daughters phone call for the next 2 weeks.  patient encouraged to call the clinical pharmacist and maintain close follow-up with her to assist with medication adherence. Medication ordered in bubble packs to assist with medication adherence. Need to take all medications as ordered described with the patient.  Patient is being followed by CCM

## 2022-03-09 NOTE — Assessment & Plan Note (Signed)
Atorvastatin 10 mg daily refilled Lab Results  Component Value Date   CHOL 212 (H) 10/23/2021   HDL 73 10/23/2021   LDLCALC 118 (H) 10/23/2021   TRIG 119 10/23/2021   CHOLHDL 2.9 10/23/2021

## 2022-03-09 NOTE — Progress Notes (Signed)
Gwendolyn Woodward     MRN: [id number]      DOB: July 18, 1966   HPI Gwendolyn Woodward with past medical history of hypertension, depression, type 2 diabetes, hyperlipidemia, morbid obesity is here for follow up for hypertension and depression     Pt stated that  she has been out of blood pressure med and all other meds since the past two weeks . Initially pt stated that she went to the pharmacy and they told her that she did not have any refills.  Later she stated that she thought that  the pharmacy would send her message for refills of her medication but she did not get any message from them.  Patient denies chest pain, dizziness, syncope.  Medications sent to [facility] today patient encouraged to keep all her medications reordered from the pharmacy    ROS Denies recent fever or chills. Denies sinus pressure, nasal congestion, ear pain or sore throat. Denies chest congestion, productive cough or wheezing. Denies chest pains, palpitations and leg swelling Denies abdominal pain, nausea, vomiting,diarrhea or constipation.   Denies dysuria, frequency, hesitancy or incontinence. Denies joint pain, swelling and limitation in mobility. Denies headaches, seizures, numbness, or tingling. Denies depression, anxiety or insomnia.    PE  BP (!) 195/112   Pulse 77   Ht 5' (1.524 m)   Wt 267 lb (121.1 kg)   SpO2 93%   BMI 52.14 kg/m   Patient alert and oriented and in no cardiopulmonary distress.  Chest: Clear to auscultation bilaterally.  CVS: S1, S2 no murmurs, no S3.Regular rate.  ABD: Soft non tender.   Ext: No edema  MS: Adequate ROM spine, shoulders, hips and knees.  Psych: Good eye contact, normal affect. Memory intact not anxious or depressed appearing.  CNS: CN 2-12 intact, power,  normal throughout.no focal deficits noted.   Assessment & Plan  Hypertension BP Readings from Last 3 Encounters:  03/09/22 (!) 195/112  01/26/22 (!) 169/99  12/29/21 (!) 154/103    Chronic condition uncontrolled Patient stated that she has been out of her medication since the past 2 weeks Amlodipine 10 mg daily, lisinopril- hydrochlorothiazide 20-12.5 mg, 2 tablets daily refilled.  Start labetalol 100 mg twice daily Need to take all medications as prescribed discussed with patient. Follow-up in 2 weeks for blood pressure recheck  Depression, recurrent (HCC) Lexapro 10 mg daily refilled Denies SI, HI  Non-adherence to medical treatment Patient has been out of her medications for the past 2 weeks I called the patient's daughter on the phone to discuss the patient's nonadherence to medical treatment.  I encouraged patient's daughter to call the patient daily to remind her to take her medications.  Patient stated that she will not answer her daughters phone call for the next 2 weeks.  patient encouraged to call the clinical pharmacist and maintain close follow-up with her to assist with medication adherence. Medication ordered in bubble packs to assist with medication adherence. Need to take all medications as ordered described with the patient.  Patient is being followed by CCM  Hyperlipidemia Atorvastatin 10 mg daily refilled Lab Results  Component Value Date   CHOL 212 (H) 10/23/2021   HDL 73 10/23/2021   LDLCALC 118 (H) 10/23/2021   TRIG 119 10/23/2021   CHOLHDL 2.9 10/23/2021    Diabetes mellitus Lab Results  Component Value Date   HGBA1C 6.4 (H) 10/23/2021  She has been out of metformin 1000 mg Metformin 1000 mg daily refilled Avoid sugar sweets soda  We will check A1c at next visit

## 2022-03-10 ENCOUNTER — Encounter: Payer: Self-pay | Admitting: *Deleted

## 2022-03-10 ENCOUNTER — Other Ambulatory Visit: Payer: Self-pay

## 2022-03-10 ENCOUNTER — Telehealth: Payer: Self-pay | Admitting: Nurse Practitioner

## 2022-03-10 DIAGNOSIS — I10 Essential (primary) hypertension: Secondary | ICD-10-CM

## 2022-03-10 DIAGNOSIS — F339 Major depressive disorder, recurrent, unspecified: Secondary | ICD-10-CM

## 2022-03-10 MED ORDER — LABETALOL HCL 100 MG PO TABS: 100.0000 mg | ORAL_TABLET | Freq: Two times a day (BID) | ORAL | 3 refills | Status: DC

## 2022-03-10 MED ORDER — ATORVASTATIN CALCIUM 10 MG PO TABS: 10.0000 mg | ORAL_TABLET | Freq: Every day | ORAL | 0 refills | Status: DC

## 2022-03-10 MED ORDER — AMLODIPINE BESYLATE 10 MG PO TABS: 10.0000 mg | ORAL_TABLET | Freq: Every day | ORAL | 1 refills | Status: DC

## 2022-03-10 MED ORDER — METFORMIN HCL ER 500 MG PO TB24: 1000.0000 mg | ORAL_TABLET | Freq: Every day | ORAL | 1 refills | Status: DC

## 2022-03-10 MED ORDER — LISINOPRIL-HYDROCHLOROTHIAZIDE 20-12.5 MG PO TABS: 2.0000 | ORAL_TABLET | Freq: Every day | ORAL | 1 refills | Status: DC

## 2022-03-10 MED ORDER — VITAMIN D3 25 MCG (1000 UT) PO CAPS: 1000.0000 [IU] | ORAL_CAPSULE | Freq: Every day | ORAL | 3 refills | Status: DC

## 2022-03-10 MED ORDER — OXYBUTYNIN CHLORIDE ER 10 MG PO TB24: 10.0000 mg | ORAL_TABLET | Freq: Every day | ORAL | 12 refills | Status: AC

## 2022-03-10 MED ORDER — ESCITALOPRAM OXALATE 10 MG PO TABS: 10.0000 mg | ORAL_TABLET | Freq: Every day | ORAL | 0 refills | Status: DC

## 2022-03-10 NOTE — Telephone Encounter (Signed)
Resent medications to [facility] side

## 2022-03-10 NOTE — Telephone Encounter (Signed)
Pt came by office stating [facility] told her they have not received her meds that were sent in yesterday. It looks like the meds went to the compounding part not the regular phar part. She is wanting to know if you can please resend?

## 2022-03-11 NOTE — Telephone Encounter (Signed)
Patient stop by our office said that [facility] has not yet received none of her medication.  Please resend all medicines that were sent in to [facility] yesterday.

## 2022-03-11 NOTE — Telephone Encounter (Signed)
Spoke with pharmacy they can not fill until the end of august when she is due for refills but they will do so, called pt to let her know no answer left vm

## 2022-03-12 NOTE — Telephone Encounter (Signed)
Called pt no answer left vm 

## 2022-03-16 NOTE — Telephone Encounter (Signed)
Called pt no answer left vm 

## 2022-03-17 NOTE — Telephone Encounter (Signed)
Called pt no answer left vm 

## 2022-03-23 ENCOUNTER — Telehealth: Payer: 59

## 2022-03-23 ENCOUNTER — Telehealth: Payer: Self-pay | Admitting: Nurse Practitioner

## 2022-03-23 ENCOUNTER — Encounter: Payer: Self-pay | Admitting: Nurse Practitioner

## 2022-03-23 ENCOUNTER — Ambulatory Visit (INDEPENDENT_AMBULATORY_CARE_PROVIDER_SITE_OTHER): Payer: 59 | Admitting: Nurse Practitioner

## 2022-03-23 VITALS — BP 182/102 | HR 72 | Ht 60.0 in | Wt 267.0 lb

## 2022-03-23 DIAGNOSIS — I10 Essential (primary) hypertension: Secondary | ICD-10-CM | POA: Diagnosis not present

## 2022-03-23 DIAGNOSIS — F339 Major depressive disorder, recurrent, unspecified: Secondary | ICD-10-CM | POA: Diagnosis not present

## 2022-03-23 DIAGNOSIS — Z91199 Patient's noncompliance with other medical treatment and regimen due to unspecified reason: Secondary | ICD-10-CM | POA: Diagnosis not present

## 2022-03-23 NOTE — Progress Notes (Addendum)
   Gwendolyn Woodward     MRN: [id number]      DOB: 09-Jun-1966   HPI Gwendolyn Woodward with past medical history of hypertension, hyperlipidemia, type 2 diabetes, depression is here for follow up for hypertension.  Patient stated that the pharmacy did not honor the request for her meds to be dispensed in bubble packs.  She does not feel like taking her medications.  She is currently taking only labetalol 100 mg twice daily,metformin 1000 mg daily. She denies chest pain, dizziness, syncope.    ROS Denies recent fever or chills. Denies sinus pressure, nasal congestion, ear pain or sore throat. Denies chest congestion, productive cough or wheezing. Denies chest pains, palpitations and leg swelling Denies abdominal pain, nausea, vomiting,diarrhea or constipation.   Denies dysuria, frequency, hesitancy or incontinence. Denies joint pain, swelling and limitation in mobility. Denies  seizures, numbness, or tingling. Denies skin break down or rash.   PE  BP (!) 182/102   Pulse 72   Ht 5' (1.524 m)   Wt 267 lb (121.1 kg)   SpO2 93%   BMI 52.14 kg/m   Patient alert and oriented and in no cardiopulmonary distress.  Chest: Clear to auscultation bilaterally.  CVS: S1, S2 no murmurs, no S3.Regular rate.  ABD: Soft non tender.   Ext: No edema  MS: Adequate ROM spine, shoulders, hips and knees.  Skin: Intact, no ulcerations or rash noted.  Psych: Good eye contact, normal affect. Memory intact not anxious or depressed appearing.  CNS: CN 2-12 intact, power,  normal throughout.no focal deficits noted.   Assessment & Plan  Hypertension BP Readings from Last 3 Encounters:  03/23/22 (!) 182/102  03/09/22 (!) 195/112  01/26/22 (!) 169/99  Chronic condition uncontrolled Patient has not been taking her medications as ordered. Request for medications to be dispensed in bubble packs was not fulfilled by her pharmacy because she still has enough of her medications at home. The need to take  medications daily as ordered discussed with patient.  Patient encouraged to get all pillbox and come weekly to the office so the nurse can help organize her medications to ensure medication adherence.  She verbalized understanding Patient will follow-up in the office tomorrow with the nurse in once weekly.    Non-adherence to medical treatment Patient will follow up with a nurse tomorrow Patient encouraged to get a pillbox and bring all her medications to the office tomorrow.  Need to take medications as ordered discussed with patient. Message sent to CCM to see how they can assist with making sure that the patient takes her meds as ordered. Pt confirmed missing several calls from the clinic  but does not know which number to call back.     Depression, recurrent ([place]) Chronic uncontrolled condition Patient has not been taking her medication She denies SI, HI Patient will follow-up in the office tomorrow so we can help organ ise her medications.  Patient encouraged to follow-up with the social worker for counseling.   non compliance with meds dicussed with the social  clinical Biomedical scientist, he promised to call the pt to schedule an appointment for follow up

## 2022-03-23 NOTE — Assessment & Plan Note (Addendum)
BP Readings from Last 3 Encounters:  03/23/22 (!) 182/102  03/09/22 (!) 195/112  01/26/22 (!) 169/99  Chronic condition uncontrolled Patient has not been taking her medications as ordered. Request for medications to be dispensed in bubble packs was not fulfilled by her pharmacy because she still has enough of her medications at home. The need to take medications daily as ordered discussed with patient.  Patient encouraged to get all pillbox and come weekly to the office so the nurse can help organize her medications to ensure medication adherence.  She verbalized understanding Patient will follow-up in the office tomorrow with the nurse in once weekly.

## 2022-03-23 NOTE — Assessment & Plan Note (Addendum)
Chronic uncontrolled condition Patient has not been taking her medication She denies SI, HI Patient will follow-up in the office tomorrow so we can help organ ise her medications.  Patient encouraged to follow-up with the social worker for counseling.   non compliance with meds dicussed with the social  clinical Biomedical scientist, he promised to call the pt to schedule an appointment for follow up

## 2022-03-23 NOTE — Assessment & Plan Note (Addendum)
Patient will follow up with a nurse tomorrow Patient encouraged to get a pillbox and bring all her medications to the office tomorrow.  Need to take medications as ordered discussed with patient. Message sent to CCM to see how they can assist with making sure that the patient takes her meds as ordered. Pt confirmed missing several calls from the clinic  but does not know which number to call back.

## 2022-03-23 NOTE — Patient Instructions (Addendum)
Please get a pill box at your pharmacy , bring all your medication to the office tomorrow, we will help you organize your pills as dicussed . It is very important that you take your medications as dicussed to help prevent stroke, heart attack , kidney damage.    Nurse please schedule a nurse visit with the patient for tomorrow.    It is important that you exercise regularly at least 30 minutes 5 times a week.  Think about what you will eat, plan ahead. Choose " clean, green, fresh or frozen" over canned, processed or packaged foods which are more sugary, salty and fatty. 70 to 75% of food eaten should be vegetables and fruit. Three meals at set times with snacks allowed between meals, but they must be fruit or vegetables. Aim to eat over a 12 hour period , example 7 am to 7 pm, and STOP after  your last meal of the day. Drink water,generally about 64 ounces per day, no other drink is as healthy. Fruit juice is best enjoyed in a healthy way, by EATING the fruit.  Thanks for choosing [facility], we consider it a privelige to serve you.

## 2022-03-24 ENCOUNTER — Ambulatory Visit (INDEPENDENT_AMBULATORY_CARE_PROVIDER_SITE_OTHER): Payer: 59

## 2022-03-24 ENCOUNTER — Telehealth: Payer: Self-pay | Admitting: Nurse Practitioner

## 2022-03-24 ENCOUNTER — Telehealth: Payer: Self-pay | Admitting: *Deleted

## 2022-03-24 DIAGNOSIS — Z91199 Patient's noncompliance with other medical treatment and regimen due to unspecified reason: Secondary | ICD-10-CM

## 2022-03-24 NOTE — Progress Notes (Signed)
Filled pill box for pt per provider

## 2022-03-24 NOTE — Chronic Care Management (AMB) (Signed)
  Chronic Care Management   Note  03/24/2022 Name: Gwendolyn Woodward MRN: [id number] DOB: [birth year]  Gwendolyn Woodward is a 56 y.o. year old female who is a primary care patient of Renee Rival, FNP. I reached out to Gwendolyn Woodward by phone today in response to a referral sent by [name] PCP.  [name] was given information about Chronic Care Management services today including:  CCM service includes personalized support from designated clinical staff supervised by her physician, including individualized plan of care and coordination with other care providers 24/7 contact phone numbers for assistance for urgent and routine care needs. Service will only be billed when office clinical staff spend 20 minutes or more in a month to coordinate care. Only one practitioner may furnish and bill the service in a calendar month. The patient may stop CCM services at any time (effective at the end of the month) by phone call to the office staff. The patient is responsible for co-pay (up to 20% after annual deductible is met) if co-pay is required by the individual health plan.   Patient agreed to services and verbal consent obtained.   Follow up plan: Telephone appointment with care management team member scheduled for:03/25/22  [place]  Direct Dial: [phone]

## 2022-03-24 NOTE — Telephone Encounter (Signed)
CMA assisted with putting meds in pills box, patient will return to the office once weekly for this until the end of the month.

## 2022-03-24 NOTE — Progress Notes (Signed)
Scheduled

## 2022-03-25 ENCOUNTER — Telehealth: Payer: Self-pay | Admitting: *Deleted

## 2022-03-25 ENCOUNTER — Telehealth: Payer: 59

## 2022-03-25 NOTE — Telephone Encounter (Signed)
  Care Management   Follow Up Note   03/25/2022 Name: Gwendolyn Woodward MRN: [id number] DOB: 1966-07-05   Referred by: Renee Rival, FNP Reason for referral : Chronic Care Management (DM2, HTN)   An unsuccessful telephone outreach was attempted today. The patient was referred to the case management team for assistance with care management and care coordination.   Follow Up Plan: Telephone follow up appointment with care management team member scheduled for:  upon care guide rescheduling.  Jacqlyn Larsen [facility], BSN RN Case Manager [facility] [phone]

## 2022-04-06 ENCOUNTER — Ambulatory Visit (INDEPENDENT_AMBULATORY_CARE_PROVIDER_SITE_OTHER): Payer: 59 | Admitting: Nurse Practitioner

## 2022-04-06 ENCOUNTER — Encounter: Payer: Self-pay | Admitting: Nurse Practitioner

## 2022-04-06 VITALS — BP 161/99 | HR 88 | Ht 61.0 in | Wt 266.0 lb

## 2022-04-06 DIAGNOSIS — I10 Essential (primary) hypertension: Secondary | ICD-10-CM | POA: Diagnosis not present

## 2022-04-06 DIAGNOSIS — E782 Mixed hyperlipidemia: Secondary | ICD-10-CM

## 2022-04-06 DIAGNOSIS — F331 Major depressive disorder, recurrent, moderate: Secondary | ICD-10-CM

## 2022-04-06 NOTE — Progress Notes (Signed)
   Gwendolyn Woodward     MRN: [id number]      DOB: 10/12/1965   HPI [name] with past medical history of hypertension, type 2 diabetes, hyperlipidemia, depression, obesity is here for follow up for hypertension. Her daughter came last week to help organise her pills box. She has not taken her medications today .  No complaints of headache, dizziness, chest pain.      ROS Denies recent fever or chills. Denies sinus pressure, nasal congestion, ear pain or sore throat. Denies chest congestion, productive cough or wheezing. Denies chest pains, palpitations and leg swelling Denies abdominal pain, nausea, vomiting,diarrhea or constipation.   Denies dysuria, frequency, hesitancy or incontinence. Denies joint pain, swelling and limitation in mobility. Denies headaches, seizures, numbness, or tingling.    PE  BP (!) 161/99   Pulse 88   Ht $R'5\' 1"'tz$  (1.549 m)   Wt 266 lb (120.7 kg)   SpO2 93%   BMI 50.26 kg/m   Patient alert and oriented and in no cardiopulmonary distress.   Chest: Clear to auscultation bilaterally.  CVS: S1, S2 no murmurs, no S3.Regular rate.  ABD: Soft non tender.   Ext: No edema  MS: Adequate ROM spine, shoulders, hips and knees.  Skin: Intact, no ulcerations or rash noted.  Psych: Good eye contact, normal affect. Memory intact not anxious or depressed appearing.  CNS: CN 2-12 intact, power,  normal throughout.no focal deficits noted.   Assessment & Plan  Hypertension BP Readings from Last 3 Encounters:  04/06/22 (!) 161/99  03/23/22 (!) 182/102  03/09/22 (!) 195/112  Chronic uncontrolled condition CMA assisted patient with organizing her medications in the pillbox today, she will be coming to the office once weekly to have this done. States that she has been taking her blood pressure medication daily as ordered but she has not taken the medications today.  Currently on amlodipine 10 mg daily, labetalol 100 mg twice daily,  lisinopril-hydrochlorothiazide 20- 12.5 mg 2 tablets daily Continue current medication DASH diet advised Follow-up in 4 weeks Lab Results  Component Value Date   NA 143 10/23/2021   K 4.1 10/23/2021   CO2 21 10/23/2021   GLUCOSE 100 (H) 10/23/2021   BUN 24 10/23/2021   CREATININE 1.05 (H) 10/23/2021   CALCIUM 10.4 (H) 10/23/2021   EGFR 62 10/23/2021   GFRNONAA >60 04/25/2020    Hyperlipidemia Patient encouraged to pick up atorvastatin at the pharmacy and take 10 mg daily.  Labs ordered previously patient states that she has had labs done but no results seen on epic will follow-up with the labs on this Lab Results  Component Value Date   CHOL 212 (H) 10/23/2021   HDL 73 10/23/2021   LDLCALC 118 (H) 10/23/2021   TRIG 119 10/23/2021   CHOLHDL 2.9 10/23/2021    Depression She has been missing her appointments with CCM was referred for counseling and for  referral to psych. She denies SI, HI Currently on Lexapro 10 mg daily Patient encouraged to maintain close follow-up with CCM, phone numbers to reach CCM given today

## 2022-04-06 NOTE — Assessment & Plan Note (Signed)
She has been missing her appointments with CCM was referred for counseling and for  referral to psych. She denies SI, HI Currently on Lexapro 10 mg daily Patient encouraged to maintain close follow-up with CCM, phone numbers to reach CCM given today

## 2022-04-06 NOTE — Assessment & Plan Note (Signed)
Patient encouraged to pick up atorvastatin at the pharmacy and take 10 mg daily.  Labs ordered previously patient states that she has had labs done but no results seen on epic will follow-up with the labs on this Lab Results  Component Value Date   CHOL 212 (H) 10/23/2021   HDL 73 10/23/2021   LDLCALC 118 (H) 10/23/2021   TRIG 119 10/23/2021   CHOLHDL 2.9 10/23/2021

## 2022-04-06 NOTE — Patient Instructions (Addendum)
[phone]   [phone]  please call this number , the nurse has been trying to reach you about scheduling your appointment to see a Counsellor and to help make sure that you are taking your medication .   Please consider getting your shingles vaccine at the pharmacy   Please come to the office to get your pills organized every week as dicussed   Please pick up your atorvastatin from the pharmacy , take one tablet daily     It is important that you exercise regularly at least 30 minutes 5 times a week.  Think about what you will eat, plan ahead. Choose " clean, green, fresh or frozen" over canned, processed or packaged foods which are more sugary, salty and fatty. 70 to 75% of food eaten should be vegetables and fruit. Three meals at set times with snacks allowed between meals, but they must be fruit or vegetables. Aim to eat over a 12 hour period , example 7 am to 7 pm, and STOP after  your last meal of the day. Drink water,generally about 64 ounces per day, no other drink is as healthy. Fruit juice is best enjoyed in a healthy way, by EATING the fruit.  Thanks for choosing [facility], we consider it a privelige to serve you.

## 2022-04-06 NOTE — Assessment & Plan Note (Signed)
BP Readings from Last 3 Encounters:  04/06/22 (!) 161/99  03/23/22 (!) 182/102  03/09/22 (!) 195/112  Chronic uncontrolled condition CMA assisted patient with organizing her medications in the pillbox today, she will be coming to the office once weekly to have this done. States that she has been taking her blood pressure medication daily as ordered but she has not taken the medications today.  Currently on amlodipine 10 mg daily, labetalol 100 mg twice daily, lisinopril-hydrochlorothiazide 20- 12.5 mg 2 tablets daily Continue current medication DASH diet advised Follow-up in 4 weeks Lab Results  Component Value Date   NA 143 10/23/2021   K 4.1 10/23/2021   CO2 21 10/23/2021   GLUCOSE 100 (H) 10/23/2021   BUN 24 10/23/2021   CREATININE 1.05 (H) 10/23/2021   CALCIUM 10.4 (H) 10/23/2021   EGFR 62 10/23/2021   GFRNONAA >60 04/25/2020   

## 2022-04-08 LAB — HM DIABETES EYE EXAM

## 2022-04-12 ENCOUNTER — Ambulatory Visit (INDEPENDENT_AMBULATORY_CARE_PROVIDER_SITE_OTHER): Payer: 59 | Admitting: *Deleted

## 2022-04-12 ENCOUNTER — Other Ambulatory Visit: Payer: Self-pay | Admitting: Pharmacist

## 2022-04-12 ENCOUNTER — Encounter: Payer: Self-pay | Admitting: *Deleted

## 2022-04-12 DIAGNOSIS — I10 Essential (primary) hypertension: Secondary | ICD-10-CM

## 2022-04-12 DIAGNOSIS — E1169 Type 2 diabetes mellitus with other specified complication: Secondary | ICD-10-CM

## 2022-04-12 NOTE — Patient Instructions (Signed)
Visit Information  Thank you for taking time to visit with me today. Please don't hesitate to contact me if I can be of assistance to you before our next scheduled telephone appointment.  Following are the goals we discussed today:  Continue checking blood sugar twice daily and recording Follow carbohydrate modified diet, too much bread, pasta, rice, potatoes will raise blood sugar Continue checking blood pressure daily and recording Continue working with Education officer, museum Go to your appointment for counseling Take all medications as prescribed Expect a call from pharmacist about getting your medication into prefilled pill packs Advanced directives packet mailed- please look over and complete Please look over education mailed- hypoglycemia and low sodium diet  Our next appointment is by telephone on 04/27/22 at 1 pm  Please call the care guide team at [phone] if you need to cancel or reschedule your appointment.   If you are experiencing a Mental Health or [place] or need someone to talk to, please call the Suicide and Crisis Lifeline: 988 call the [place] National Suicide Prevention Lifeline: 813-314-9426 or TTY: 540-205-9077 TTY (760)690-6118) to talk to a trained counselor call 1-800-273-TALK (toll free, 24 hour hotline) go to [facility] [address] [place] [phone]) call the [place]: [phone] call 911   The patient verbalized understanding of instructions, educational materials, and care plan provided today and agreed to receive a mailed copy of patient instructions, educational materials, and care plan.   Jacqlyn Larsen [facility], BSN RN Case Manager [facility] [phone]   Low-Sodium Eating Plan Sodium, which is an element that makes up salt, helps you maintain a healthy balance of fluids in your body. Too much sodium can increase your blood pressure and cause fluid and waste to  be held in your body. Your health care provider or dietitian may recommend following this plan if you have high blood pressure (hypertension), kidney disease, liver disease, or heart failure. Eating less sodium can help lower your blood pressure, reduce swelling, and protect your heart, liver, and kidneys. What are tips for following this plan? Reading food labels The Nutrition Facts label lists the amount of sodium in one serving of the food. If you eat more than one serving, you must multiply the listed amount of sodium by the number of servings. Choose foods with less than 140 mg of sodium per serving. Avoid foods with 300 mg of sodium or more per serving. Shopping  Look for lower-sodium products, often labeled as "low-sodium" or "no salt added." Always check the sodium content, even if foods are labeled as "unsalted" or "no salt added." Buy fresh foods. Avoid canned foods and pre-made or frozen meals. Avoid canned, cured, or processed meats. Buy breads that have less than 80 mg of sodium per slice. Cooking  Eat more home-cooked food and less restaurant, buffet, and fast food. Avoid adding salt when cooking. Use salt-free seasonings or herbs instead of table salt or sea salt. Check with your health care provider or pharmacist before using salt substitutes. Cook with plant-based oils, such as canola, sunflower, or olive oil. Meal planning When eating at a restaurant, ask that your food be prepared with less salt or no salt, if possible. Avoid dishes labeled as brined, pickled, cured, smoked, or made with soy sauce, miso, or teriyaki sauce. Avoid foods that contain MSG (monosodium glutamate). MSG is sometimes added to [place] food, bouillon, and some canned foods. Make meals that can be grilled, baked, poached, roasted,  or steamed. These are generally made with less sodium. General information Most people on this plan should limit their sodium intake to 1,500-2,000 mg (milligrams) of sodium  each day. What foods should I eat? Fruits Fresh, frozen, or canned fruit. Fruit juice. Vegetables Fresh or frozen vegetables. "No salt added" canned vegetables. "No salt added" tomato sauce and paste. Low-sodium or reduced-sodium tomato and vegetable juice. Grains Low-sodium cereals, including oats, puffed wheat and rice, and shredded wheat. Low-sodium crackers. Unsalted rice. Unsalted pasta. Low-sodium bread. Whole-grain breads and whole-grain pasta. Meats and other proteins Fresh or frozen (no salt added) meat, poultry, seafood, and fish. Low-sodium canned tuna and salmon. Unsalted nuts. Dried peas, beans, and lentils without added salt. Unsalted canned beans. Eggs. Unsalted nut butters. Dairy Milk. Soy milk. Cheese that is naturally low in sodium, such as ricotta cheese, fresh mozzarella, or Swiss cheese. Low-sodium or reduced-sodium cheese. Cream cheese. Yogurt. Seasonings and condiments Fresh and dried herbs and spices. Salt-free seasonings. Low-sodium mustard and ketchup. Sodium-free salad dressing. Sodium-free light mayonnaise. Fresh or refrigerated horseradish. Lemon juice. Vinegar. Other foods Homemade, reduced-sodium, or low-sodium soups. Unsalted popcorn and pretzels. Low-salt or salt-free chips. The items listed above may not be a complete list of foods and beverages you can eat. Contact a dietitian for more information. What foods should I avoid? Vegetables Sauerkraut, pickled vegetables, and relishes. Olives. [place] fries. Onion rings. Regular canned vegetables (not low-sodium or reduced-sodium). Regular canned tomato sauce and paste (not low-sodium or reduced-sodium). Regular tomato and vegetable juice (not low-sodium or reduced-sodium). Frozen vegetables in sauces. Grains Instant hot cereals. Bread stuffing, pancake, and biscuit mixes. Croutons. Seasoned rice or pasta mixes. Noodle soup cups. Boxed or frozen macaroni and cheese. Regular salted crackers. Self-rising flour. Meats  and other proteins Meat or fish that is salted, canned, smoked, spiced, or pickled. Precooked or cured meat, such as sausages or meat loaves. Berniece Salines. Ham. Pepperoni. Hot dogs. Corned beef. Chipped beef. Salt pork. Jerky. Pickled herring. Anchovies and sardines. Regular canned tuna. Salted nuts. Dairy Processed cheese and cheese spreads. Hard cheeses. Cheese curds. Blue cheese. Feta cheese. String cheese. Regular cottage cheese. Buttermilk. Canned milk. Fats and oils Salted butter. Regular margarine. Ghee. Bacon fat. Seasonings and condiments Onion salt, garlic salt, seasoned salt, table salt, and sea salt. Canned and packaged gravies. Worcestershire sauce. Tartar sauce. Barbecue sauce. Teriyaki sauce. Soy sauce, including reduced-sodium. Steak sauce. Fish sauce. Oyster sauce. Cocktail sauce. Horseradish that you find on the shelf. Regular ketchup and mustard. Meat flavorings and tenderizers. Bouillon cubes. Hot sauce. Pre-made or packaged marinades. Pre-made or packaged taco seasonings. Relishes. Regular salad dressings. Salsa. Other foods Salted popcorn and pretzels. Corn chips and puffs. Potato and tortilla chips. Canned or dried soups. Pizza. Frozen entrees and pot pies. The items listed above may not be a complete list of foods and beverages you should avoid. Contact a dietitian for more information. Summary Eating less sodium can help lower your blood pressure, reduce swelling, and protect your heart, liver, and kidneys. Most people on this plan should limit their sodium intake to 1,500-2,000 mg (milligrams) of sodium each day. Canned, boxed, and frozen foods are high in sodium. Restaurant foods, fast foods, and pizza are also very high in sodium. You also get sodium by adding salt to food. Try to cook at home, eat more fresh fruits and vegetables, and eat less fast food and canned, processed, or prepared foods. This information is not intended to replace advice given to you by your health care  provider. Make sure you discuss any questions you have with your health care provider. Document Revised: 09/14/2019 Document Reviewed: 07/11/2019 Elsevier Patient Education  [place]. Hypoglycemia Hypoglycemia occurs when the level of sugar (glucose) in the blood is too low. Hypoglycemia can happen in people who have or do not have diabetes. It can develop quickly, and it can be a medical emergency. For most people, a blood glucose level below 70 mg/dL (3.9 mmol/L) is considered hypoglycemia. Glucose is a type of sugar that provides the body's main source of energy. Certain hormones (insulin and glucagon) control the level of glucose in the blood. Insulin lowers blood glucose, and glucagon raises blood glucose. Hypoglycemia can result from having too much insulin in the bloodstream, or from not eating enough food that contains glucose. You may also have reactive hypoglycemia, which happens within 4 hours after eating a meal. What are the causes? Hypoglycemia occurs most often in people who have diabetes and may be caused by: Diabetes medicine. Not eating enough, or not eating often enough. Increased physical activity. Drinking alcohol on an empty stomach. If you do not have diabetes, hypoglycemia may be caused by: A tumor in the pancreas. Not eating enough, or not eating for long periods at a time (fasting). A severe infection or illness. Problems after having bariatric surgery. Organ failure, such as kidney or liver failure. Certain medicines. What increases the risk? Hypoglycemia is more likely to develop in people who: Have diabetes and take medicines to lower blood glucose. Abuse alcohol. Have a severe illness. What are the signs or symptoms? Symptoms vary depending on whether the condition is mild, moderate, or severe. Mild hypoglycemia Hunger. Sweating and feeling clammy. Dizziness or feeling light-headed. Sleepiness or restless sleep. Nausea. Increased heart  rate. Headache. Blurry vision. Mood changes, such as irritability or anxiety. Tingling or numbness around the mouth, lips, or tongue. Moderate hypoglycemia Confusion and poor judgment. Behavior changes. Weakness. Irregular heartbeat. A change in coordination. Severe hypoglycemia Severe hypoglycemia is a medical emergency. It can cause: Fainting. Seizures. Loss of consciousness (coma). Death. How is this diagnosed? Hypoglycemia is diagnosed with a blood test to measure your blood glucose level. This blood test is done while you are having symptoms. Your health care provider may also do a physical exam and review your medical history. How is this treated? This condition can be treated by immediately eating or drinking something that contains sugar with 15 grams of fast-acting carbohydrate, such as: 4 oz (120 mL) of fruit juice. 4 oz (120 mL) of regular soda (not diet soda). Several pieces of hard candy. Check food labels to find out how many pieces to eat for 15 grams. 1 Tbsp (15 mL) of sugar or honey. 4 glucose tablets. 1 tube of glucose gel. Treating hypoglycemia if you have diabetes If you are alert and able to swallow safely, follow the 15:15 rule: Take 15 grams of a fast-acting carbohydrate. Talk with your health care provider about how much you should take. Options for getting 15 grams of fast-acting carbohydrate include: Glucose tablets (take 4 tablets). Several pieces of hard candy. Check food labels to find out how many pieces to eat for 15 grams. 4 oz (120 mL) of fruit juice. 4 oz (120 mL) of regular soda (not diet soda). 1 Tbsp (15 mL) of sugar or honey. 1 tube of glucose gel. Check your blood glucose 15 minutes after you take the carbohydrate. If the repeat blood glucose level is still at or below 70 mg/dL (  3.9 mmol/L), take 15 grams of a carbohydrate again. If your blood glucose level does not increase above 70 mg/dL (3.9 mmol/L) after 3 tries, seek emergency medical  care. After your blood glucose level returns to normal, eat a meal or a snack within 1 hour.  Treating severe hypoglycemia Severe hypoglycemia is when your blood glucose level is below 54 mg/dL (3 mmol/L). Severe hypoglycemia is a medical emergency. Get medical help right away. If you have severe hypoglycemia and you cannot eat or drink, you will need to be given glucagon. A family member or close friend should learn how to check your blood glucose and how to give you glucagon. Ask your health care provider if you need to have an emergency glucagon kit available. Severe hypoglycemia may need to be treated in a hospital. The treatment may include getting glucose through an IV. You may also need treatment for the cause of your hypoglycemia. Follow these instructions at home:  General instructions Take over-the-counter and prescription medicines only as told by your health care provider. Monitor your blood glucose as told by your health care provider. If you drink alcohol: Limit how much you have to: 0-1 drink a day for women who are not pregnant. 0-2 drinks a day for men. Know how much alcohol is in your drink. In the [place] one drink equals one 12 oz bottle of beer (355 mL), one 5 oz glass of wine (148 mL), or one 1 oz glass of hard liquor (44 mL). Be sure to eat food along with drinking alcohol. Be aware that alcohol is absorbed quickly and may have lingering effects that may result in hypoglycemia later. Be sure to do ongoing glucose monitoring. Keep all follow-up visits. This is important. If you have diabetes: Always have a fast-acting carbohydrate (15 grams) option with you to treat low blood glucose. Follow your diabetes management plan as directed by your health care provider. Make sure you: Know the symptoms of hypoglycemia. It is important to treat it right away to prevent it from becoming severe. Check your blood glucose as often as told. Always check before and after  exercise. Always check your blood glucose before you drive a motorized vehicle. Take your medicines as told. Follow your meal plan. Eat on time, and do not skip meals. Share your diabetes management plan with people in your workplace, school, and household. Carry a medical alert card or wear medical alert jewelry. Where to find more information American Diabetes Association: www.diabetes.org Contact a health care provider if: You have problems keeping your blood glucose in your target range. You have frequent episodes of hypoglycemia. Get help right away if: You continue to have hypoglycemia symptoms after eating or drinking something that contains 15 grams of fast-acting carbohydrate, and you cannot get your blood glucose above 70 mg/dL (3.9 mmol/L) while following the 15:15 rule. Your blood glucose is below 54 mg/dL (3 mmol/L). You have a seizure. You faint. These symptoms may represent a serious problem that is an emergency. Do not wait to see if the symptoms will go away. Get medical help right away. Call your local emergency services (911 in the [place] Do not drive yourself to the hospital. Summary Hypoglycemia occurs when the level of sugar (glucose) in the blood is too low. Hypoglycemia can happen in people who have or do not have diabetes. It can develop quickly, and it can be a medical emergency. Make sure you know the symptoms of hypoglycemia and how to treat it. Always have  a fast-acting carbohydrate option with you to treat low blood sugar. This information is not intended to replace advice given to you by your health care provider. Make sure you discuss any questions you have with your health care provider. Document Revised: 07/10/2020 Document Reviewed: 07/10/2020 Elsevier Patient Education  [place].

## 2022-04-12 NOTE — Chronic Care Management (AMB) (Signed)
Chief Complaint  Patient presents with   Diabetes   Hypertension    Gwendolyn Woodward is a 56 y.o. year old female who presented for a telephone visit.   They were referred to the pharmacist by  PCP  for assistance in managing hypertension.   Subjective:  Care Team: Primary Care Provider: Renee Rival, FNP ; Next Scheduled Visit: 05/11/22  Medication Access/Adherence  [facility]  [id number] [place] [address] [address]: [phone]: [address]Phone: [phone]: [phone] [place] [address] [address]Phone: [phone]: [phone]  Patient reports affordability concerns with their medications: No  Patient reports access/transportation concerns to their pharmacy: No  Patient reports adherence concerns with their medications:  Yes  reports she often forgets to take her medications in the evening. She has been taking her pill bottles to the clinic for pill box fills, but [place].     Hypertension:  Current medications: amlodipine 10 mg daily, lisinopril/HCTZ 20/12.5 mg two tablets daily, labetolol 100 mg twice daily   Patient has a validated, automated, upper arm home BP cuff, but has not checked at home lately  Hyperlipidemia/ASCVD Risk Reduction  Current lipid lowering medications: atorvastatin 10 mg daily   Diabetes:  Current medications: metformin XR 1000 mg daily  Depression/Anxiety: Current medications: escitalopram 10 mg daily  Overactive Bladder: Current medications: oxybutynin XL 10 mg daily  Health Maintenance  Health Maintenance Due  Topic Date Due   COVID-19 Vaccine (1) Never done   HIV  Screening  Never done   Hepatitis C Screening  Never done   Zoster Vaccines- Shingrix (1 of 2) Never done   COLONOSCOPY (Pts 45-429yrInsurance coverage will need to be confirmed)  01/26/2022   PAP SMEAR-Modifier  05/03/2022     Objective: Lab Results  Component Value Date   HGBA1C 6.4 (H) 10/23/2021    Lab Results  Component Value Date   CREATININE 1.05 (H) 10/23/2021   BUN 24 10/23/2021   NA 143 10/23/2021   K 4.1 10/23/2021   CL 98 10/23/2021   CO2 21 10/23/2021    Lab Results  Component Value Date   CHOL 212 (H) 10/23/2021   HDL 73 10/23/2021   LDLCALC 118 (H) 10/23/2021   TRIG 119 10/23/2021   CHOLHDL 2.9 10/23/2021    Medications Reviewed Today     Reviewed by HaOsker MasonRPH-CPP (Pharmacist) on 04/12/22 at 1401  Med List Status: <None>   Medication Order Taking? Sig Documenting Provider Last Dose Status Informant  ACCU-CHEK GUIDE test strip [id number]  [provider] Taking Active   Accu-Chek Softclix Lancets lancets [id number] Once daily testing dx e11.9 [place], FoDewaine CongerFNP Taking Active   acetaminophen (TYLENOL) 325 MG tablet [id number] Take 2 tablets (650 mg total) by mouth every 6 (six) hours as needed for mild pain or moderate pain.  Patient not taking: Reported on 03/23/2022   McCorinne PortsPA-C Not Taking Active   amLODipine (NORVASC) 10 MG tablet [id number] Take 1 tablet (10 mg total) by mouth daily. PaRenee RivalFNP Taking Active   atorvastatin (LIPITOR) 10 MG tablet [id number] Take 1 tablet (  10 mg total) by mouth at bedtime.  Patient not taking: Reported on 03/23/2022   Renee Rival, FNP Not Taking Active   Cholecalciferol (VITAMIN D3) 25 MCG (1000 UT) CAPS [id number] Yes Take 1 capsule (1,000 Units total) by mouth daily. Renee Rival, FNP Taking Active   Cyanocobalamin (VITAMIN B12 PO) [id number] Yes Take by mouth. Once daily [provider] Taking Active Self  escitalopram (LEXAPRO) 10  MG tablet [id number] Yes Take 1 tablet (10 mg total) by mouth daily. Renee Rival, FNP Taking Active   labetalol (NORMODYNE) 100 MG tablet [id number] Yes Take 1 tablet (100 mg total) by mouth 2 (two) times daily. Renee Rival, FNP Taking Active   lisinopril-hydrochlorothiazide (ZESTORETIC) 20-12.5 MG tablet [id number] Yes Take 2 tablets by mouth daily. Renee Rival, FNP Taking Active   metFORMIN (GLUCOPHAGE-XR) 500 MG 24 hr tablet [id number] Yes Take 2 tablets (1,000 mg total) by mouth daily. Renee Rival, FNP Taking Active   oxybutynin (DITROPAN XL) 10 MG 24 hr tablet [id number] Yes Take 1 tablet (10 mg total) by mouth daily. Renee Rival, FNP Taking Active   rizatriptan (MAXALT) 10 MG tablet [id number] No Take 10 mg by mouth as needed for migraine. TAKE 1 TABLET BY MOUTH ONCE, MAY REPEAT AT 2 HOUR INTERVALS; DO NO EXCEED 30 MG IN 24 HOURS  Patient not taking: Reported on 03/23/2022   [provider] Not Taking Active Self              Assessment/Plan:   Hypertension: - Currently uncontrolled - Reviewed long term cardiovascular and renal outcomes of uncontrolled blood pressure - Reviewed appropriate blood pressure monitoring technique and reviewed goal blood pressure. Recommended to check home blood pressure and heart rate daily - Recommend to discontinue labetolol and start spironolactone 25 mg daily so that all medications can be taken together in the morning. PCP is off this week and next week, will collaborate with covering provider for this suggestion.   [facility], [facility]Z. Contacted [employer]. They will start the process for adherence packaging.     Follow Up Plan: will continue to follow to aid in adherence.   Catie Hedwig Morton, PharmD, [facility] [phone]
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
## 2022-04-13 ENCOUNTER — Telehealth: Payer: 59

## 2022-04-13 ENCOUNTER — Telehealth: Payer: Self-pay | Admitting: *Deleted

## 2022-04-13 NOTE — Chronic Care Management (AMB) (Unsigned)
  Chronic Care Management Note  04/13/2022 Name: Gwendolyn Woodward MRN: [id number] DOB: February 09, 1966  Gwendolyn Woodward is a 56 y.o. year old female who is a primary care patient of Renee Rival, FNP and is actively engaged with the care management team. I reached out to Tereasa Coop by phone today to assist with re-scheduling a follow up visit with the Licensed Clinical Social Worker  Follow up plan: Unsuccessful telephone outreach attempt made. A HIPAA compliant phone message was left for the patient providing contact information and requesting a return call.  The care management team will reach out to the patient again over the next 1 days.  If patient returns call to provider office, please advise to call [place] at [phone].  [place]  Direct Dial: [phone]

## 2022-04-15 NOTE — Chronic Care Management (AMB) (Signed)
  Chronic Care Management Note  04/15/2022 Name: KHAMORA KARAN MRN: [id number] DOB: [birth year]  ZIAN MOHAMED is a 56 y.o. year old female who is a primary care patient of Renee Rival, FNP and is actively engaged with the care management team. I reached out to Tereasa Coop by phone today to assist with re-scheduling a follow up visit with the Licensed Clinical Social Worker  Follow up plan: Telephone appointment with care management team member scheduled for:04/16/22  [place]  Direct Dial: [phone]

## 2022-04-16 ENCOUNTER — Ambulatory Visit: Payer: 59

## 2022-04-16 DIAGNOSIS — F331 Major depressive disorder, recurrent, moderate: Secondary | ICD-10-CM

## 2022-04-16 DIAGNOSIS — I10 Essential (primary) hypertension: Secondary | ICD-10-CM

## 2022-04-16 DIAGNOSIS — F39 Unspecified mood [affective] disorder: Secondary | ICD-10-CM

## 2022-04-16 NOTE — Patient Instructions (Addendum)
Visit Information  Patient goals: Manage depression issues  Time Frame: Short Term Goal Priority:  High Progress:  On Track  Start Date:  10/27/21 End Date:  05/17/22     Follow Up Date:  LCSW is discharging client today from CCM SW services   Depressive Symptoms Identified. Manage depression issues  Patient Coping Skills: Goes to [facility] to exercise Attends medical appointments  Patient Deficits Depression Weight challenges  Patient Goals: Attend medical appointments in next 30 days Take medications as prescribed Try to have one or two social interactions weekly to build friendships with others  Follow up Plan:  LCSW  is discharging client today from [place]. Client agreed to this plan .  Norva Riffle.Lofton Leon MSW, [place] Holiday representative [facility] Care Management [phone]
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
## 2022-04-22 ENCOUNTER — Ambulatory Visit: Payer: 59 | Admitting: Adult Health

## 2022-04-22 ENCOUNTER — Encounter: Payer: Self-pay | Admitting: Nurse Practitioner

## 2022-04-22 ENCOUNTER — Other Ambulatory Visit: Payer: 59 | Admitting: Pharmacist

## 2022-04-22 DIAGNOSIS — E1169 Type 2 diabetes mellitus with other specified complication: Secondary | ICD-10-CM

## 2022-04-22 DIAGNOSIS — F32A Depression, unspecified: Secondary | ICD-10-CM

## 2022-04-22 DIAGNOSIS — I10 Essential (primary) hypertension: Secondary | ICD-10-CM | POA: Diagnosis not present

## 2022-04-22 NOTE — Progress Notes (Signed)
Care Coordination Call  Contacted patient. She has not received adherence packaging from [place] yet.   Contacted [employer]. They noted they had called her several times and were unable to get in touch. They need patient to call them to set up payment and decision about pick up vs delivery.   Called patient back. Provided phone number for [facility], instructed her to call right now and speak with Joellen Jersey to discuss bubble packs. Patient verbalized understanding.   Will check back in with patient next week.   Catie Hedwig Morton, PharmD, [facility] [phone]

## 2022-04-23 ENCOUNTER — Other Ambulatory Visit: Payer: 59 | Admitting: Pharmacist

## 2022-04-27 ENCOUNTER — Ambulatory Visit (INDEPENDENT_AMBULATORY_CARE_PROVIDER_SITE_OTHER): Payer: 59 | Admitting: *Deleted

## 2022-04-27 DIAGNOSIS — I10 Essential (primary) hypertension: Secondary | ICD-10-CM

## 2022-04-27 DIAGNOSIS — E119 Type 2 diabetes mellitus without complications: Secondary | ICD-10-CM

## 2022-04-27 NOTE — Patient Instructions (Signed)
Visit Information  Thank you for taking time to visit with me today. Please don't hesitate to contact me if I can be of assistance to you before our next scheduled telephone appointment.  Following are the goals we discussed today: Continue checking blood sugar twice daily and recording Follow carbohydrate modified diet, too much bread, pasta, rice, potatoes will raise blood sugar Continue checking blood pressure daily and recording Please call and follow up about your counseling appointment Take all medications as prescribed Continue working with pharmacist about getting your medication into prefilled pill packs Advanced directives packet mailed- please look over and complete Follow low sodium diet- read food labels for sodium content, get someone to help you if needed Continue trying to walk daily- keep up the good work!  Our next appointment is by telephone on 06/08/22 [id number] am  Please call the care guide team at [phone] if you need to cancel or reschedule your appointment.   If you are experiencing a Mental Health or [place] or need someone to talk to, please call the Suicide and Crisis Lifeline: 988 call the [place] National Suicide Prevention Lifeline: (910) 812-0895 or TTY: 651-760-8396 TTY 314-866-3344) to talk to a trained counselor call 1-800-273-TALK (toll free, 24 hour hotline) go to [facility] [address], [place] [phone]) call the [place]: [phone] call 911   The patient verbalized understanding of instructions, educational materials, and care plan provided today and DECLINED offer to receive copy of patient instructions, educational materials, and care plan.   Gwendolyn Woodward [facility], BSN RN Case Manager [facility] [phone]
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
## 2022-04-28 ENCOUNTER — Telehealth: Payer: Self-pay

## 2022-04-28 NOTE — Telephone Encounter (Signed)
error 

## 2022-04-28 NOTE — Progress Notes (Signed)
Please advise 

## 2022-04-29 ENCOUNTER — Other Ambulatory Visit: Payer: 59 | Admitting: Pharmacist

## 2022-04-29 NOTE — Progress Notes (Signed)
Care Coordination Call  Called patient to follow up on going by [place] to pick up bubble packs. She notes she is going to do so today. I asked her to call me when she has done this.   I will call her in 2 weeks to follow up.   Catie Hedwig Morton, PharmD, [facility] [phone]

## 2022-05-01 ENCOUNTER — Other Ambulatory Visit: Payer: Self-pay | Admitting: Nurse Practitioner

## 2022-05-01 DIAGNOSIS — F339 Major depressive disorder, recurrent, unspecified: Secondary | ICD-10-CM

## 2022-05-04 ENCOUNTER — Other Ambulatory Visit: Payer: Self-pay | Admitting: Nurse Practitioner

## 2022-05-04 ENCOUNTER — Telehealth: Payer: Self-pay | Admitting: Nurse Practitioner

## 2022-05-04 DIAGNOSIS — F331 Major depressive disorder, recurrent, moderate: Secondary | ICD-10-CM

## 2022-05-04 NOTE — Telephone Encounter (Signed)
Mavis w. [facility] called in on patient behalf  Did depression questionnaire and patient has  been having suicidal thoughts lately.  Asking  for psych referral   Call back info [phone]

## 2022-05-04 NOTE — Telephone Encounter (Signed)
Spoke with patient and advised if actively suicidal to call 911 and she understood,. Referred to psych urgently

## 2022-05-04 NOTE — Telephone Encounter (Signed)
Please advise 

## 2022-05-05 ENCOUNTER — Ambulatory Visit (INDEPENDENT_AMBULATORY_CARE_PROVIDER_SITE_OTHER): Payer: 59 | Admitting: Nurse Practitioner

## 2022-05-05 ENCOUNTER — Encounter: Payer: Self-pay | Admitting: Nurse Practitioner

## 2022-05-05 VITALS — BP 165/96 | HR 70 | Resp 16 | Ht 61.0 in | Wt 266.1 lb

## 2022-05-05 DIAGNOSIS — R5383 Other fatigue: Secondary | ICD-10-CM | POA: Insufficient documentation

## 2022-05-05 DIAGNOSIS — F339 Major depressive disorder, recurrent, unspecified: Secondary | ICD-10-CM

## 2022-05-05 DIAGNOSIS — E119 Type 2 diabetes mellitus without complications: Secondary | ICD-10-CM | POA: Diagnosis not present

## 2022-05-05 DIAGNOSIS — E559 Vitamin D deficiency, unspecified: Secondary | ICD-10-CM | POA: Diagnosis not present

## 2022-05-05 DIAGNOSIS — S90862A Insect bite (nonvenomous), left foot, initial encounter: Secondary | ICD-10-CM | POA: Diagnosis not present

## 2022-05-05 DIAGNOSIS — G8929 Other chronic pain: Secondary | ICD-10-CM

## 2022-05-05 DIAGNOSIS — W57XXXA Bitten or stung by nonvenomous insect and other nonvenomous arthropods, initial encounter: Secondary | ICD-10-CM | POA: Insufficient documentation

## 2022-05-05 DIAGNOSIS — I10 Essential (primary) hypertension: Secondary | ICD-10-CM

## 2022-05-05 DIAGNOSIS — M25511 Pain in right shoulder: Secondary | ICD-10-CM

## 2022-05-05 MED ORDER — GABAPENTIN 300 MG PO CAPS: 300.0000 mg | ORAL_CAPSULE | Freq: Every day | ORAL | 0 refills | Status: DC

## 2022-05-05 MED ORDER — DICLOFENAC SODIUM 1 % EX GEL: 4.0000 g | Freq: Four times a day (QID) | CUTANEOUS | 0 refills | Status: DC

## 2022-05-05 MED ORDER — TRIAMCINOLONE ACETONIDE 0.1 % EX CREA
1.0000 | TOPICAL_CREAM | Freq: Two times a day (BID) | CUTANEOUS | 0 refills | Status: DC
Start: 2022-05-05 — End: 2022-05-05

## 2022-05-05 MED ORDER — TRIAMCINOLONE ACETONIDE 0.1 % EX CREA: 1.0000 | TOPICAL_CREAM | Freq: Two times a day (BID) | CUTANEOUS | 0 refills | Status: DC

## 2022-05-05 NOTE — Assessment & Plan Note (Signed)
Check CBC, CMP, B12 and folate, vitamin D, Patient encouraged to engage in regular daily walking exercises at least 150 minutes weekly

## 2022-05-05 NOTE — Patient Instructions (Addendum)
Please apply kenalog cream  two times daily to the affected site for itching    Apply 4g of volatren gel up to 4 times daily as needed for right shoulder pain  , please take gabapentin '300mg'$  once daily    It is important that you exercise regularly at least 30 minutes 5 times a week.  Think about what you will eat, plan ahead. Choose " clean, green, fresh or frozen" over canned, processed or packaged foods which are more sugary, salty and fatty. 70 to 75% of food eaten should be vegetables and fruit. Three meals at set times with snacks allowed between meals, but they must be fruit or vegetables. Aim to eat over a 12 hour period , example 7 am to 7 pm, and STOP after  your last meal of the day. Drink water,generally about 64 ounces per day, no other drink is as healthy. Fruit juice is best enjoyed in a healthy way, by EATING the fruit.  Thanks for choosing [facility], we consider it a privelige to serve you.

## 2022-05-05 NOTE — Assessment & Plan Note (Addendum)
BP Readings from Last 3 Encounters:  05/05/22 (!) 165/96  04/06/22 (!) 161/99  03/23/22 (!) 182/102  Condition condition remains elevated. Doubt compliance with medication Currently on amlodipine 10 mg daily, labetalol 100 mg twice daily, lisinopril hydrochlorothiazide 20- 12.5 mg 2 tablets daily. Patient with follow-up next week in the office patient encouraged to bring all her medications with her to her follow-up visit. Getting her medications in bubble packs to assist with adherence Diet advised patient was encouraged to engage in regular daily moderate exercises at least 150 minutes weekly

## 2022-05-05 NOTE — Assessment & Plan Note (Addendum)
Kenalog 0.1% cream ordered apply to affected site twice daily

## 2022-05-05 NOTE — Assessment & Plan Note (Signed)
  Lab Results  Component Value Date   HGBA1C 6.4 (H) 10/23/2021  currently on metformin 1000 mg daily Avoid  sugar sweets soda Check A1c today ACE and statin

## 2022-05-05 NOTE — Assessment & Plan Note (Signed)
Was on Neurontin in the past but not currently Start Neurontin 300 mg daily Voltaren gel ordered to be of apply to affected site up to 4 times daily as needed patient referred to orthopedics

## 2022-05-05 NOTE — Assessment & Plan Note (Signed)
Last vitamin D Lab Results  Component Value Date   VD25OH 25.2 (L) 10/23/2021  She is not on vitamin D 1000 units daily, I doubt compliance with med  check vitamin D levels today
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
## 2022-05-05 NOTE — Assessment & Plan Note (Signed)
Chronic uncontrolled condition PHQ-9 score 17 Currently on Lexapro 10 mg daily, med compliance has been an issue  She did denies SI, HI today Patient was referred to psych yesterday Has been attending counseling sessions

## 2022-05-06 ENCOUNTER — Other Ambulatory Visit: Payer: Self-pay | Admitting: Nurse Practitioner

## 2022-05-06 DIAGNOSIS — R718 Other abnormality of red blood cells: Secondary | ICD-10-CM

## 2022-05-06 DIAGNOSIS — R7401 Elevation of levels of liver transaminase levels: Secondary | ICD-10-CM

## 2022-05-06 LAB — CMP14+EGFR
ALT: 17 IU/L (ref 0–32)
AST: 11 IU/L (ref 0–40)
Albumin/Globulin Ratio: 1.4 (ref 1.2–2.2)
Albumin: 4.3 g/dL (ref 3.8–4.9)
Alkaline Phosphatase: 135 IU/L — ABNORMAL HIGH (ref 44–121)
BUN/Creatinine Ratio: 22 (ref 9–23)
BUN: 24 mg/dL (ref 6–24)
Bilirubin Total: <0.2 mg/dL (ref 0.0–1.2)
CO2: 24 mmol/L (ref 20–29)
Calcium: 9.8 mg/dL (ref 8.7–10.2)
Chloride: 102 mmol/L (ref 96–106)
Creatinine, Ser: 1.11 mg/dL — ABNORMAL HIGH (ref 0.57–1.00)
Globulin, Total: 3 g/dL (ref 1.5–4.5)
Glucose: 101 mg/dL — ABNORMAL HIGH (ref 70–99)
Potassium: 4.8 mmol/L (ref 3.5–5.2)
Sodium: 142 mmol/L (ref 134–144)
Total Protein: 7.3 g/dL (ref 6.0–8.5)
eGFR: 58 mL/min/{1.73_m2} — ABNORMAL LOW (ref >59)

## 2022-05-06 LAB — CBC
Hematocrit: 38 % (ref 34.0–46.6)
Hemoglobin: 12.1 g/dL (ref 11.1–15.9)
MCH: 24.8 pg — ABNORMAL LOW (ref 26.6–33.0)
MCHC: 31.8 g/dL (ref 31.5–35.7)
MCV: 78 fL — ABNORMAL LOW (ref 79–97)
Platelets: 198 10*3/uL (ref 150–450)
RBC: 4.87 x10E6/uL (ref 3.77–5.28)
RDW: 15.5 % — ABNORMAL HIGH (ref 11.7–15.4)
WBC: 6.8 10*3/uL (ref 3.4–10.8)

## 2022-05-06 LAB — VITAMIN D 25 HYDROXY (VIT D DEFICIENCY, FRACTURES): Vit D, 25-Hydroxy: 29.8 ng/mL — ABNORMAL LOW (ref 30.0–100.0)

## 2022-05-06 LAB — B12 AND FOLATE PANEL
Folate: 9.4 ng/mL (ref >3.0)
Vitamin B-12: 436 pg/mL (ref 232–1245)

## 2022-05-06 LAB — HEMOGLOBIN A1C
Est. average glucose Bld gHb Est-mCnc: 143 mg/dL
Hgb A1c MFr Bld: 6.6 % — ABNORMAL HIGH (ref 4.8–5.6)

## 2022-05-06 NOTE — Progress Notes (Signed)
I have added on iron panel and GGT, please notify labs, thanks

## 2022-05-10 ENCOUNTER — Other Ambulatory Visit: Payer: 59 | Admitting: Pharmacist

## 2022-05-10 ENCOUNTER — Telehealth: Payer: Self-pay | Admitting: Pharmacist

## 2022-05-10 NOTE — Progress Notes (Signed)
Attempted to contact patient for scheduled appointment for medication management. Left HIPAA compliant message for patient to return my call at their convenience.   Catie T. Rock Sobol, PharmD, BCACP [facility] [phone]  

## 2022-05-10 NOTE — Progress Notes (Signed)
I added on some labs , please find out the status of the labs from lab. Thanks

## 2022-05-11 ENCOUNTER — Ambulatory Visit: Payer: 59 | Admitting: Nurse Practitioner

## 2022-05-12 LAB — IRON: Iron: 53 ug/dL (ref 27–159)

## 2022-05-12 LAB — SPECIMEN STATUS REPORT

## 2022-05-12 NOTE — Progress Notes (Signed)
Kidney function is stable, avoid use of ibuprofen   Liver enzymes remains elevated , GGT was not done by labs, we can follow up on this at her next visit. Avoid excessive use of tylenol , avoid alcohol  A1C is stable   Vitamin D level  is slightly low , start taking OTC vitamin D 1000 units daily   Please bring all medications to your next appointment

## 2022-05-13 ENCOUNTER — Telehealth: Payer: Self-pay

## 2022-05-13 LAB — FECAL OCCULT BLOOD, IMMUNOCHEMICAL: IFOBT: NEGATIVE

## 2022-05-13 NOTE — Telephone Encounter (Signed)
-----   Message from Renee Rival, FNP sent at 05/11/2022  7:59 AM EDT ----- Please call the patient and schedule a follow up visit this week or next week for her blood pressure and to discuss her labs. She should bring all her medications with her to that appointment.  Thanks

## 2022-05-13 NOTE — Telephone Encounter (Signed)
Called pt to schedule f/u appt this week or next week per fola no answer no vm

## 2022-05-14 ENCOUNTER — Other Ambulatory Visit: Payer: Self-pay | Admitting: Pharmacist

## 2022-05-14 DIAGNOSIS — I10 Essential (primary) hypertension: Secondary | ICD-10-CM

## 2022-05-14 MED ORDER — SPIRONOLACTONE 25 MG PO TABS: 12.5000 mg | ORAL_TABLET | Freq: Every day | ORAL | 3 refills | Status: DC

## 2022-05-14 NOTE — Progress Notes (Signed)
Care Coordination Call  Connected with patient. She is now using medication adherence packaging from [place]. They have filled all in the morning:  - Metformin XR 500 mg - two tablets - Oxybutynin XL 10 mg  - Lisinopril/HCTZ 20/12.5 mg - two tablets - Escitalopram 10 mg  - Atorvastatin 10 mg  - Amlodipine 10 mg   They did not fill labetolol - though I believe spironolactone would be a better choice for her as a fourth agent. I have previously discussed this with PCP.   Home BP today - 168/101 87, repeat after 5 minutes of rest remained elevated.   Recommend addition of spironolactone. Discussed with PCP, she was in agreement. Order sent to [facility]. They note they can add the spironolactone to adherence packages on Monday, patient would need to bring the adherence package to the pharmacy.   Called patient, left voicemail informing her of this. I will call back next week to confirm.   Will also continue to collaborate with office staff to be scheduled for a blood pressure follow up and BMP idealy ~7-10 days after starting spironolactone.   Catie Hedwig Morton, PharmD, [facility] [phone]

## 2022-05-14 NOTE — Telephone Encounter (Signed)
Called pt no answer left vm 

## 2022-05-18 ENCOUNTER — Ambulatory Visit (INDEPENDENT_AMBULATORY_CARE_PROVIDER_SITE_OTHER): Payer: 59

## 2022-05-18 ENCOUNTER — Ambulatory Visit (INDEPENDENT_AMBULATORY_CARE_PROVIDER_SITE_OTHER): Payer: 59 | Admitting: Orthopedic Surgery

## 2022-05-18 ENCOUNTER — Encounter: Payer: Self-pay | Admitting: Orthopedic Surgery

## 2022-05-18 VITALS — BP 156/89 | HR 80 | Ht 61.0 in | Wt 268.0 lb

## 2022-05-18 DIAGNOSIS — G8929 Other chronic pain: Secondary | ICD-10-CM | POA: Diagnosis not present

## 2022-05-18 DIAGNOSIS — M25511 Pain in right shoulder: Secondary | ICD-10-CM | POA: Diagnosis not present

## 2022-05-18 LAB — SPECIMEN STATUS REPORT

## 2022-05-18 LAB — GAMMA GT

## 2022-05-18 MED ORDER — METHYLPREDNISOLONE ACETATE 40 MG/ML IJ SUSP
40.0000 mg | Freq: Once | INTRAMUSCULAR | Status: AC
  Administered 2022-05-18: 40 mg via INTRA_ARTICULAR

## 2022-05-18 NOTE — Patient Instructions (Signed)

## 2022-05-19 ENCOUNTER — Encounter: Payer: Self-pay | Admitting: Orthopedic Surgery
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
## 2022-05-21 ENCOUNTER — Ambulatory Visit: Payer: Self-pay | Admitting: Nurse Practitioner

## 2022-05-22 DIAGNOSIS — I10 Essential (primary) hypertension: Secondary | ICD-10-CM

## 2022-05-22 DIAGNOSIS — Z7984 Long term (current) use of oral hypoglycemic drugs: Secondary | ICD-10-CM | POA: Diagnosis not present

## 2022-05-22 DIAGNOSIS — E1159 Type 2 diabetes mellitus with other circulatory complications: Secondary | ICD-10-CM

## 2022-05-25 ENCOUNTER — Encounter: Payer: Self-pay | Admitting: Nurse Practitioner

## 2022-05-31 ENCOUNTER — Ambulatory Visit (HOSPITAL_COMMUNITY): Payer: 59 | Attending: Orthopedic Surgery | Admitting: Occupational Therapy

## 2022-06-01 ENCOUNTER — Encounter: Payer: Self-pay | Admitting: Internal Medicine

## 2022-06-01 ENCOUNTER — Ambulatory Visit (INDEPENDENT_AMBULATORY_CARE_PROVIDER_SITE_OTHER): Payer: 59 | Admitting: Internal Medicine

## 2022-06-01 VITALS — BP 164/98 | HR 81 | Resp 16 | Ht 61.0 in | Wt 265.1 lb

## 2022-06-01 DIAGNOSIS — I10 Essential (primary) hypertension: Secondary | ICD-10-CM

## 2022-06-01 NOTE — Progress Notes (Signed)
   Acute Office Visit  CC: Hypertension  HPI:[name] is a 56 y.o. female who presents for evaluation of hypertension. For the details of today's visit, please refer to the assessment and plan.  Past Medical History:  Diagnosis Date   Arthritis    back pain and knee pain, no meds   Asthma    Chest pain    + dyspnea   CVA (cerebral infarction) 1984 , 1988   x2, left sided weakness, history of dizziness   Depression with anxiety    History- no meds   Dysphagia    Fasting hyperglycemia    Gastroesophageal reflux disease    no meds   Headache(784.0)    OTC med PRN   History of anemia    S/p transfusions in 1999 at [facility] after vaginal bleeding; normal CBC and 09/2011   Hyperlipidemia    lipid profile in 11/2011: 203, 106, 58, 124   Hypertension    Lab 09/2011: Normal CMet except glucose of 109-169 and albumin-3.4; normal BP 09/2011-12/2011   Sleep apnea    Does not use CPAP - no longer has CPAP machine   Stroke (HCC)     Review of Systems:    Review of Systems  Constitutional:  Negative for chills and fever.  Eyes:  Negative for blurred vision.  Neurological:  Negative for dizziness and headaches.     Physical Exam: Vitals:   06/01/22 1042  BP: (!) 164/98  Pulse: 81  Resp: 16  SpO2: 94%  Weight: 265 lb 1.9 oz (120.3 kg)  Height: '5\' 1"'$  (1.549 m)     Physical Exam Constitutional:      Appearance: Normal appearance.  Cardiovascular:     Rate and Rhythm: Normal rate and regular rhythm.  Pulmonary:     Effort: Pulmonary effort is normal.     Breath sounds: Normal breath sounds.  Skin:    General: Skin is warm and dry.  Neurological:     Mental Status: She is alert.      Assessment & Plan:   Hypertension  Patient's BP today is 164/98 with a goal of <140/80. She did not take her medications today. She currently is on amlodipine 10 mg and lisinopril- hydrochlorothiazide 20 -12.5. She is not taking spirolactone and I removed this from medication list.  She was not taking her medication everyday before it was bubble packed recently. After medication was bubble packed she has had episodes of dizziness. She did not taker her medications before driving to appointment today because of these symptoms.   Assessment /Plan:Uncontrolled HTN. I am unsure if patient needs more medication because she has not taken her prescribed regimen today. She is having the feeling of being lightheaded at home and I will hold off on increasing antihypertensives today. Patient is going to take her blood pressure at home and bring a log in 1-2 weeks. If she has any feeling being lightheaded she is going to check her blood pressure.  Continue amlodipine 10 mg daily  Continue lisinopril-HCTZ 20 - 12.5 mg daily     Lorene Dy, MD

## 2022-06-01 NOTE — Assessment & Plan Note (Addendum)
  Patient's BP today is 164/98 with a goal of <140/80. She did not take her medications today. She currently is on amlodipine 10 mg and lisinopril- hydrochlorothiazide 20 -12.5. She is not taking spirolactone and I removed this from medication list. She was not taking her medication everyday before it was bubble packed recently. After medication was bubble packed she has had episodes of dizziness. She did not taker her medications before driving to appointment today because of these symptoms.   Assessment /Plan:Uncontrolled HTN. I am unsure if patient needs more medication because she has not taken her prescribed regimen today. She is having the feeling of being lightheaded at home and I will hold off on increasing antihypertensives today. Patient is going to take her blood pressure at home and bring a log in 1-2 weeks. If she has any feeling being lightheaded she is going to check her blood pressure.  Continue amlodipine 10 mg daily  Continue lisinopril-HCTZ 20 - 12.5 mg daily

## 2022-06-01 NOTE — Patient Instructions (Signed)
Thank you for trusting me with your care. To recap, today we discussed the following:   High Blood Pressure - Check your blood pressure for one week and come back to clinic for me to adjust medication - If you feel light headed , take your blood pressure.

## 2022-06-02 ENCOUNTER — Other Ambulatory Visit: Payer: 59 | Admitting: Pharmacist

## 2022-06-02 ENCOUNTER — Telehealth: Payer: Self-pay | Admitting: Internal Medicine

## 2022-06-02 DIAGNOSIS — I10 Essential (primary) hypertension: Secondary | ICD-10-CM

## 2022-06-02 MED ORDER — OLMESARTAN-AMLODIPINE-HCTZ 40-10-25 MG PO TABS: 1.0000 | ORAL_TABLET | Freq: Every day | ORAL | 1 refills | Status: DC

## 2022-06-02 NOTE — Telephone Encounter (Signed)
Contacted by Barnetta Chapel Harper,RPH-CPP and patients home blood pressure 199/114 HR 82 with a repeat of 201/107, HR 76. Her medication is being bubble packed by pharmacy. Will stop current regimen and start combination pill below. Follow up in office in one week. If still elevated will consider workup for secondary HTN before adding Spirolactone. Attempted to call patient to discuss but unable to reach her.   Primary hypertension - Olmesartan-amLODIPine-HCTZ 40-10-25 MG TABS; Take 1 tablet by mouth daily at 6 (six) AM.  Dispense: 30 tablet; Refill: 1

## 2022-06-02 NOTE — Progress Notes (Signed)
06/02/2022 Name: Gwendolyn Woodward MRN: [id number] DOB: [birth year]  Chief Complaint  Patient presents with   Medication Management   Hypertension    Gwendolyn Woodward is a 56 y.o. year old female who presented for a telephone visit.   They were referred to the pharmacist by their PCP for assistance in managing hypertension.    Subjective:  Care Team: Primary Care Provider: Alvira Monday, [place] ; Next Scheduled Visit: 06/10/22  Medication Access/Adherence  Current Pharmacy:  [facility], [place] [address] Phone: [phone] Fax: [phone]   Patient reports affordability concerns with their medications: No  Patient reports access/transportation concerns to their pharmacy: No  Patient reports adherence concerns with their medications:  Yes  she admits she often forgets to take evening medications, so we worked to package medications all in the morning   Hypertension:  Current medications: lisinopril/HCTZ 20/12.5 2 tablets daily, amlodipine 10 mg daily, spironolactone 12.5 mg daily - realized through medication review that this medication had not been packaged yet, is in packages now Medications previously tried: prescribed labetolol but did not start due to twice daily administration  Patient has an automated, upper arm home BP cuff but she has not taken to the office for validation Current blood pressure readings readings:   She reports that her blood pressure this morning around 7 am was 167/97, then she took her regular morning medications (which would be amlodipine 10, lisinopril 40/25), and reports to me (after 5 minutes of rest) that her home BP machine is reading 199/114 HR 82 with a repeat of 201/107, HR 76. She does endorse a headache.   Reports periodic dizziness and nausea after taking her medications. Due to this, she did not take medications prior to office visit with Dr. Court Joy yesterday. Does report  dizziness happens when standing, but difficult to tell in our conversation if positional. Due to this, Dr. Court Joy discontinued spironolactone yesterday but he was not aware that she had not started it yet  Health Maintenance  Health Maintenance Due  Topic Date Due   COVID-19 Vaccine (1) Never done   HIV Screening  Never done   Hepatitis C Screening  Never done   Zoster Vaccines- Shingrix (1 of 2) Never done   COLONOSCOPY (Pts 45-43yr Insurance coverage will need to be confirmed)  01/26/2022   PAP SMEAR-Modifier  05/03/2022     Objective: Lab Results  Component Value Date   HGBA1C 6.6 (H) 05/05/2022    Lab Results  Component Value Date   CREATININE 1.11 (H) 05/05/2022   BUN 24 05/05/2022   NA 142 05/05/2022   K 4.8 05/05/2022   CL 102 05/05/2022   CO2 24 05/05/2022    Lab Results  Component Value Date   CHOL 212 (H) 10/23/2021   HDL 73 10/23/2021   LDLCALC 118 (H) 10/23/2021   TRIG 119 10/23/2021   CHOLHDL 2.9 10/23/2021    Medications Reviewed Today     Reviewed by HOsker Mason RPH-CPP (Pharmacist) on 06/02/22 at [place] Status: <None>   Medication Order Taking? Sig Documenting Provider Last Dose Status Informant  ACCU-CHEK GUIDE test strip [id number]  [provider]  Active   Accu-Chek Softclix Lancets lancets [id number] Once daily testing dx e11.9 PRenee Rival FNP  Active   amLODipine (NORVASC) 10 MG tablet [id number] Take 1 tablet (10 mg total) by mouth daily. PRenee Rival FNP  Taking Active   atorvastatin (LIPITOR) 10 MG tablet [id number] Yes Take 1 tablet (10 mg total) by mouth at bedtime. Renee Rival, FNP Taking Active   Cholecalciferol (VITAMIN D3) 25 MCG (1000 UT) CAPS [id number] Yes Take 1 capsule (1,000 Units total) by mouth daily. Renee Rival, FNP Taking Active   Cyanocobalamin (VITAMIN B12 PO) [id number] Yes Take by mouth. Once daily [provider] Taking Active Self  escitalopram (LEXAPRO)  10 MG tablet [id number] Yes TAKE 1 TABLET BY MOUTH EVERY DAY [place], Dewaine Conger, FNP Taking Active   gabapentin (NEURONTIN) 300 MG capsule [id number]  Take 1 capsule (300 mg total) by mouth daily. Renee Rival, FNP  Active   lisinopril-hydrochlorothiazide (ZESTORETIC) 20-12.5 MG tablet [id number] Yes Take 2 tablets by mouth daily. Renee Rival, FNP Taking Active   metFORMIN (GLUCOPHAGE-XR) 500 MG 24 hr tablet [id number] Yes Take 2 tablets (1,000 mg total) by mouth daily. Renee Rival, FNP Taking Active   oxybutynin (DITROPAN XL) 10 MG 24 hr tablet [id number] Yes Take 1 tablet (10 mg total) by mouth daily. Renee Rival, FNP Taking Active               Assessment/Plan:   Hypertension: - Currently uncontrolled - Confirmed with [facility] that spironolactone had not been delivered. Collaborated with Dr. Court Joy. He will discontinue lisinopril/HCTZ, amlodipine, and spironolactone and start amlodipine/olmesartan/HCTZ. He will see her in office next week for follow up - Patient educated to bring home BP cuff to appointment next week. Educated to take home medications prior to appointment, even if someone needs to drive her to the appointment. Encouraged to continue check home blood pressures. - Advised that if chest pain, worsening headache, slurred speech, facial droop, or weakness to seek emergency medical care.     Follow Up Plan: phone call in 2 days  Catie TJodi Mourning, PharmD, [facility] [phone]

## 2022-06-04 ENCOUNTER — Other Ambulatory Visit: Payer: 59 | Admitting: Pharmacist

## 2022-06-04 NOTE — Progress Notes (Signed)
Care Coordination Call  Contacted patient for follow up. She received the new adherence packages from [employer]. She read off her medications to me; they dispensed and packaged olmesartan/amlodipine/HCTZ 40/10/25 and spironolactone 12.5 mg. She reports her BP this morning around 7 am was 134/94, HR 70. She does report some dizziness/fatigue, but does not feel this is any worse than usual. I asked her to recheck, she reported 155/94 with a HR of 76.   Will communicate to Dr. Court Joy that the spironolactone was packaged. Reminded patient to take home machine with her to appointment next week for comparison.   Catie Hedwig Morton, PharmD, [facility] [phone]

## 2022-06-08 ENCOUNTER — Ambulatory Visit (INDEPENDENT_AMBULATORY_CARE_PROVIDER_SITE_OTHER): Payer: 59 | Admitting: Internal Medicine

## 2022-06-08 ENCOUNTER — Other Ambulatory Visit: Payer: Self-pay | Admitting: Nurse Practitioner

## 2022-06-08 ENCOUNTER — Ambulatory Visit (INDEPENDENT_AMBULATORY_CARE_PROVIDER_SITE_OTHER): Payer: 59 | Admitting: *Deleted

## 2022-06-08 ENCOUNTER — Encounter: Payer: Self-pay | Admitting: Internal Medicine

## 2022-06-08 VITALS — BP 112/78 | HR 96 | Resp 16 | Ht 61.0 in | Wt 264.0 lb

## 2022-06-08 DIAGNOSIS — Z8673 Personal history of transient ischemic attack (TIA), and cerebral infarction without residual deficits: Secondary | ICD-10-CM

## 2022-06-08 DIAGNOSIS — I10 Essential (primary) hypertension: Secondary | ICD-10-CM | POA: Diagnosis not present

## 2022-06-08 DIAGNOSIS — E7841 Elevated Lipoprotein(a): Secondary | ICD-10-CM | POA: Diagnosis not present

## 2022-06-08 DIAGNOSIS — E119 Type 2 diabetes mellitus without complications: Secondary | ICD-10-CM

## 2022-06-08 MED ORDER — ATORVASTATIN CALCIUM 40 MG PO TABS: 40.0000 mg | ORAL_TABLET | Freq: Every day | ORAL | 11 refills | Status: DC
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
## 2022-06-08 NOTE — Patient Instructions (Signed)
Visit Information  Thank you for taking time to visit with me today. Please don't hesitate to contact me if I can be of assistance to you before our next scheduled telephone appointment.  Following are the goals we discussed today:  Continue checking blood sugar twice daily and recording Continue checking blood pressure everyday and recording, take to your doctor's appointment Follow carbohydrate modified diet, too much bread, pasta, rice, potatoes will raise blood sugar Take all medications as prescribed Continue working with pharmacist  Advanced directives packet mailed- please look over and complete Follow low sodium diet- read food labels for sodium content, get someone to help you if needed Continue going to [facility] for exercise- keep up the good work! Exercise and getting outdoors in the sun can help your depression Continue your therapy at [facility] taking medications as prescribed  Our next appointment is by telephone on 07/13/22 at 11 am  Please call the care guide team at [phone] if you need to cancel or reschedule your appointment.   If you are experiencing a Mental Health or [place] or need someone to talk to, please call the Suicide and Crisis Lifeline: 988 call the [place] National Suicide Prevention Lifeline: 787-319-8629 or TTY: (770)671-2019 TTY (209)770-8014) to talk to a trained counselor call 1-800-273-TALK (toll free, 24 hour hotline) go to [facility] [address], [place] [phone]) call the [place]: [phone] call 911   The patient verbalized understanding of instructions, educational materials, and care plan provided today and DECLINED offer to receive copy of patient instructions, educational materials, and care plan.   Jacqlyn Larsen [facility], BSN RN Case Manager [facility] [phone]

## 2022-06-08 NOTE — Patient Instructions (Signed)
Thank you for trusting me with your care. To recap, today we discussed the following:   High blood pressure - Basic metabolic panel - Continue current medications  Elevated cholesterol, history of stroke. - LDL not a goal, will increase medication dose.  - atorvastatin (LIPITOR) 40 MG tablet; Take 1 tablet (40 mg total) by mouth at bedtime.  Dispense: 30 tablet; Refill: 11

## 2022-06-08 NOTE — Progress Notes (Unsigned)
     CC: hypertension  HPI:Gwendolyn Woodward is a 56 y.o. female who presents for evaluation of hypertension. For the details of today's visit, please refer to the assessment and plan.  Past Medical History:  Diagnosis Date   Arthritis    back pain and knee pain, no meds   Asthma    Chest pain    + dyspnea   CVA (cerebral infarction) 1984 , 1988   x2, left sided weakness, history of dizziness   Depression with anxiety    History- no meds   Dysphagia    Fasting hyperglycemia    Gastroesophageal reflux disease    no meds   Headache(784.0)    OTC med PRN   History of anemia    S/p transfusions in 1999 at [facility] after vaginal bleeding; normal CBC and 09/2011   Hyperlipidemia    lipid profile in 11/2011: 203, 106, 58, 124   Hypertension    Lab 09/2011: Normal CMet except glucose of 109-169 and albumin-3.4; normal BP 09/2011-12/2011   Sleep apnea    Does not use CPAP - no longer has CPAP machine   Stroke [facility])     Review of Systems:    Review of Systems  Cardiovascular:  Negative for chest pain and palpitations.  Musculoskeletal:  Negative for falls.  Neurological:  Positive for dizziness (chronic,last for seconds). Negative for weakness and headaches.     Physical Exam: Vitals:   06/08/22 1309  BP: 112/78  Pulse: 96  Resp: 16  SpO2: 94%  Weight: 264 lb (119.7 kg)  Height: '5\' 1"'$  (1.549 m)     Physical Exam Constitutional:      General: She is not in acute distress.    Appearance: She is obese.  Cardiovascular:     Rate and Rhythm: Normal rate and regular rhythm.  Pulmonary:     Effort: Pulmonary effort is normal.     Breath sounds: Normal breath sounds.  Musculoskeletal:     Right lower leg: No edema.     Left lower leg: No edema.      Assessment & Plan:   Hypertension  Patient's BP today is 112/78 with a goal of <140/80. The patient endorses adherence to her medication.  medication regimen. She does have some lightheaded. We discussed previous stroke  and symptoms could have began after stroke. Symptoms not worsened by blood pressure medication. Denies swelling in her feet. BP log from home shows SBP 125-135 at home.   Assessment/Plan: HTN, controlled. Continue current regimen of Olmesartan-amlodipine-HCTZ 40-10-25 and Spirolactone 25 mg.     Hyperlipidemia Lipid Panel     Component Value Date/Time   CHOL 212 (H) 10/23/2021 1206   TRIG 119 10/23/2021 1206   HDL 73 10/23/2021 1206   CHOLHDL 2.9 10/23/2021 1206   CHOLHDL 2.6 12/06/2016 1441   VLDL 19 12/06/2016 1441   LDLCALC 118 (H) 10/23/2021 1206   LABVLDL 21 10/23/2021 1206   Patient has history of stroke. Currently on atorvastatin 10 mg. Tolerating medication well. LDL 118.   Assessment/Plan: Hyperlipidemia, not a goal. Increase atorvastatin to high intensity for secondary prevention.    Lorene Dy, MD

## 2022-06-09 ENCOUNTER — Other Ambulatory Visit: Payer: Self-pay | Admitting: Family Medicine

## 2022-06-09 DIAGNOSIS — E119 Type 2 diabetes mellitus without complications: Secondary | ICD-10-CM

## 2022-06-09 DIAGNOSIS — I10 Essential (primary) hypertension: Secondary | ICD-10-CM

## 2022-06-09 NOTE — Assessment & Plan Note (Signed)
Lipid Panel     Component Value Date/Time   CHOL 212 (H) 10/23/2021 1206   TRIG 119 10/23/2021 1206   HDL 73 10/23/2021 1206   CHOLHDL 2.9 10/23/2021 1206   CHOLHDL 2.6 12/06/2016 1441   VLDL 19 12/06/2016 1441   LDLCALC 118 (H) 10/23/2021 1206   LABVLDL 21 10/23/2021 1206   Patient has history of stroke. Currently on atorvastatin 10 mg. Tolerating medication well. LDL 118.   Assessment/Plan: Hyperlipidemia, not a goal. Increase atorvastatin to high intensity for secondary prevention.

## 2022-06-09 NOTE — Assessment & Plan Note (Addendum)
  Patient's BP today is 112/78 with a goal of <140/80. The patient endorses adherence to her medication.  medication regimen. She does have some lightheaded. We discussed previous stroke and symptoms could have began after stroke. Symptoms not worsened by blood pressure medication. Denies swelling in her feet. BP log from home shows SBP 125-135 at home.   Assessment/Plan: HTN, controlled. Continue current regimen of Olmesartan-amlodipine-HCTZ 40-10-25 and Spirolactone 25 mg.

## 2022-06-10 ENCOUNTER — Other Ambulatory Visit: Payer: Self-pay | Admitting: Family Medicine

## 2022-06-10 ENCOUNTER — Ambulatory Visit: Payer: 59 | Admitting: Family Medicine

## 2022-06-22 DIAGNOSIS — I10 Essential (primary) hypertension: Secondary | ICD-10-CM

## 2022-06-22 DIAGNOSIS — E119 Type 2 diabetes mellitus without complications: Secondary | ICD-10-CM

## 2022-06-23 ENCOUNTER — Ambulatory Visit (HOSPITAL_COMMUNITY): Payer: 59 | Admitting: Occupational Therapy

## 2022-06-25 ENCOUNTER — Ambulatory Visit (HOSPITAL_COMMUNITY): Payer: 59 | Attending: Orthopedic Surgery | Admitting: Occupational Therapy

## 2022-06-25 DIAGNOSIS — G8929 Other chronic pain: Secondary | ICD-10-CM | POA: Diagnosis present

## 2022-06-25 DIAGNOSIS — M25611 Stiffness of right shoulder, not elsewhere classified: Secondary | ICD-10-CM | POA: Insufficient documentation

## 2022-06-25 DIAGNOSIS — R29898 Other symptoms and signs involving the musculoskeletal system: Secondary | ICD-10-CM | POA: Diagnosis present

## 2022-06-25 DIAGNOSIS — M25511 Pain in right shoulder: Secondary | ICD-10-CM | POA: Diagnosis not present
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
## 2022-06-27 ENCOUNTER — Encounter (HOSPITAL_COMMUNITY): Payer: Self-pay | Admitting: Occupational Therapy

## 2022-06-30 ENCOUNTER — Ambulatory Visit (HOSPITAL_COMMUNITY): Payer: 59 | Admitting: Occupational Therapy

## 2022-06-30 ENCOUNTER — Telehealth: Payer: Self-pay | Admitting: Radiology

## 2022-06-30 ENCOUNTER — Encounter (HOSPITAL_COMMUNITY): Payer: Self-pay

## 2022-06-30 ENCOUNTER — Telehealth (HOSPITAL_COMMUNITY): Payer: 59 | Admitting: Psychiatry

## 2022-06-30 DIAGNOSIS — M25611 Stiffness of right shoulder, not elsewhere classified: Secondary | ICD-10-CM

## 2022-06-30 DIAGNOSIS — R29898 Other symptoms and signs involving the musculoskeletal system: Secondary | ICD-10-CM

## 2022-06-30 DIAGNOSIS — M25511 Pain in right shoulder: Secondary | ICD-10-CM | POA: Diagnosis not present

## 2022-06-30 DIAGNOSIS — G8929 Other chronic pain: Secondary | ICD-10-CM

## 2022-06-30 MED ORDER — CYCLOBENZAPRINE HCL 10 MG PO TABS: 10.0000 mg | ORAL_TABLET | Freq: Two times a day (BID) | ORAL | 0 refills | Status: DC | PRN

## 2022-06-30 NOTE — Telephone Encounter (Signed)
Patient came in and requested an appt, per PT.  Scheduled for Monday.   Patient also asks for a muscle relaxer to be sent in to [facility].
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
## 2022-07-01 ENCOUNTER — Encounter (HOSPITAL_COMMUNITY): Payer: Self-pay | Admitting: Occupational Therapy

## 2022-07-02 ENCOUNTER — Encounter (HOSPITAL_COMMUNITY): Payer: 59 | Admitting: Occupational Therapy

## 2022-07-05 ENCOUNTER — Ambulatory Visit: Payer: 59 | Admitting: Orthopedic Surgery

## 2022-07-06 ENCOUNTER — Ambulatory Visit (HOSPITAL_COMMUNITY): Payer: 59 | Admitting: Occupational Therapy

## 2022-07-06 ENCOUNTER — Encounter (HOSPITAL_COMMUNITY): Payer: Self-pay | Admitting: Occupational Therapy

## 2022-07-06 ENCOUNTER — Encounter: Payer: Self-pay | Admitting: Orthopedic Surgery

## 2022-07-06 ENCOUNTER — Ambulatory Visit (INDEPENDENT_AMBULATORY_CARE_PROVIDER_SITE_OTHER): Payer: 59 | Admitting: Orthopedic Surgery

## 2022-07-06 VITALS — Ht 61.0 in | Wt 264.0 lb

## 2022-07-06 DIAGNOSIS — R29898 Other symptoms and signs involving the musculoskeletal system: Secondary | ICD-10-CM

## 2022-07-06 DIAGNOSIS — M25511 Pain in right shoulder: Secondary | ICD-10-CM | POA: Diagnosis not present

## 2022-07-06 DIAGNOSIS — G8929 Other chronic pain: Secondary | ICD-10-CM

## 2022-07-06 DIAGNOSIS — M25611 Stiffness of right shoulder, not elsewhere classified: Secondary | ICD-10-CM
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
## 2022-07-06 NOTE — Patient Instructions (Signed)

## 2022-07-06 NOTE — Patient Instructions (Addendum)
Continue working with PT  Continue taking the muscle relaxers  We will plan to see you back in clinic in 6 weeks.  If pain continues to get worse while working with therapy, please contact the clinic earlier, and we can recommend an MRI.

## 2022-07-07 NOTE — Progress Notes (Signed)
New Patient Visit  Assessment: Gwendolyn Woodward is a 56 y.o. female with the following: 1. Chronic right shoulder pain  Plan: Gwendolyn Woodward continues to have pain in her right shoulder.  It is unclear why physical therapy sent her back to clinic earlier than previously planned.  Nonetheless, I have recommended she continue to work with therapy.  She is not at risk of worsening the current condition of her right shoulder.  She states her understanding.  Flexeril as needed.  Follow-up in 6 weeks  Follow-up: Return in about 6 weeks (around 08/17/2022).  Subjective:  Chief Complaint  Patient presents with   Shoulder Pain    Rt shoulder pain after PT     History of Present Illness: Gwendolyn Woodward is a 56 y.o. female who returns for evaluation of right shoulder pain.  She has returned earlier than previously planned.  She has started work with physical therapy, but has only had a couple of sessions.  She states the pain in her right shoulder, is primarily within the muscle.  Nothing is changed since I saw her last.  She has noted some change in the pain overall, since starting therapy.  Review of Systems: No fevers or chills No numbness or tingling No chest pain No shortness of breath No bowel or bladder dysfunction No GI distress No headaches   Objective: Ht '5\' 1"'$  (1.549 m)   Wt 264 lb (119.7 kg)   BMI 49.88 kg/m   Physical Exam:  General: Alert and oriented. and No acute distress. Gait: Normal gait.  Right shoulder without deformity.  Diffuse tenderness to palpation.  No obvious swelling.  120 degrees of forward flexion before that it is uncomfortable.  Internal rotation to lumbar spine.  Pain with provocative testing.  Fingers are warm and well-perfused.  IMAGING: I personally ordered and reviewed the following images  No new imaging obtained today   New Medications:  No orders of the defined types were placed in this encounter.     Mordecai Rasmussen,  MD  07/07/2022 11:12 PM

## 2022-07-08 ENCOUNTER — Encounter (HOSPITAL_COMMUNITY): Payer: 59 | Admitting: Occupational Therapy

## 2022-07-12 ENCOUNTER — Ambulatory Visit (HOSPITAL_COMMUNITY): Payer: 59 | Admitting: Occupational Therapy

## 2022-07-12 DIAGNOSIS — M25511 Pain in right shoulder: Secondary | ICD-10-CM | POA: Diagnosis not present

## 2022-07-12 DIAGNOSIS — G8929 Other chronic pain: Secondary | ICD-10-CM

## 2022-07-12 DIAGNOSIS — R29898 Other symptoms and signs involving the musculoskeletal system: Secondary | ICD-10-CM

## 2022-07-12 DIAGNOSIS — M25611 Stiffness of right shoulder, not elsewhere classified: Secondary | ICD-10-CM
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
## 2022-07-13 ENCOUNTER — Ambulatory Visit (INDEPENDENT_AMBULATORY_CARE_PROVIDER_SITE_OTHER): Payer: 59 | Admitting: *Deleted

## 2022-07-13 DIAGNOSIS — E119 Type 2 diabetes mellitus without complications: Secondary | ICD-10-CM

## 2022-07-13 DIAGNOSIS — I10 Essential (primary) hypertension: Secondary | ICD-10-CM
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
## 2022-07-13 NOTE — Patient Instructions (Signed)
Please call the care guide team at [phone] if you need to cancel or reschedule your appointment.   If you are experiencing a Mental Health or [place] or need someone to talk to, please call the Suicide and Crisis Lifeline: 988 call the [place] National Suicide Prevention Lifeline: (802) 027-9395 or TTY: (302)780-8923 TTY (320)874-9696) to talk to a trained counselor call 1-800-273-TALK (toll free, 24 hour hotline) go to [facility] [place] [phone]) call the [facility]: [phone] call 911   Following is a copy of your full provider care plan:   Goals Addressed             This Visit's Progress    CCM (DIABETES) EXPECTED OUTCOME:  MONITOR, SELF-MANAGE AND REDUCE SYMPTOMS OF DIABETES       Current Barriers:  Knowledge Deficits related to Diabetes management Chronic Disease Management support and education needs related to Diabetes, diet Patient reports checks CBG once daily fasting with readings usually 98-130 range with today's reading 108. Patient reports she does not always adhere to special diet, sometimes drinks sugary soft drinks and eats crackers. Patient reports she has had 2 falls this past year  Planned Interventions: Provided education to patient about basic DM disease process; Reviewed medications with patient and discussed importance of medication adherence;        Reviewed prescribed diet with patient carbohydrate modified; Counseled on importance of regular laboratory monitoring as prescribed;        Discussed plans with patient for ongoing care management follow up and provided patient with direct contact information for care management team;      Provided patient with written educational materials related to hypo and hyperglycemia and importance of correct treatment;       call provider for findings outside established parameters;       Review of patient status,  including review of consultants reports, relevant laboratory and other test results, and medications completed;       Screening for signs and symptoms of depression related to chronic disease state;        Assessed social determinant of health barriers;        Reviewed importance of exercise in management of Diabetes Reviewed safety precautions  Symptom Management: Take medications as prescribed   Attend all scheduled provider appointments Call pharmacy for medication refills 3-7 days in advance of running out of medications Attend church or other social activities Perform all self care activities independently  Perform IADL's (shopping, preparing meals, housekeeping, managing finances) independently Call provider office for new concerns or questions  check blood sugar at prescribed times: once daily check feet daily for cuts, sores or redness enter blood sugar readings and medication or insulin into daily log take the blood sugar log to all doctor visits take the blood sugar meter to all doctor visits trim toenails straight across drink 6 to 8 glasses of water each day fill half of plate with vegetables limit fast food meals to no more than 1 per week manage portion size read food labels for fat, fiber, carbohydrates and portion size switch to sugar-free drinks Look over education mailed - hypoglycemia fall prevention strategies: change position slowly, use assistive device such as walker or cane (per provider recommendations) when walking, keep walkways clear, have good lighting in room. It is important to contact your provider if you have any falls, maintain muscle strength/tone by exercise per provider recommendations.  Follow Up Plan: Telephone follow up  appointment with care management team member scheduled for:  09/02/22 at 9 am       CCM (HYPERTENSION) EXPECTED OUTCOME: MONITOR, SELF-MANAGE AND REDUCE SYMPTOMS OF HYPERTENSION       Current Barriers:  Knowledge Deficits  related to Hypertension management Chronic Disease Management support and education needs related to Hypertension, diet No Advanced Directives in place- already has information at home Patient reports lives alone, is independent in all aspects of her care, has adult daughter she can call of if needed, goes to [employer] several times per week and exercises. Patient reports she tries to check blood pressure at least weekly, states readings have been "good" Patient states she has prefilled medication packs and this works very well for her Patient reports she is going to outpatient OT for right shoulder issues and states " this has really helped" Patient reports she continues outpatient group therapy at [facility] for mental health concerns and feels this is helping, pt had LCSW services in the past and does not feel this is needed at present  Planned Interventions: Evaluation of current treatment plan related to hypertension self management and patient's adherence to plan as established by provider;   Reviewed prescribed diet low sodium Reviewed medications with patient and discussed importance of compliance;  Counseled on the importance of exercise goals with target of 150 minutes per week Discussed plans with patient for ongoing care management follow up and provided patient with direct contact information for care management team; Advised patient, providing education and rationale, to monitor blood pressure daily and record, calling PCP for findings outside established parameters;  Discussed complications of poorly controlled blood pressure such as heart disease, stroke, circulatory complications, vision complications, kidney impairment, sexual dysfunction;  Screening for signs and symptoms of depression related to chronic disease state;  Assessed social determinant of health barriers;  Pain assessment completed Reviewed importance of continuing to exercise regularly at [facility] in  [place]  Symptom Management: Take medications as prescribed   Attend all scheduled provider appointments Call pharmacy for medication refills 3-7 days in advance of running out of medications Attend church or other social activities Perform all self care activities independently  Perform IADL's (shopping, preparing meals, housekeeping, managing finances) independently Call provider office for new concerns or questions  check blood pressure 3 times per week choose a place to take my blood pressure (home, clinic or office, retail store) write blood pressure results in a log or diary learn about high blood pressure keep a blood pressure log take blood pressure log to all doctor appointments keep all doctor appointments take medications for blood pressure exactly as prescribed report new symptoms to your doctor eat more whole grains, fruits and vegetables, lean meats and healthy fats Look over education mailed- low sodium diet Continue to exercise at [facility]- keep up the good work! Try to limit sugary soft drinks, choose unsweetened tea, water Continue at [facility]  Follow Up Plan: Telephone follow up appointment with care management team member scheduled for:  09/02/22 at 9 am          The patient verbalized understanding of instructions, educational materials, and care plan provided today and agreed to receive a mailed copy of patient instructions, educational materials, and care plan.   Telephone follow up appointment with care management team member scheduled for:  09/02/22 at 9 am  Low-Sodium Eating Plan Sodium, which is an element that makes up salt, helps you maintain a healthy balance of fluids  in your body. Too much sodium can increase your blood pressure and cause fluid and waste to be held in your body. Your health care provider or dietitian may recommend following this plan if you have high blood pressure (hypertension), kidney disease,  liver disease, or heart failure. Eating less sodium can help lower your blood pressure, reduce swelling, and protect your heart, liver, and kidneys. What are tips for following this plan? Reading food labels The Nutrition Facts label lists the amount of sodium in one serving of the food. If you eat more than one serving, you must multiply the listed amount of sodium by the number of servings. Choose foods with less than 140 mg of sodium per serving. Avoid foods with 300 mg of sodium or more per serving. Shopping  Look for lower-sodium products, often labeled as "low-sodium" or "no salt added." Always check the sodium content, even if foods are labeled as "unsalted" or "no salt added." Buy fresh foods. Avoid canned foods and pre-made or frozen meals. Avoid canned, cured, or processed meats. Buy breads that have less than 80 mg of sodium per slice. Cooking  Eat more home-cooked food and less restaurant, buffet, and fast food. Avoid adding salt when cooking. Use salt-free seasonings or herbs instead of table salt or sea salt. Check with your health care provider or pharmacist before using salt substitutes. Cook with plant-based oils, such as canola, sunflower, or olive oil. Meal planning When eating at a restaurant, ask that your food be prepared with less salt or no salt, if possible. Avoid dishes labeled as brined, pickled, cured, smoked, or made with soy sauce, miso, or teriyaki sauce. Avoid foods that contain MSG (monosodium glutamate). MSG is sometimes added to [place] food, bouillon, and some canned foods. Make meals that can be grilled, baked, poached, roasted, or steamed. These are generally made with less sodium. General information Most people on this plan should limit their sodium intake to 1,500-2,000 mg (milligrams) of sodium each day. What foods should I eat? Fruits Fresh, frozen, or canned fruit. Fruit juice. Vegetables Fresh or frozen vegetables. "No salt added" canned  vegetables. "No salt added" tomato sauce and paste. Low-sodium or reduced-sodium tomato and vegetable juice. Grains Low-sodium cereals, including oats, puffed wheat and rice, and shredded wheat. Low-sodium crackers. Unsalted rice. Unsalted pasta. Low-sodium bread. Whole-grain breads and whole-grain pasta. Meats and other proteins Fresh or frozen (no salt added) meat, poultry, seafood, and fish. Low-sodium canned tuna and salmon. Unsalted nuts. Dried peas, beans, and lentils without added salt. Unsalted canned beans. Eggs. Unsalted nut butters. Dairy Milk. Soy milk. Cheese that is naturally low in sodium, such as ricotta cheese, fresh mozzarella, or Swiss cheese. Low-sodium or reduced-sodium cheese. Cream cheese. Yogurt. Seasonings and condiments Fresh and dried herbs and spices. Salt-free seasonings. Low-sodium mustard and ketchup. Sodium-free salad dressing. Sodium-free light mayonnaise. Fresh or refrigerated horseradish. Lemon juice. Vinegar. Other foods Homemade, reduced-sodium, or low-sodium soups. Unsalted popcorn and pretzels. Low-salt or salt-free chips. The items listed above may not be a complete list of foods and beverages you can eat. Contact a dietitian for more information. What foods should I avoid? Vegetables Sauerkraut, pickled vegetables, and relishes. Olives. [place] fries. Onion rings. Regular canned vegetables (not low-sodium or reduced-sodium). Regular canned tomato sauce and paste (not low-sodium or reduced-sodium). Regular tomato and vegetable juice (not low-sodium or reduced-sodium). Frozen vegetables in sauces. Grains Instant hot cereals. Bread stuffing, pancake, and biscuit mixes. Croutons. Seasoned rice or pasta mixes. Noodle soup cups. Boxed or  frozen macaroni and cheese. Regular salted crackers. Self-rising flour. Meats and other proteins Meat or fish that is salted, canned, smoked, spiced, or pickled. Precooked or cured meat, such as sausages or meat loaves. Berniece Salines. Ham.  Pepperoni. Hot dogs. Corned beef. Chipped beef. Salt pork. Jerky. Pickled herring. Anchovies and sardines. Regular canned tuna. Salted nuts. Dairy Processed cheese and cheese spreads. Hard cheeses. Cheese curds. Blue cheese. Feta cheese. String cheese. Regular cottage cheese. Buttermilk. Canned milk. Fats and oils Salted butter. Regular margarine. Ghee. Bacon fat. Seasonings and condiments Onion salt, garlic salt, seasoned salt, table salt, and sea salt. Canned and packaged gravies. Worcestershire sauce. Tartar sauce. Barbecue sauce. Teriyaki sauce. Soy sauce, including reduced-sodium. Steak sauce. Fish sauce. Oyster sauce. Cocktail sauce. Horseradish that you find on the shelf. Regular ketchup and mustard. Meat flavorings and tenderizers. Bouillon cubes. Hot sauce. Pre-made or packaged marinades. Pre-made or packaged taco seasonings. Relishes. Regular salad dressings. Salsa. Other foods Salted popcorn and pretzels. Corn chips and puffs. Potato and tortilla chips. Canned or dried soups. Pizza. Frozen entrees and pot pies. The items listed above may not be a complete list of foods and beverages you should avoid. Contact a dietitian for more information. Summary Eating less sodium can help lower your blood pressure, reduce swelling, and protect your heart, liver, and kidneys. Most people on this plan should limit their sodium intake to 1,500-2,000 mg (milligrams) of sodium each day. Canned, boxed, and frozen foods are high in sodium. Restaurant foods, fast foods, and pizza are also very high in sodium. You also get sodium by adding salt to food. Try to cook at home, eat more fresh fruits and vegetables, and eat less fast food and canned, processed, or prepared foods. This information is not intended to replace advice given to you by your health care provider. Make sure you discuss any questions you have with your health care provider. Document Revised: 09/14/2019 Document Reviewed: 07/11/2019 Elsevier  Patient Education  [place]. Hypoglycemia Hypoglycemia occurs when the level of sugar (glucose) in the blood is too low. Hypoglycemia can happen in people who have or do not have diabetes. It can develop quickly, and it can be a medical emergency. For most people, a blood glucose level below 70 mg/dL (3.9 mmol/L) is considered hypoglycemia. Glucose is a type of sugar that provides the body's main source of energy. Certain hormones (insulin and glucagon) control the level of glucose in the blood. Insulin lowers blood glucose, and glucagon raises blood glucose. Hypoglycemia can result from having too much insulin in the bloodstream, or from not eating enough food that contains glucose. You may also have reactive hypoglycemia, which happens within 4 hours after eating a meal. What are the causes? Hypoglycemia occurs most often in people who have diabetes and may be caused by: Diabetes medicine. Not eating enough, or not eating often enough. Increased physical activity. Drinking alcohol on an empty stomach. If you do not have diabetes, hypoglycemia may be caused by: A tumor in the pancreas. Not eating enough, or not eating for long periods at a time (fasting). A severe infection or illness. Problems after having bariatric surgery. Organ failure, such as kidney or liver failure. Certain medicines. What increases the risk? Hypoglycemia is more likely to develop in people who: Have diabetes and take medicines to lower blood glucose. Abuse alcohol. Have a severe illness. What are the signs or symptoms? Symptoms vary depending on whether the condition is mild, moderate, or severe. Mild hypoglycemia Hunger. Sweating and  feeling clammy. Dizziness or feeling light-headed. Sleepiness or restless sleep. Nausea. Increased heart rate. Headache. Blurry vision. Mood changes, such as irritability or anxiety. Tingling or numbness around the mouth, lips, or tongue. Moderate  hypoglycemia Confusion and poor judgment. Behavior changes. Weakness. Irregular heartbeat. A change in coordination. Severe hypoglycemia Severe hypoglycemia is a medical emergency. It can cause: Fainting. Seizures. Loss of consciousness (coma). Death. How is this diagnosed? Hypoglycemia is diagnosed with a blood test to measure your blood glucose level. This blood test is done while you are having symptoms. Your health care provider may also do a physical exam and review your medical history. How is this treated? This condition can be treated by immediately eating or drinking something that contains sugar with 15 grams of fast-acting carbohydrate, such as: 4 oz (120 mL) of fruit juice. 4 oz (120 mL) of regular soda (not diet soda). Several pieces of hard candy. Check food labels to find out how many pieces to eat for 15 grams. 1 Tbsp (15 mL) of sugar or honey. 4 glucose tablets. 1 tube of glucose gel. Treating hypoglycemia if you have diabetes If you are alert and able to swallow safely, follow the 15:15 rule: Take 15 grams of a fast-acting carbohydrate. Talk with your health care provider about how much you should take. Options for getting 15 grams of fast-acting carbohydrate include: Glucose tablets (take 4 tablets). Several pieces of hard candy. Check food labels to find out how many pieces to eat for 15 grams. 4 oz (120 mL) of fruit juice. 4 oz (120 mL) of regular soda (not diet soda). 1 Tbsp (15 mL) of sugar or honey. 1 tube of glucose gel. Check your blood glucose 15 minutes after you take the carbohydrate. If the repeat blood glucose level is still at or below 70 mg/dL (3.9 mmol/L), take 15 grams of a carbohydrate again. If your blood glucose level does not increase above 70 mg/dL (3.9 mmol/L) after 3 tries, seek emergency medical care. After your blood glucose level returns to normal, eat a meal or a snack within 1 hour.  Treating severe hypoglycemia Severe hypoglycemia  is when your blood glucose level is below 54 mg/dL (3 mmol/L). Severe hypoglycemia is a medical emergency. Get medical help right away. If you have severe hypoglycemia and you cannot eat or drink, you will need to be given glucagon. A family member or close friend should learn how to check your blood glucose and how to give you glucagon. Ask your health care provider if you need to have an emergency glucagon kit available. Severe hypoglycemia may need to be treated in a hospital. The treatment may include getting glucose through an IV. You may also need treatment for the cause of your hypoglycemia. Follow these instructions at home:  General instructions Take over-the-counter and prescription medicines only as told by your health care provider. Monitor your blood glucose as told by your health care provider. If you drink alcohol: Limit how much you have to: 0-1 drink a day for women who are not pregnant. 0-2 drinks a day for men. Know how much alcohol is in your drink. In the [place] one drink equals one 12 oz bottle of beer (355 mL), one 5 oz glass of wine (148 mL), or one 1 oz glass of hard liquor (44 mL). Be sure to eat food along with drinking alcohol. Be aware that alcohol is absorbed quickly and may have lingering effects that may result in hypoglycemia later. Be sure to do  ongoing glucose monitoring. Keep all follow-up visits. This is important. If you have diabetes: Always have a fast-acting carbohydrate (15 grams) option with you to treat low blood glucose. Follow your diabetes management plan as directed by your health care provider. Make sure you: Know the symptoms of hypoglycemia. It is important to treat it right away to prevent it from becoming severe. Check your blood glucose as often as told. Always check before and after exercise. Always check your blood glucose before you drive a motorized vehicle. Take your medicines as told. Follow your meal plan. Eat on time, and do not  skip meals. Share your diabetes management plan with people in your workplace, school, and household. Carry a medical alert card or wear medical alert jewelry. Where to find more information American Diabetes Association: www.diabetes.org Contact a health care provider if: You have problems keeping your blood glucose in your target range. You have frequent episodes of hypoglycemia. Get help right away if: You continue to have hypoglycemia symptoms after eating or drinking something that contains 15 grams of fast-acting carbohydrate, and you cannot get your blood glucose above 70 mg/dL (3.9 mmol/L) while following the 15:15 rule. Your blood glucose is below 54 mg/dL (3 mmol/L). You have a seizure. You faint. These symptoms may represent a serious problem that is an emergency. Do not wait to see if the symptoms will go away. Get medical help right away. Call your local emergency services (911 in the [place] Do not drive yourself to the hospital. Summary Hypoglycemia occurs when the level of sugar (glucose) in the blood is too low. Hypoglycemia can happen in people who have or do not have diabetes. It can develop quickly, and it can be a medical emergency. Make sure you know the symptoms of hypoglycemia and how to treat it. Always have a fast-acting carbohydrate option with you to treat low blood sugar. This information is not intended to replace advice given to you by your health care provider. Make sure you discuss any questions you have with your health care provider. Document Revised: 07/10/2020 Document Reviewed: 07/10/2020 Elsevier Patient Education  [place].

## 2022-07-13 NOTE — Chronic Care Management (AMB) (Signed)
Chronic Care Management   CCM RN Visit Note  07/13/2022 Name: Gwendolyn Woodward MRN: [id number] DOB: [birth year]  Subjective: Gwendolyn Woodward is a 56 y.o. year old female who is a primary care patient of Alvira Monday, [place]. The patient was referred to the Chronic Care Management team for assistance with care management needs subsequent to provider initiation of CCM services and plan of care.    Today's Visit:  Engaged with patient by telephone for initial visit.     SDOH Interventions Today    Flowsheet Row Most Recent Value  SDOH Interventions   Food Insecurity Interventions Intervention Not Indicated  Housing Interventions Intervention Not Indicated  Transportation Interventions Intervention Not Indicated  Utilities Interventions Intervention Not Indicated  Financial Strain Interventions Intervention Not Indicated  Physical Activity Interventions Intervention Not Indicated  Stress Interventions Intervention Not Indicated  Social Connections Interventions Other (Comment), Intervention Not Indicated  [facility] [place], [facility] several times per week]         Goals Addressed             This Visit's Progress    CCM (DIABETES) EXPECTED OUTCOME:  MONITOR, SELF-MANAGE AND REDUCE SYMPTOMS OF DIABETES       Current Barriers:  Knowledge Deficits related to Diabetes management Chronic Disease Management support and education needs related to Diabetes, diet Patient reports checks CBG once daily fasting with readings usually 98-130 range with today's reading 108. Patient reports she does not always adhere to special diet, sometimes drinks sugary soft drinks and eats crackers. Patient reports she has had 2 falls this past year  Planned Interventions: Provided education to patient about basic DM disease process; Reviewed medications with patient and discussed importance of medication adherence;        Reviewed prescribed diet with patient carbohydrate modified; Counseled  on importance of regular laboratory monitoring as prescribed;        Discussed plans with patient for ongoing care management follow up and provided patient with direct contact information for care management team;      Provided patient with written educational materials related to hypo and hyperglycemia and importance of correct treatment;       call provider for findings outside established parameters;       Review of patient status, including review of consultants reports, relevant laboratory and other test results, and medications completed;       Screening for signs and symptoms of depression related to chronic disease state;        Assessed social determinant of health barriers;        Reviewed importance of exercise in management of Diabetes Reviewed safety precautions  Symptom Management: Take medications as prescribed   Attend all scheduled provider appointments Call pharmacy for medication refills 3-7 days in advance of running out of medications Attend church or other social activities Perform all self care activities independently  Perform IADL's (shopping, preparing meals, housekeeping, managing finances) independently Call provider office for new concerns or questions  check blood sugar at prescribed times: once daily check feet daily for cuts, sores or redness enter blood sugar readings and medication or insulin into daily log take the blood sugar log to all doctor visits take the blood sugar meter to all doctor visits trim toenails straight across drink 6 to 8 glasses of water each day fill half of plate with vegetables limit fast food meals to no more than 1 per week manage portion size read food labels for fat, fiber, carbohydrates  and portion size switch to sugar-free drinks Look over education mailed - hypoglycemia fall prevention strategies: change position slowly, use assistive device such as walker or cane (per provider recommendations) when walking, keep  walkways clear, have good lighting in room. It is important to contact your provider if you have any falls, maintain muscle strength/tone by exercise per provider recommendations.  Follow Up Plan: Telephone follow up appointment with care management team member scheduled for:  09/02/22 at 9 am       CCM (HYPERTENSION) EXPECTED OUTCOME: MONITOR, SELF-MANAGE AND REDUCE SYMPTOMS OF HYPERTENSION       Current Barriers:  Knowledge Deficits related to Hypertension management Chronic Disease Management support and education needs related to Hypertension, diet No Advanced Directives in place- already has information at home Patient reports lives alone, is independent in all aspects of her care, has adult daughter she can call of if needed, goes to [employer] several times per week and exercises. Patient reports she tries to check blood pressure at least weekly, states readings have been "good" Patient states she has prefilled medication packs and this works very well for her Patient reports she is going to outpatient OT for right shoulder issues and states " this has really helped" Patient reports she continues outpatient group therapy at [facility] for mental health concerns and feels this is helping, pt had LCSW services in the past and does not feel this is needed at present  Planned Interventions: Evaluation of current treatment plan related to hypertension self management and patient's adherence to plan as established by provider;   Reviewed prescribed diet low sodium Reviewed medications with patient and discussed importance of compliance;  Counseled on the importance of exercise goals with target of 150 minutes per week Discussed plans with patient for ongoing care management follow up and provided patient with direct contact information for care management team; Advised patient, providing education and rationale, to monitor blood pressure daily and record, calling PCP for findings outside  established parameters;  Discussed complications of poorly controlled blood pressure such as heart disease, stroke, circulatory complications, vision complications, kidney impairment, sexual dysfunction;  Screening for signs and symptoms of depression related to chronic disease state;  Assessed social determinant of health barriers;  Pain assessment completed Reviewed importance of continuing to exercise regularly at [facility] in [place]  Symptom Management: Take medications as prescribed   Attend all scheduled provider appointments Call pharmacy for medication refills 3-7 days in advance of running out of medications Attend church or other social activities Perform all self care activities independently  Perform IADL's (shopping, preparing meals, housekeeping, managing finances) independently Call provider office for new concerns or questions  check blood pressure 3 times per week choose a place to take my blood pressure (home, clinic or office, retail store) write blood pressure results in a log or diary learn about high blood pressure keep a blood pressure log take blood pressure log to all doctor appointments keep all doctor appointments take medications for blood pressure exactly as prescribed report new symptoms to your doctor eat more whole grains, fruits and vegetables, lean meats and healthy fats Look over education mailed- low sodium diet Continue to exercise at [facility]- keep up the good work! Try to limit sugary soft drinks, choose unsweetened tea, water Continue at [facility] and Occupational Therapy  Follow Up Plan: Telephone follow up appointment with care management team member scheduled for:  09/02/22 at 9 am  Plan:Telephone follow up appointment with care management team member scheduled for:  09/02/22 at 9 am  Jacqlyn Larsen [facility], BSN RN Case Manager [facility] [phone]

## 2022-07-14 ENCOUNTER — Encounter (HOSPITAL_COMMUNITY): Payer: 59 | Admitting: Occupational Therapy

## 2022-07-14 ENCOUNTER — Encounter (HOSPITAL_COMMUNITY): Payer: Self-pay | Admitting: Occupational Therapy

## 2022-07-20 ENCOUNTER — Encounter (HOSPITAL_COMMUNITY): Payer: Self-pay | Admitting: Occupational Therapy

## 2022-07-20 ENCOUNTER — Ambulatory Visit (HOSPITAL_COMMUNITY): Payer: 59 | Admitting: Occupational Therapy

## 2022-07-20 DIAGNOSIS — G8929 Other chronic pain: Secondary | ICD-10-CM

## 2022-07-20 DIAGNOSIS — M25611 Stiffness of right shoulder, not elsewhere classified: Secondary | ICD-10-CM

## 2022-07-20 DIAGNOSIS — M25511 Pain in right shoulder: Secondary | ICD-10-CM | POA: Diagnosis not present

## 2022-07-20 DIAGNOSIS — R29898 Other symptoms and signs involving the musculoskeletal system: Secondary | ICD-10-CM
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
## 2022-07-21 ENCOUNTER — Telehealth: Payer: Self-pay | Admitting: Family Medicine

## 2022-07-21 NOTE — Telephone Encounter (Signed)
Medical clearance for weight loss surgery  Copied Noted Sleeved

## 2022-07-22 ENCOUNTER — Encounter (HOSPITAL_COMMUNITY): Payer: 59 | Admitting: Occupational Therapy

## 2022-07-22 DIAGNOSIS — I10 Essential (primary) hypertension: Secondary | ICD-10-CM

## 2022-07-22 DIAGNOSIS — E1159 Type 2 diabetes mellitus with other circulatory complications: Secondary | ICD-10-CM

## 2022-07-26 ENCOUNTER — Ambulatory Visit: Payer: 59 | Admitting: Family Medicine

## 2022-07-26 ENCOUNTER — Encounter: Payer: Self-pay | Admitting: Family Medicine

## 2022-07-27 ENCOUNTER — Encounter (HOSPITAL_COMMUNITY): Payer: Self-pay | Admitting: Occupational Therapy

## 2022-07-27 ENCOUNTER — Ambulatory Visit (HOSPITAL_COMMUNITY): Payer: 59 | Attending: Orthopedic Surgery | Admitting: Occupational Therapy

## 2022-07-27 DIAGNOSIS — R29898 Other symptoms and signs involving the musculoskeletal system: Secondary | ICD-10-CM | POA: Insufficient documentation

## 2022-07-27 DIAGNOSIS — M25611 Stiffness of right shoulder, not elsewhere classified: Secondary | ICD-10-CM | POA: Insufficient documentation

## 2022-07-27 DIAGNOSIS — G8929 Other chronic pain: Secondary | ICD-10-CM | POA: Diagnosis present

## 2022-07-27 DIAGNOSIS — M25511 Pain in right shoulder: Secondary | ICD-10-CM | POA: Diagnosis present

## 2022-07-27 NOTE — Addendum Note (Signed)
Addended by: Marliss Coots on: 07/27/2022 04:55 PM   Modules accepted: Orders

## 2022-07-27 NOTE — Patient Instructions (Signed)
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
## 2022-07-28 ENCOUNTER — Other Ambulatory Visit: Payer: Self-pay | Admitting: Nurse Practitioner

## 2022-07-28 DIAGNOSIS — F339 Major depressive disorder, recurrent, unspecified: Secondary | ICD-10-CM

## 2022-07-28 DIAGNOSIS — G8929 Other chronic pain: Secondary | ICD-10-CM

## 2022-07-29 ENCOUNTER — Ambulatory Visit (HOSPITAL_COMMUNITY): Payer: 59 | Admitting: Occupational Therapy

## 2022-07-29 DIAGNOSIS — R29898 Other symptoms and signs involving the musculoskeletal system: Secondary | ICD-10-CM

## 2022-07-29 DIAGNOSIS — M25511 Pain in right shoulder: Secondary | ICD-10-CM | POA: Diagnosis not present

## 2022-07-29 DIAGNOSIS — G8929 Other chronic pain: Secondary | ICD-10-CM

## 2022-07-29 DIAGNOSIS — M25611 Stiffness of right shoulder, not elsewhere classified: Secondary | ICD-10-CM

## 2022-07-30 ENCOUNTER — Encounter (HOSPITAL_COMMUNITY): Payer: Self-pay | Admitting: Occupational Therapy
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
## 2022-08-03 ENCOUNTER — Encounter (HOSPITAL_COMMUNITY): Payer: 59 | Admitting: Occupational Therapy

## 2022-08-05 ENCOUNTER — Ambulatory Visit (HOSPITAL_COMMUNITY): Payer: 59 | Admitting: Occupational Therapy

## 2022-08-13 ENCOUNTER — Encounter (HOSPITAL_COMMUNITY): Payer: Self-pay | Admitting: Occupational Therapy

## 2022-08-13 ENCOUNTER — Encounter: Payer: Self-pay | Admitting: Family Medicine

## 2022-08-13 ENCOUNTER — Ambulatory Visit (INDEPENDENT_AMBULATORY_CARE_PROVIDER_SITE_OTHER): Payer: 59 | Admitting: Family Medicine

## 2022-08-13 ENCOUNTER — Ambulatory Visit (HOSPITAL_COMMUNITY): Payer: 59 | Admitting: Occupational Therapy

## 2022-08-13 VITALS — BP 122/78 | HR 73 | Ht 60.0 in | Wt 263.0 lb

## 2022-08-13 DIAGNOSIS — Z1159 Encounter for screening for other viral diseases: Secondary | ICD-10-CM

## 2022-08-13 DIAGNOSIS — E038 Other specified hypothyroidism: Secondary | ICD-10-CM

## 2022-08-13 DIAGNOSIS — E559 Vitamin D deficiency, unspecified: Secondary | ICD-10-CM

## 2022-08-13 DIAGNOSIS — Z6841 Body Mass Index (BMI) 40.0 and over, adult: Secondary | ICD-10-CM

## 2022-08-13 DIAGNOSIS — E119 Type 2 diabetes mellitus without complications: Secondary | ICD-10-CM | POA: Diagnosis not present

## 2022-08-13 DIAGNOSIS — Z114 Encounter for screening for human immunodeficiency virus [HIV]: Secondary | ICD-10-CM

## 2022-08-13 DIAGNOSIS — M25511 Pain in right shoulder: Secondary | ICD-10-CM | POA: Diagnosis not present

## 2022-08-13 DIAGNOSIS — M25611 Stiffness of right shoulder, not elsewhere classified: Secondary | ICD-10-CM

## 2022-08-13 DIAGNOSIS — G8929 Other chronic pain: Secondary | ICD-10-CM

## 2022-08-13 DIAGNOSIS — I10 Essential (primary) hypertension: Secondary | ICD-10-CM

## 2022-08-13 DIAGNOSIS — R29898 Other symptoms and signs involving the musculoskeletal system: Secondary | ICD-10-CM

## 2022-08-13 DIAGNOSIS — E7849 Other hyperlipidemia: Secondary | ICD-10-CM

## 2022-08-13 NOTE — Assessment & Plan Note (Signed)
She reports struggling with her weight since childhood She reports that she has been implementing lifestyle modification with increased physical activities and a heart healthy diet and has not been able to maintain a healthy weight She reports frustration with her inability to maintain a healthy weight She shares that she is interested in gastric bypass surgery; however, she is open to other nonsurgical options if possible Explained to patient that she is not a candidate for oral antiobesity medication We will check hemoglobin A1c and consider starting her on Ozempic

## 2022-08-13 NOTE — Patient Instructions (Addendum)
I appreciate the opportunity to provide care to you today!    Follow up:  3 months  Labs: please stop by the lab during the week to get your blood drawn (CBC, CMP, TSH, Lipid profile, HgA1c, Vit D)  Screening: HIV and Hep C   Please continue to a heart-healthy diet and increase your physical activities.   Physical activity helps: Lower your blood glucose, improve your heart health, lower your blood pressure and cholesterol, burn calories to help manage her weight, gave you energy, lower stress, and improve his sleep.  The American diabetes Association (ADA) recommends being active for 2-1/2 hours (150 minutes) or more week.  Exercise for 30 minutes, 5 days a week (150 minutes total)      It was a pleasure to see you and I look forward to continuing to work together on your health and well-being. Please do not hesitate to call the office if you need care or have questions about your care.   Have a wonderful day and week. With Gratitude, Alvira Monday MSN, FNP-BC

## 2022-08-13 NOTE — Progress Notes (Signed)
Established Patient Office Visit  Subjective:  Patient ID: Gwendolyn Woodward, female    DOB: 17-Nov-1965  Age: 56 y.o. MRN: [id number]  CC:  Chief Complaint  Patient presents with   Weight Loss    Patient is here to look into weight loss surgery or medication. Has a form for completion from bariatric office. States someone spoke with her about ozempic at one point and she would prefer a prescription rather than surgery.     HPI Gwendolyn Woodward is a 56 y.o. female with past medical history of hypertension, migraine headaches with aura, diabetes, obesity, and depression presents for f/u of  chronic medical conditions. For the details of today's visit, please refer to the assessment and plan.     Past Medical History:  Diagnosis Date   Arthritis    back pain and knee pain, no meds   Asthma    Chest pain    + dyspnea   CVA (cerebral infarction) 1984 , 1988   x2, left sided weakness, history of dizziness   Depression with anxiety    History- no meds   Dysphagia    Fasting hyperglycemia    Gastroesophageal reflux disease    no meds   Headache(784.0)    OTC med PRN   History of anemia    S/p transfusions in 1999 at [facility] after vaginal bleeding; normal CBC and 09/2011   Hyperlipidemia    lipid profile in 11/2011: 203, 106, 58, 124   Hypertension    Lab 09/2011: Normal CMet except glucose of 109-169 and albumin-3.4; normal BP 09/2011-12/2011   Sleep apnea    Does not use CPAP - no longer has CPAP machine   Stroke [facility])     Past Surgical History:  Procedure Laterality Date   [place]   x 2   COLONOSCOPY  01/27/2012   Procedure: COLONOSCOPY;  Surgeon: Danie Binder, MD;  [facility];  Service: Endoscopy;  Laterality: N/A;  8:30   ENDOMETRIAL ABLATION  Feb 2013   ESOPHAGOGASTRODUODENOSCOPY  5/11   [facility]-Dr Iva Lento GERD distal esophagus, NEGATIVE bx for Barretts   EXTERNAL FIXATION LEG Right 04/19/2020   Procedure: EXTERNAL FIXATION  RIGHT ANKLE;  Surgeon: Mcarthur Rossetti, MD;  Location: [place];  Service: Orthopedics;  Laterality: Right;   IUD REMOVAL  10/20/2011   Procedure: INTRAUTERINE DEVICE (IUD) REMOVAL;  Surgeon: Allena Katz, MD;  Location: [place] [facility];  Service: Gynecology;  Laterality: N/A;   OPEN REDUCTION INTERNAL FIXATION (ORIF) TIBIA/FIBULA FRACTURE Right 04/23/2020   Procedure: OPEN REDUCTION INTERNAL FIXATION (ORIF) TIBIA/FIBULA FRACTURE;  Surgeon: Shona Needles, MD;  Location: [place];  Service: Orthopedics;  Laterality: Right;   TUBAL LIGATION     WISDOM TOOTH EXTRACTION      Family History  Problem Relation Age of Onset   Cancer Father        Lung/Throat   Heart disease Father    Hypertension Mother    Asthma Mother    Diabetes Maternal Uncle    Anesthesia problems Neg Hx     Social History   Socioeconomic History   Marital status: Divorced    Spouse name: Not on file   Number of children: 2   Years of education: Not on file   Highest education level: Not on file  Occupational History   Occupation: sock boarder    Comment: just got new job in [place] Use   Smoking  status: Never   Smokeless tobacco: Never  Vaping Use   Vaping Use: Never used  Substance and Sexual Activity   Alcohol use: Not Currently    Comment: Socially, 2 times per yr   Drug use: No   Sexual activity: Yes    Birth control/protection: Surgical    Comment: tubal & ablation  Other Topics Concern   Not on file  Social History Narrative   Lives alone-2 grown children   Social Determinants of Health   Financial Resource Strain: Low Risk  (07/13/2022)   Overall Financial Resource Strain (CARDIA)    Difficulty of Paying Living Expenses: Not hard at all  Food Insecurity: No Food Insecurity (07/13/2022)   Hunger Vital Sign    Worried About Running Out of Food in the Last Year: Never true    Ran Out of Food in the Last Year: Never true  Transportation Needs: No Transportation Needs (07/13/2022)    PRAPARE - Hydrologist (Medical): No    Lack of Transportation (Non-Medical): No  Physical Activity: Insufficiently Active (07/13/2022)   Exercise Vital Sign    Days of Exercise per Week: 3 days    Minutes of Exercise per Session: 30 min  Stress: No Stress Concern Present (07/13/2022)   [place]    Feeling of Stress : Only a little  Recent Concern: Stress - Stress Concern Present (04/16/2022)   [place]    Feeling of Stress : To some extent  Social Connections: Moderately Integrated (07/13/2022)   Social Connection and Isolation Panel [NHANES]    Frequency of Communication with Friends and Family: Once a week    Frequency of Social Gatherings with Friends and Family: Three times a week    Attends Religious Services: More than 4 times per year    Active Member of Clubs or Organizations: Yes    Attends Music therapist: More than 4 times per year    Marital Status: Divorced  Human resources officer Violence: Not on file    Outpatient Medications Prior to Visit  Medication Sig Dispense Refill   ACCU-CHEK GUIDE test strip      Accu-Chek Softclix Lancets lancets Once daily testing dx e11.9 50 each 1   atorvastatin (LIPITOR) 40 MG tablet Take 1 tablet (40 mg total) by mouth at bedtime. 30 tablet 11   Cholecalciferol (VITAMIN D3) 25 MCG (1000 UT) CAPS Take 1 capsule (1,000 Units total) by mouth daily. 30 capsule 3   Cyanocobalamin (VITAMIN B12 PO) Take by mouth. Once daily     cyclobenzaprine (FLEXERIL) 10 MG tablet Take 1 tablet (10 mg total) by mouth 2 (two) times daily as needed for muscle spasms. 20 tablet 0   escitalopram (LEXAPRO) 10 MG tablet TAKE ONE TABLET BY MOUTH ONCE DAILY. 30 tablet 0   gabapentin (NEURONTIN) 300 MG capsule TAKE ONE CAPSULE BY MOUTH ONCE DAILY. 30 capsule 0   metFORMIN (GLUCOPHAGE-XR) 500 MG  24 hr tablet Take 2 tablets (1,000 mg total) by mouth daily. 180 tablet 1   Olmesartan-amLODIPine-HCTZ 40-10-25 MG TABS Take 1 tablet by mouth daily at 6 (six) AM. 30 tablet 1   oxybutynin (DITROPAN XL) 10 MG 24 hr tablet Take 1 tablet (10 mg total) by mouth daily. 30 tablet 12   spironolactone (ALDACTONE) 25 MG tablet Take 12.5 mg by mouth daily.     No facility-administered medications prior to visit.  Allergies  Allergen Reactions   Aspirin     INTERNAL BLEEDING   Tramadol Itching    ROS Review of Systems  Constitutional:  Negative for chills and fever.  Eyes:  Negative for visual disturbance.  Respiratory:  Negative for chest tightness and shortness of breath.   Neurological:  Negative for dizziness and headaches.      Objective:    Physical Exam Constitutional:      Appearance: She is obese.  HENT:     Head: Normocephalic.     Mouth/Throat:     Mouth: Mucous membranes are moist.  Cardiovascular:     Rate and Rhythm: Normal rate.     Heart sounds: Normal heart sounds.  Pulmonary:     Effort: Pulmonary effort is normal.     Breath sounds: Normal breath sounds.  Neurological:     Mental Status: She is alert.     BP 122/78   Pulse 73   Ht 5' (1.524 m)   Wt 263 lb (119.3 kg)   SpO2 91%   BMI 51.36 kg/m  Wt Readings from Last 3 Encounters:  08/13/22 263 lb (119.3 kg)  07/06/22 264 lb (119.7 kg)  06/08/22 264 lb (119.7 kg)    Lab Results  Component Value Date   TSH 0.948 10/23/2021   Lab Results  Component Value Date   WBC 6.8 05/05/2022   HGB 12.1 05/05/2022   HCT 38.0 05/05/2022   MCV 78 (L) 05/05/2022   PLT 198 05/05/2022   Lab Results  Component Value Date   NA 142 05/05/2022   K 4.8 05/05/2022   CO2 24 05/05/2022   GLUCOSE 101 (H) 05/05/2022   BUN 24 05/05/2022   CREATININE 1.11 (H) 05/05/2022   BILITOT <0.2 05/05/2022   ALKPHOS 135 (H) 05/05/2022   AST 11 05/05/2022   ALT 17 05/05/2022   PROT 7.3 05/05/2022   ALBUMIN 4.3  05/05/2022   CALCIUM 9.8 05/05/2022   ANIONGAP 9 04/25/2020   EGFR 58 (L) 05/05/2022   Lab Results  Component Value Date   CHOL 212 (H) 10/23/2021   Lab Results  Component Value Date   HDL 73 10/23/2021   Lab Results  Component Value Date   LDLCALC 118 (H) 10/23/2021   Lab Results  Component Value Date   TRIG 119 10/23/2021   Lab Results  Component Value Date   CHOLHDL 2.9 10/23/2021   Lab Results  Component Value Date   HGBA1C 6.6 (H) 05/05/2022      Assessment & Plan:  Class 3 severe obesity due to excess calories with serious comorbidity and body mass index (BMI) of 50.0 to 59.9 in adult [facility]) Assessment & Plan: She reports struggling with her weight since childhood She reports that she has been implementing lifestyle modification with increased physical activities and a heart healthy diet and has not been able to maintain a healthy weight She reports frustration with her inability to maintain a healthy weight She shares that she is interested in gastric bypass surgery; however, she is open to other nonsurgical options if possible Explained to patient that she is not a candidate for oral antiobesity medication We will check hemoglobin A1c and consider starting her on Ozempic    Type 2 diabetes mellitus without complication, without long-term current use of insulin (HCC) -     Hemoglobin A1c  Vitamin D deficiency -     VITAMIN D 25 Hydroxy (Vit-D Deficiency, Fractures)  Need for hepatitis C screening test -  Hepatitis C antibody  Other specified hypothyroidism -     TSH + free T4  Other hyperlipidemia -     Lipid panel  Primary hypertension -     CMP14+EGFR -     CBC with Differential/Platelet  Encounter for screening for HIV -     HIV Antibody (routine testing w rflx)    Follow-up: Return in about 3 months (around 11/12/2022).   Alvira Monday, FNP

## 2022-08-13 NOTE — Addendum Note (Signed)
Addended by: Marliss Coots on: 08/13/2022 03:53 PM   Modules accepted: Orders
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
## 2022-08-13 NOTE — Patient Instructions (Signed)

## 2022-08-18 ENCOUNTER — Encounter (HOSPITAL_COMMUNITY): Payer: Self-pay

## 2022-08-18 ENCOUNTER — Encounter (HOSPITAL_COMMUNITY): Payer: 59 | Admitting: Occupational Therapy

## 2022-08-24 ENCOUNTER — Ambulatory Visit: Payer: 59 | Admitting: Orthopedic Surgery

## 2022-08-25 ENCOUNTER — Other Ambulatory Visit: Payer: Self-pay | Admitting: Family Medicine

## 2022-08-25 LAB — CBC WITH DIFFERENTIAL/PLATELET
Basophils Absolute: 0 10*3/uL (ref 0.0–0.2)
Basos: 1 %
EOS (ABSOLUTE): 0.1 10*3/uL (ref 0.0–0.4)
Eos: 2 %
Hematocrit: 36.2 % (ref 34.0–46.6)
Hemoglobin: 11.3 g/dL (ref 11.1–15.9)
Immature Grans (Abs): 0 10*3/uL (ref 0.0–0.1)
Immature Granulocytes: 0 %
Lymphocytes Absolute: 1.7 10*3/uL (ref 0.7–3.1)
Lymphs: 28 %
MCH: 24.5 pg — ABNORMAL LOW (ref 26.6–33.0)
MCHC: 31.2 g/dL — ABNORMAL LOW (ref 31.5–35.7)
MCV: 79 fL (ref 79–97)
Monocytes Absolute: 0.3 10*3/uL (ref 0.1–0.9)
Monocytes: 5 %
Neutrophils Absolute: 4.1 10*3/uL (ref 1.4–7.0)
Neutrophils: 64 %
Platelets: 191 10*3/uL (ref 150–450)
RBC: 4.61 x10E6/uL (ref 3.77–5.28)
RDW: 14.5 % (ref 11.7–15.4)
WBC: 6.3 10*3/uL (ref 3.4–10.8)

## 2022-08-25 LAB — HEPATITIS C ANTIBODY: Hep C Virus Ab: NONREACTIVE

## 2022-08-25 LAB — LIPID PANEL
Chol/HDL Ratio: 2.8 ratio (ref 0.0–4.4)
Cholesterol, Total: 170 mg/dL (ref 100–199)
HDL: 61 mg/dL (ref >39)
LDL Chol Calc (NIH): 93 mg/dL (ref 0–99)
Triglycerides: 85 mg/dL (ref 0–149)
VLDL Cholesterol Cal: 16 mg/dL (ref 5–40)

## 2022-08-25 LAB — CMP14+EGFR
ALT: 15 IU/L (ref 0–32)
AST: 10 IU/L (ref 0–40)
Albumin/Globulin Ratio: 1.4 (ref 1.2–2.2)
Albumin: 3.7 g/dL — ABNORMAL LOW (ref 3.8–4.9)
Alkaline Phosphatase: 124 IU/L — ABNORMAL HIGH (ref 44–121)
BUN/Creatinine Ratio: 15 (ref 9–23)
BUN: 16 mg/dL (ref 6–24)
Bilirubin Total: <0.2 mg/dL (ref 0.0–1.2)
CO2: 24 mmol/L (ref 20–29)
Calcium: 9.3 mg/dL (ref 8.7–10.2)
Chloride: 104 mmol/L (ref 96–106)
Creatinine, Ser: 1.1 mg/dL — ABNORMAL HIGH (ref 0.57–1.00)
Globulin, Total: 2.6 g/dL (ref 1.5–4.5)
Glucose: 156 mg/dL — ABNORMAL HIGH (ref 70–99)
Potassium: 4 mmol/L (ref 3.5–5.2)
Sodium: 142 mmol/L (ref 134–144)
Total Protein: 6.3 g/dL (ref 6.0–8.5)
eGFR: 58 mL/min/{1.73_m2} — ABNORMAL LOW (ref >59)

## 2022-08-25 LAB — TSH+FREE T4
Free T4: 1.14 ng/dL (ref 0.82–1.77)
TSH: 0.611 u[IU]/mL (ref 0.450–4.500)

## 2022-08-25 LAB — HEMOGLOBIN A1C
Est. average glucose Bld gHb Est-mCnc: 143 mg/dL
Hgb A1c MFr Bld: 6.6 % — ABNORMAL HIGH (ref 4.8–5.6)

## 2022-08-25 LAB — VITAMIN D 25 HYDROXY (VIT D DEFICIENCY, FRACTURES): Vit D, 25-Hydroxy: 21.8 ng/mL — ABNORMAL LOW (ref 30.0–100.0)

## 2022-08-27 ENCOUNTER — Other Ambulatory Visit: Payer: Self-pay | Admitting: Family Medicine

## 2022-08-27 DIAGNOSIS — F339 Major depressive disorder, recurrent, unspecified: Secondary | ICD-10-CM

## 2022-08-27 DIAGNOSIS — G8929 Other chronic pain: Secondary | ICD-10-CM

## 2022-08-30 ENCOUNTER — Other Ambulatory Visit: Payer: Self-pay | Admitting: Family Medicine

## 2022-08-30 DIAGNOSIS — E119 Type 2 diabetes mellitus without complications: Secondary | ICD-10-CM

## 2022-08-30 MED ORDER — METFORMIN HCL ER 500 MG PO TB24: 1000.0000 mg | ORAL_TABLET | Freq: Two times a day (BID) | ORAL | 1 refills | Status: DC

## 2022-08-30 NOTE — Progress Notes (Signed)
Please encourage the patient to continue taking her vitamin D supplement.  A prescription for metformin 1000 mg twice daily has been sent to her pharmacy to take.  I also recommend decreasing her intake of foods high in sugar and increasing her physical activities.  Her labs indicate that she is dehydrated I recommend increasing her fluid intake, at least 64 ounces of water daily.  Her thyroid and cholesterol levels are stable.

## 2022-08-31 ENCOUNTER — Other Ambulatory Visit: Payer: Self-pay | Admitting: Family Medicine

## 2022-08-31 DIAGNOSIS — I10 Essential (primary) hypertension: Secondary | ICD-10-CM

## 2022-09-02 ENCOUNTER — Telehealth: Payer: 59

## 2022-09-02 ENCOUNTER — Telehealth: Payer: Self-pay | Admitting: *Deleted

## 2022-09-02 NOTE — Telephone Encounter (Signed)
   CCM RN Visit Note   09/02/2022 Name: [name] MRN: [id number]      DOB: [birth year]  Subjective: Gwendolyn Woodward is a 57 y.o. year old female who is a primary care patient of Alvira Monday FNP. The patient was referred to the Chronic Care Management team for assistance with care management needs subsequent to provider initiation of CCM services and plan of care.      An unsuccessful telephone outreach was attempted today to contact the patient about Chronic Care Management needs.    Plan:Telephone follow up appointment with care management team member scheduled for:  upon care guide rescheduling.  Jacqlyn Larsen [facility], BSN RN Case Manager [facility] [phone]

## 2022-09-07 ENCOUNTER — Other Ambulatory Visit: Payer: Self-pay | Admitting: Nurse Practitioner

## 2022-09-07 ENCOUNTER — Other Ambulatory Visit: Payer: Self-pay

## 2022-09-07 DIAGNOSIS — I10 Essential (primary) hypertension: Secondary | ICD-10-CM

## 2022-09-07 DIAGNOSIS — S90862A Insect bite (nonvenomous), left foot, initial encounter: Secondary | ICD-10-CM

## 2022-09-07 MED ORDER — OLMESARTAN-AMLODIPINE-HCTZ 40-10-25 MG PO TABS: 1.0000 | ORAL_TABLET | Freq: Every day | ORAL | 3 refills | Status: DC

## 2022-09-17 ENCOUNTER — Ambulatory Visit (INDEPENDENT_AMBULATORY_CARE_PROVIDER_SITE_OTHER): Payer: 59 | Admitting: *Deleted

## 2022-09-17 DIAGNOSIS — E119 Type 2 diabetes mellitus without complications: Secondary | ICD-10-CM

## 2022-09-17 DIAGNOSIS — I10 Essential (primary) hypertension: Secondary | ICD-10-CM

## 2022-09-17 NOTE — Patient Instructions (Signed)
Please call the care guide team at [phone] if you need to cancel or reschedule your appointment.   If you are experiencing a Mental Health or [place] or need someone to talk to, please call the Suicide and Crisis Lifeline: 988 call the [place] National Suicide Prevention Lifeline: 330-323-0370 or TTY: 908-173-9762 TTY 762-830-1049) to talk to a trained counselor call 1-800-273-TALK (toll free, 24 hour hotline) go to [facility] [address] [place] [phone]) call the [facility]: [phone] call 911   Following is a copy of the CCM Program Consent:  CCM service includes personalized support from designated clinical staff supervised by the physician, including individualized plan of care and coordination with other care providers 24/7 contact phone numbers for assistance for urgent and routine care needs. Service will only be billed when office clinical staff spend 20 minutes or more in a month to coordinate care. Only one practitioner may furnish and bill the service in a calendar month. The patient may stop CCM services at amy time (effective at the end of the month) by phone call to the office staff. The patient will be responsible for cost sharing (co-pay) or up to 20% of the service fee (after annual deductible is met)  Following is a copy of your full provider care plan:   Goals Addressed             This Visit's Progress    CCM (DIABETES) EXPECTED OUTCOME:  MONITOR, SELF-MANAGE AND REDUCE SYMPTOMS OF DIABETES       Current Barriers:  Knowledge Deficits related to Diabetes management Chronic Disease Management support and education needs related to Diabetes, diet Patient reports checks CBG once daily fasting with readings usually 105-148 range Patient reports she does not always adhere to special diet, sometimes drinks sugary soft drinks and eats crackers, reports she is trying to drink mostly  water Patient reports she has had 2 falls this past year Patient reports she has all medications and taking as prescribed  Planned Interventions: Reviewed medications with patient and discussed importance of medication adherence;        Reviewed prescribed diet with patient carbohydrate modified; Counseled on importance of regular laboratory monitoring as prescribed;        Discussed plans with patient for ongoing care management follow up and provided patient with direct contact information for care management team;      Provided patient with written educational materials related to hypo and hyperglycemia and importance of correct treatment;       Advised patient, providing education and rationale, to check cbg once daily and record        call provider for findings outside established parameters;       Review of patient status, including review of consultants reports, relevant laboratory and other test results, and medications completed;       Advised patient to discuss any issues with blood sugar, medications with provider;      Reinforced importance of exercise in management of Diabetes Reinforced safety precautions Reinforced instructions given by primary care provider- drink at least 64 ounce of fluid daily Reviewed importance of taking Vitamin D as prescribed  Symptom Management: Take medications as prescribed   Attend all scheduled provider appointments Call pharmacy for medication refills 3-7 days in advance of running out of medications Attend church or other social activities Perform all self care activities independently  Perform IADL's (shopping, preparing meals, housekeeping, managing finances) independently Call provider office  for new concerns or questions  check blood sugar at prescribed times: once daily check feet daily for cuts, sores or redness enter blood sugar readings and medication or insulin into daily log take the blood sugar log to all doctor visits take the  blood sugar meter to all doctor visits trim toenails straight across drink 6 to 8 glasses of water each day fill half of plate with vegetables limit fast food meals to no more than 1 per week manage portion size prepare main meal at home 3 to 5 days each week read food labels for fat, fiber, carbohydrates and portion size switch to sugar-free drinks wash and dry feet carefully every day wear comfortable, cotton socks wear comfortable, well-fitting shoes Take vitamin D as prescribed Drink at least 64 ounces of fluid daily fall prevention strategies: change position slowly, use assistive device such as walker or cane (per provider recommendations) when walking, keep walkways clear, have good lighting in room. It is important to contact your provider if you have any falls, maintain muscle strength/tone by exercise per provider recommendations.  Follow Up Plan: Telephone follow up appointment with care management team member scheduled for:  11/08/22 at 63 am       CCM (HYPERTENSION) EXPECTED OUTCOME: MONITOR, SELF-MANAGE AND REDUCE SYMPTOMS OF HYPERTENSION       Current Barriers:  Knowledge Deficits related to Hypertension management Chronic Disease Management support and education needs related to Hypertension, diet No Advanced Directives in place- already has information at home Patient reports lives alone, is independent in all aspects of her care, has adult daughter she can call of if needed,  has not been exercising as much at [facility] [employer]. Patient reports she tries to check blood pressure at least weekly, states readings have been "good" Patient states she has prefilled medication packs and this works very well for her Patient reports she has finished outpatient OT for right shoulder issues and states " this has really helped", pt reports she is using bands and continuing with exercises Patient reports she continues outpatient group therapy at [facility] for mental health concerns and  feels this is helping, pt had LCSW services in the past and does not feel this is needed at present Patient reports she has been helping baby sit her grandchildren  Planned Interventions: Evaluation of current treatment plan related to hypertension self management and patient's adherence to plan as established by provider;   Reviewed prescribed diet low sodium Reviewed medications with patient and discussed importance of compliance;  Counseled on the importance of exercise goals with target of 150 minutes per week Advised patient, providing education and rationale, to monitor blood pressure daily and record, calling PCP for findings outside established parameters;  Discussed complications of poorly controlled blood pressure such as heart disease, stroke, circulatory complications, vision complications, kidney impairment, sexual dysfunction;  Pain assessment completed Reinforced importance of continuing to exercise and using bands  Symptom Management: Take medications as prescribed   Attend all scheduled provider appointments Call pharmacy for medication refills 3-7 days in advance of running out of medications Attend church or other social activities Perform all self care activities independently  Perform IADL's (shopping, preparing meals, housekeeping, managing finances) independently Call provider office for new concerns or questions  check blood pressure 3 times per week choose a place to take my blood pressure (home, clinic or office, retail store) write blood pressure results in a log or diary learn about high blood pressure keep a blood pressure log take blood  pressure log to all doctor appointments call doctor for signs and symptoms of high blood pressure keep all doctor appointments take medications for blood pressure exactly as prescribed report new symptoms to your doctor eat more whole grains, fruits and vegetables, lean meats and healthy fats Follow low sodium  diet Continue to try and exercise at [facility] Try to limit sugary soft drinks, choose unsweetened tea, water Continue at [facility] for mental health counseling Continue using your bands at home  Follow Up Plan: Telephone follow up appointment with care management team member scheduled for:  11/08/22 at 1045 am          The patient verbalized understanding of instructions, educational materials, and care plan provided today and DECLINED offer to receive copy of patient instructions, educational materials, and care plan.   Telephone follow up appointment with care management team member scheduled for:  11/08/22 at 1045 am

## 2022-09-17 NOTE — Chronic Care Management (AMB) (Signed)
Chronic Care Management   CCM RN Visit Note  09/17/2022 Name: Gwendolyn Woodward MRN: [id number] DOB: 02-29-1966  Subjective: Gwendolyn Woodward is a 57 y.o. year old female who is a primary care patient of Alvira Monday, [place]. The patient was referred to the Chronic Care Management team for assistance with care management needs subsequent to provider initiation of CCM services and plan of care.    Today's Visit:  Engaged with patient by telephone for follow up visit.        Goals Addressed             This Visit's Progress    CCM (DIABETES) EXPECTED OUTCOME:  MONITOR, SELF-MANAGE AND REDUCE SYMPTOMS OF DIABETES       Current Barriers:  Knowledge Deficits related to Diabetes management Chronic Disease Management support and education needs related to Diabetes, diet Patient reports checks CBG once daily fasting with readings usually 105-148 range Patient reports she does not always adhere to special diet, sometimes drinks sugary soft drinks and eats crackers, reports she is trying to drink mostly water Patient reports she has had 2 falls this past year Patient reports she has all medications and taking as prescribed  Planned Interventions: Reviewed medications with patient and discussed importance of medication adherence;        Reviewed prescribed diet with patient carbohydrate modified; Counseled on importance of regular laboratory monitoring as prescribed;        Discussed plans with patient for ongoing care management follow up and provided patient with direct contact information for care management team;      Provided patient with written educational materials related to hypo and hyperglycemia and importance of correct treatment;       Advised patient, providing education and rationale, to check cbg once daily and record        call provider for findings outside established parameters;       Review of patient status, including review of consultants reports, relevant laboratory  and other test results, and medications completed;       Advised patient to discuss any issues with blood sugar, medications with provider;      Reinforced importance of exercise in management of Diabetes Reinforced safety precautions Reinforced instructions given by primary care provider- drink at least 64 ounce of fluid daily Reviewed importance of taking Vitamin D as prescribed  Symptom Management: Take medications as prescribed   Attend all scheduled provider appointments Call pharmacy for medication refills 3-7 days in advance of running out of medications Attend church or other social activities Perform all self care activities independently  Perform IADL's (shopping, preparing meals, housekeeping, managing finances) independently Call provider office for new concerns or questions  check blood sugar at prescribed times: once daily check feet daily for cuts, sores or redness enter blood sugar readings and medication or insulin into daily log take the blood sugar log to all doctor visits take the blood sugar meter to all doctor visits trim toenails straight across drink 6 to 8 glasses of water each day fill half of plate with vegetables limit fast food meals to no more than 1 per week manage portion size prepare main meal at home 3 to 5 days each week read food labels for fat, fiber, carbohydrates and portion size switch to sugar-free drinks wash and dry feet carefully every day wear comfortable, cotton socks wear comfortable, well-fitting shoes Take vitamin D as prescribed Drink at least 64 ounces of fluid daily fall prevention strategies: change  position slowly, use assistive device such as walker or cane (per provider recommendations) when walking, keep walkways clear, have good lighting in room. It is important to contact your provider if you have any falls, maintain muscle strength/tone by exercise per provider recommendations.  Follow Up Plan: Telephone follow up  appointment with care management team member scheduled for:  11/08/22 at 28 am       CCM (HYPERTENSION) EXPECTED OUTCOME: MONITOR, SELF-MANAGE AND REDUCE SYMPTOMS OF HYPERTENSION       Current Barriers:  Knowledge Deficits related to Hypertension management Chronic Disease Management support and education needs related to Hypertension, diet No Advanced Directives in place- already has information at home Patient reports lives alone, is independent in all aspects of her care, has adult daughter she can call of if needed,  has not been exercising as much at [facility] [employer]. Patient reports she tries to check blood pressure at least weekly, states readings have been "good" Patient states she has prefilled medication packs and this works very well for her Patient reports she has finished outpatient OT for right shoulder issues and states " this has really helped", pt reports she is using bands and continuing with exercises Patient reports she continues outpatient group therapy at [facility] for mental health concerns and feels this is helping, pt had LCSW services in the past and does not feel this is needed at present Patient reports she has been helping baby sit her grandchildren  Planned Interventions: Evaluation of current treatment plan related to hypertension self management and patient's adherence to plan as established by provider;   Reviewed prescribed diet low sodium Reviewed medications with patient and discussed importance of compliance;  Counseled on the importance of exercise goals with target of 150 minutes per week Advised patient, providing education and rationale, to monitor blood pressure daily and record, calling PCP for findings outside established parameters;  Discussed complications of poorly controlled blood pressure such as heart disease, stroke, circulatory complications, vision complications, kidney impairment, sexual dysfunction;  Pain assessment completed Reinforced  importance of continuing to exercise and using bands  Symptom Management: Take medications as prescribed   Attend all scheduled provider appointments Call pharmacy for medication refills 3-7 days in advance of running out of medications Attend church or other social activities Perform all self care activities independently  Perform IADL's (shopping, preparing meals, housekeeping, managing finances) independently Call provider office for new concerns or questions  check blood pressure 3 times per week choose a place to take my blood pressure (home, clinic or office, retail store) write blood pressure results in a log or diary learn about high blood pressure keep a blood pressure log take blood pressure log to all doctor appointments call doctor for signs and symptoms of high blood pressure keep all doctor appointments take medications for blood pressure exactly as prescribed report new symptoms to your doctor eat more whole grains, fruits and vegetables, lean meats and healthy fats Follow low sodium diet Continue to try and exercise at [facility] Try to limit sugary soft drinks, choose unsweetened tea, water Continue at [facility] for mental health counseling Continue using your bands at home  Follow Up Plan: Telephone follow up appointment with care management team member scheduled for:  11/08/22 at 1045 am          Plan:Telephone follow up appointment with care management team member scheduled for:  11/08/22 at [place] am  Jacqlyn Larsen [facility], BSN RN Case Manager [facility] [phone]

## 2022-09-22 DIAGNOSIS — E1159 Type 2 diabetes mellitus with other circulatory complications: Secondary | ICD-10-CM

## 2022-09-22 DIAGNOSIS — I10 Essential (primary) hypertension: Secondary | ICD-10-CM

## 2022-09-25 ENCOUNTER — Other Ambulatory Visit: Payer: Self-pay | Admitting: Nurse Practitioner

## 2022-09-25 DIAGNOSIS — E119 Type 2 diabetes mellitus without complications: Secondary | ICD-10-CM

## 2022-10-27 DIAGNOSIS — E118 Type 2 diabetes mellitus with unspecified complications: Secondary | ICD-10-CM | POA: Diagnosis not present

## 2022-10-29 DIAGNOSIS — E118 Type 2 diabetes mellitus with unspecified complications: Secondary | ICD-10-CM | POA: Diagnosis not present

## 2022-11-08 ENCOUNTER — Ambulatory Visit (INDEPENDENT_AMBULATORY_CARE_PROVIDER_SITE_OTHER): Payer: 59 | Admitting: *Deleted

## 2022-11-08 DIAGNOSIS — I10 Essential (primary) hypertension: Secondary | ICD-10-CM

## 2022-11-08 DIAGNOSIS — E119 Type 2 diabetes mellitus without complications: Secondary | ICD-10-CM

## 2022-11-08 NOTE — Chronic Care Management (AMB) (Signed)
Chronic Care Management   CCM RN Visit Note  11/08/2022 Name: Gwendolyn Woodward MRN: [id number] DOB: September 12, 1965  Subjective: Gwendolyn Woodward is a 57 y.o. year old female who is a primary care patient of Alvira Monday, [place]. The patient was referred to the Chronic Care Management team for assistance with care management needs subsequent to provider initiation of CCM services and plan of care.    Today's Visit:  Engaged with patient by telephone for follow up visit.        Goals Addressed             This Visit's Progress    CCM (DIABETES) EXPECTED OUTCOME:  MONITOR, SELF-MANAGE AND REDUCE SYMPTOMS OF DIABETES       Current Barriers:  Knowledge Deficits related to Diabetes management Chronic Disease Management support and education needs related to Diabetes, diet Patient reports checks CBG once daily fasting with readings usually 113-150 range, AIC on 08/24/22 6.6 Patient reports she does not always adhere to special diet, sometimes drinks sugary soft drinks and eats crackers, reports she is trying to drink mostly water Patient reports she has had 2 falls this past year Patient reports she is frustrated that she cannot seem to lose any weight, plans to talk with primary care provider at 11/12/22 visit about options Patient reports [facility] and plans to try water aerobics at [facility]  Planned Interventions: Reviewed medications with patient and discussed importance of medication adherence;        Counseled on importance of regular laboratory monitoring as prescribed;        Discussed plans with patient for ongoing care management follow up and provided patient with direct contact information for care management team;      Advised patient, providing education and rationale, to check cbg once daily and record        call provider for findings outside established parameters;       Review of patient status, including review of consultants reports, relevant laboratory and  other test results, and medications completed;       Advised patient to discuss any issues with blood sugar, medications with provider;      Reviewed importance of exercise/ walking and water aerobics (pt interested in) for management of Diabetes Reviewed safety precautions Reviewed instructions given by primary care provider- drink at least 64 ounce of fluid daily Reviewed importance of taking Vitamin D and all medications as prescribed Reinforced carbohydrate modified diet  Symptom Management: Take medications as prescribed   Attend all scheduled provider appointments Call pharmacy for medication refills 3-7 days in advance of running out of medications Attend church or other social activities Perform all self care activities independently  Perform IADL's (shopping, preparing meals, housekeeping, managing finances) independently Call provider office for new concerns or questions  check blood sugar at prescribed times: once daily check feet daily for cuts, sores or redness enter blood sugar readings and medication or insulin into daily log take the blood sugar log to all doctor visits take the blood sugar meter to all doctor visits trim toenails straight across drink 6 to 8 glasses of water each day eat fish at least once per week fill half of plate with vegetables limit fast food meals to no more than 1 per week manage portion size prepare main meal at home 3 to 5 days each week read food labels for fat, fiber, carbohydrates and portion size wash and dry feet carefully every day wear comfortable, cotton  socks wear comfortable, well-fitting shoes Check into water aerobics at [facility] Take vitamin D as prescribed Drink at least 64 ounces of fluid daily fall prevention strategies: change position slowly, use assistive device such as walker or cane (per provider recommendations) when walking, keep walkways clear, have good lighting in room. It is important to contact your provider if you  have any falls, maintain muscle strength/tone by exercise per provider recommendations.  Follow Up Plan: Telephone follow up appointment with care management team member scheduled for:  02/07/23 at 215 pm       CCM (HYPERTENSION) EXPECTED OUTCOME: MONITOR, SELF-MANAGE AND REDUCE SYMPTOMS OF HYPERTENSION       Current Barriers:  Knowledge Deficits related to Hypertension management Chronic Disease Management support and education needs related to Hypertension, diet No Advanced Directives in place- already has information at home Patient reports lives alone, is independent in all aspects of her care, has adult daughter she can call of if needed,  has been exercising as much at [facility]. Patient reports she tries to check blood pressure at least weekly, states readings "usually good, on occasion get a high reading" Patient states she has prefilled medication packs and this works very well for her Patient reports she has finished outpatient OT for right shoulder issues and states " this has really helped", pt reports she is using bands and continuing with exercises Patient reports she has not been to outpatient group therapy at [facility] recently for mental health concerns, feels this is very helpful, states she will call today and make appointment, pt had LCSW services in the past and does not feel this is needed at present Patient reports she has been helping baby sit her grandchildren  Planned Interventions: Evaluation of current treatment plan related to hypertension self management and patient's adherence to plan as established by provider;   Reviewed medications with patient and discussed importance of compliance;  Counseled on the importance of exercise goals with target of 150 minutes per week Advised patient, providing education and rationale, to monitor blood pressure daily and record, calling PCP for findings outside established parameters;  Advised patient to discuss any issues with  blood pressure, medications with provider; Discussed complications of poorly controlled blood pressure such as heart disease, stroke, circulatory complications, vision complications, kidney impairment, sexual dysfunction;  Pain assessment completed Reinforced low sodium diet Reviewed importance of continuing to exercise and using bands Encouraged pt to call today and make follow up appointment with [facility]  Symptom Management: Take medications as prescribed   Attend all scheduled provider appointments Call pharmacy for medication refills 3-7 days in advance of running out of medications Attend church or other social activities Perform all self care activities independently  Perform IADL's (shopping, preparing meals, housekeeping, managing finances) independently Call provider office for new concerns or questions  check blood pressure 3 times per week choose a place to take my blood pressure (home, clinic or office, retail store) write blood pressure results in a log or diary learn about high blood pressure keep a blood pressure log take blood pressure log to all doctor appointments call doctor for signs and symptoms of high blood pressure keep all doctor appointments take medications for blood pressure exactly as prescribed report new symptoms to your doctor eat more whole grains, fruits and vegetables, lean meats and healthy fats Follow low sodium diet- read food labels for sodium content Continue exercising at [facility] Try to limit sugary soft drinks, choose unsweetened tea, water Continue at [facility]  for mental health counseling- call today and make follow up appointment Continue using your bands at home  Follow Up Plan: Telephone follow up appointment with care management team member scheduled for:  02/07/23 at 215 pm          Plan:Telephone follow up appointment with care management team member scheduled for:  02/07/23 at 215 pm  Jacqlyn Larsen [facility], BSN RN Case  Manager [facility] [phone]

## 2022-11-08 NOTE — Patient Instructions (Signed)
Please call the care guide team at [phone] if you need to cancel or reschedule your appointment.   If you are experiencing a Mental Health or [place] or need someone to talk to, please call the Suicide and Crisis Lifeline: 988 call the [place] National Suicide Prevention Lifeline: 215-596-1579 or TTY: 8483852170 TTY 724-534-1071) to talk to a trained counselor call 1-800-273-TALK (toll free, 24 hour hotline) go to [facility] [address], [place] [phone]) call the [facility]: [phone] call 911   Following is a copy of the CCM Program Consent:  CCM service includes personalized support from designated clinical staff supervised by the physician, including individualized plan of care and coordination with other care providers 24/7 contact phone numbers for assistance for urgent and routine care needs. Service will only be billed when office clinical staff spend 20 minutes or more in a month to coordinate care. Only one practitioner may furnish and bill the service in a calendar month. The patient may stop CCM services at amy time (effective at the end of the month) by phone call to the office staff. The patient will be responsible for cost sharing (co-pay) or up to 20% of the service fee (after annual deductible is met)  Following is a copy of your full provider care plan:   Goals Addressed             This Visit's Progress    CCM (DIABETES) EXPECTED OUTCOME:  MONITOR, SELF-MANAGE AND REDUCE SYMPTOMS OF DIABETES       Current Barriers:  Knowledge Deficits related to Diabetes management Chronic Disease Management support and education needs related to Diabetes, diet Patient reports checks CBG once daily fasting with readings usually 113-150 range, AIC on 08/24/22 6.6 Patient reports she does not always adhere to special diet, sometimes drinks sugary soft drinks and eats crackers, reports she is  trying to drink mostly water Patient reports she has had 2 falls this past year Patient reports she is frustrated that she cannot seem to lose any weight, plans to talk with primary care provider at 11/12/22 visit about options Patient reports she exercises at senior center and plans to try water aerobics at [facility]  Planned Interventions: Reviewed medications with patient and discussed importance of medication adherence;        Counseled on importance of regular laboratory monitoring as prescribed;        Discussed plans with patient for ongoing care management follow up and provided patient with direct contact information for care management team;      Advised patient, providing education and rationale, to check cbg once daily and record        call provider for findings outside established parameters;       Review of patient status, including review of consultants reports, relevant laboratory and other test results, and medications completed;       Advised patient to discuss any issues with blood sugar, medications with provider;      Reviewed importance of exercise/ walking and water aerobics (pt interested in) for management of Diabetes Reviewed safety precautions Reviewed instructions given by primary care provider- drink at least 64 ounce of fluid daily Reviewed importance of taking Vitamin D and all medications as prescribed Reinforced carbohydrate modified diet  Symptom Management: Take medications as prescribed   Attend all scheduled provider appointments Call pharmacy for medication refills 3-7 days in advance of running out of medications Attend church or other social activities  Perform all self care activities independently  Perform IADL's (shopping, preparing meals, housekeeping, managing finances) independently Call provider office for new concerns or questions  check blood sugar at prescribed times: once daily check feet daily for cuts, sores or redness enter blood sugar  readings and medication or insulin into daily log take the blood sugar log to all doctor visits take the blood sugar meter to all doctor visits trim toenails straight across drink 6 to 8 glasses of water each day eat fish at least once per week fill half of plate with vegetables limit fast food meals to no more than 1 per week manage portion size prepare main meal at home 3 to 5 days each week read food labels for fat, fiber, carbohydrates and portion size wash and dry feet carefully every day wear comfortable, cotton socks wear comfortable, well-fitting shoes Check into water aerobics at [facility] Take vitamin D as prescribed Drink at least 64 ounces of fluid daily fall prevention strategies: change position slowly, use assistive device such as walker or cane (per provider recommendations) when walking, keep walkways clear, have good lighting in room. It is important to contact your provider if you have any falls, maintain muscle strength/tone by exercise per provider recommendations.  Follow Up Plan: Telephone follow up appointment with care management team member scheduled for:  02/07/23 at 215 pm       CCM (HYPERTENSION) EXPECTED OUTCOME: MONITOR, SELF-MANAGE AND REDUCE SYMPTOMS OF HYPERTENSION       Current Barriers:  Knowledge Deficits related to Hypertension management Chronic Disease Management support and education needs related to Hypertension, diet No Advanced Directives in place- already has information at home Patient reports lives alone, is independent in all aspects of her care, has adult daughter she can call of if needed,  has been exercising as much at [facility]. Patient reports she tries to check blood pressure at least weekly, states readings "usually good, on occasion get a high reading" Patient states she has prefilled medication packs and this works very well for her Patient reports she has finished outpatient OT for right shoulder issues and states " this has  really helped", pt reports she is using bands and continuing with exercises Patient reports she has not been to outpatient group therapy at [facility] recently for mental health concerns, feels this is very helpful, states she will call today and make appointment, pt had LCSW services in the past and does not feel this is needed at present Patient reports she has been helping baby sit her grandchildren  Planned Interventions: Evaluation of current treatment plan related to hypertension self management and patient's adherence to plan as established by provider;   Reviewed medications with patient and discussed importance of compliance;  Counseled on the importance of exercise goals with target of 150 minutes per week Advised patient, providing education and rationale, to monitor blood pressure daily and record, calling PCP for findings outside established parameters;  Advised patient to discuss any issues with blood pressure, medications with provider; Discussed complications of poorly controlled blood pressure such as heart disease, stroke, circulatory complications, vision complications, kidney impairment, sexual dysfunction;  Pain assessment completed Reinforced low sodium diet Reviewed importance of continuing to exercise and using bands Encouraged pt to call today and make follow up appointment with [facility]  Symptom Management: Take medications as prescribed   Attend all scheduled provider appointments Call pharmacy for medication refills 3-7 days in advance of running out of medications Attend church or other social activities  Perform all self care activities independently  Perform IADL's (shopping, preparing meals, housekeeping, managing finances) independently Call provider office for new concerns or questions  check blood pressure 3 times per week choose a place to take my blood pressure (home, clinic or office, retail store) write blood pressure results in a log or diary learn about  high blood pressure keep a blood pressure log take blood pressure log to all doctor appointments call doctor for signs and symptoms of high blood pressure keep all doctor appointments take medications for blood pressure exactly as prescribed report new symptoms to your doctor eat more whole grains, fruits and vegetables, lean meats and healthy fats Follow low sodium diet- read food labels for sodium content Continue exercising at [facility] Try to limit sugary soft drinks, choose unsweetened tea, water Continue at [facility] for mental health counseling- call today and make follow up appointment Continue using your bands at home  Follow Up Plan: Telephone follow up appointment with care management team member scheduled for:  02/07/23 at 215 pm          The patient verbalized understanding of instructions, educational materials, and care plan provided today and DECLINED offer to receive copy of patient instructions, educational materials, and care plan.   Telephone follow up appointment with care management team member scheduled for:  02/07/23 at 215 pm

## 2022-11-11 ENCOUNTER — Ambulatory Visit (INDEPENDENT_AMBULATORY_CARE_PROVIDER_SITE_OTHER): Payer: 59 | Admitting: Family Medicine

## 2022-11-11 ENCOUNTER — Encounter: Payer: Self-pay | Admitting: Family Medicine

## 2022-11-11 VITALS — BP 142/82 | HR 90 | Ht 60.5 in | Wt 262.0 lb

## 2022-11-11 DIAGNOSIS — I10 Essential (primary) hypertension: Secondary | ICD-10-CM

## 2022-11-11 DIAGNOSIS — F3289 Other specified depressive episodes: Secondary | ICD-10-CM

## 2022-11-11 DIAGNOSIS — B349 Viral infection, unspecified: Secondary | ICD-10-CM | POA: Diagnosis not present

## 2022-11-11 DIAGNOSIS — E7849 Other hyperlipidemia: Secondary | ICD-10-CM | POA: Diagnosis not present

## 2022-11-11 MED ORDER — PSEUDOEPH-BROMPHEN-DM 30-2-10 MG/5ML PO SYRP: 5.0000 mL | ORAL_SOLUTION | Freq: Four times a day (QID) | ORAL | 0 refills | Status: DC | PRN

## 2022-11-11 NOTE — Assessment & Plan Note (Signed)
Complains of sore throat, cough, body aches and headaches, and watery eyes for 3 days Denies fever and chills Denies facial pressure and pain She is unsure of any sick contacts With suspicion today for COVID We will treat with brompheniramine-pseudoephedrine-DM Take medication as prescribed. Increase fluids and allow for plenty of rest. Recommend Tylenol  as needed for pain, fever, or general discomfort. Warm salt water gargles 3-4 times daily to help with throat pain or discomfort. Recommend using a humidifier at bedtime during sleep to help with cough and nasal congestion. Follow-up if your symptoms do not improve

## 2022-11-11 NOTE — Progress Notes (Signed)
Established Patient Office Visit  Subjective:  Patient ID: Gwendolyn Woodward, female    DOB: [birth year]  Age: 57 y.o. MRN: [id number]  CC:  Chief Complaint  Patient presents with   Follow-up    3 month f/u   URI    Pt reports sore throat, having phlegm, watery eyes since 3 days ago. (11/08/22)    HPI Gwendolyn Woodward is a 57 y.o. female with past medical history of hyperlipidemia, depression, hypertension presents for f/u of  chronic medical conditions. For the details of today's visit, please refer to the assessment and plan.     Past Medical History:  Diagnosis Date   Arthritis    back pain and knee pain, no meds   Asthma    Chest pain    + dyspnea   CVA (cerebral infarction) 1984 , 1988   x2, left sided weakness, history of dizziness   Depression with anxiety    History- no meds   Dysphagia    Fasting hyperglycemia    Gastroesophageal reflux disease    no meds   Headache(784.0)    OTC med PRN   History of anemia    S/p transfusions in 1999 at [facility] after vaginal bleeding; normal CBC and 09/2011   Hyperlipidemia    lipid profile in 11/2011: 203, 106, 58, 124   Hypertension    Lab 09/2011: Normal CMet except glucose of 109-169 and albumin-3.4; normal BP 09/2011-12/2011   Sleep apnea    Does not use CPAP - no longer has CPAP machine   Stroke [facility])     Past Surgical History:  Procedure Laterality Date   [place]   x 2   COLONOSCOPY  01/27/2012   Procedure: COLONOSCOPY;  Surgeon: Danie Binder, MD;  [facility];  Service: Endoscopy;  Laterality: N/A;  8:30   ENDOMETRIAL ABLATION  Feb 2013   ESOPHAGOGASTRODUODENOSCOPY  5/11   [facility]-Dr Iva Lento GERD distal esophagus, NEGATIVE bx for Barretts   EXTERNAL FIXATION LEG Right 04/19/2020   Procedure: EXTERNAL FIXATION RIGHT ANKLE;  Surgeon: Mcarthur Rossetti, MD;  Location: [place];  Service: Orthopedics;  Laterality: Right;   IUD REMOVAL  10/20/2011   Procedure:  INTRAUTERINE DEVICE (IUD) REMOVAL;  Surgeon: Allena Katz, MD;  Location: [place] [facility];  Service: Gynecology;  Laterality: N/A;   OPEN REDUCTION INTERNAL FIXATION (ORIF) TIBIA/FIBULA FRACTURE Right 04/23/2020   Procedure: OPEN REDUCTION INTERNAL FIXATION (ORIF) TIBIA/FIBULA FRACTURE;  Surgeon: Shona Needles, MD;  Location: [place];  Service: Orthopedics;  Laterality: Right;   TUBAL LIGATION     WISDOM TOOTH EXTRACTION      Family History  Problem Relation Age of Onset   Cancer Father        Lung/Throat   Heart disease Father    Hypertension Mother    Asthma Mother    Diabetes Maternal Uncle    Anesthesia problems Neg Hx     Social History   Socioeconomic History   Marital status: Divorced    Spouse name: Not on file   Number of children: 2   Years of education: Not on file   Highest education level: Not on file  Occupational History   Occupation: sock boarder    Comment: just got new job in [place] Use   Smoking status: Never   Smokeless tobacco: Never  Vaping Use   Vaping Use: Never used  Substance and Sexual Activity   Alcohol  use: Not Currently    Comment: Socially, 2 times per yr   Drug use: No   Sexual activity: Yes    Birth control/protection: Surgical    Comment: tubal & ablation  Other Topics Concern   Not on file  Social History Narrative   Lives alone-2 grown children   Social Determinants of Health   Financial Resource Strain: Low Risk  (07/13/2022)   Overall Financial Resource Strain (CARDIA)    Difficulty of Paying Living Expenses: Not hard at all  Food Insecurity: No Food Insecurity (07/13/2022)   Hunger Vital Sign    Worried About Running Out of Food in the Last Year: Never true    Ran Out of Food in the Last Year: Never true  Transportation Needs: No Transportation Needs (07/13/2022)   PRAPARE - Hydrologist (Medical): No    Lack of Transportation (Non-Medical): No  Physical Activity: Insufficiently  Active (07/13/2022)   Exercise Vital Sign    Days of Exercise per Week: 3 days    Minutes of Exercise per Session: 30 min  Stress: No Stress Concern Present (07/13/2022)   [place]    Feeling of Stress : Only a little  Recent Concern: Stress - Stress Concern Present (04/16/2022)   [place]    Feeling of Stress : To some extent  Social Connections: Moderately Integrated (07/13/2022)   Social Connection and Isolation Panel [NHANES]    Frequency of Communication with Friends and Family: Once a week    Frequency of Social Gatherings with Friends and Family: Three times a week    Attends Religious Services: More than 4 times per year    Active Member of Clubs or Organizations: Yes    Attends Music therapist: More than 4 times per year    Marital Status: Divorced  Human resources officer Violence: Not on file    Outpatient Medications Prior to Visit  Medication Sig Dispense Refill   ACCU-CHEK GUIDE test strip      Accu-Chek Softclix Lancets lancets Once daily testing dx e11.9 50 each 1   atorvastatin (LIPITOR) 40 MG tablet Take 1 tablet (40 mg total) by mouth at bedtime. 30 tablet 11   Cholecalciferol (VITAMIN D3) 25 MCG (1000 UT) CAPS Take 1 capsule (1,000 Units total) by mouth daily. 30 capsule 3   Cyanocobalamin (VITAMIN B12 PO) Take by mouth. Once daily     cyclobenzaprine (FLEXERIL) 10 MG tablet Take 1 tablet (10 mg total) by mouth 2 (two) times daily as needed for muscle spasms. 20 tablet 0   escitalopram (LEXAPRO) 10 MG tablet TAKE ONE TABLET BY MOUTH ONCE DAILY. 30 tablet 0   gabapentin (NEURONTIN) 300 MG capsule TAKE ONE CAPSULE BY MOUTH ONCE DAILY. 30 capsule 0   metFORMIN (GLUCOPHAGE-XR) 500 MG 24 hr tablet Take 2 tablets (1,000 mg total) by mouth 2 (two) times daily with a meal. 180 tablet 1   Olmesartan-amLODIPine-HCTZ 40-10-25 MG TABS  Take 1 tablet by mouth daily. 30 tablet 3   oxybutynin (DITROPAN XL) 10 MG 24 hr tablet Take 1 tablet (10 mg total) by mouth daily. 30 tablet 12   spironolactone (ALDACTONE) 25 MG tablet Take 12.5 mg by mouth daily.     No facility-administered medications prior to visit.    Allergies  Allergen Reactions   Aspirin     INTERNAL BLEEDING   Tramadol Itching  ROS Review of Systems  Constitutional:  Negative for chills and fever.  HENT:  Positive for rhinorrhea and sore throat.   Eyes:  Negative for visual disturbance.  Respiratory:  Positive for cough. Negative for chest tightness and shortness of breath.   Neurological:  Positive for headaches. Negative for dizziness.      Objective:    Physical Exam HENT:     Head: Normocephalic.     Nose:     Right Sinus: No maxillary sinus tenderness or frontal sinus tenderness.     Left Sinus: No maxillary sinus tenderness or frontal sinus tenderness.     Mouth/Throat:     Mouth: Mucous membranes are moist.     Dentition: No dental tenderness.     Tongue: No lesions.     Palate: No mass and lesions.     Pharynx: Uvula midline.  Cardiovascular:     Rate and Rhythm: Normal rate.     Heart sounds: Normal heart sounds.  Pulmonary:     Effort: Pulmonary effort is normal.     Breath sounds: Normal breath sounds.  Neurological:     Mental Status: She is alert.     BP (!) 142/82 (BP Location: Left Arm)   Pulse 90   Ht 5' 0.5" (1.537 m)   Wt 262 lb 0.6 oz (118.9 kg)   SpO2 93%   BMI 50.33 kg/m  Wt Readings from Last 3 Encounters:  11/11/22 262 lb 0.6 oz (118.9 kg)  08/13/22 263 lb (119.3 kg)  07/06/22 264 lb (119.7 kg)    Lab Results  Component Value Date   TSH 0.611 08/24/2022   Lab Results  Component Value Date   WBC 6.3 08/24/2022   HGB 11.3 08/24/2022   HCT 36.2 08/24/2022   MCV 79 08/24/2022   PLT 191 08/24/2022   Lab Results  Component Value Date   NA 142 08/24/2022   K 4.0 08/24/2022   CO2 24 08/24/2022    GLUCOSE 156 (H) 08/24/2022   BUN 16 08/24/2022   CREATININE 1.10 (H) 08/24/2022   BILITOT <0.2 08/24/2022   ALKPHOS 124 (H) 08/24/2022   AST 10 08/24/2022   ALT 15 08/24/2022   PROT 6.3 08/24/2022   ALBUMIN 3.7 (L) 08/24/2022   CALCIUM 9.3 08/24/2022   ANIONGAP 9 04/25/2020   EGFR 58 (L) 08/24/2022   Lab Results  Component Value Date   CHOL 170 08/24/2022   Lab Results  Component Value Date   HDL 61 08/24/2022   Lab Results  Component Value Date   LDLCALC 93 08/24/2022   Lab Results  Component Value Date   TRIG 85 08/24/2022   Lab Results  Component Value Date   CHOLHDL 2.8 08/24/2022   Lab Results  Component Value Date   HGBA1C 6.6 (H) 08/24/2022      Assessment & Plan:  Viral illness Assessment & Plan: Complains of sore throat, cough, body aches and headaches, and watery eyes for 3 days Denies fever and chills Denies facial pressure and pain She is unsure of any sick contacts With suspicion today for COVID We will treat with brompheniramine-pseudoephedrine-DM Take medication as prescribed. Increase fluids and allow for plenty of rest. Recommend Tylenol  as needed for pain, fever, or general discomfort. Warm salt water gargles 3-4 times daily to help with throat pain or discomfort. Recommend using a humidifier at bedtime during sleep to help with cough and nasal congestion. Follow-up if your symptoms do not improve    Orders: -  Pseudoeph-Bromphen-DM; Take 5 mLs by mouth 4 (four) times daily as needed.  Dispense: 120 mL; Refill: 0 -     COVID-19, Flu A+B and RSV  Primary hypertension Assessment & Plan: Uncontrolled  Likely due to the patient's viral symptoms She reports compliance with treatment regimen She takes olmesartan-amlodipine-hydrochlorothiazide 40-10-25 Denies chest pain, palpitation, shortness of breath, and visual disturbances No changes made to the treatment regimen currently Encouraged to continue therapy Will evaluate in 1  month BP Readings from Last 3 Encounters:  11/11/22 (!) 142/82  08/13/22 122/78  06/08/22 112/78        Other hyperlipidemia Assessment & Plan: She takes atorvastatin 40 mg daily Denies muscle aches and pain Encouraged to continue treatment regimen Lab Results  Component Value Date   CHOL 170 08/24/2022   HDL 61 08/24/2022   LDLCALC 93 08/24/2022   TRIG 85 08/24/2022   CHOLHDL 2.8 08/24/2022      Other depression Assessment & Plan: Stable on Lexapro 10 mg daily Denies suicidal thoughts and ideation Encouraged to continue treatment regimen     Follow-up: Return in about 1 month (around 12/12/2022) for BP, pap smear.   Alvira Monday, FNP

## 2022-11-11 NOTE — Assessment & Plan Note (Signed)
Stable on Lexapro 10 mg daily Denies suicidal thoughts and ideation Encouraged to continue treatment regimen 

## 2022-11-11 NOTE — Patient Instructions (Addendum)
  I appreciate the opportunity to provide care to you today!    Follow up:  1 month for pap smear  Labs: next visit  Please pick up your medications at the pharmacy  Please schedule medicare annual wellness   Please go to your local pharmacy for tdap and shingrix vaccines.   Please continue to a heart-healthy diet and increase your physical activities. Try to exercise for 10mins at least five days a week.      It was a pleasure to see you and I look forward to continuing to work together on your health and well-being. Please do not hesitate to call the office if you need care or have questions about your care.   Have a wonderful day and week. With Gratitude, Alvira Monday MSN, FNP-BC

## 2022-11-11 NOTE — Assessment & Plan Note (Signed)
She takes atorvastatin 40 mg daily Denies muscle aches and pain Encouraged to continue treatment regimen Lab Results  Component Value Date   CHOL 170 08/24/2022   HDL 61 08/24/2022   LDLCALC 93 08/24/2022   TRIG 85 08/24/2022   CHOLHDL 2.8 08/24/2022

## 2022-11-11 NOTE — Assessment & Plan Note (Addendum)
Uncontrolled  Likely due to the patient's viral symptoms She reports compliance with treatment regimen She takes olmesartan-amlodipine-hydrochlorothiazide 40-10-25 Denies chest pain, palpitation, shortness of breath, and visual disturbances No changes made to the treatment regimen currently Encouraged to continue therapy Will evaluate in 1 month BP Readings from Last 3 Encounters:  11/11/22 (!) 142/82  08/13/22 122/78  06/08/22 112/78

## 2022-11-12 ENCOUNTER — Ambulatory Visit: Payer: 59 | Admitting: Family Medicine

## 2022-11-13 DIAGNOSIS — E118 Type 2 diabetes mellitus with unspecified complications: Secondary | ICD-10-CM | POA: Diagnosis not present

## 2022-11-13 LAB — COVID-19, FLU A+B AND RSV
Influenza A, NAA: NOT DETECTED
Influenza B, NAA: NOT DETECTED
RSV, NAA: NOT DETECTED
SARS-CoV-2, NAA: NOT DETECTED

## 2022-11-15 NOTE — Progress Notes (Signed)
Please inform the patient that she tested negative for COVID and flu and RSV.  I recommend symptomatic management of her symptoms

## 2022-11-16 ENCOUNTER — Other Ambulatory Visit: Payer: Self-pay | Admitting: Family Medicine

## 2022-11-16 DIAGNOSIS — J029 Acute pharyngitis, unspecified: Secondary | ICD-10-CM

## 2022-11-16 MED ORDER — LIDOCAINE VISCOUS HCL 2 % MT SOLN: 15.0000 mL | OROMUCOSAL | 0 refills | Status: DC | PRN

## 2022-11-16 NOTE — Progress Notes (Signed)
A prescription for lidocaine viscous was sent to her pharmacy.  Encourage the patient to gargle with 15 mL of lidocaine viscous 2% solution.  The solution can be swallowed or spit out no more than every 3 hours. Please encourage the patient to continue taking Tylenol as needed for chest discomfort, likely due to excessive coughing.

## 2022-11-21 DIAGNOSIS — E119 Type 2 diabetes mellitus without complications: Secondary | ICD-10-CM

## 2022-11-21 DIAGNOSIS — I10 Essential (primary) hypertension: Secondary | ICD-10-CM

## 2022-11-29 NOTE — Progress Notes (Unsigned)
This encounter was created in error - please disregard.

## 2022-12-01 DIAGNOSIS — E118 Type 2 diabetes mellitus with unspecified complications: Secondary | ICD-10-CM | POA: Diagnosis not present

## 2022-12-13 DIAGNOSIS — E118 Type 2 diabetes mellitus with unspecified complications: Secondary | ICD-10-CM | POA: Diagnosis not present

## 2022-12-15 ENCOUNTER — Encounter: Payer: Self-pay | Admitting: Family Medicine

## 2022-12-15 ENCOUNTER — Ambulatory Visit (INDEPENDENT_AMBULATORY_CARE_PROVIDER_SITE_OTHER): Payer: 59 | Admitting: Family Medicine

## 2022-12-15 ENCOUNTER — Other Ambulatory Visit (HOSPITAL_COMMUNITY)
Admission: RE | Admit: 2022-12-15 | Discharge: 2022-12-15 | Disposition: A | Discharge status: Home / Self Care | Payer: 59 | Source: Ambulatory Visit | Attending: Family Medicine | Admitting: Family Medicine

## 2022-12-15 VITALS — BP 142/90 | HR 87 | Ht 60.0 in | Wt 260.1 lb

## 2022-12-15 DIAGNOSIS — I10 Essential (primary) hypertension: Secondary | ICD-10-CM | POA: Diagnosis not present

## 2022-12-15 DIAGNOSIS — E119 Type 2 diabetes mellitus without complications: Secondary | ICD-10-CM | POA: Diagnosis not present

## 2022-12-15 DIAGNOSIS — G8929 Other chronic pain: Secondary | ICD-10-CM

## 2022-12-15 DIAGNOSIS — Z1151 Encounter for screening for human papillomavirus (HPV): Secondary | ICD-10-CM | POA: Insufficient documentation

## 2022-12-15 DIAGNOSIS — Z01419 Encounter for gynecological examination (general) (routine) without abnormal findings: Secondary | ICD-10-CM | POA: Insufficient documentation

## 2022-12-15 DIAGNOSIS — Z124 Encounter for screening for malignant neoplasm of cervix: Secondary | ICD-10-CM

## 2022-12-15 DIAGNOSIS — M25511 Pain in right shoulder: Secondary | ICD-10-CM | POA: Diagnosis not present

## 2022-12-15 DIAGNOSIS — E1165 Type 2 diabetes mellitus with hyperglycemia: Secondary | ICD-10-CM | POA: Diagnosis not present

## 2022-12-15 DIAGNOSIS — F339 Major depressive disorder, recurrent, unspecified: Secondary | ICD-10-CM

## 2022-12-15 MED ORDER — METFORMIN HCL ER 500 MG PO TB24: 1000.0000 mg | ORAL_TABLET | Freq: Two times a day (BID) | ORAL | 1 refills | Status: DC

## 2022-12-15 MED ORDER — GABAPENTIN 300 MG PO CAPS: 300.0000 mg | ORAL_CAPSULE | Freq: Every day | ORAL | 0 refills | Status: DC

## 2022-12-15 MED ORDER — OLMESARTAN-AMLODIPINE-HCTZ 40-10-25 MG PO TABS: 1.0000 | ORAL_TABLET | Freq: Every day | ORAL | 3 refills | Status: DC

## 2022-12-15 MED ORDER — OZEMPIC (0.25 OR 0.5 MG/DOSE) 2 MG/3ML ~~LOC~~ SOPN
0.2500 mg | PEN_INJECTOR | SUBCUTANEOUS | 0 refills | Status: DC
Start: 2022-12-15 — End: 2023-01-21

## 2022-12-15 MED ORDER — ESCITALOPRAM OXALATE 10 MG PO TABS: 10.0000 mg | ORAL_TABLET | Freq: Every day | ORAL | 0 refills | Status: DC

## 2022-12-15 NOTE — Progress Notes (Signed)
ozemp  Established Patient Office Visit  Subjective:  Patient ID: Gwendolyn Woodward, female    DOB: 02/12/1966  Age: 57 y.o. MRN: [id number]  CC:  Chief Complaint  Patient presents with   Gynecologic Exam    Pap smear and breast exam.    Follow-up    1 month f/u, pt would like to discuss weight loss medication options.    HPI Gwendolyn Woodward is a 57 y.o. female with past medical history of type 2 diabetes presents for Pap smear examination. For the details of today's visit, please refer to the assessment and plan.     Past Medical History:  Diagnosis Date   Arthritis    back pain and knee pain, no meds   Asthma    Chest pain    + dyspnea   CVA (cerebral infarction) 1984 , 1988   x2, left sided weakness, history of dizziness   Depression with anxiety    History- no meds   Dysphagia    Fasting hyperglycemia    Gastroesophageal reflux disease    no meds   Headache(784.0)    OTC med PRN   History of anemia    S/p transfusions in 1999 at [facility] after vaginal bleeding; normal CBC and 09/2011   Hyperlipidemia    lipid profile in 11/2011: 203, 106, 58, 124   Hypertension    Lab 09/2011: Normal CMet except glucose of 109-169 and albumin-3.4; normal BP 09/2011-12/2011   Sleep apnea    Does not use CPAP - no longer has CPAP machine   Stroke     Past Surgical History:  Procedure Laterality Date   CESAREAN SECTION  1984, 1987   x 2   COLONOSCOPY  01/27/2012   Procedure: COLONOSCOPY;  Surgeon: West Bali, MD;  [facility];  Service: Endoscopy;  Laterality: N/A;  8:30   ENDOMETRIAL ABLATION  Feb 2013   ESOPHAGOGASTRODUODENOSCOPY  5/11   [facility]Dr Eliott Nine GERD distal esophagus, NEGATIVE bx for Barretts   EXTERNAL FIXATION LEG Right 04/19/2020   Procedure: EXTERNAL FIXATION RIGHT ANKLE;  Surgeon: Kathryne Hitch, MD;  Location: [facility] OR;  Service: Orthopedics;  Laterality: Right;   IUD REMOVAL  10/20/2011   Procedure: INTRAUTERINE DEVICE (IUD)  REMOVAL;  Surgeon: Leslie Andrea, MD;  Location: [facility] ORS;  Service: Gynecology;  Laterality: N/A;   OPEN REDUCTION INTERNAL FIXATION (ORIF) TIBIA/FIBULA FRACTURE Right 04/23/2020   Procedure: OPEN REDUCTION INTERNAL FIXATION (ORIF) TIBIA/FIBULA FRACTURE;  Surgeon: Roby Lofts, MD;  Location: [facility] OR;  Service: Orthopedics;  Laterality: Right;   TUBAL LIGATION     WISDOM TOOTH EXTRACTION      Family History  Problem Relation Age of Onset   Cancer Father        Lung/Throat   Heart disease Father    Hypertension Mother    Asthma Mother    Diabetes Maternal Uncle    Anesthesia problems Neg Hx     Social History   Socioeconomic History   Marital status: Divorced    Spouse name: Not on file   Number of children: 2   Years of education: Not on file   Highest education level: Not on file  Occupational History   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  Tobacco Use   Smoking status: Never   Smokeless tobacco: Never  Vaping Use   Vaping Use: Never used  Substance and Sexual Activity   Alcohol  use: Not Currently    Comment: Socially, 2 times per yr   Drug use: No   Sexual activity: Yes    Birth control/protection: Surgical    Comment: tubal & ablation  Other Topics Concern   Not on file  Social History Narrative   Lives alone-2 grown children   Social Determinants of Health   Financial Resource Strain: Low Risk  (07/13/2022)   Overall Financial Resource Strain (CARDIA)    Difficulty of Paying Living Expenses: Not hard at all  Food Insecurity: No Food Insecurity (07/13/2022)   Hunger Vital Sign    Worried About Running Out of Food in the Last Year: Never true    Ran Out of Food in the Last Year: Never true  Transportation Needs: No Transportation Needs (07/13/2022)   PRAPARE - Administrator, Civil Service (Medical): No    Lack of Transportation (Non-Medical): No  Physical Activity: Insufficiently Active (07/13/2022)    Exercise Vital Sign    Days of Exercise per Week: 3 days    Minutes of Exercise per Session: 30 min  Stress: No Stress Concern Present (07/13/2022)   [employer] of Occupational Health - Occupational Stress Questionnaire    Feeling of Stress : Only a little  Recent Concern: Stress - Stress Concern Present (04/16/2022)   [employer] of Occupational Health - Occupational Stress Questionnaire    Feeling of Stress : To some extent  Social Connections: Moderately Integrated (07/13/2022)   Social Connection and Isolation Panel [NHANES]    Frequency of Communication with Friends and Family: Once a week    Frequency of Social Gatherings with Friends and Family: Three times a week    Attends Religious Services: More than 4 times per year    Active Member of Clubs or Organizations: Yes    Attends Engineer, structural: More than 4 times per year    Marital Status: Divorced  Catering manager Violence: Not on file    Outpatient Medications Prior to Visit  Medication Sig Dispense Refill   ACCU-CHEK GUIDE test strip      Accu-Chek Softclix Lancets lancets Once daily testing dx e11.9 50 each 1   atorvastatin (LIPITOR) 40 MG tablet Take 1 tablet (40 mg total) by mouth at bedtime. 30 tablet 11   brompheniramine-pseudoephedrine-DM 30-2-10 MG/5ML syrup Take 5 mLs by mouth 4 (four) times daily as needed. 120 mL 0   Cholecalciferol (VITAMIN D3) 25 MCG (1000 UT) CAPS Take 1 capsule (1,000 Units total) by mouth daily. 30 capsule 3   Cyanocobalamin (VITAMIN B12 PO) Take by mouth. Once daily     cyclobenzaprine (FLEXERIL) 10 MG tablet Take 1 tablet (10 mg total) by mouth 2 (two) times daily as needed for muscle spasms. 20 tablet 0   lidocaine (XYLOCAINE) 2 % solution Use as directed 15 mLs in the mouth or throat as needed for mouth pain. 100 mL 0   oxybutynin (DITROPAN XL) 10 MG 24 hr tablet Take 1 tablet (10 mg total) by mouth daily. 30 tablet 12   spironolactone (ALDACTONE) 25 MG tablet  Take 12.5 mg by mouth daily.     escitalopram (LEXAPRO) 10 MG tablet TAKE ONE TABLET BY MOUTH ONCE DAILY. 30 tablet 0   gabapentin (NEURONTIN) 300 MG capsule TAKE ONE CAPSULE BY MOUTH ONCE DAILY. 30 capsule 0   metFORMIN (GLUCOPHAGE-XR) 500 MG 24 hr tablet Take 2 tablets (1,000 mg total) by mouth 2 (two) times daily with a meal. 180 tablet 1  Olmesartan-amLODIPine-HCTZ 40-10-25 MG TABS Take 1 tablet by mouth daily. 30 tablet 3   No facility-administered medications prior to visit.    Allergies  Allergen Reactions   Aspirin     INTERNAL BLEEDING   Tramadol Itching    ROS Review of Systems  Constitutional:  Negative for chills and fever.  Eyes:  Negative for visual disturbance.  Respiratory:  Negative for chest tightness and shortness of breath.   Genitourinary:  Negative for decreased urine volume, vaginal bleeding, vaginal discharge and vaginal pain.  Neurological:  Negative for dizziness and headaches.      Objective:    Physical Exam HENT:     Head: Normocephalic.     Mouth/Throat:     Mouth: Mucous membranes are moist.  Cardiovascular:     Rate and Rhythm: Normal rate.     Heart sounds: Normal heart sounds.  Pulmonary:     Effort: Pulmonary effort is normal.     Breath sounds: Normal breath sounds.  Chest:  Breasts:    Tanner Score is 5.     Right: No bleeding, inverted nipple, mass, nipple discharge, skin change or tenderness.     Left: No bleeding, inverted nipple, mass, nipple discharge, skin change or tenderness.  Genitourinary:    Exam position: Lithotomy position.     Tanner stage (genital): 5.     Labia:        Right: No tenderness.        Left: No tenderness.      Comments: Vaginal wall: pink and rugated, smooth and non-tender; absence of lesions, edema, and erythema. Labia Majora and Minora: present bilaterally, moist, soft tissue, and homogeneous; free of edema and ulcerations. Clitoris is anatomically present, above the urethral, and free of lesions,  masses, and ulceration.    Neurological:     Mental Status: She is alert.     BP (!) 142/90 (BP Location: Left Arm)   Pulse 87   Ht 5' (1.524 m)   Wt 260 lb 1.9 oz (118 kg)   SpO2 97%   BMI 50.80 kg/m  Wt Readings from Last 3 Encounters:  12/15/22 260 lb 1.9 oz (118 kg)  11/11/22 262 lb 0.6 oz (118.9 kg)  08/13/22 263 lb (119.3 kg)    Lab Results  Component Value Date   TSH 0.611 08/24/2022   Lab Results  Component Value Date   WBC 6.3 08/24/2022   HGB 11.3 08/24/2022   HCT 36.2 08/24/2022   MCV 79 08/24/2022   PLT 191 08/24/2022   Lab Results  Component Value Date   NA 142 08/24/2022   K 4.0 08/24/2022   CO2 24 08/24/2022   GLUCOSE 156 (H) 08/24/2022   BUN 16 08/24/2022   CREATININE 1.10 (H) 08/24/2022   BILITOT <0.2 08/24/2022   ALKPHOS 124 (H) 08/24/2022   AST 10 08/24/2022   ALT 15 08/24/2022   PROT 6.3 08/24/2022   ALBUMIN 3.7 (L) 08/24/2022   CALCIUM 9.3 08/24/2022   ANIONGAP 9 04/25/2020   EGFR 58 (L) 08/24/2022   Lab Results  Component Value Date   CHOL 170 08/24/2022   Lab Results  Component Value Date   HDL 61 08/24/2022   Lab Results  Component Value Date   LDLCALC 93 08/24/2022   Lab Results  Component Value Date   TRIG 85 08/24/2022   Lab Results  Component Value Date   CHOLHDL 2.8 08/24/2022   Lab Results  Component Value Date   HGBA1C 6.6 (H)  08/24/2022      Assessment & Plan:  Cervical cancer screening Assessment & Plan: -Cytology and HPV co-testing (preferred) every 5 years or cytology alone (acceptable) every 3 years. - Pap due 2029   Orders: -     Cytology - PAP  Type 2 diabetes mellitus with hyperglycemia, without long-term current use of insulin Assessment & Plan: Denies polyuria, polyphagia, and polydipsia She takes metformin 1000 mg twice daily We will start patient on Ozempic 0.25 mg weekly Overall follow-up on hemoglobin A1c in 3 months Lab Results  Component Value Date   HGBA1C 6.6 (H) 08/24/2022        Primary hypertension Assessment & Plan: Uncontrolled  She reports compliance with treatment regimen She takes olmesartan-amlodipine-hydrochlorothiazide 40-10-25 Denies chest pain, palpitation, shortness of breath, and visual disturbances No changes made to the treatment regimen currently Encouraged to continue therapy Will follow-up on BP in 2 weeks BP Readings from Last 3 Encounters:  12/15/22 (!) 142/90  11/11/22 (!) 142/82  08/13/22 122/78       Orders: -     Olmesartan-amLODIPine-HCTZ; Take 1 tablet by mouth daily.  Dispense: 30 tablet; Refill: 3  Depression, recurrent -     Escitalopram Oxalate; Take 1 tablet (10 mg total) by mouth daily.  Dispense: 30 tablet; Refill: 0  Chronic right shoulder pain -     Gabapentin; Take 1 capsule (300 mg total) by mouth daily.  Dispense: 30 capsule; Refill: 0  Type 2 diabetes mellitus without complication, without long-term current use of insulin -     metFORMIN HCl ER; Take 2 tablets (1,000 mg total) by mouth 2 (two) times daily with a meal.  Dispense: 180 tablet; Refill: 1 -     Ozempic (0.25 or 0.5 MG/DOSE); Inject 0.25 mg into the skin once a week.  Dispense: 2 mL; Refill: 0    Follow-up: Return in about 2 weeks (around 12/29/2022) for BP.   Gilmore Laroche, FNP

## 2022-12-15 NOTE — Patient Instructions (Addendum)
I appreciate the opportunity to provide care to you today!    Follow up: 2 weeks for BP  Labs: Next visit  We will contact you with the results of your Pap examination  Please pick up your prescription of Ozempic 0.25 mg weekly at the pharmacy   Please continue to a heart-healthy diet and increase your physical activities. Try to exercise for at least five days a week.      It was a pleasure to see you and I look forward to continuing to work together on your health and well-being. Please do not hesitate to call the office if you need care or have questions about your care.   Have a wonderful day and week. With Gratitude, Gilmore Laroche MSN, FNP-BC

## 2022-12-16 DIAGNOSIS — Z124 Encounter for screening for malignant neoplasm of cervix: Secondary | ICD-10-CM | POA: Insufficient documentation

## 2022-12-16 DIAGNOSIS — E1165 Type 2 diabetes mellitus with hyperglycemia: Secondary | ICD-10-CM | POA: Insufficient documentation

## 2022-12-16 NOTE — Assessment & Plan Note (Signed)
Uncontrolled  She reports compliance with treatment regimen She takes olmesartan-amlodipine-hydrochlorothiazide 40-10-25 Denies chest pain, palpitation, shortness of breath, and visual disturbances No changes made to the treatment regimen currently Encouraged to continue therapy Will follow-up on BP in 2 weeks BP Readings from Last 3 Encounters:  12/15/22 (!) 142/90  11/11/22 (!) 142/82  08/13/22 122/78

## 2022-12-16 NOTE — Assessment & Plan Note (Addendum)
Denies polyuria, polyphagia, and polydipsia She takes metformin 1000 mg twice daily We will start patient on Ozempic 0.25 mg weekly Overall follow-up on hemoglobin A1c in 3 months Lab Results  Component Value Date   HGBA1C 6.6 (H) 08/24/2022

## 2022-12-16 NOTE — Assessment & Plan Note (Signed)
Cytology and HPV co-testing (preferred) every 5 years or cytology alone (acceptable) every 3 years. - Pap due 2029   

## 2022-12-20 LAB — CYTOLOGY - PAP
Adequacy: ABSENT
Comment: NEGATIVE
Diagnosis: NEGATIVE
High risk HPV: NEGATIVE

## 2022-12-20 NOTE — Progress Notes (Signed)
Please inform the patient that her pap smear was negative for abnormal growth or malignancy on her cervix.

## 2022-12-23 ENCOUNTER — Other Ambulatory Visit: Payer: Self-pay

## 2022-12-31 ENCOUNTER — Telehealth: Payer: Self-pay | Admitting: Family Medicine

## 2022-12-31 NOTE — Telephone Encounter (Signed)
Called patient to schedule Medicare Annual Wellness Visit (AWV). Left message for patient to call back and schedule Medicare Annual Wellness Visit (AWV).  Last date of AWV: : 11/02/2021   Please schedule an appointment at any time with Abby, NHA. .  If any questions, please contact me at [phone].  Thank you,  Judeth Cornfield,  AMB Clinical Support [facility] AWV Program Direct Dial ??[phone]

## 2023-01-03 ENCOUNTER — Encounter: Payer: Self-pay | Admitting: Family Medicine

## 2023-01-03 ENCOUNTER — Ambulatory Visit (INDEPENDENT_AMBULATORY_CARE_PROVIDER_SITE_OTHER): Payer: 59 | Admitting: Family Medicine

## 2023-01-03 ENCOUNTER — Telehealth: Payer: Self-pay | Admitting: Family Medicine

## 2023-01-03 VITALS — BP 158/104 | HR 79 | Wt 261.0 lb

## 2023-01-03 DIAGNOSIS — I10 Essential (primary) hypertension: Secondary | ICD-10-CM | POA: Diagnosis not present

## 2023-01-03 NOTE — Progress Notes (Signed)
Established Patient Office Visit  Subjective:  Patient ID: Gwendolyn Woodward, female    DOB: 23-Sep-1965  Age: 57 y.o. MRN: [id number]  CC:  Chief Complaint  Patient presents with   Hypertension    2 week f/u.     HPI Gwendolyn Woodward is a 57 y.o. female presents for blood pressure f/u. For the details of today's visit, please refer to the assessment and plan.     Past Medical History:  Diagnosis Date   Arthritis    back pain and knee pain, no meds   Asthma    Chest pain    + dyspnea   CVA (cerebral infarction) 1984 , 1988   x2, left sided weakness, history of dizziness   Depression with anxiety    History- no meds   Dysphagia    Fasting hyperglycemia    Gastroesophageal reflux disease    no meds   Headache(784.0)    OTC med PRN   History of anemia    S/p transfusions in 1999 at [facility] after vaginal bleeding; normal CBC and 09/2011   Hyperlipidemia    lipid profile in 11/2011: 203, 106, 58, 124   Hypertension    Lab 09/2011: Normal CMet except glucose of 109-169 and albumin-3.4; normal BP 09/2011-12/2011   Sleep apnea    Does not use CPAP - no longer has CPAP machine   Stroke [facility])     Past Surgical History:  Procedure Laterality Date   CESAREAN SECTION  1984, 1987   x 2   COLONOSCOPY  01/27/2012   Procedure: COLONOSCOPY;  Surgeon: West Bali, MD;  [facility];  Service: Endoscopy;  Laterality: N/A;  8:30   ENDOMETRIAL ABLATION  Feb 2013   ESOPHAGOGASTRODUODENOSCOPY  5/11   [facility]Dr Eliott Nine GERD distal esophagus, NEGATIVE bx for Barretts   EXTERNAL FIXATION LEG Right 04/19/2020   Procedure: EXTERNAL FIXATION RIGHT ANKLE;  Surgeon: Kathryne Hitch, MD;  Location: [facility] OR;  Service: Orthopedics;  Laterality: Right;   IUD REMOVAL  10/20/2011   Procedure: INTRAUTERINE DEVICE (IUD) REMOVAL;  Surgeon: Leslie Andrea, MD;  Location: [facility]S;  Service: Gynecology;  Laterality: N/A;   OPEN REDUCTION INTERNAL FIXATION (ORIF) TIBIA/FIBULA  FRACTURE Right 04/23/2020   Procedure: OPEN REDUCTION INTERNAL FIXATION (ORIF) TIBIA/FIBULA FRACTURE;  Surgeon: Roby Lofts, MD;  Location: [facility] OR;  Service: Orthopedics;  Laterality: Right;   TUBAL LIGATION     WISDOM TOOTH EXTRACTION      Family History  Problem Relation Age of Onset   Cancer Father        Lung/Throat   Heart disease Father    Hypertension Mother    Asthma Mother    Diabetes Maternal Uncle    Anesthesia problems Neg Hx     Social History   Socioeconomic History   Marital status: Divorced    Spouse name: Not on file   Number of children: 2   Years of education: Not on file   Highest education level: Not on file  Occupational History   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  Tobacco Use   Smoking status: Never   Smokeless tobacco: Never  Vaping Use   Vaping Use: Never used  Substance and Sexual Activity   Alcohol use: Not Currently    Comment: Socially, 2 times per yr   Drug use: No   Sexual activity: Yes    Birth control/protection: Surgical  Comment: tubal & ablation  Other Topics Concern   Not on file  Social History Narrative   Lives alone-2 grown children   Social Determinants of Health   Financial Resource Strain: Low Risk  (07/13/2022)   Overall Financial Resource Strain (CARDIA)    Difficulty of Paying Living Expenses: Not hard at all  Food Insecurity: No Food Insecurity (07/13/2022)   Hunger Vital Sign    Worried About Running Out of Food in the Last Year: Never true    Ran Out of Food in the Last Year: Never true  Transportation Needs: No Transportation Needs (07/13/2022)   PRAPARE - Administrator, Civil Service (Medical): No    Lack of Transportation (Non-Medical): No  Physical Activity: Insufficiently Active (07/13/2022)   Exercise Vital Sign    Days of Exercise per Week: 3 days    Minutes of Exercise per Session: 30 min  Stress: No Stress Concern Present (07/13/2022)   [employer] of Occupational Health - Occupational Stress Questionnaire    Feeling of Stress : Only a little  Recent Concern: Stress - Stress Concern Present (04/16/2022)   [employer] of Occupational Health - Occupational Stress Questionnaire    Feeling of Stress : To some extent  Social Connections: Moderately Integrated (07/13/2022)   Social Connection and Isolation Panel [NHANES]    Frequency of Communication with Friends and Family: Once a week    Frequency of Social Gatherings with Friends and Family: Three times a week    Attends Religious Services: More than 4 times per year    Active Member of Clubs or Organizations: Yes    Attends Engineer, structural: More than 4 times per year    Marital Status: Divorced  Catering manager Violence: Not on file    Outpatient Medications Prior to Visit  Medication Sig Dispense Refill   ACCU-CHEK GUIDE test strip      Accu-Chek Softclix Lancets lancets Once daily testing dx e11.9 50 each 1   atorvastatin (LIPITOR) 40 MG tablet Take 1 tablet (40 mg total) by mouth at bedtime. 30 tablet 11   brompheniramine-pseudoephedrine-DM 30-2-10 MG/5ML syrup Take 5 mLs by mouth 4 (four) times daily as needed. 120 mL 0   Cholecalciferol (VITAMIN D3) 25 MCG (1000 UT) CAPS Take 1 capsule (1,000 Units total) by mouth daily. 30 capsule 3   Cyanocobalamin (VITAMIN B12 PO) Take by mouth. Once daily     cyclobenzaprine (FLEXERIL) 10 MG tablet Take 1 tablet (10 mg total) by mouth 2 (two) times daily as needed for muscle spasms. 20 tablet 0   escitalopram (LEXAPRO) 10 MG tablet Take 1 tablet (10 mg total) by mouth daily. 30 tablet 0   gabapentin (NEURONTIN) 300 MG capsule Take 1 capsule (300 mg total) by mouth daily. 30 capsule 0   lidocaine (XYLOCAINE) 2 % solution Use as directed 15 mLs in the mouth or throat as needed for mouth pain. 100 mL 0   metFORMIN (GLUCOPHAGE-XR) 500 MG 24 hr tablet Take 2 tablets (1,000 mg total) by mouth 2 (two) times daily with a  meal. 180 tablet 1   Olmesartan-amLODIPine-HCTZ 40-10-25 MG TABS Take 1 tablet by mouth daily. 30 tablet 3   oxybutynin (DITROPAN XL) 10 MG 24 hr tablet Take 1 tablet (10 mg total) by mouth daily. 30 tablet 12   Semaglutide,0.25 or 0.5MG /DOS, (OZEMPIC, 0.25 OR 0.5 MG/DOSE,) 2 MG/3ML SOPN Inject 0.25 mg into the skin once a week. 2 mL 0   spironolactone (  ALDACTONE) 25 MG tablet Take 12.5 mg by mouth daily.     No facility-administered medications prior to visit.    Allergies  Allergen Reactions   Aspirin     INTERNAL BLEEDING   Tramadol Itching    ROS Review of Systems  Constitutional:  Negative for chills and fever.  Eyes:  Negative for visual disturbance.  Respiratory:  Negative for chest tightness and shortness of breath.   Neurological:  Negative for dizziness and headaches.      Objective:    Physical Exam HENT:     Head: Normocephalic.     Mouth/Throat:     Mouth: Mucous membranes are moist.  Cardiovascular:     Rate and Rhythm: Normal rate.     Heart sounds: Normal heart sounds.  Pulmonary:     Effort: Pulmonary effort is normal.     Breath sounds: Normal breath sounds.  Neurological:     Mental Status: She is alert.     BP (!) 158/104   Pulse 79   Wt 261 lb 0.6 oz (118.4 kg)   SpO2 98%   BMI 50.98 kg/m  Wt Readings from Last 3 Encounters:  01/03/23 261 lb 0.6 oz (118.4 kg)  12/15/22 260 lb 1.9 oz (118 kg)  11/11/22 262 lb 0.6 oz (118.9 kg)    Lab Results  Component Value Date   TSH 0.611 08/24/2022   Lab Results  Component Value Date   WBC 6.3 08/24/2022   HGB 11.3 08/24/2022   HCT 36.2 08/24/2022   MCV 79 08/24/2022   PLT 191 08/24/2022   Lab Results  Component Value Date   NA 142 08/24/2022   K 4.0 08/24/2022   CO2 24 08/24/2022   GLUCOSE 156 (H) 08/24/2022   BUN 16 08/24/2022   CREATININE 1.10 (H) 08/24/2022   BILITOT <0.2 08/24/2022   ALKPHOS 124 (H) 08/24/2022   AST 10 08/24/2022   ALT 15 08/24/2022   PROT 6.3 08/24/2022    ALBUMIN 3.7 (L) 08/24/2022   CALCIUM 9.3 08/24/2022   ANIONGAP 9 04/25/2020   EGFR 58 (L) 08/24/2022   Lab Results  Component Value Date   CHOL 170 08/24/2022   Lab Results  Component Value Date   HDL 61 08/24/2022   Lab Results  Component Value Date   LDLCALC 93 08/24/2022   Lab Results  Component Value Date   TRIG 85 08/24/2022   Lab Results  Component Value Date   CHOLHDL 2.8 08/24/2022   Lab Results  Component Value Date   HGBA1C 6.6 (H) 08/24/2022      Assessment & Plan:  Primary hypertension Assessment & Plan: Uncontrolled Reports not taking her blood pressure medication today Reports eating high salt foods yesterday for Mother's Day Encouraged to check her blood pressure daily and bring ambulatory readings with her at her next appointment The patient is asymptomatic today in the clinic Low-sodium diet increase physical activity encourage We will follow-up in a week BP Readings from Last 3 Encounters:  01/03/23 (!) 158/104  12/15/22 (!) 142/90  11/11/22 (!) 142/82     Orders: -     Microalbumin / creatinine urine ratio -     BMP8+EGFR    Follow-up: Return in about 1 week (around 01/10/2023) for BP.   Gilmore Laroche, FNP

## 2023-01-03 NOTE — Telephone Encounter (Signed)
Contacted Alden Server to schedule their annual wellness visit. Appointment made for 01/19/2023.  Thank you,  Judeth Cornfield,  AMB Clinical Support [facility] AWV Program Direct Dial ??[phone]

## 2023-01-03 NOTE — Patient Instructions (Addendum)
I appreciate the opportunity to provide care to you today!    Follow up:  1 week for BP  Your blood pressure in the clinic is elevated, likely because you did not take your blood pressure medication today and increased your dietary intake yesterday for Mother's Day. I recommend resuming therapy and taking her blood pressure medication daily, including the day of your appointment.  I want to check her blood pressure at home daily, at least an hour after you take your blood pressure medication, and write the readings.  If the top number is greater than 140 and the bottom number is greater than 90, please wait 10 minutes, recheck your blood pressure, and write down the second reading.  Your blood pressure goal is less than 140/90.   Please avoid food that is high in salt, and increase your physical activity at least 3 times daily.   Please continue to a heart-healthy diet and increase your physical activities. Try to exercise for at least five days a week.      It was a pleasure to see you and I look forward to continuing to work together on your health and well-being. Please do not hesitate to call the office if you need care or have questions about your care.   Have a wonderful day and week. With Gratitude, Gilmore Laroche MSN, FNP-BC

## 2023-01-03 NOTE — Assessment & Plan Note (Signed)
Uncontrolled Reports not taking her blood pressure medication today Reports eating high salt foods yesterday for Mother's Day Encouraged to check her blood pressure daily and bring ambulatory readings with her at her next appointment The patient is asymptomatic today in the clinic Low-sodium diet increase physical activity encourage We will follow-up in a week BP Readings from Last 3 Encounters:  01/03/23 (!) 158/104  12/15/22 (!) 142/90  11/11/22 (!) 142/82

## 2023-01-05 LAB — MICROALBUMIN / CREATININE URINE RATIO
Creatinine, Urine: 344.4 mg/dL
Microalb/Creat Ratio: 3 mg/g creat (ref 0–29)
Microalbumin, Urine: 9.9 ug/mL

## 2023-01-05 NOTE — Progress Notes (Signed)
Please inform the patient that her kidneys are stable and shows no evidence of diabetic nephropathy

## 2023-01-12 DIAGNOSIS — E118 Type 2 diabetes mellitus with unspecified complications: Secondary | ICD-10-CM | POA: Diagnosis not present

## 2023-01-13 ENCOUNTER — Ambulatory Visit: Payer: 59 | Admitting: Family Medicine

## 2023-01-19 ENCOUNTER — Telehealth: Payer: Self-pay

## 2023-01-19 ENCOUNTER — Ambulatory Visit (INDEPENDENT_AMBULATORY_CARE_PROVIDER_SITE_OTHER): Payer: 59

## 2023-01-19 VITALS — BP 142/90 | Ht 61.0 in | Wt 260.0 lb

## 2023-01-19 DIAGNOSIS — Z1211 Encounter for screening for malignant neoplasm of colon: Secondary | ICD-10-CM

## 2023-01-19 DIAGNOSIS — Z0000 Encounter for general adult medical examination without abnormal findings: Secondary | ICD-10-CM | POA: Diagnosis not present

## 2023-01-19 DIAGNOSIS — Z1231 Encounter for screening mammogram for malignant neoplasm of breast: Secondary | ICD-10-CM

## 2023-01-19 NOTE — Telephone Encounter (Signed)
Good morning,  Patient needs to reschedule her appt she was late for last week.   Needs a refill on her flexeril  Patient is requesting a referral to psychiatry for depression. She used to see someone but no longer does.
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
## 2023-01-19 NOTE — Telephone Encounter (Signed)
Called patient left voicmail to contact our office for an appointment, sent mychart message as well.

## 2023-01-19 NOTE — Telephone Encounter (Signed)
Only left a voicemail, does not have mychart set up

## 2023-01-19 NOTE — Patient Instructions (Signed)
Gwendolyn Woodward , Thank you for taking time to come for your Medicare Wellness Visit. I appreciate your ongoing commitment to your health goals. Please review the following plan we discussed and let me know if I can assist you in the future.   These are the goals we discussed:  Goals       CCM (DIABETES) EXPECTED OUTCOME:  MONITOR, SELF-MANAGE AND REDUCE SYMPTOMS OF DIABETES      Current Barriers:  Knowledge Deficits related to Diabetes management Chronic Disease Management support and education needs related to Diabetes, diet Patient reports checks CBG once daily fasting with readings usually 113-150 range, AIC on 08/24/22 6.6 Patient reports she does not always adhere to special diet, sometimes drinks sugary soft drinks and eats crackers, reports she is trying to drink mostly water Patient reports she has had 2 falls this past year Patient reports she is frustrated that she cannot seem to lose any weight, plans to talk with primary care provider at 11/12/22 visit about options Patient reports she [facility] at senior center and plans to try water aerobics at [facility]  Planned Interventions: Reviewed medications with patient and discussed importance of medication adherence;        Counseled on importance of regular laboratory monitoring as prescribed;        Discussed plans with patient for ongoing care management follow up and provided patient with direct contact information for care management team;      Advised patient, providing education and rationale, to check cbg once daily and record        call provider for findings outside established parameters;       Review of patient status, including review of consultants reports, relevant laboratory and other test results, and medications completed;       Advised patient to discuss any issues with blood sugar, medications with provider;      Reviewed importance of exercise/ walking and water aerobics (pt interested in) for management of Diabetes Reviewed  safety precautions Reviewed instructions given by primary care provider- drink at least 64 ounce of fluid daily Reviewed importance of taking Vitamin D and all medications as prescribed Reinforced carbohydrate modified diet  Symptom Management: Take medications as prescribed   Attend all scheduled provider appointments Call pharmacy for medication refills 3-7 days in advance of running out of medications Attend church or other social activities Perform all self care activities independently  Perform IADL's (shopping, preparing meals, housekeeping, managing finances) independently Call provider office for new concerns or questions  check blood sugar at prescribed times: once daily check feet daily for cuts, sores or redness enter blood sugar readings and medication or insulin into daily log take the blood sugar log to all doctor visits take the blood sugar meter to all doctor visits trim toenails straight across drink 6 to 8 glasses of water each day eat fish at least once per week fill half of plate with vegetables limit fast food meals to no more than 1 per week manage portion size prepare main meal at home 3 to 5 days each week read food labels for fat, fiber, carbohydrates and portion size wash and dry feet carefully every day wear comfortable, cotton socks wear comfortable, well-fitting shoes Check into water aerobics at [facility] Take vitamin D as prescribed Drink at least 64 ounces of fluid daily fall prevention strategies: change position slowly, use assistive device such as walker or cane (per provider recommendations) when walking, keep walkways clear, have good lighting in room. It  is important to contact your provider if you have any falls, maintain muscle strength/tone by exercise per provider recommendations.  Follow Up Plan: Telephone follow up appointment with care management team member scheduled for:  02/07/23 at 215 pm        CCM (HYPERTENSION) EXPECTED OUTCOME:  MONITOR, SELF-MANAGE AND REDUCE SYMPTOMS OF HYPERTENSION      Current Barriers:  Knowledge Deficits related to Hypertension management Chronic Disease Management support and education needs related to Hypertension, diet No Advanced Directives in place- already has information at home Patient reports lives alone, is independent in all aspects of her care, has adult daughter she can call of if needed,  has been exercising as much at [employer]. Patient reports she tries to check blood pressure at least weekly, states readings "usually good, on occasion get a high reading" Patient states she has prefilled medication packs and this works very well for her Patient reports she has finished outpatient OT for right shoulder issues and states " this has really helped", pt reports she is using bands and continuing with exercises Patient reports she has not been to outpatient group therapy at [facility] recently for mental health concerns, feels this is very helpful, states she will call today and make appointment, pt had LCSW services in the past and does not feel this is needed at present Patient reports she has been helping baby sit her grandchildren  Planned Interventions: Evaluation of current treatment plan related to hypertension self management and patient's adherence to plan as established by provider;   Reviewed medications with patient and discussed importance of compliance;  Counseled on the importance of exercise goals with target of 150 minutes per week Advised patient, providing education and rationale, to monitor blood pressure daily and record, calling PCP for findings outside established parameters;  Advised patient to discuss any issues with blood pressure, medications with provider; Discussed complications of poorly controlled blood pressure such as heart disease, stroke, circulatory complications, vision complications, kidney impairment, sexual dysfunction;  Pain assessment  completed Reinforced low sodium diet Reviewed importance of continuing to exercise and using bands Encouraged pt to call today and make follow up appointment with [facility]  Symptom Management: Take medications as prescribed   Attend all scheduled provider appointments Call pharmacy for medication refills 3-7 days in advance of running out of medications Attend church or other social activities Perform all self care activities independently  Perform IADL's (shopping, preparing meals, housekeeping, managing finances) independently Call provider office for new concerns or questions  check blood pressure 3 times per week choose a place to take my blood pressure (home, clinic or office, retail store) write blood pressure results in a log or diary learn about high blood pressure keep a blood pressure log take blood pressure log to all doctor appointments call doctor for signs and symptoms of high blood pressure keep all doctor appointments take medications for blood pressure exactly as prescribed report new symptoms to your doctor eat more whole grains, fruits and vegetables, lean meats and healthy fats Follow low sodium diet- read food labels for sodium content Continue exercising at [facility] Try to limit sugary soft drinks, choose unsweetened tea, water Continue at [facility] for mental health counseling- call today and make follow up appointment Continue using your bands at home  Follow Up Plan: Telephone follow up appointment with care management team member scheduled for:  02/07/23 at 215 pm        Depressive Symptoms Identified. Manage depression issues (pt-stated)  Time Frame: Short Term Goal Priority:  High Progress:  On Track  Start Date:  10/27/21 End Date:  05/17/22     Follow Up Date:  LCSW is discharging client today from CCM SW services   Depressive Symptoms Identified. Manage depression issues  Patient Coping Skills[facility] medical  appointments  Patient Deficits Depression Weight challenges  Patient Goals: Attend medical appointments in next 30 days Take medications as prescribed Try to have one or two social interactions weekly to build friendships with others  Follow up Plan:  LCSW  is discharging client today from CCM SW services. Client agreed to this plan .      Patient Stated      Pt states that she plans on getting her eye exam soon, no other goals at this time      Patient Stated      Patient states her goals for this year is to lose weight and buy her a house and [place].         This is a list of the screening recommended for you and due dates:  Health Maintenance  Topic Date Due   COVID-19 Vaccine (1) Never done   HIV Screening  Never done   DTaP/Tdap/Td vaccine (2 - Td or Tdap) 09/23/2022   Complete foot exam   12/30/2022   Zoster (Shingles) Vaccine (1 of 2) 04/05/2023*   Hemoglobin A1C  02/22/2023   Flu Shot  03/24/2023   Eye exam for diabetics  04/09/2023   Yearly kidney function blood test for diabetes  08/25/2023   Mammogram  09/30/2023   Yearly kidney health urinalysis for diabetes  01/03/2024   Medicare Annual Wellness Visit  01/19/2024   Pap Smear  12/14/2025   Colon Cancer Screening  05/13/2032   Hepatitis C Screening  Completed   HPV Vaccine  Aged Out  *Topic was postponed. The date shown is not the original due date.    Advanced directives: Advance directive discussed with you today. Even though you declined this today, please call our office should you change your mind, and we can give you the proper paperwork for you to fill out. Advance care planning is a way to make decisions about medical care that fits your values in case you are ever unable to make these decisions for yourself.   Conditions/risks identified: Aim for 30 minutes of exercise or brisk walking, 6-8 glasses of water, and 5 servings of fruits and vegetables each day.  Referrals have been  placed for you to have your yearly mammogram and colonoscopy. They will call you for these appts.   Next appointment: Follow up in one year for your annual wellness visit. 01/25/2024 at 9am via telephone.   Preventive Care 40-64 Years, Female Preventive care refers to lifestyle choices and visits with your health care provider that can promote health and wellness. What does preventive care include? A yearly physical exam. This is also called an annual well check. Dental exams once or twice a year. Routine eye exams. Ask your health care provider how often you should have your eyes checked. Personal lifestyle choices, including: Daily care of your teeth and gums. Regular physical activity. Eating a healthy diet. Avoiding tobacco and drug use. Limiting alcohol use. Practicing safe sex. Taking low-dose aspirin daily starting at age 69. Taking vitamin and mineral supplements as recommended by your health care provider. What happens during an annual well check? The services and screenings  done by your health care provider during your annual well check will depend on your age, overall health, lifestyle risk factors, and family history of disease. Counseling  Your health care provider may ask you questions about your: Alcohol use. Tobacco use. Drug use. Emotional well-being. Home and relationship well-being. Sexual activity. Eating habits. Work and work [employer]. Method of birth control. Menstrual cycle. Pregnancy history. Screening  You may have the following tests or measurements: Height, weight, and BMI. Blood pressure. Lipid and cholesterol levels. These may be checked every 5 years, or more frequently if you are over 68 years old. Skin check. Lung cancer screening. You may have this screening every year starting at age 45 if you have a 30-pack-year history of smoking and currently smoke or have quit within the past 15 years. Fecal occult blood test (FOBT) of the stool. You may  have this test every year starting at age 37. Flexible sigmoidoscopy or colonoscopy. You may have a sigmoidoscopy every 5 years or a colonoscopy every 10 years starting at age 67. Hepatitis C blood test. Hepatitis B blood test. Sexually transmitted disease (STD) testing. Diabetes screening. This is done by checking your blood sugar (glucose) after you have not eaten for a while (fasting). You may have this done every 1-3 years. Mammogram. This may be done every 1-2 years. Talk to your health care provider about when you should start having regular mammograms. This may depend on whether you have a family history of breast cancer. BRCA-related cancer screening. This may be done if you have a family history of breast, ovarian, tubal, or peritoneal cancers. Pelvic exam and Pap test. This may be done every 3 years starting at age 37. Starting at age 75, this may be done every 5 years if you have a Pap test in combination with an HPV test. Bone density scan. This is done to screen for osteoporosis. You may have this scan if you are at high risk for osteoporosis. Discuss your test results, treatment options, and if necessary, the need for more tests with your health care provider. Vaccines  Your health care provider may recommend certain vaccines, such as: Influenza vaccine. This is recommended every year. Tetanus, diphtheria, and acellular pertussis (Tdap, Td) vaccine. You may need a Td booster every 10 years. Zoster vaccine. You may need this after age 21. Pneumococcal 13-valent conjugate (PCV13) vaccine. You may need this if you have certain conditions and were not previously vaccinated. Pneumococcal polysaccharide (PPSV23) vaccine. You may need one or two doses if you smoke cigarettes or if you have certain conditions. Talk to your health care provider about which screenings and vaccines you need and how often you need them. This information is not intended to replace advice given to you by your  health care provider. Make sure you discuss any questions you have with your health care provider. Document Released: 09/05/2015 Document Revised: 04/28/2016 Document Reviewed: 06/10/2015 Elsevier Interactive Patient Education  2017 [employer].    Fall Prevention in the Home Falls can cause injuries. They can happen to people of all ages. There are many things you can do to make your home safe and to help prevent falls. What can I do on the outside of my home? Regularly fix the edges of walkways and driveways and fix any cracks. Remove anything that might make you trip as you walk through a door, such as a raised step or threshold. Trim any bushes or trees on the path to your home. Use bright  outdoor lighting. Clear any walking paths of anything that might make someone trip, such as rocks or tools. Regularly check to see if handrails are loose or broken. Make sure that both sides of any steps have handrails. Any raised decks and porches should have guardrails on the edges. Have any leaves, snow, or ice cleared regularly. Use sand or salt on walking paths during winter. Clean up any spills in your garage right away. This includes oil or grease spills. What can I do in the bathroom? Use night lights. Install grab bars by the toilet and in the tub and shower. Do not use towel bars as grab bars. Use non-skid mats or decals in the tub or shower. If you need to sit down in the shower, use a plastic, non-slip stool. Keep the floor dry. Clean up any water that spills on the floor as soon as it happens. Remove soap buildup in the tub or shower regularly. Attach bath mats securely with double-sided non-slip rug tape. Do not have throw rugs and other things on the floor that can make you trip. What can I do in the bedroom? Use night lights. Make sure that you have a light by your bed that is easy to reach. Do not use any sheets or blankets that are too big for your bed. They should not hang  down onto the floor. Have a firm chair that has side arms. You can use this for support while you get dressed. Do not have throw rugs and other things on the floor that can make you trip. What can I do in the kitchen? Clean up any spills right away. Avoid walking on wet floors. Keep items that you use a lot in easy-to-reach places. If you need to reach something above you, use a strong step stool that has a grab bar. Keep electrical cords out of the way. Do not use floor polish or wax that makes floors slippery. If you must use wax, use non-skid floor wax. Do not have throw rugs and other things on the floor that can make you trip. What can I do with my stairs? Do not leave any items on the stairs. Make sure that there are handrails on both sides of the stairs and use them. Fix handrails that are broken or loose. Make sure that handrails are as long as the stairways. Check any carpeting to make sure that it is firmly attached to the stairs. Fix any carpet that is loose or worn. Avoid having throw rugs at the top or bottom of the stairs. If you do have throw rugs, attach them to the floor with carpet tape. Make sure that you have a light switch at the top of the stairs and the bottom of the stairs. If you do not have them, ask someone to add them for you. What else can I do to help prevent falls? Wear shoes that: Do not have high heels. Have rubber bottoms. Are comfortable and fit you well. Are closed at the toe. Do not wear sandals. If you use a stepladder: Make sure that it is fully opened. Do not climb a closed stepladder. Make sure that both sides of the stepladder are locked into place. Ask someone to hold it for you, if possible. Clearly mark and make sure that you can see: Any grab bars or handrails. First and last steps. Where the edge of each step is. Use tools that help you move around (mobility aids) if they are needed. These  include: Canes. Walkers. Scooters. Crutches. Turn on the lights when you go into a dark area. Replace any light bulbs as soon as they burn out. Set up your furniture so you have a clear path. Avoid moving your furniture around. If any of your floors are uneven, fix them. If there are any pets around you, be aware of where they are. Review your medicines with your doctor. Some medicines can make you feel dizzy. This can increase your chance of falling. Ask your doctor what other things that you can do to help prevent falls. This information is not intended to replace advice given to you by your health care provider. Make sure you discuss any questions you have with your health care provider. Document Released: 06/05/2009 Document Revised: 01/15/2016 Document Reviewed: 09/13/2014 Elsevier Interactive Patient Education  2017 [employer].

## 2023-01-20 ENCOUNTER — Encounter: Payer: Self-pay | Admitting: *Deleted

## 2023-01-21 ENCOUNTER — Telehealth: Payer: Self-pay

## 2023-01-21 ENCOUNTER — Ambulatory Visit: Payer: 59

## 2023-01-21 ENCOUNTER — Other Ambulatory Visit: Payer: Self-pay | Admitting: Family Medicine

## 2023-01-21 DIAGNOSIS — E119 Type 2 diabetes mellitus without complications: Secondary | ICD-10-CM

## 2023-01-21 MED ORDER — OZEMPIC (0.25 OR 0.5 MG/DOSE) 2 MG/3ML ~~LOC~~ SOPN
0.5000 mg | PEN_INJECTOR | SUBCUTANEOUS | 0 refills | Status: DC
Start: 2023-01-21 — End: 2023-02-25

## 2023-01-21 NOTE — Telephone Encounter (Signed)
Ozempic 0.5 mg weekly sent to [facility]

## 2023-01-25 ENCOUNTER — Ambulatory Visit (INDEPENDENT_AMBULATORY_CARE_PROVIDER_SITE_OTHER): Payer: 59 | Admitting: Family Medicine

## 2023-01-25 ENCOUNTER — Ambulatory Visit: Payer: 59 | Admitting: Family Medicine

## 2023-01-25 ENCOUNTER — Encounter: Payer: Self-pay | Admitting: Family Medicine

## 2023-01-25 VITALS — BP 142/82 | HR 79 | Ht 60.0 in | Wt 265.1 lb

## 2023-01-25 DIAGNOSIS — G8929 Other chronic pain: Secondary | ICD-10-CM | POA: Diagnosis not present

## 2023-01-25 DIAGNOSIS — M5441 Lumbago with sciatica, right side: Secondary | ICD-10-CM | POA: Diagnosis not present

## 2023-01-25 MED ORDER — METHOCARBAMOL 500 MG PO TABS
500.0000 mg | ORAL_TABLET | Freq: Every evening | ORAL | 1 refills | Status: DC | PRN
Start: 2023-01-25 — End: 2023-05-11

## 2023-01-25 NOTE — Progress Notes (Signed)
Established Patient Office Visit  Subjective:  Patient ID: Gwendolyn Woodward, female    DOB: November 07, 1965  Age: 57 y.o. MRN: [id number]  CC:  Chief Complaint  Patient presents with   Back Pain    Pt reports low back pain on right side, radiates down to her leg, makes it hard to walk, ongoing for 5 months (08/26/2022) worsened last night (01/24/23)    HPI Gwendolyn Woodward is a 57 y.o. female with past medical history of hypertension, type 2 diabetes, and GERD presents for complaints of low back pain. For the details of today's visit, please refer to the assessment and plan.    Past Medical History:  Diagnosis Date   Arthritis    back pain and knee pain, no meds   Asthma    Chest pain    + dyspnea   CVA (cerebral infarction) 1984 , 1988   x2, left sided weakness, history of dizziness   Depression with anxiety    History- no meds   Dysphagia    Fasting hyperglycemia    Gastroesophageal reflux disease    no meds   Headache(784.0)    OTC med PRN   History of anemia    S/p transfusions in 1999 at [facility] after vaginal bleeding; normal CBC and 09/2011   Hyperlipidemia    lipid profile in 11/2011: 203, 106, 58, 124   Hypertension    Lab 09/2011: Normal CMet except glucose of 109-169 and albumin-3.4; normal BP 09/2011-12/2011   Sleep apnea    Does not use CPAP - no longer has CPAP machine   Stroke [facility])     Past Surgical History:  Procedure Laterality Date   CESAREAN SECTION  1984, 1987   x 2   COLONOSCOPY  01/27/2012   Procedure: COLONOSCOPY;  Surgeon: West Bali, MD;  Location: [facility]ITE;  Service: Endoscopy;  Laterality: N/A;  8:30   ENDOMETRIAL ABLATION  Feb 2013   ESOPHAGOGASTRODUODENOSCOPY  5/11   [facility] GERD distal esophagus, NEGATIVE bx for Barretts   EXTERNAL FIXATION LEG Right 04/19/2020   Procedure: EXTERNAL FIXATION RIGHT ANKLE;  Surgeon: Kathryne Hitch, MD;  Location: [facility] OR;  Service: Orthopedics;  Laterality: Right;   IUD  REMOVAL  10/20/2011   Procedure: INTRAUTERINE DEVICE (IUD) REMOVAL;  Surgeon: Leslie Andrea, MD;  Location: [facility] ORS;  Service: Gynecology;  Laterality: N/A;   OPEN REDUCTION INTERNAL FIXATION (ORIF) TIBIA/FIBULA FRACTURE Right 04/23/2020   Procedure: OPEN REDUCTION INTERNAL FIXATION (ORIF) TIBIA/FIBULA FRACTURE;  Surgeon: Roby Lofts, MD;  Location: [facility] OR;  Service: Orthopedics;  Laterality: Right;   TUBAL LIGATION     WISDOM TOOTH EXTRACTION      Family History  Problem Relation Age of Onset   Cancer Father        Lung/Throat   Heart disease Father    Hypertension Mother    Asthma Mother    Diabetes Maternal Uncle    Anesthesia problems Neg Hx     Social History   Socioeconomic History   Marital status: Divorced    Spouse name: Not on file   Number of children: 2   Years of education: Not on file   Highest education level: Not on file  Occupational History   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  Tobacco Use   Smoking status: Never   Smokeless tobacco: Never  Vaping Use   Vaping Use: Never used  Substance and Sexual Activity   Alcohol use: Not Currently    Comment: Socially, 2 times per yr   Drug use: No   Sexual activity: Yes    Birth control/protection: Surgical    Comment: tubal & ablation  Other Topics Concern   Not on file  Social History Narrative   Lives alone-2 grown children   Social Determinants of Health   Financial Resource Strain: Low Risk  (01/19/2023)   Overall Financial Resource Strain (CARDIA)    Difficulty of Paying Living Expenses: Not hard at all  Food Insecurity: No Food Insecurity (01/19/2023)   Hunger Vital Sign    Worried About Running Out of Food in the Last Year: Never true    Ran Out of Food in the Last Year: Never true  Transportation Needs: No Transportation Needs (01/19/2023)   PRAPARE - Administrator, Civil Service (Medical): No    Lack of Transportation (Non-Medical): No   Physical Activity: Sufficiently Active (01/19/2023)   Exercise Vital Sign    Days of Exercise per Week: 7 days    Minutes of Exercise per Session: 30 min  Stress: No Stress Concern Present (01/19/2023)   [employer] of Occupational Health - Occupational Stress Questionnaire    Feeling of Stress : Not at all  Social Connections: Socially Isolated (01/19/2023)   Social Connection and Isolation Panel [NHANES]    Frequency of Communication with Friends and Family: Once a week    Frequency of Social Gatherings with Friends and Family: Never    Attends Religious Services: More than 4 times per year    Active Member of [employer] or Organizations: No    Attends Banker Meetings: Never    Marital Status: Divorced  Catering manager Violence: Not At Risk (01/19/2023)   Humiliation, Afraid, Rape, and Kick questionnaire    Fear of Current or Ex-Partner: No    Emotionally Abused: No    Physically Abused: No    Sexually Abused: No    Outpatient Medications Prior to Visit  Medication Sig Dispense Refill   ACCU-CHEK GUIDE test strip      Accu-Chek Softclix Lancets lancets Once daily testing dx e11.9 50 each 1   atorvastatin (LIPITOR) 40 MG tablet Take 1 tablet (40 mg total) by mouth at bedtime. 30 tablet 11   brompheniramine-pseudoephedrine-DM 30-2-10 MG/5ML syrup Take 5 mLs by mouth 4 (four) times daily as needed. 120 mL 0   Cholecalciferol (VITAMIN D3) 25 MCG (1000 UT) CAPS Take 1 capsule (1,000 Units total) by mouth daily. 30 capsule 3   Cyanocobalamin (VITAMIN B12 PO) Take by mouth. Once daily     escitalopram (LEXAPRO) 10 MG tablet Take 1 tablet (10 mg total) by mouth daily. 30 tablet 0   gabapentin (NEURONTIN) 300 MG capsule Take 1 capsule (300 mg total) by mouth daily. 30 capsule 0   lidocaine (XYLOCAINE) 2 % solution Use as directed 15 mLs in the mouth or throat as needed for mouth pain. 100 mL 0   metFORMIN (GLUCOPHAGE-XR) 500 MG 24 hr tablet Take 2 tablets (1,000 mg total) by  mouth 2 (two) times daily with a meal. 180 tablet 1   Olmesartan-amLODIPine-HCTZ 40-10-25 MG TABS Take 1 tablet by mouth daily. 30 tablet 3   oxybutynin (DITROPAN XL) 10 MG 24 hr tablet Take 1 tablet (10 mg total) by mouth daily. 30 tablet 12   Semaglutide,0.25 or 0.5MG /DOS, (OZEMPIC, 0.25 OR 0.5 MG/DOSE,) 2 MG/3ML SOPN Inject 0.5 mg into the skin  once a week. 2 mL 0   spironolactone (ALDACTONE) 25 MG tablet Take 12.5 mg by mouth daily.     traZODone (DESYREL) 100 MG tablet Take 100 mg by mouth at bedtime.     cyclobenzaprine (FLEXERIL) 10 MG tablet Take 1 tablet (10 mg total) by mouth 2 (two) times daily as needed for muscle spasms. 20 tablet 0   No facility-administered medications prior to visit.    Allergies  Allergen Reactions   Aspirin     INTERNAL BLEEDING   Tramadol Itching    ROS Review of Systems  Constitutional:  Negative for chills and fever.  Eyes:  Negative for visual disturbance.  Respiratory:  Negative for chest tightness and shortness of breath.   Musculoskeletal:  Positive for back pain.  Neurological:  Negative for dizziness and headaches.      Objective:    Physical Exam HENT:     Head: Normocephalic.     Mouth/Throat:     Mouth: Mucous membranes are moist.  Cardiovascular:     Rate and Rhythm: Normal rate.     Heart sounds: Normal heart sounds.  Pulmonary:     Effort: Pulmonary effort is normal.     Breath sounds: Normal breath sounds.  Musculoskeletal:     Lumbar back: Tenderness present. No swelling, edema, deformity, signs of trauma, lacerations, spasms or bony tenderness. Normal range of motion. Positive right straight leg raise test. Negative left straight leg raise test. No scoliosis.  Neurological:     Mental Status: She is alert.     BP (!) 142/82   Pulse 79   Ht 5' (1.524 m)   Wt 265 lb 1.9 oz (120.3 kg)   SpO2 93%   BMI 51.78 kg/m  Wt Readings from Last 3 Encounters:  01/25/23 265 lb 1.9 oz (120.3 kg)  01/19/23 260 lb (117.9 kg)   01/03/23 261 lb 0.6 oz (118.4 kg)    Lab Results  Component Value Date   TSH 0.611 08/24/2022   Lab Results  Component Value Date   WBC 6.3 08/24/2022   HGB 11.3 08/24/2022   HCT 36.2 08/24/2022   MCV 79 08/24/2022   PLT 191 08/24/2022   Lab Results  Component Value Date   NA 142 08/24/2022   K 4.0 08/24/2022   CO2 24 08/24/2022   GLUCOSE 156 (H) 08/24/2022   BUN 16 08/24/2022   CREATININE 1.10 (H) 08/24/2022   BILITOT <0.2 08/24/2022   ALKPHOS 124 (H) 08/24/2022   AST 10 08/24/2022   ALT 15 08/24/2022   PROT 6.3 08/24/2022   ALBUMIN 3.7 (L) 08/24/2022   CALCIUM 9.3 08/24/2022   ANIONGAP 9 04/25/2020   EGFR 58 (L) 08/24/2022   Lab Results  Component Value Date   CHOL 170 08/24/2022   Lab Results  Component Value Date   HDL 61 08/24/2022   Lab Results  Component Value Date   LDLCALC 93 08/24/2022   Lab Results  Component Value Date   TRIG 85 08/24/2022   Lab Results  Component Value Date   CHOLHDL 2.8 08/24/2022   Lab Results  Component Value Date   HGBA1C 6.6 (H) 08/24/2022      Assessment & Plan:  Chronic right-sided low back pain with right-sided sciatica Assessment & Plan: Chronic condition Complains of right-sided lower back pain with sciatica Pain is rated in the clinic 2 out of 10 She notes that the and is worse at nighttime and when lying on the affected side Denies  bowel or bladder incontinence or lower extremity weakness No recent trauma or injury to lower back reported We will treat today with Robaxin 500 mg  nightly Ambulatory referral to physical therapy Encouraged conservative management with heat and cold therapy alternating Encouraged to perform back exercises for relief of her symptoms We will follow-up in 4 weeks  Orders: -     Methocarbamol; Take 1 tablet (500 mg total) by mouth at bedtime as needed for muscle spasms.  Dispense: 60 tablet; Refill: 1 -     Ambulatory referral to Physical Therapy   Note: This chart has  been completed using Engineer, civil (consulting) software, and while attempts have been made to ensure accuracy, certain words and phrases may not be transcribed as intended.    Follow-up: Return in about 1 month (around 02/24/2023).   Gilmore Laroche, FNP

## 2023-01-25 NOTE — Patient Instructions (Addendum)
I appreciate the opportunity to provide care to you today!    Follow up: 1 month for right-sided lumbar pain with sciatica   I recommend alternating with heat and cold therapy at the affected site Apply heat to the affected area such as a moist heat pack or a heating pad. Place a towel between your skin and the heat source. Leave the heat on for 20-30 minutes. Remove the heat if your skin turns bright red. This is especially important if you are unable to feel pain, heat, or cold. You may have a greater risk of getting burned. Apply  ice on the painful area. To do this: If you have a removable splint, remove it as told by your health care provider. Put ice in a plastic bag. Place a towel between your skin and the bag or between your splint and the bag. Leave the ice on for 20 minutes, 2-3 times a day. Please take Robaxin 750 mg nightly and Tylenol as needed for pain relief  Please perform back exercises attached your AVS for relief of your symptoms   Referrals today-physical therapy   Please continue to a heart-healthy diet and increase your physical activities. Try to exercise for at least five days a week.      It was a pleasure to see you and I look forward to continuing to work together on your health and well-being. Please do not hesitate to call the office if you need care or have questions about your care.   Have a wonderful day and week. With Gratitude, Gilmore Laroche MSN, FNP-BC

## 2023-01-25 NOTE — Assessment & Plan Note (Addendum)
Chronic condition Complains of right-sided lower back pain with sciatica Pain is rated in the clinic 2 out of 10 She notes that the and is worse at nighttime and when lying on the affected side Denies bowel or bladder incontinence or lower extremity weakness No recent trauma or injury to lower back reported We will treat today with Robaxin 500 mg  nightly Ambulatory referral to physical therapy Encouraged conservative management with heat and cold therapy alternating Encouraged to perform back exercises for relief of her symptoms We will follow-up in 4 weeks

## 2023-02-07 ENCOUNTER — Ambulatory Visit (INDEPENDENT_AMBULATORY_CARE_PROVIDER_SITE_OTHER): Payer: 59 | Admitting: *Deleted

## 2023-02-07 DIAGNOSIS — I10 Essential (primary) hypertension: Secondary | ICD-10-CM

## 2023-02-07 DIAGNOSIS — E119 Type 2 diabetes mellitus without complications: Secondary | ICD-10-CM

## 2023-02-07 NOTE — Chronic Care Management (AMB) (Signed)
Chronic Care Management   CCM RN Visit Note  02/07/2023 Name: Gwendolyn Woodward MRN: [id number] DOB: Mar 25, 1966  Subjective: Gwendolyn Woodward is a 57 y.o. year old female who is a primary care patient of Gilmore Laroche, FNP. The patient was referred to the Chronic Care Management team for assistance with care management needs subsequent to provider initiation of CCM services and plan of care.    Today's Visit:  Engaged with patient by telephone for follow up visit.        Goals Addressed             This Visit's Progress    CCM (DIABETES) EXPECTED OUTCOME:  MONITOR, SELF-MANAGE AND REDUCE SYMPTOMS OF DIABETES       Current Barriers:  Knowledge Deficits related to Diabetes management Chronic Disease Management support and education needs related to Diabetes, diet Patient reports checks CBG once daily fasting with readings in 98-139 range, AIC on 08/24/22 6.6 Patient reports she does not always adhere to special diet, sometimes drinks sugary soft drinks and eats crackers, reports she is trying to drink mostly water Patient reports she has had 2 falls this past year Patient reports she exercises at senior center and plans to try water aerobics at [facility] Patient reports she is supposed to have colonoscopy but has not heard anything from referral, noted in EMR, questionnaire was mailed to pt and upon mailing back, [facility] will call and set up appointment, pt states she does not remember receiving questionnaire in the mail and asks if it can be resent.  Planned Interventions: Reviewed medications with patient and discussed importance of medication adherence;        Counseled on importance of regular laboratory monitoring as prescribed;        Discussed plans with patient for ongoing care management follow up and provided patient with direct contact information for care management team;      Advised patient, providing education and rationale, to check cbg once daily and  record        call provider for findings outside established parameters;       Review of patient status, including review of consultants reports, relevant laboratory and other test results, and medications completed;       Advised patient to discuss any issues with blood sugar, medications with provider;      Reviewed importance of exercise/ walking and water aerobics (pt interested in) for management of Diabetes Reviewed instructions given by primary care provider- drink at least 64 ounce of fluid daily Reinforced importance of taking Vitamin D and all medications as prescribed Reviewed carbohydrate modified diet Telephone call to [facility], unable to speak with anyone and directed to leave voicemail, RN care manager left VM requesting questionnaire be re-mailed to patient so she can schedule colonoscopy  Symptom Management: Take medications as prescribed   Attend all scheduled provider appointments Call pharmacy for medication refills 3-7 days in advance of running out of medications Attend church or other social activities Perform all self care activities independently  Perform IADL's (shopping, preparing meals, housekeeping, managing finances) independently Call provider office for new concerns or questions  check blood sugar at prescribed times: once daily check feet daily for cuts, sores or redness enter blood sugar readings and medication or insulin into daily log take the blood sugar log to all doctor visits take the blood sugar meter to all doctor visits trim toenails straight across drink 6 to 8 glasses of water each day eat fish  at least once per week fill half of plate with vegetables limit fast food meals to no more than 1 per week manage portion size prepare main meal at home 3 to 5 days each week read food labels for fat, fiber, carbohydrates and portion size wash and dry feet carefully every day wear comfortable, cotton socks wear comfortable,  well-fitting shoes Take vitamin D as prescribed Drink at least 64 ounces of fluid daily Requested that questionnaire be re-mailed to you, please complete and get colonoscopy scheduled fall prevention strategies: change position slowly, use assistive device such as walker or cane (per provider recommendations) when walking, keep walkways clear, have good lighting in room. It is important to contact your provider if you have any falls, maintain muscle strength/tone by exercise per provider recommendations.  Follow Up Plan: Telephone follow up appointment with care management team member scheduled for:  03/25/23 at 3 pm       CCM (HYPERTENSION) EXPECTED OUTCOME: MONITOR, SELF-MANAGE AND REDUCE SYMPTOMS OF HYPERTENSION       Current Barriers:  Knowledge Deficits related to Hypertension management Chronic Disease Management support and education needs related to Hypertension, diet No Advanced Directives in place- already has information at home Patient reports lives alone, is independent in all aspects of her care, has adult daughter she can call of if needed Patient reports she is walking daily at [facility] store Patient reports she is checking blood pressure daily and keeping a log as instructed by primary care provider due to blood pressure being elevated, pt reports recent readings 139/86, 159/99, 157/82, pt is to follow up with primary care provider on 02/23/23 and take BP log with her Patient states she has prefilled medication packs and this works very well for her Patient reports she has been helping baby sit her grandchildren  Planned Interventions: Evaluation of current treatment plan related to hypertension self management and patient's adherence to plan as established by provider;   Reviewed medications with patient and discussed importance of compliance;  Counseled on the importance of exercise goals with target of 150 minutes per week Advised patient, providing education and rationale, to  monitor blood pressure daily and record, calling PCP for findings outside established parameters;  Advised patient to discuss any issues with blood pressure, medications with provider; Discussed complications of poorly controlled blood pressure such as heart disease, stroke, circulatory complications, vision complications, kidney impairment, sexual dysfunction;  Pain assessment completed Reviewed low sodium diet and importance of reading labels for sodium content Reviewed importance of continuing to exercise and using bands Reinforced importance of checking blood pressure daily and keeping a log and bringing log to primary care provider appointments Reviewed normal parameters for blood pressure  Symptom Management: Take medications as prescribed   Attend all scheduled provider appointments Call pharmacy for medication refills 3-7 days in advance of running out of medications Attend church or other social activities Perform all self care activities independently  Perform IADL's (shopping, preparing meals, housekeeping, managing finances) independently Call provider office for new concerns or questions  check blood pressure 3 times per week choose a place to take my blood pressure (home, clinic or office, retail store) write blood pressure results in a log or diary learn about high blood pressure keep a blood pressure log take blood pressure log to all doctor appointments call doctor for signs and symptoms of high blood pressure keep all doctor appointments take medications for blood pressure exactly as prescribed report new symptoms to your doctor eat more whole  grains, fruits and vegetables, lean meats and healthy fats Follow low sodium diet- read food labels for sodium content Continue exercising / walking Try to limit sugary soft drinks, choose unsweetened tea, water Continue using your bands at home It is very important that you check blood pressure daily and keep a log, take to  doctor's appointment  Follow Up Plan: Telephone follow up appointment with care management team member scheduled for:  03/25/23 at 3 pm          Plan:Telephone follow up appointment with care management team member scheduled for:  03/25/23 at 3 pm  Irving Shows [facility], BSN RN Case Manager [facility] [phone]

## 2023-02-07 NOTE — Patient Instructions (Addendum)
Please call the care guide team at [phone] if you need to cancel or reschedule your appointment.   If you are experiencing a Mental Health or Behavioral Health Crisis or need someone to talk to, please call the Suicide and Crisis Lifeline: 988 call the [place] National Suicide Prevention Lifeline: (438)073-4215 or TTY: 480-571-5973 TTY 316-510-0034) to talk to a trained counselor call 1-800-273-TALK (toll free, 24 hour hotline) go to [facility] [address] [place] [phone]) call the [facility]: [phone] call 911   Following is a copy of the CCM Program Consent:  CCM service includes personalized support from designated clinical staff supervised by the physician, including individualized plan of care and coordination with other care providers 24/7 contact phone numbers for assistance for urgent and routine care needs. Service will only be billed when office clinical staff spend 20 minutes or more in a month to coordinate care. Only one practitioner may furnish and bill the service in a calendar month. The patient may stop CCM services at amy time (effective at the end of the month) by phone call to the office staff. The patient will be responsible for cost sharing (co-pay) or up to 20% of the service fee (after annual deductible is met)  Following is a copy of your full provider care plan:   Goals Addressed             This Visit's Progress    CCM (DIABETES) EXPECTED OUTCOME:  MONITOR, SELF-MANAGE AND REDUCE SYMPTOMS OF DIABETES       Current Barriers:  Knowledge Deficits related to Diabetes management Chronic Disease Management support and education needs related to Diabetes, diet Patient reports checks CBG once daily fasting with readings in 98-139 range, AIC on 08/24/22 6.6 Patient reports she does not always adhere to special diet, sometimes drinks sugary soft drinks and eats crackers, reports she is trying to  drink mostly water Patient reports she has had 2 falls this past year Patient reports she exercises at senior center and plans to try water aerobics at [facility] Patient reports she is supposed to have colonoscopy but has not heard anything from referral, noted in EMR, questionnaire was mailed to pt and upon mailing back, [facility] will call and set up appointment, pt states she does not remember receiving questionnaire in the mail and asks if it can be resent.  Planned Interventions: Reviewed medications with patient and discussed importance of medication adherence;        Counseled on importance of regular laboratory monitoring as prescribed;        Discussed plans with patient for ongoing care management follow up and provided patient with direct contact information for care management team;      Advised patient, providing education and rationale, to check cbg once daily and record        call provider for findings outside established parameters;       Review of patient status, including review of consultants reports, relevant laboratory and other test results, and medications completed;       Advised patient to discuss any issues with blood sugar, medications with provider;      Reviewed importance of exercise/ walking and water aerobics (pt interested in) for management of Diabetes Reviewed instructions given by primary care provider- drink at least 64 ounce of fluid daily Reinforced importance of taking Vitamin D and all medications as prescribed Reviewed carbohydrate modified diet Telephone call to [facility], unable to speak with  anyone and directed to leave voicemail, RN care manager left VM requesting questionnaire be re-mailed to patient so she can schedule colonoscopy  Symptom Management: Take medications as prescribed   Attend all scheduled provider appointments Call pharmacy for medication refills 3-7 days in advance of running out of  medications Attend church or other social activities Perform all self care activities independently  Perform IADL's (shopping, preparing meals, housekeeping, managing finances) independently Call provider office for new concerns or questions  check blood sugar at prescribed times: once daily check feet daily for cuts, sores or redness enter blood sugar readings and medication or insulin into daily log take the blood sugar log to all doctor visits take the blood sugar meter to all doctor visits trim toenails straight across drink 6 to 8 glasses of water each day eat fish at least once per week fill half of plate with vegetables limit fast food meals to no more than 1 per week manage portion size prepare main meal at home 3 to 5 days each week read food labels for fat, fiber, carbohydrates and portion size wash and dry feet carefully every day wear comfortable, cotton socks wear comfortable, well-fitting shoes Take vitamin D as prescribed Drink at least 64 ounces of fluid daily Requested that questionnaire be re-mailed to you, please complete and get colonoscopy scheduled fall prevention strategies: change position slowly, use assistive device such as walker or cane (per provider recommendations) when walking, keep walkways clear, have good lighting in room. It is important to contact your provider if you have any falls, maintain muscle strength/tone by exercise per provider recommendations.  Follow Up Plan: Telephone follow up appointment with care management team member scheduled for:  03/25/23 at 3 pm       CCM (HYPERTENSION) EXPECTED OUTCOME: MONITOR, SELF-MANAGE AND REDUCE SYMPTOMS OF HYPERTENSION       Current Barriers:  Knowledge Deficits related to Hypertension management Chronic Disease Management support and education needs related to Hypertension, diet No Advanced Directives in place- already has information at home Patient reports lives alone, is independent in all aspects  of her care, has adult daughter she can call of if needed Patient reports she is walking daily at [facility] store Patient reports she is checking blood pressure daily and keeping a log as instructed by primary care provider due to blood pressure being elevated, pt reports recent readings 139/86, 159/99, 157/82, pt is to follow up with primary care provider on 02/23/23 and take BP log with her Patient states she has prefilled medication packs and this works very well for her Patient reports she has been helping baby sit her grandchildren  Planned Interventions: Evaluation of current treatment plan related to hypertension self management and patient's adherence to plan as established by provider;   Reviewed medications with patient and discussed importance of compliance;  Counseled on the importance of exercise goals with target of 150 minutes per week Advised patient, providing education and rationale, to monitor blood pressure daily and record, calling PCP for findings outside established parameters;  Advised patient to discuss any issues with blood pressure, medications with provider; Discussed complications of poorly controlled blood pressure such as heart disease, stroke, circulatory complications, vision complications, kidney impairment, sexual dysfunction;  Pain assessment completed Reviewed low sodium diet and importance of reading labels for sodium content Reviewed importance of continuing to exercise and using bands Reinforced importance of checking blood pressure daily and keeping a log and bringing log to primary care provider appointments Reviewed normal parameters  for blood pressure  Symptom Management: Take medications as prescribed   Attend all scheduled provider appointments Call pharmacy for medication refills 3-7 days in advance of running out of medications Attend church or other social activities Perform all self care activities independently  Perform IADL's (shopping, preparing  meals, housekeeping, managing finances) independently Call provider office for new concerns or questions  check blood pressure 3 times per week choose a place to take my blood pressure (home, clinic or office, retail store) write blood pressure results in a log or diary learn about high blood pressure keep a blood pressure log take blood pressure log to all doctor appointments call doctor for signs and symptoms of high blood pressure keep all doctor appointments take medications for blood pressure exactly as prescribed report new symptoms to your doctor eat more whole grains, fruits and vegetables, lean meats and healthy fats Follow low sodium diet- read food labels for sodium content Continue exercising / walking Try to limit sugary soft drinks, choose unsweetened tea, water Continue using your bands at home It is very important that you check blood pressure daily and keep a log, take to doctor's appointment  Follow Up Plan: Telephone follow up appointment with care management team member scheduled for:  03/25/23 at 3 pm          The patient verbalized understanding of instructions, educational materials, and care plan provided today and DECLINED offer to receive copy of patient instructions, educational materials, and care plan.  Telephone follow up appointment with care management team member scheduled for:   03/25/23 at 3 pm

## 2023-02-11 DIAGNOSIS — E118 Type 2 diabetes mellitus with unspecified complications: Secondary | ICD-10-CM | POA: Diagnosis not present

## 2023-02-17 ENCOUNTER — Ambulatory Visit (INDEPENDENT_AMBULATORY_CARE_PROVIDER_SITE_OTHER): Payer: 59

## 2023-02-17 DIAGNOSIS — E119 Type 2 diabetes mellitus without complications: Secondary | ICD-10-CM

## 2023-02-17 NOTE — Progress Notes (Signed)
Walked patient through how to properly administer the shot herself. She understood and did well. She now feels comfortable giving this to herself

## 2023-02-20 DIAGNOSIS — E119 Type 2 diabetes mellitus without complications: Secondary | ICD-10-CM

## 2023-02-20 DIAGNOSIS — I10 Essential (primary) hypertension: Secondary | ICD-10-CM

## 2023-02-23 ENCOUNTER — Encounter: Payer: Self-pay | Admitting: *Deleted

## 2023-02-23 ENCOUNTER — Other Ambulatory Visit: Payer: Self-pay | Admitting: Family Medicine

## 2023-02-23 ENCOUNTER — Ambulatory Visit (INDEPENDENT_AMBULATORY_CARE_PROVIDER_SITE_OTHER): Payer: 59 | Admitting: Family Medicine

## 2023-02-23 ENCOUNTER — Encounter: Payer: Self-pay | Admitting: Family Medicine

## 2023-02-23 VITALS — BP 138/86 | HR 85 | Ht 61.0 in | Wt 267.1 lb

## 2023-02-23 DIAGNOSIS — N6310 Unspecified lump in the right breast, unspecified quadrant: Secondary | ICD-10-CM

## 2023-02-23 DIAGNOSIS — Z1211 Encounter for screening for malignant neoplasm of colon: Secondary | ICD-10-CM

## 2023-02-23 DIAGNOSIS — M5441 Lumbago with sciatica, right side: Secondary | ICD-10-CM | POA: Diagnosis not present

## 2023-02-23 DIAGNOSIS — G8929 Other chronic pain: Secondary | ICD-10-CM

## 2023-02-23 DIAGNOSIS — I10 Essential (primary) hypertension: Secondary | ICD-10-CM | POA: Diagnosis not present

## 2023-02-23 DIAGNOSIS — N6320 Unspecified lump in the left breast, unspecified quadrant: Secondary | ICD-10-CM

## 2023-02-23 MED ORDER — PROPRANOLOL HCL 10 MG PO TABS
10.0000 mg | ORAL_TABLET | Freq: Every day | ORAL | 1 refills | Status: DC
Start: 2023-02-23 — End: 2023-04-18

## 2023-02-23 NOTE — Assessment & Plan Note (Signed)
Stable no complaints or concerns voiced

## 2023-02-23 NOTE — Patient Instructions (Addendum)
I appreciate the opportunity to provide care to you today!    Follow up:  1 month  Labs:   Reading food labels Check food labels for the amount of salt (sodium) per serving. Choose foods with less than 5 percent of the Daily Value (DV) of sodium. In general, foods with less than 300 milligrams (mg) of sodium per serving fit into this eating plan. To find whole grains, look for the word "whole" as the first word in the ingredient list.     Please continue taking olmesartan-amlodipine-hydrochlorothiazide 40-10-25 and propranolol 10 mg daily   Please continue to a heart-healthy diet and increase your physical activities. Try to exercise for at least five days a week.      It was a pleasure to see you and I look forward to continuing to work together on your health and well-being. Please do not hesitate to call the office if you need care or have questions about your care.   Have a wonderful day and week. With Gratitude, Gilmore Laroche MSN, FNP-BC

## 2023-02-23 NOTE — Progress Notes (Signed)
Established Patient Office Visit  Subjective:  Patient ID: BELIEVE ROBBIN, female    DOB: February 09, 1966  Age: 57 y.o. MRN: [id number]  CC:  Chief Complaint  Patient presents with   Chronic Care Management    1 month f/u for right sided sciatica .    Hypertension    Pt reports checking bp at home has been having up and down readings.     HPI Gwendolyn Woodward is a 57 y.o. female presents for 1 month follow up. For the details of today's visit, please refer to the assessment and plan.     Past Medical History:  Diagnosis Date   Arthritis    back pain and knee pain, no meds   Asthma    Chest pain    + dyspnea   CVA (cerebral infarction) 1984 , 1988   x2, left sided weakness, history of dizziness   Depression with anxiety    History- no meds   Dysphagia    Fasting hyperglycemia    Gastroesophageal reflux disease    no meds   Headache(784.0)    OTC med PRN   History of anemia    S/p transfusions in 1999 at [facility] after vaginal bleeding; normal CBC and 09/2011   Hyperlipidemia    lipid profile in 11/2011: 203, 106, 58, 124   Hypertension    Lab 09/2011: Normal CMet except glucose of 109-169 and albumin-3.4; normal BP 09/2011-12/2011   Sleep apnea    Does not use CPAP - no longer has CPAP machine   Stroke [facility])     Past Surgical History:  Procedure Laterality Date   CESAREAN SECTION  1984, 1987   x 2   COLONOSCOPY  01/27/2012   Procedure: COLONOSCOPY;  Surgeon: West Bali, MD;  Location: [facility] ENDO SUITE;  Service: Endoscopy;  Laterality: N/A;  8:30   ENDOMETRIAL ABLATION  Feb 2013   ESOPHAGOGASTRODUODENOSCOPY  5/11   [facility]-Dr Eliott Nine GERD distal esophagus, NEGATIVE bx for Barretts   EXTERNAL FIXATION LEG Right 04/19/2020   Procedure: EXTERNAL FIXATION RIGHT ANKLE;  Surgeon: Kathryne Hitch, MD;  Location: [facility] OR;  Service: Orthopedics;  Laterality: Right;   IUD REMOVAL  10/20/2011   Procedure: INTRAUTERINE DEVICE (IUD) REMOVAL;  Surgeon: Leslie Andrea, MD;  Location: [facility] ORS;  Service: Gynecology;  Laterality: N/A;   OPEN REDUCTION INTERNAL FIXATION (ORIF) TIBIA/FIBULA FRACTURE Right 04/23/2020   Procedure: OPEN REDUCTION INTERNAL FIXATION (ORIF) TIBIA/FIBULA FRACTURE;  Surgeon: Roby Lofts, MD;  Location: [facility] OR;  Service: Orthopedics;  Laterality: Right;   TUBAL LIGATION     WISDOM TOOTH EXTRACTION      Family History  Problem Relation Age of Onset   Cancer Father        Lung/Throat   Heart disease Father    Hypertension Mother    Asthma Mother    Diabetes Maternal Uncle    Anesthesia problems Neg Hx     Social History   Socioeconomic History   Marital status: Divorced    Spouse name: Not on file   Number of children: 2   Years of education: Not on file   Highest education level: Not on file  Occupational History   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  Tobacco Use   Smoking status: Never   Smokeless tobacco: Never  Vaping Use   Vaping Use: Never used  Substance and Sexual Activity   Alcohol use:  Not Currently    Comment: Socially, 2 times per yr   Drug use: No   Sexual activity: Yes    Birth control/protection: Surgical    Comment: tubal & ablation  Other Topics Concern   Not on file  Social History Narrative   Lives alone-2 grown children   Social Determinants of Health   Financial Resource Strain: Low Risk  (01/19/2023)   Overall Financial Resource Strain (CARDIA)    Difficulty of Paying Living Expenses: Not hard at all  Food Insecurity: No Food Insecurity (01/19/2023)   Hunger Vital Sign    Worried About Running Out of Food in the Last Year: Never true    Ran Out of Food in the Last Year: Never true  Transportation Needs: No Transportation Needs (01/19/2023)   PRAPARE - Administrator, Civil Service (Medical): No    Lack of Transportation (Non-Medical): No  Physical Activity: Sufficiently Active (01/19/2023)   Exercise Vital Sign    Days of  Exercise per Week: 7 days    Minutes of Exercise per Session: 30 min  Stress: No Stress Concern Present (01/19/2023)   [employer] of Occupational Health - Occupational Stress Questionnaire    Feeling of Stress : Not at all  Social Connections: Socially Isolated (01/19/2023)   Social Connection and Isolation Panel [NHANES]    Frequency of Communication with Friends and Family: Once a week    Frequency of Social Gatherings with Friends and Family: Never    Attends Religious Services: More than 4 times per year    Active Member of [employer] or Organizations: No    Attends Banker Meetings: Never    Marital Status: Divorced  Catering manager Violence: Not At Risk (01/19/2023)   Humiliation, Afraid, Rape, and Kick questionnaire    Fear of Current or Ex-Partner: No    Emotionally Abused: No    Physically Abused: No    Sexually Abused: No    Outpatient Medications Prior to Visit  Medication Sig Dispense Refill   ACCU-CHEK GUIDE test strip      Accu-Chek Softclix Lancets lancets Once daily testing dx e11.9 50 each 1   atorvastatin (LIPITOR) 40 MG tablet Take 1 tablet (40 mg total) by mouth at bedtime. 30 tablet 11   brompheniramine-pseudoephedrine-DM 30-2-10 MG/5ML syrup Take 5 mLs by mouth 4 (four) times daily as needed. 120 mL 0   Cholecalciferol (VITAMIN D3) 25 MCG (1000 UT) CAPS Take 1 capsule (1,000 Units total) by mouth daily. 30 capsule 3   Cyanocobalamin (VITAMIN B12 PO) Take by mouth. Once daily     escitalopram (LEXAPRO) 10 MG tablet Take 1 tablet (10 mg total) by mouth daily. 30 tablet 0   gabapentin (NEURONTIN) 300 MG capsule Take 1 capsule (300 mg total) by mouth daily. 30 capsule 0   lidocaine (XYLOCAINE) 2 % solution Use as directed 15 mLs in the mouth or throat as needed for mouth pain. 100 mL 0   metFORMIN (GLUCOPHAGE-XR) 500 MG 24 hr tablet Take 2 tablets (1,000 mg total) by mouth 2 (two) times daily with a meal. 180 tablet 1   methocarbamol (ROBAXIN) 500 MG  tablet Take 1 tablet (500 mg total) by mouth at bedtime as needed for muscle spasms. 60 tablet 1   Olmesartan-amLODIPine-HCTZ 40-10-25 MG TABS Take 1 tablet by mouth daily. 30 tablet 3   oxybutynin (DITROPAN XL) 10 MG 24 hr tablet Take 1 tablet (10 mg total) by mouth daily. 30 tablet 12  Semaglutide,0.25 or 0.5MG /DOS, (OZEMPIC, 0.25 OR 0.5 MG/DOSE,) 2 MG/3ML SOPN Inject 0.5 mg into the skin once a week. 2 mL 0   spironolactone (ALDACTONE) 25 MG tablet Take 12.5 mg by mouth daily.     traZODone (DESYREL) 100 MG tablet Take 100 mg by mouth at bedtime.     No facility-administered medications prior to visit.    Allergies  Allergen Reactions   Aspirin     INTERNAL BLEEDING   Tramadol Itching    ROS Review of Systems  Constitutional:  Negative for chills and fever.  Eyes:  Negative for visual disturbance.  Respiratory:  Negative for chest tightness and shortness of breath.   Neurological:  Negative for dizziness and headaches.      Objective:    Physical Exam HENT:     Head: Normocephalic.     Mouth/Throat:     Mouth: Mucous membranes are moist.  Cardiovascular:     Rate and Rhythm: Normal rate.     Heart sounds: Normal heart sounds.  Pulmonary:     Effort: Pulmonary effort is normal.     Breath sounds: Normal breath sounds.  Neurological:     Mental Status: She is alert.     BP 138/86 (BP Location: Left Arm)   Pulse 85   Ht 5\' 1"  (1.549 m)   Wt 267 lb 1.9 oz (121.2 kg)   SpO2 95%   BMI 50.47 kg/m  Wt Readings from Last 3 Encounters:  02/23/23 267 lb 1.9 oz (121.2 kg)  01/25/23 265 lb 1.9 oz (120.3 kg)  01/19/23 260 lb (117.9 kg)    Lab Results  Component Value Date   TSH 0.611 08/24/2022   Lab Results  Component Value Date   WBC 6.3 08/24/2022   HGB 11.3 08/24/2022   HCT 36.2 08/24/2022   MCV 79 08/24/2022   PLT 191 08/24/2022   Lab Results  Component Value Date   NA 142 08/24/2022   K 4.0 08/24/2022   CO2 24 08/24/2022   GLUCOSE 156 (H)  08/24/2022   BUN 16 08/24/2022   CREATININE 1.10 (H) 08/24/2022   BILITOT <0.2 08/24/2022   ALKPHOS 124 (H) 08/24/2022   AST 10 08/24/2022   ALT 15 08/24/2022   PROT 6.3 08/24/2022   ALBUMIN 3.7 (L) 08/24/2022   CALCIUM 9.3 08/24/2022   ANIONGAP 9 04/25/2020   EGFR 58 (L) 08/24/2022   Lab Results  Component Value Date   CHOL 170 08/24/2022   Lab Results  Component Value Date   HDL 61 08/24/2022   Lab Results  Component Value Date   LDLCALC 93 08/24/2022   Lab Results  Component Value Date   TRIG 85 08/24/2022   Lab Results  Component Value Date   CHOLHDL 2.8 08/24/2022   Lab Results  Component Value Date   HGBA1C 6.6 (H) 08/24/2022      Assessment & Plan:  Primary hypertension Assessment & Plan: Control in the clinic Reports ambulatory readings in the 170 systolic and 80s diastolic The patient takes olmesartan-amlodipine-hydrochlorothiazide 40-10-25 daily Will add propranolol 10 mg daily Encouraged to continue to repeat BP greater than 140/90 Reviewed symptomatic with dose change Encouraged low-sodium diet with increased physical activity Will follow up in 4 weeks BP Readings from Last 3 Encounters:  02/23/23 138/86  01/25/23 (!) 142/82  01/19/23 (!) 142/90     Orders: -     Propranolol HCl; Take 1 tablet (10 mg total) by mouth daily.  Dispense: 30 tablet; Refill:  1  Colon cancer screening -     Ambulatory referral to Gastroenterology  Chronic right-sided low back pain with right-sided sciatica Assessment & Plan: Stable no complaints or concerns voiced     Follow-up: Return in about 1 month (around 03/26/2023).   Gilmore Laroche, FNP

## 2023-02-23 NOTE — Assessment & Plan Note (Signed)
Control in the clinic Reports ambulatory readings in the 170 systolic and 80s diastolic The patient takes olmesartan-amlodipine-hydrochlorothiazide 40-10-25 daily Will add propranolol 10 mg daily Encouraged to continue to repeat BP greater than 140/90 Reviewed symptomatic with dose change Encouraged low-sodium diet with increased physical activity Will follow up in 4 weeks BP Readings from Last 3 Encounters:  02/23/23 138/86  01/25/23 (!) 142/82  01/19/23 (!) 142/90

## 2023-02-25 ENCOUNTER — Other Ambulatory Visit: Payer: Self-pay | Admitting: Family Medicine

## 2023-02-25 DIAGNOSIS — E119 Type 2 diabetes mellitus without complications: Secondary | ICD-10-CM

## 2023-03-02 ENCOUNTER — Other Ambulatory Visit: Payer: 59

## 2023-03-04 DIAGNOSIS — E118 Type 2 diabetes mellitus with unspecified complications: Secondary | ICD-10-CM | POA: Diagnosis not present

## 2023-03-08 ENCOUNTER — Ambulatory Visit (HOSPITAL_COMMUNITY): Payer: 59 | Attending: Physical Therapy | Admitting: Physical Therapy

## 2023-03-10 ENCOUNTER — Other Ambulatory Visit: Payer: 59

## 2023-03-13 DIAGNOSIS — E118 Type 2 diabetes mellitus with unspecified complications: Secondary | ICD-10-CM | POA: Diagnosis not present

## 2023-03-14 ENCOUNTER — Telehealth: Payer: Self-pay | Admitting: Family Medicine

## 2023-03-14 ENCOUNTER — Other Ambulatory Visit: Payer: Self-pay | Admitting: Family Medicine

## 2023-03-14 DIAGNOSIS — I10 Essential (primary) hypertension: Secondary | ICD-10-CM

## 2023-03-14 MED ORDER — OLMESARTAN-AMLODIPINE-HCTZ 40-10-25 MG PO TABS: 1.0000 | ORAL_TABLET | Freq: Every day | ORAL | 1 refills | Status: DC

## 2023-03-15 NOTE — Telephone Encounter (Signed)
Pt informed

## 2023-03-25 ENCOUNTER — Telehealth: Payer: 59

## 2023-03-26 ENCOUNTER — Other Ambulatory Visit: Payer: Self-pay | Admitting: Family Medicine

## 2023-03-26 DIAGNOSIS — E119 Type 2 diabetes mellitus without complications: Secondary | ICD-10-CM

## 2023-03-29 ENCOUNTER — Ambulatory Visit: Payer: 59 | Admitting: Family Medicine

## 2023-03-31 ENCOUNTER — Other Ambulatory Visit: Payer: 59 | Admitting: *Deleted

## 2023-03-31 ENCOUNTER — Telehealth: Payer: Self-pay | Admitting: *Deleted

## 2023-03-31 ENCOUNTER — Telehealth: Payer: 59

## 2023-03-31 NOTE — Patient Outreach (Signed)
   Care Management RN Visit Note   03/31/23 Name: Gwendolyn Woodward MRN: [id number]      DOB: 02/07/1966  Subjective: Gwendolyn Woodward is a 57 y.o. year old female who is a primary care patient of Gilmore Laroche FNP.  Telephone call to patient for follow up, no answer to telephone, left voicemail requesting return phone call, (1st unsuccessful outreach attempt).     Plan:Telephone follow up appointment with care management team member scheduled for:  upon care guide rescheduling.  Irving Shows [facility], BSN [place]/ Ambulatory Care Management [phone]

## 2023-04-01 ENCOUNTER — Encounter: Payer: Self-pay | Admitting: Family Medicine

## 2023-04-11 DIAGNOSIS — H5203 Hypermetropia, bilateral: Secondary | ICD-10-CM | POA: Diagnosis not present

## 2023-04-11 DIAGNOSIS — E119 Type 2 diabetes mellitus without complications: Secondary | ICD-10-CM | POA: Diagnosis not present

## 2023-04-11 DIAGNOSIS — H524 Presbyopia: Secondary | ICD-10-CM | POA: Diagnosis not present

## 2023-04-11 LAB — HM DIABETES EYE EXAM

## 2023-04-12 DIAGNOSIS — E118 Type 2 diabetes mellitus with unspecified complications: Secondary | ICD-10-CM | POA: Diagnosis not present

## 2023-04-18 ENCOUNTER — Ambulatory Visit (INDEPENDENT_AMBULATORY_CARE_PROVIDER_SITE_OTHER): Payer: 59 | Admitting: Family Medicine

## 2023-04-18 ENCOUNTER — Telehealth: Payer: Self-pay | Admitting: Family Medicine

## 2023-04-18 ENCOUNTER — Encounter: Payer: Self-pay | Admitting: Family Medicine

## 2023-04-18 VITALS — BP 178/126 | HR 83 | Ht 61.0 in | Wt 265.1 lb

## 2023-04-18 DIAGNOSIS — E7849 Other hyperlipidemia: Secondary | ICD-10-CM | POA: Diagnosis not present

## 2023-04-18 DIAGNOSIS — I169 Hypertensive crisis, unspecified: Secondary | ICD-10-CM | POA: Insufficient documentation

## 2023-04-18 DIAGNOSIS — G4730 Sleep apnea, unspecified: Secondary | ICD-10-CM | POA: Diagnosis not present

## 2023-04-18 DIAGNOSIS — E038 Other specified hypothyroidism: Secondary | ICD-10-CM

## 2023-04-18 DIAGNOSIS — E559 Vitamin D deficiency, unspecified: Secondary | ICD-10-CM

## 2023-04-18 DIAGNOSIS — R7301 Impaired fasting glucose: Secondary | ICD-10-CM | POA: Diagnosis not present

## 2023-04-18 MED ORDER — HYDRALAZINE HCL 10 MG PO TABS
10.0000 mg | ORAL_TABLET | Freq: Every day | ORAL | 1 refills | Status: AC
Start: 2023-04-18 — End: ?

## 2023-04-18 NOTE — Telephone Encounter (Signed)
error 

## 2023-04-18 NOTE — Assessment & Plan Note (Addendum)
The patient has a history of sleep apnea and reports having undergone testing at a [facility] a year ago, but she never received a CPAP machine. She now complains of difficulty falling and staying asleep at night, noting frequent awakenings to use the restroom. These issues could potentially be contributing to her elevated blood pressure. I will place orders for another sleep study and recommend a review of sleep hygiene practices.

## 2023-04-18 NOTE — Assessment & Plan Note (Addendum)
The patient's blood pressure in the clinic is recorded at 178/126, accompanied by symptoms of headaches, dizziness, and lightheadedness. She reports ambulatory readings in the 170s systolic and 119 diastolic. The patient is currently prescribed olmesartan amlodipine hydrochlorothiazide 40-10-25 mg daily and was also prescribed propranolol 10 mg daily at her previous visit. However, she reports that the pharmacy never instructed her to pick up the new prescription. The patient is uncertain about her medication regimen, indicating she picks up and takes whatever medications are given to her by the pharmacy. Compliance with her current treatment is uncertain.  We will discontinue propranolol 10 mg daily and initiate treatment with hydralazine 10 mg daily, with close follow-up scheduled in one month. The patient is encouraged to seek urgent care at the emergency department if her blood pressure surges to a hypertensive crisis level. An EKG shows normal sinus rhythm.  BP Readings from Last 3 Encounters:  04/18/23 (!) 178/126  02/23/23 138/86  01/25/23 (!) 142/82

## 2023-04-18 NOTE — Progress Notes (Signed)
Established Patient Office Visit  Subjective:  Patient ID: Gwendolyn Woodward, female    DOB: 1966/08/02  Age: 57 y.o. MRN: [id number]  CC:  Chief Complaint  Patient presents with   Insomnia    Patient reports sleeping during the day and unable to sleep at night, requesting A1c check     HPI Gwendolyn Woodward is a 57 y.o. female with past medical history of hypertension, type 2 diabetes and sleep disturbance presents for f/u of  chronic medical conditions. For the details of today's visit, please refer to the assessment and plan.     Past Medical History:  Diagnosis Date   Arthritis    back pain and knee pain, no meds   Asthma    Chest pain    + dyspnea   CVA (cerebral infarction) 1984 , 1988   x2, left sided weakness, history of dizziness   Depression with anxiety    History- no meds   Dysphagia    Fasting hyperglycemia    Gastroesophageal reflux disease    no meds   Headache(784.0)    OTC med PRN   History of anemia    S/p transfusions in 1999 at [facility] after vaginal bleeding; normal CBC and 09/2011   Hyperlipidemia    lipid profile in 11/2011: 203, 106, 58, 124   Hypertension    Lab 09/2011: Normal CMet except glucose of 109-169 and albumin-3.4; normal BP 09/2011-12/2011   Sleep apnea    Does not use CPAP - no longer has CPAP machine   Stroke [facility])     Past Surgical History:  Procedure Laterality Date   CESAREAN SECTION  1984, 1987   x 2   COLONOSCOPY  01/27/2012   Procedure: COLONOSCOPY;  Surgeon: West Bali, MD;  Location: [facility];  Service: Endoscopy;  Laterality: N/A;  8:30   ENDOMETRIAL ABLATION  Feb 2013   ESOPHAGOGASTRODUODENOSCOPY  5/11   [facility] Eliott Nine GERD distal esophagus, NEGATIVE bx for Barretts   EXTERNAL FIXATION LEG Right 04/19/2020   Procedure: EXTERNAL FIXATION RIGHT ANKLE;  Surgeon: Kathryne Hitch, MD;  Location: [facility] OR;  Service: Orthopedics;  Laterality: Right;   IUD REMOVAL  10/20/2011   Procedure: INTRAUTERINE  DEVICE (IUD) REMOVAL;  Surgeon: Leslie Andrea, MD;  Location: [facility] ORS;  Service: Gynecology;  Laterality: N/A;   OPEN REDUCTION INTERNAL FIXATION (ORIF) TIBIA/FIBULA FRACTURE Right 04/23/2020   Procedure: OPEN REDUCTION INTERNAL FIXATION (ORIF) TIBIA/FIBULA FRACTURE;  Surgeon: Roby Lofts, MD;  Location: [facility] OR;  Service: Orthopedics;  Laterality: Right;   TUBAL LIGATION     WISDOM TOOTH EXTRACTION      Family History  Problem Relation Age of Onset   Cancer Father        Lung/Throat   Heart disease Father    Hypertension Mother    Asthma Mother    Diabetes Maternal Uncle    Anesthesia problems Neg Hx     Social History   Socioeconomic History   Marital status: Divorced    Spouse name: Not on file   Number of children: 2   Years of education: Not on file   Highest education level: Not on file  Occupational History   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  Tobacco Use   Smoking status: Never   Smokeless tobacco: Never  Vaping Use   Vaping status: Never Used  Substance and Sexual Activity   Alcohol use: Not  Currently    Comment: Socially, 2 times per yr   Drug use: No   Sexual activity: Yes    Birth control/protection: Surgical    Comment: tubal & ablation  Other Topics Concern   Not on file  Social History Narrative   Lives alone-2 grown children   Social Determinants of Health   Financial Resource Strain: Low Risk  (01/19/2023)   Overall Financial Resource Strain (CARDIA)    Difficulty of Paying Living Expenses: Not hard at all  Food Insecurity: No Food Insecurity (01/19/2023)   Hunger Vital Sign    Worried About Running Out of Food in the Last Year: Never true    Ran Out of Food in the Last Year: Never true  Transportation Needs: No Transportation Needs (01/19/2023)   PRAPARE - Administrator, Civil Service (Medical): No    Lack of Transportation (Non-Medical): No  Physical Activity: Sufficiently Active (01/19/2023)    Exercise Vital Sign    Days of Exercise per Week: 7 days    Minutes of Exercise per Session: 30 min  Stress: No Stress Concern Present (01/19/2023)   [employer] of Occupational Health - Occupational Stress Questionnaire    Feeling of Stress : Not at all  Social Connections: Socially Isolated (01/19/2023)   Social Connection and Isolation Panel [NHANES]    Frequency of Communication with Friends and Family: Once a week    Frequency of Social Gatherings with Friends and Family: Never    Attends Religious Services: More than 4 times per year    Active Member of [employer] or Organizations: No    Attends Banker Meetings: Never    Marital Status: Divorced  Catering manager Violence: Not At Risk (01/19/2023)   Humiliation, Afraid, Rape, and Kick questionnaire    Fear of Current or Ex-Partner: No    Emotionally Abused: No    Physically Abused: No    Sexually Abused: No    Outpatient Medications Prior to Visit  Medication Sig Dispense Refill   ACCU-CHEK GUIDE test strip      Accu-Chek Softclix Lancets lancets Once daily testing dx e11.9 50 each 1   atorvastatin (LIPITOR) 40 MG tablet Take 1 tablet (40 mg total) by mouth at bedtime. 30 tablet 11   brompheniramine-pseudoephedrine-DM 30-2-10 MG/5ML syrup Take 5 mLs by mouth 4 (four) times daily as needed. 120 mL 0   Cholecalciferol (VITAMIN D3) 25 MCG (1000 UT) CAPS Take 1 capsule (1,000 Units total) by mouth daily. 30 capsule 3   Cyanocobalamin (VITAMIN B12 PO) Take by mouth. Once daily     escitalopram (LEXAPRO) 10 MG tablet Take 1 tablet (10 mg total) by mouth daily. 30 tablet 0   gabapentin (NEURONTIN) 300 MG capsule Take 1 capsule (300 mg total) by mouth daily. 30 capsule 0   lidocaine (XYLOCAINE) 2 % solution Use as directed 15 mLs in the mouth or throat as needed for mouth pain. 100 mL 0   metFORMIN (GLUCOPHAGE-XR) 500 MG 24 hr tablet Take 2 tablets (1,000 mg total) by mouth 2 (two) times daily with a meal. 180 tablet 1    methocarbamol (ROBAXIN) 500 MG tablet Take 1 tablet (500 mg total) by mouth at bedtime as needed for muscle spasms. 60 tablet 1   Olmesartan-amLODIPine-HCTZ 40-10-25 MG TABS Take 1 tablet by mouth daily. 90 tablet 1   OZEMPIC, 0.25 OR 0.5 MG/DOSE, 2 MG/3ML SOPN INJECT 0.5 MG INTO THE SKIN ONCE A WEEK 3 mL 0   traZODone (  DESYREL) 100 MG tablet Take 100 mg by mouth at bedtime.     propranolol (INDERAL) 10 MG tablet Take 1 tablet (10 mg total) by mouth daily. 30 tablet 1   No facility-administered medications prior to visit.    Allergies  Allergen Reactions   Aspirin     INTERNAL BLEEDING   Tramadol Itching    ROS Review of Systems  Constitutional:  Positive for fatigue. Negative for chills and fever.  Eyes:  Negative for visual disturbance.  Respiratory:  Negative for chest tightness and shortness of breath.   Neurological:  Positive for dizziness and headaches.  Psychiatric/Behavioral:  Positive for sleep disturbance.       Objective:    Physical Exam HENT:     Head: Normocephalic.     Mouth/Throat:     Mouth: Mucous membranes are moist.  Cardiovascular:     Rate and Rhythm: Normal rate.     Heart sounds: Normal heart sounds.  Pulmonary:     Effort: Pulmonary effort is normal.     Breath sounds: Normal breath sounds.  Neurological:     Mental Status: She is alert.     BP (!) 178/126 (BP Location: Left Arm, Patient Position: Sitting, Cuff Size: Large)   Pulse 83   Ht 5\' 1"  (1.549 m)   Wt 265 lb 1.3 oz (120.2 kg)   SpO2 95%   BMI 50.09 kg/m  Wt Readings from Last 3 Encounters:  04/18/23 265 lb 1.3 oz (120.2 kg)  02/23/23 267 lb 1.9 oz (121.2 kg)  01/25/23 265 lb 1.9 oz (120.3 kg)    Lab Results  Component Value Date   TSH 0.611 08/24/2022   Lab Results  Component Value Date   WBC 6.3 08/24/2022   HGB 11.3 08/24/2022   HCT 36.2 08/24/2022   MCV 79 08/24/2022   PLT 191 08/24/2022   Lab Results  Component Value Date   NA 142 08/24/2022   K 4.0  08/24/2022   CO2 24 08/24/2022   GLUCOSE 156 (H) 08/24/2022   BUN 16 08/24/2022   CREATININE 1.10 (H) 08/24/2022   BILITOT <0.2 08/24/2022   ALKPHOS 124 (H) 08/24/2022   AST 10 08/24/2022   ALT 15 08/24/2022   PROT 6.3 08/24/2022   ALBUMIN 3.7 (L) 08/24/2022   CALCIUM 9.3 08/24/2022   ANIONGAP 9 04/25/2020   EGFR 58 (L) 08/24/2022   Lab Results  Component Value Date   CHOL 170 08/24/2022   Lab Results  Component Value Date   HDL 61 08/24/2022   Lab Results  Component Value Date   LDLCALC 93 08/24/2022   Lab Results  Component Value Date   TRIG 85 08/24/2022   Lab Results  Component Value Date   CHOLHDL 2.8 08/24/2022   Lab Results  Component Value Date   HGBA1C 6.6 (H) 08/24/2022      Assessment & Plan:  Hypertensive crisis Assessment & Plan: The patient's blood pressure in the clinic is recorded at 178/126, accompanied by symptoms of headaches, dizziness, and lightheadedness. She reports ambulatory readings in the 170s systolic and 119 diastolic. The patient is currently prescribed olmesartan amlodipine hydrochlorothiazide 40-10-25 mg daily and was also prescribed propranolol 10 mg daily at her previous visit. However, she reports that the pharmacy never instructed her to pick up the new prescription. The patient is uncertain about her medication regimen, indicating she picks up and takes whatever medications are given to her by the pharmacy. Compliance with her current treatment is uncertain.  We will discontinue propranolol 10 mg daily and initiate treatment with hydralazine 10 mg daily, with close follow-up scheduled in one month. The patient is encouraged to seek urgent care at the emergency department if her blood pressure surges to a hypertensive crisis level. An EKG shows normal sinus rhythm.  BP Readings from Last 3 Encounters:  04/18/23 (!) 178/126  02/23/23 138/86  01/25/23 (!) 142/82      Orders: -     hydrALAZINE HCl; Take 1 tablet (10 mg total) by  mouth daily.  Dispense: 90 tablet; Refill: 1 -     AMB Referral to Pharmacy Medication Management -     EKG 12-Lead  Sleep apnea, unspecified type Assessment & Plan: The patient has a history of sleep apnea and reports having undergone testing at a [facility] a year ago, but she never received a CPAP machine. She now complains of difficulty falling and staying asleep at night, noting frequent awakenings to use the restroom. These issues could potentially be contributing to her elevated blood pressure. I will place orders for another sleep study and recommend a review of sleep hygiene practices.   Orders: -     Ambulatory referral to Sleep Studies  IFG (impaired fasting glucose) -     Hemoglobin A1c  Vitamin D deficiency -     VITAMIN D 25 Hydroxy (Vit-D Deficiency, Fractures)  Other specified hypothyroidism -     TSH + free T4  Other hyperlipidemia -     Lipid panel -     CMP14+EGFR -     CBC with Differential/Platelet  Note: This chart has been completed using Engineer, civil (consulting) software, and while attempts have been made to ensure accuracy, certain words and phrases may not be transcribed as intended.    Follow-up: Return in about 1 month (around 05/19/2023) for BP.   Gilmore Laroche, FNP

## 2023-04-18 NOTE — Patient Instructions (Addendum)
I appreciate the opportunity to provide care to you today!    Follow up:  1 month  Labs: please stop by the lab today to get your blood drawn (CBC, CMP, TSH, Lipid profile, HgA1c, Vit D)  Your blood pressure reading at the clinic suggests a hypertensive crisis. I recommend seeking urgent care at the [facility] Emergency Department.  Please take Olmesartan-Amlodipine-Hydrochlorothiazide 40-10-25 mg AND Hydralazine 10 mg daily. Alongside this medication regimen, it is crucial to reduce sodium intake and increase your consumption of fruits and vegetables, coupled with enhanced physical activity. I want you to achieve and maintain a blood pressure below 140/80 mmHg.  Non-Pharmacological Management for Sleep Hygiene:  Establish a Consistent Bedtime Routine: -Develop and adhere to a regular sleep and wake schedule. -Avoid using electronic devices, including computers and smartphones, at least one hour before bedtime. -If unable to fall asleep within 15 minutes, refrain from staying in bed and engage in a relaxing activity until you feel sleepy. Reduce Daily Stress: -Engage in stress-reducing activities before bedtime to help relax your mind and body. Avoid intense physical exercise and stimulant use, such as caffeine, late in the day. Optimize Sleep Environment: -Use the bed and bedroom exclusively for sleep and intimate activities. -Consider removing electronic devices from the sleeping area and limit screen time prior to bedtime. Incorporate Relaxation Techniques: -Practice abdominal breathing and meditation to promote relaxation. Utilize progressive muscle relaxation and visualization techniques to aid in achieving restful sleep.  Referrals today-  sleep studies and pharmacy for medication management  Attached with your AVS, you will find valuable resources for self-education. I highly recommend dedicating some time to thoroughly examine them.   Please continue to a heart-healthy diet and  increase your physical activities. Try to exercise for at least five days a week.    It was a pleasure to see you and I look forward to continuing to work together on your health and well-being. Please do not hesitate to call the office if you need care or have questions about your care.  In case of emergency, please visit the Emergency Department for urgent care, or contact our clinic at [phone] to schedule an appointment. We're here to help you!   Have a wonderful day and week. With Gratitude, Gilmore Laroche MSN, FNP-BC

## 2023-04-19 ENCOUNTER — Encounter: Payer: Self-pay | Admitting: Family Medicine

## 2023-04-19 LAB — CBC WITH DIFFERENTIAL/PLATELET
Basophils Absolute: 0 10*3/uL (ref 0.0–0.2)
Basos: 0 %
EOS (ABSOLUTE): 0.1 10*3/uL (ref 0.0–0.4)
Eos: 2 %
Hematocrit: 43 % (ref 34.0–46.6)
Hemoglobin: 13.2 g/dL (ref 11.1–15.9)
Immature Grans (Abs): 0 10*3/uL (ref 0.0–0.1)
Immature Granulocytes: 0 %
Lymphocytes Absolute: 2.4 10*3/uL (ref 0.7–3.1)
Lymphs: 41 %
MCH: 24.7 pg — ABNORMAL LOW (ref 26.6–33.0)
MCHC: 30.7 g/dL — ABNORMAL LOW (ref 31.5–35.7)
MCV: 81 fL (ref 79–97)
Monocytes Absolute: 0.4 10*3/uL (ref 0.1–0.9)
Monocytes: 7 %
Neutrophils Absolute: 2.9 10*3/uL (ref 1.4–7.0)
Neutrophils: 50 %
Platelets: 204 10*3/uL (ref 150–450)
RBC: 5.34 x10E6/uL — ABNORMAL HIGH (ref 3.77–5.28)
RDW: 15.7 % — ABNORMAL HIGH (ref 11.7–15.4)
WBC: 5.8 10*3/uL (ref 3.4–10.8)

## 2023-04-19 LAB — TSH+FREE T4
Free T4: 1.07 ng/dL (ref 0.82–1.77)
TSH: 0.793 u[IU]/mL (ref 0.450–4.500)

## 2023-04-19 LAB — CMP14+EGFR
ALT: 13 IU/L (ref 0–32)
AST: 11 IU/L (ref 0–40)
Albumin: 4.3 g/dL (ref 3.8–4.9)
Alkaline Phosphatase: 140 IU/L — ABNORMAL HIGH (ref 44–121)
BUN/Creatinine Ratio: 17 (ref 9–23)
BUN: 17 mg/dL (ref 6–24)
Bilirubin Total: 0.2 mg/dL (ref 0.0–1.2)
CO2: 25 mmol/L (ref 20–29)
Calcium: 9.7 mg/dL (ref 8.7–10.2)
Chloride: 105 mmol/L (ref 96–106)
Creatinine, Ser: 1 mg/dL (ref 0.57–1.00)
Globulin, Total: 2.8 g/dL (ref 1.5–4.5)
Glucose: 108 mg/dL — ABNORMAL HIGH (ref 70–99)
Potassium: 4.5 mmol/L (ref 3.5–5.2)
Sodium: 144 mmol/L (ref 134–144)
Total Protein: 7.1 g/dL (ref 6.0–8.5)
eGFR: 65 mL/min/{1.73_m2} (ref >59)

## 2023-04-19 LAB — LIPID PANEL
Chol/HDL Ratio: 3 ratio (ref 0.0–4.4)
Cholesterol, Total: 201 mg/dL — ABNORMAL HIGH (ref 100–199)
HDL: 68 mg/dL (ref >39)
LDL Chol Calc (NIH): 116 mg/dL — ABNORMAL HIGH (ref 0–99)
Triglycerides: 93 mg/dL (ref 0–149)
VLDL Cholesterol Cal: 17 mg/dL (ref 5–40)

## 2023-04-19 LAB — HEMOGLOBIN A1C
Est. average glucose Bld gHb Est-mCnc: 140 mg/dL
Hgb A1c MFr Bld: 6.5 % — ABNORMAL HIGH (ref 4.8–5.6)

## 2023-04-19 LAB — VITAMIN D 25 HYDROXY (VIT D DEFICIENCY, FRACTURES): Vit D, 25-Hydroxy: 27 ng/mL — ABNORMAL LOW (ref 30.0–100.0)

## 2023-04-20 ENCOUNTER — Other Ambulatory Visit: Payer: Self-pay | Admitting: Family Medicine

## 2023-04-20 DIAGNOSIS — E559 Vitamin D deficiency, unspecified: Secondary | ICD-10-CM

## 2023-04-20 DIAGNOSIS — E7849 Other hyperlipidemia: Secondary | ICD-10-CM

## 2023-04-20 MED ORDER — VITAMIN D3 25 MCG (1000 UT) PO CAPS
1000.0000 [IU] | ORAL_CAPSULE | Freq: Every day | ORAL | 3 refills | Status: DC
Start: 2023-04-20 — End: 2023-07-04

## 2023-04-20 MED ORDER — ATORVASTATIN CALCIUM 80 MG PO TABS: 80.0000 mg | ORAL_TABLET | Freq: Every day | ORAL | 3 refills | Status: DC

## 2023-04-20 NOTE — Progress Notes (Signed)
Please inform the patient that her vitamin D levels are low, and a refill for vitamin D at 1000 IU daily has been sent to her pharmacy. Additionally, her cholesterol levels are elevated, and I aim for her LDL to be below 50. I have increased her Lipitor dosage to 80 mg daily, and this prescription has also been sent to her pharmacy. I recommend reducing her intake of fatty, starchy, and greasy foods while increasing physical activity. Her hemoglobin A1c is 6.5; therefore, she should decrease her consumption of high-sugar foods and beverages and continue with her current treatment regimen.

## 2023-04-20 NOTE — Progress Notes (Signed)
The ASCVD Risk score (Arnett DK, et al., 2019) failed to calculate for the following reasons:   The patient has a prior MI or stroke diagnosis  

## 2023-04-27 ENCOUNTER — Other Ambulatory Visit: Payer: 59 | Admitting: *Deleted

## 2023-04-27 ENCOUNTER — Ambulatory Visit (HOSPITAL_COMMUNITY): Payer: 59

## 2023-04-27 ENCOUNTER — Encounter: Payer: Self-pay | Admitting: *Deleted

## 2023-04-27 NOTE — Patient Instructions (Signed)
Visit Information  Thank you for taking time to visit with me today. Please don't hesitate to contact me if I can be of assistance to you before our next scheduled telephone appointment.  Following are the goals we discussed today:   Goals Addressed             This Visit's Progress    CCM (DIABETES) EXPECTED OUTCOME:  MONITOR, SELF-MANAGE AND REDUCE SYMPTOMS OF DIABETES       Current Barriers:  Knowledge Deficits related to Diabetes management Chronic Disease Management support and education needs related to Diabetes, diet Patient reports checks CBG once daily fasting with readings in 96-120 range, AIC on 04/18/23 6.5 Patient reports she does not always adhere to special diet, sometimes drinks sugary soft drinks and eats crackers, reports she is trying to drink mostly water Patient reports she has had 2 falls this past year Patient reports she exercises at senior center and plans to try water aerobics at [facility]  Planned Interventions: Reviewed medications with patient and discussed importance of medication adherence;        Counseled on importance of regular laboratory monitoring as prescribed;        Discussed plans with patient for ongoing care management follow up and provided patient with direct contact information for care management team;      Advised patient, providing education and rationale, to check cbg once daily and record        call provider for findings outside established parameters;       Review of patient status, including review of consultants reports, relevant laboratory and other test results, and medications completed;       Advised patient to discuss any issues with blood sugar, medications with provider;      Reinforced importance of exercise/ walking and water aerobics (pt interested in) for management of Diabetes Reinforced instructions given by primary care provider- drink at least 64 ounce of fluid daily Reviewed importance of taking Vitamin D and all  medications as prescribed Reinforced carbohydrate modified diet  Symptom Management: Take medications as prescribed   Attend all scheduled provider appointments Call pharmacy for medication refills 3-7 days in advance of running out of medications Attend church or other social activities Perform all self care activities independently  Perform IADL's (shopping, preparing meals, housekeeping, managing finances) independently Call provider office for new concerns or questions  check blood sugar at prescribed times: once daily check feet daily for cuts, sores or redness enter blood sugar readings and medication or insulin into daily log take the blood sugar log to all doctor visits take the blood sugar meter to all doctor visits trim toenails straight across drink 6 to 8 glasses of water each day eat fish at least once per week fill half of plate with vegetables limit fast food meals to no more than 1 per week manage portion size prepare main meal at home 3 to 5 days each week read food labels for fat, fiber, carbohydrates and portion size wash and dry feet carefully every day wear comfortable, cotton socks wear comfortable, well-fitting shoes Take vitamin D as prescribed Drink at least 64 ounces of fluid daily fall prevention strategies: change position slowly, use assistive device such as walker or cane (per provider recommendations) when walking, keep walkways clear, have good lighting in room. It is important to contact your provider if you have any falls, maintain muscle strength/tone by exercise per provider recommendations.  Follow Up Plan: Telephone follow up appointment with care  management team member scheduled for:   06/16/23 at 215 pm       CCM (HYPERTENSION) EXPECTED OUTCOME: MONITOR, SELF-MANAGE AND REDUCE SYMPTOMS OF HYPERTENSION       Current Barriers:  Knowledge Deficits related to Hypertension management Chronic Disease Management support and education needs related  to Hypertension, diet No Advanced Directives in place- already has information at home Patient reports lives alone, is independent in all aspects of her care, has adult daughter she can call of if needed Patient reports she is walking daily at [employer] store Patient reports she is checking blood pressure daily and keeping a log as instructed by primary care provider due to blood pressure being elevated, states " it's still high", pt reports she is taking hydralazine but is not taking olmesartan/ amlodipine/ hydrochlorothiazide and does not have on hand Patient states she has prefilled medication packs and this works very well for her Patient reports she has been helping baby sit her grandchildren  Planned Interventions: Evaluation of current treatment plan related to hypertension self management and patient's adherence to plan as established by provider;   Reviewed medications with patient and discussed importance of compliance;  Counseled on the importance of exercise goals with target of 150 minutes per week Advised patient, providing education and rationale, to monitor blood pressure daily and record, calling PCP for findings outside established parameters;  Advised patient to discuss any issues with blood pressure, medications with provider; Discussed complications of poorly controlled blood pressure such as heart disease, stroke, circulatory complications, vision complications, kidney impairment, sexual dysfunction;  Pain assessment completed Reinforced low sodium diet and importance of reading labels for sodium content Reinforced importance of continuing to exercise and using bands Reviewed importance of checking blood pressure daily and keeping a log and bringing log to primary care provider appointments Reviewed normal parameters for blood pressure In basket message sent to primary care provider reporting pt has hydralazine on hand and is taking but does not have olmesartan/ amlodipine/  hydrochlorothiazide on hand and not taking, ask for clarification of what pt is to take  Symptom Management: Take medications as prescribed   Attend all scheduled provider appointments Call pharmacy for medication refills 3-7 days in advance of running out of medications Attend church or other social activities Perform all self care activities independently  Perform IADL's (shopping, preparing meals, housekeeping, managing finances) independently Call provider office for new concerns or questions  check blood pressure 3 times per week choose a place to take my blood pressure (home, clinic or office, retail store) write blood pressure results in a log or diary learn about high blood pressure keep a blood pressure log take blood pressure log to all doctor appointments call doctor for signs and symptoms of high blood pressure keep all doctor appointments take medications for blood pressure exactly as prescribed report new symptoms to your doctor eat more whole grains, fruits and vegetables, lean meats and healthy fats Follow low sodium diet- read food labels for sodium content Continue exercising / walking Try to limit sugary soft drinks, choose unsweetened tea, water Continue using your bands at home It is very important that you check blood pressure daily and keep a log, take to doctor's appointment  Follow Up Plan: Telephone follow up appointment with care management team member scheduled for:   06/16/23 at 215 pm           Our next appointment is by telephone on 06/16/23 at 215 pm  Please call the care guide  team at [phone] if you need to cancel or reschedule your appointment.   If you are experiencing a Mental Health or Behavioral Health Crisis or need someone to talk to, please call the Suicide and Crisis Lifeline: 988 call the [place] National Suicide Prevention Lifeline: (361)869-8966 or TTY: 726-174-5187 TTY 289-666-2039) to talk to a trained counselor call  1-800-273-TALK (toll free, 24 hour hotline) go to [facility] [address], [place] [phone]) call the [facility] Line: [phone] call 911   The patient verbalized understanding of instructions, educational materials, and care plan provided today and DECLINED offer to receive copy of patient instructions, educational materials, and care plan.   Telephone follow up appointment with care management team member scheduled for: 06/16/23 at 215 pm  Irving Shows [facility], BSN [place]/ Ambulatory Care Management [phone]

## 2023-04-27 NOTE — Patient Outreach (Signed)
Care Management   Visit Note  04/27/2023 Name: Gwendolyn Woodward MRN: [id number] DOB: 02-22-1966  Subjective: Gwendolyn Woodward is a 57 y.o. year old female who is a primary care patient of Gilmore Laroche, FNP. The Care Management team was consulted for assistance.      Engaged with patient spoke with patient by telephone for follow up   Goals Addressed             This Visit's Progress    CCM (DIABETES) EXPECTED OUTCOME:  MONITOR, SELF-MANAGE AND REDUCE SYMPTOMS OF DIABETES       Current Barriers:  Knowledge Deficits related to Diabetes management Chronic Disease Management support and education needs related to Diabetes, diet Patient reports checks CBG once daily fasting with readings in 96-120 range, AIC on 04/18/23 6.5 Patient reports she does not always adhere to special diet, sometimes drinks sugary soft drinks and eats crackers, reports she is trying to drink mostly water Patient reports she has had 2 falls this past year Patient reports she [facility] at senior center and plans to try water aerobics at [facility]  Planned Interventions: Reviewed medications with patient and discussed importance of medication adherence;        Counseled on importance of regular laboratory monitoring as prescribed;        Discussed plans with patient for ongoing care management follow up and provided patient with direct contact information for care management team;      Advised patient, providing education and rationale, to check cbg once daily and record        call provider for findings outside established parameters;       Review of patient status, including review of consultants reports, relevant laboratory and other test results, and medications completed;       Advised patient to discuss any issues with blood sugar, medications with provider;      Reinforced importance of exercise/ walking and water aerobics (pt interested in) for management of Diabetes Reinforced instructions given by primary  care provider- drink at least 64 ounce of fluid daily Reviewed importance of taking Vitamin D and all medications as prescribed Reinforced carbohydrate modified diet  Symptom Management: Take medications as prescribed   Attend all scheduled provider appointments Call pharmacy for medication refills 3-7 days in advance of running out of medications Attend church or other social activities Perform all self care activities independently  Perform IADL's (shopping, preparing meals, housekeeping, managing finances) independently Call provider office for new concerns or questions  check blood sugar at prescribed times: once daily check feet daily for cuts, sores or redness enter blood sugar readings and medication or insulin into daily log take the blood sugar log to all doctor visits take the blood sugar meter to all doctor visits trim toenails straight across drink 6 to 8 glasses of water each day eat fish at least once per week fill half of plate with vegetables limit fast food meals to no more than 1 per week manage portion size prepare main meal at home 3 to 5 days each week read food labels for fat, fiber, carbohydrates and portion size wash and dry feet carefully every day wear comfortable, cotton socks wear comfortable, well-fitting shoes Take vitamin D as prescribed Drink at least 64 ounces of fluid daily fall prevention strategies: change position slowly, use assistive device such as walker or cane (per provider recommendations) when walking, keep walkways clear, have good lighting in room. It is important to contact your provider if you  have any falls, maintain muscle strength/tone by exercise per provider recommendations.  Follow Up Plan: Telephone follow up appointment with care management team member scheduled for:   06/16/23 at 215 pm       CCM (HYPERTENSION) EXPECTED OUTCOME: MONITOR, SELF-MANAGE AND REDUCE SYMPTOMS OF HYPERTENSION       Current Barriers:  Knowledge  Deficits related to Hypertension management Chronic Disease Management support and education needs related to Hypertension, diet No Advanced Directives in place- already has information at home Patient reports lives alone, is independent in all aspects of her care, has adult daughter she can call of if needed Patient reports she is walking daily at [employer] store Patient reports she is checking blood pressure daily and keeping a log as instructed by primary care provider due to blood pressure being elevated, states " it's still high", pt reports she is taking hydralazine but is not taking olmesartan/ amlodipine/ hydrochlorothiazide and does not have on hand Patient states she has prefilled medication packs and this works very well for her Patient reports she has been helping baby sit her grandchildren  Planned Interventions: Evaluation of current treatment plan related to hypertension self management and patient's adherence to plan as established by provider;   Reviewed medications with patient and discussed importance of compliance;  Counseled on the importance of exercise goals with target of 150 minutes per week Advised patient, providing education and rationale, to monitor blood pressure daily and record, calling PCP for findings outside established parameters;  Advised patient to discuss any issues with blood pressure, medications with provider; Discussed complications of poorly controlled blood pressure such as heart disease, stroke, circulatory complications, vision complications, kidney impairment, sexual dysfunction;  Pain assessment completed Reinforced low sodium diet and importance of reading labels for sodium content Reinforced importance of continuing to exercise and using bands Reviewed importance of checking blood pressure daily and keeping a log and bringing log to primary care provider appointments Reviewed normal parameters for blood pressure In basket message sent to primary  care provider reporting pt has hydralazine on hand and is taking but does not have olmesartan/ amlodipine/ hydrochlorothiazide on hand and not taking, ask for clarification of what pt is to take  Symptom Management: Take medications as prescribed   Attend all scheduled provider appointments Call pharmacy for medication refills 3-7 days in advance of running out of medications Attend church or other social activities Perform all self care activities independently  Perform IADL's (shopping, preparing meals, housekeeping, managing finances) independently Call provider office for new concerns or questions  check blood pressure 3 times per week choose a place to take my blood pressure (home, clinic or office, retail store) write blood pressure results in a log or diary learn about high blood pressure keep a blood pressure log take blood pressure log to all doctor appointments call doctor for signs and symptoms of high blood pressure keep all doctor appointments take medications for blood pressure exactly as prescribed report new symptoms to your doctor eat more whole grains, fruits and vegetables, lean meats and healthy fats Follow low sodium diet- read food labels for sodium content Continue exercising / walking Try to limit sugary soft drinks, choose unsweetened tea, water Continue using your bands at home It is very important that you check blood pressure daily and keep a log, take to doctor's appointment  Follow Up Plan: Telephone follow up appointment with care management team member scheduled for:   06/16/23 at 215 pm  Plan: Telephone follow up appointment with care management team member scheduled for: 06/16/23 at 215 pm  Irving Shows [facility], BSN [place]/ Ambulatory Care Management [phone]

## 2023-04-28 ENCOUNTER — Other Ambulatory Visit: Payer: Self-pay

## 2023-04-28 ENCOUNTER — Ambulatory Visit (HOSPITAL_COMMUNITY): Payer: 59 | Attending: Family Medicine | Admitting: Physical Therapy

## 2023-04-28 ENCOUNTER — Other Ambulatory Visit: Payer: Self-pay | Admitting: *Deleted

## 2023-04-28 ENCOUNTER — Other Ambulatory Visit: Payer: Self-pay | Admitting: Family Medicine

## 2023-04-28 DIAGNOSIS — R2689 Other abnormalities of gait and mobility: Secondary | ICD-10-CM | POA: Insufficient documentation

## 2023-04-28 DIAGNOSIS — M6281 Muscle weakness (generalized): Secondary | ICD-10-CM | POA: Diagnosis present

## 2023-04-28 DIAGNOSIS — M5441 Lumbago with sciatica, right side: Secondary | ICD-10-CM | POA: Diagnosis not present

## 2023-04-28 DIAGNOSIS — M25511 Pain in right shoulder: Secondary | ICD-10-CM | POA: Diagnosis present

## 2023-04-28 DIAGNOSIS — M546 Pain in thoracic spine: Secondary | ICD-10-CM | POA: Insufficient documentation

## 2023-04-28 DIAGNOSIS — I10 Essential (primary) hypertension: Secondary | ICD-10-CM

## 2023-04-28 DIAGNOSIS — M25611 Stiffness of right shoulder, not elsewhere classified: Secondary | ICD-10-CM | POA: Diagnosis present

## 2023-04-28 DIAGNOSIS — G8929 Other chronic pain: Secondary | ICD-10-CM | POA: Insufficient documentation

## 2023-04-28 NOTE — Patient Outreach (Signed)
Care Management   Visit Note  04/28/2023 Name: Gwendolyn Woodward MRN: [id number] DOB: [birth year]  Subjective: Gwendolyn Woodward is a 57 y.o. year old female who is a primary care patient of Gilmore Laroche, FNP. The Care Management team was consulted for assistance.      Engaged with patient spoke with patient by telephone for follow up   Goals Addressed             This Visit's Progress    CCM (HYPERTENSION) EXPECTED OUTCOME: MONITOR, SELF-MANAGE AND REDUCE SYMPTOMS OF HYPERTENSION       Current Barriers:  Knowledge Deficits related to Hypertension management Chronic Disease Management support and education needs related to Hypertension, diet No Advanced Directives in place- already has information at home Patient reports lives alone, is independent in all aspects of her care, has adult daughter she can call of if needed Patient reports she is walking daily at [facility] store Patient reports she is checking blood pressure daily and keeping a log as instructed by primary care provider due to blood pressure being elevated, states " it's still high", pt reports she is taking hydralazine but is not taking olmesartan/ amlodipine/ hydrochlorothiazide and does not have on hand Patient states she has prefilled medication packs and this works very well for her Patient reports she has been helping baby sit her grandchildren 04/28/23- message received from primary care provider that pt is supposed to be taking hydralazine and olmesartan  Planned Interventions: Evaluation of current treatment plan related to hypertension self management and patient's adherence to plan as established by provider;   Reviewed medications with patient and discussed importance of compliance;  Counseled on the importance of exercise goals with target of 150 minutes per week Advised patient, providing education and rationale, to monitor blood pressure daily and record, calling PCP for findings outside established parameters;   Advised patient to discuss any issues with blood pressure, medications with provider; Discussed complications of poorly controlled blood pressure such as heart disease, stroke, circulatory complications, vision complications, kidney impairment, sexual dysfunction;  Pain assessment completed Reinforced low sodium diet and importance of reading labels for sodium content Reinforced importance of continuing to exercise and using bands Reviewed importance of checking blood pressure daily and keeping a log and bringing log to primary care provider appointments Reviewed normal parameters for blood pressure Telephone call to pt, spoke with pt and informed she is to take hydralazine and olmesartan, pt states she is otw out the door to attend outpatient PT and will stop by pharmacy and pickup olmesartan  Symptom Management: Take medications as prescribed   Attend all scheduled provider appointments Call pharmacy for medication refills 3-7 days in advance of running out of medications Attend church or other social activities Perform all self care activities independently  Perform IADL's (shopping, preparing meals, housekeeping, managing finances) independently Call provider office for new concerns or questions  check blood pressure 3 times per week choose a place to take my blood pressure (home, clinic or office, retail store) write blood pressure results in a log or diary learn about high blood pressure keep a blood pressure log take blood pressure log to all doctor appointments call doctor for signs and symptoms of high blood pressure keep all doctor appointments take medications for blood pressure exactly as prescribed report new symptoms to your doctor eat more whole grains, fruits and vegetables, lean meats and healthy fats Follow low sodium diet- read food labels for sodium content Continue exercising / walking Try  to limit sugary soft drinks, choose unsweetened tea, water Continue using  your bands at home It is very important that you check blood pressure daily and keep a log, take to doctor's appointment  Please take hydralazine and olmesartan/ amlodipine/ HCTZ  Follow Up Plan: Telephone follow up appointment with care management team member scheduled for:   06/16/23 at 215 pm           Plan: Telephone follow up appointment with care management team member scheduled for: 06/16/23 at 215 pm  Irving Shows [facility], BSN [place]/ Ambulatory Care Management [phone]
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
## 2023-04-28 NOTE — Patient Instructions (Signed)
Visit Information  Thank you for taking time to visit with me today. Please don't hesitate to contact me if I can be of assistance to you before our next scheduled telephone appointment.  Following are the goals we discussed today:   Goals Addressed             This Visit's Progress    CCM (HYPERTENSION) EXPECTED OUTCOME: MONITOR, SELF-MANAGE AND REDUCE SYMPTOMS OF HYPERTENSION       Current Barriers:  Knowledge Deficits related to Hypertension management Chronic Disease Management support and education needs related to Hypertension, diet No Advanced Directives in place- already has information at home Patient reports lives alone, is independent in all aspects of her care, has adult daughter she can call of if needed Patient reports she is walking daily at [facility] store Patient reports she is checking blood pressure daily and keeping a log as instructed by primary care provider due to blood pressure being elevated, states " it's still high", pt reports she is taking hydralazine but is not taking olmesartan/ amlodipine/ hydrochlorothiazide and does not have on hand Patient states she has prefilled medication packs and this works very well for her Patient reports she has been helping baby sit her grandchildren 04/28/23- message received from primary care provider that pt is supposed to be taking hydralazine and olmesartan  Planned Interventions: Evaluation of current treatment plan related to hypertension self management and patient's adherence to plan as established by provider;   Reviewed medications with patient and discussed importance of compliance;  Counseled on the importance of exercise goals with target of 150 minutes per week Advised patient, providing education and rationale, to monitor blood pressure daily and record, calling PCP for findings outside established parameters;  Advised patient to discuss any issues with blood pressure, medications with provider; Discussed  complications of poorly controlled blood pressure such as heart disease, stroke, circulatory complications, vision complications, kidney impairment, sexual dysfunction;  Pain assessment completed Reinforced low sodium diet and importance of reading labels for sodium content Reinforced importance of continuing to exercise and using bands Reviewed importance of checking blood pressure daily and keeping a log and bringing log to primary care provider appointments Reviewed normal parameters for blood pressure Telephone call to pt, spoke with pt and informed she is to take hydralazine and olmesartan, pt states she is otw out the door to attend outpatient PT and will stop by pharmacy and pickup olmesartan  Symptom Management: Take medications as prescribed   Attend all scheduled provider appointments Call pharmacy for medication refills 3-7 days in advance of running out of medications Attend church or other social activities Perform all self care activities independently  Perform IADL's (shopping, preparing meals, housekeeping, managing finances) independently Call provider office for new concerns or questions  check blood pressure 3 times per week choose a place to take my blood pressure (home, clinic or office, retail store) write blood pressure results in a log or diary learn about high blood pressure keep a blood pressure log take blood pressure log to all doctor appointments call doctor for signs and symptoms of high blood pressure keep all doctor appointments take medications for blood pressure exactly as prescribed report new symptoms to your doctor eat more whole grains, fruits and vegetables, lean meats and healthy fats Follow low sodium diet- read food labels for sodium content Continue exercising / walking Try to limit sugary soft drinks, choose unsweetened tea, water Continue using your bands at home It is very important that you  check blood pressure daily and keep a log, take  to doctor's appointment  Please take hydralazine and olmesartan/ amlodipine/ HCTZ  Follow Up Plan: Telephone follow up appointment with care management team member scheduled for:   06/16/23 at 215 pm           Our next appointment is by telephone on 06/16/23 at 215 pm  Please call the care guide team at [phone] if you need to cancel or reschedule your appointment.   If you are experiencing a Mental Health or Behavioral Health Crisis or need someone to talk to, please call the Suicide and Crisis Lifeline: 988 call the [place] National Suicide Prevention Lifeline: (319)665-9097 or TTY: (610) 772-5361 TTY 928-225-6507) to talk to a trained counselor call 1-800-273-TALK (toll free, 24 hour hotline) go to [address] [facility] [address], [place] [phone]) call the [employer] Line: [phone] call 911   The patient verbalized understanding of instructions, educational materials, and care plan provided today and DECLINED offer to receive copy of patient instructions, educational materials, and care plan.   Telephone follow up appointment with care management team member scheduled for: 06/16/23 at 215 pm  Irving Shows [facility], BSN / Ambulatory Care Management [phone]

## 2023-05-03 ENCOUNTER — Encounter (HOSPITAL_COMMUNITY): Payer: 59 | Admitting: Physical Therapy

## 2023-05-04 ENCOUNTER — Other Ambulatory Visit: Payer: 59 | Admitting: Pharmacist

## 2023-05-05 ENCOUNTER — Encounter (HOSPITAL_COMMUNITY): Payer: 59 | Admitting: Physical Therapy

## 2023-05-05 ENCOUNTER — Telehealth (HOSPITAL_COMMUNITY): Payer: Self-pay | Admitting: Physical Therapy

## 2023-05-05 NOTE — Telephone Encounter (Signed)
Pt did not show for appt. Unable to reach by phone or leave message.  Lurena Nida, PTA/CLT [facility] Ph: [phone]

## 2023-05-09 ENCOUNTER — Encounter (HOSPITAL_COMMUNITY): Payer: Self-pay | Admitting: Physical Therapy

## 2023-05-09 ENCOUNTER — Ambulatory Visit (HOSPITAL_COMMUNITY): Payer: 59 | Admitting: Physical Therapy

## 2023-05-09 DIAGNOSIS — M6281 Muscle weakness (generalized): Secondary | ICD-10-CM | POA: Diagnosis not present

## 2023-05-09 DIAGNOSIS — M546 Pain in thoracic spine: Secondary | ICD-10-CM

## 2023-05-09 DIAGNOSIS — R2689 Other abnormalities of gait and mobility: Secondary | ICD-10-CM

## 2023-05-09 DIAGNOSIS — G8929 Other chronic pain: Secondary | ICD-10-CM
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
## 2023-05-11 ENCOUNTER — Encounter (HOSPITAL_COMMUNITY): Payer: Self-pay | Admitting: Physical Therapy

## 2023-05-11 ENCOUNTER — Ambulatory Visit (HOSPITAL_COMMUNITY): Payer: 59 | Admitting: Physical Therapy

## 2023-05-11 ENCOUNTER — Other Ambulatory Visit: Payer: 59 | Admitting: Pharmacist

## 2023-05-11 DIAGNOSIS — M6281 Muscle weakness (generalized): Secondary | ICD-10-CM | POA: Diagnosis not present

## 2023-05-11 DIAGNOSIS — M546 Pain in thoracic spine: Secondary | ICD-10-CM

## 2023-05-11 DIAGNOSIS — I10 Essential (primary) hypertension: Secondary | ICD-10-CM

## 2023-05-11 DIAGNOSIS — M25611 Stiffness of right shoulder, not elsewhere classified: Secondary | ICD-10-CM

## 2023-05-11 DIAGNOSIS — R2689 Other abnormalities of gait and mobility: Secondary | ICD-10-CM

## 2023-05-11 DIAGNOSIS — E119 Type 2 diabetes mellitus without complications: Secondary | ICD-10-CM

## 2023-05-11 DIAGNOSIS — G8929 Other chronic pain: Secondary | ICD-10-CM

## 2023-05-11 NOTE — Progress Notes (Signed)
05/11/2023 Name: Gwendolyn Woodward MRN: [id number] DOB: 22-May-1966  Chief Complaint  Patient presents with   Hypertension    Gwendolyn Woodward is a 57 y.o. year old female who presented for a telephone visit.   They were referred to the pharmacist by a quality report for assistance in managing hypertension.    Subjective:  Care Team: Primary Care Provider: Gilmore Laroche, FNP ; Next Scheduled Visit: 05/20/23  Medication Access/Adherence  Current Pharmacy:  [facility] - [place], [place] - [address] [place] [address] Phone: [phone] Fax: [phone]  [id number] - [place], [place] - [address] AT [facility] [address]  [place] [address] Phone: [phone] Fax: [phone]   Patient reports affordability concerns with their medications: No  Patient reports access/transportation concerns to their pharmacy: No  Patient reports adherence concerns with their medications:  No     Hypertension:  Current medications: hydralazine once daily Medications previously tried: lisinopril, hydrochlorothiazide, amlodipine & olmesartan   Patient has a validated, automated, upper arm home BP cuff Current blood pressure readings readings: 160-170-100-120  Patient  hypotensive s/sx including denies dizziness, lightheadedness.  Patient reports hypertensive symptoms including headache, chest pain  Current meal patterns: incorporate low salt  Current physical activity: encouraged as able   Objective:  Lab Results  Component Value Date   HGBA1C 6.5 (H) 04/18/2023    Lab Results  Component Value Date   CREATININE 1.00 04/18/2023   BUN 17 04/18/2023   NA 144 04/18/2023   K 4.5 04/18/2023   CL 105 04/18/2023   CO2 25 04/18/2023    Lab Results  Component Value Date   CHOL 201 (H) 04/18/2023   HDL 68 04/18/2023   LDLCALC 116 (H) 04/18/2023   TRIG 93 04/18/2023   CHOLHDL 3.0 04/18/2023    Medications  Reviewed Today     Reviewed by Danella Maiers, [facility] (Pharmacist) on 05/11/23 at 1513  Med List Status: <None>   Medication Order Taking? Sig Documenting Provider Last Dose Status Informant  ACCU-CHEK GUIDE test strip [id number] No  [provider] Taking Active   Accu-Chek Softclix Lancets lancets [id number] No Once daily testing dx e11.9 Paseda, Baird Kay, FNP Taking Active   atorvastatin (LIPITOR) 80 MG tablet [id number] No Take 1 tablet (80 mg total) by mouth daily. Gilmore Laroche, FNP Taking Active   brompheniramine-pseudoephedrine-DM 30-2-10 MG/5ML syrup [id number] No Take 5 mLs by mouth 4 (four) times daily as needed.  Patient not taking: Reported on 04/27/2023   Gilmore Laroche, FNP Not Taking Active   Cholecalciferol (VITAMIN D3) 25 MCG (1000 UT) CAPS [id number] No Take 1 capsule (1,000 Units total) by mouth daily. Gilmore Laroche, FNP Taking Active   Cyanocobalamin (VITAMIN B12 PO) [id number] No Take by mouth. Once daily [provider] Taking Active Self  escitalopram (LEXAPRO) 10 MG tablet [id number] No Take 1 tablet (10 mg total) by mouth daily. Gilmore Laroche, FNP Taking Active   gabapentin (NEURONTIN) 300 MG capsule [id number] No Take 1 capsule (300 mg total) by mouth daily.  Patient not taking: Reported on 04/27/2023   Gilmore Laroche, FNP Not Taking Active   hydrALAZINE (APRESOLINE) 10 MG tablet [id number] No Take 1 tablet (10 mg total) by mouth daily. Gilmore Laroche, FNP Taking Active   lidocaine (XYLOCAINE) 2 % solution [id number] No Use as directed 15 mLs in the mouth or throat as needed for mouth pain.  Patient not taking: Reported on 04/27/2023  Gilmore Laroche, FNP Not Taking Active   metFORMIN (GLUCOPHAGE-XR) 500 MG 24 hr tablet [id number] No Take 2 tablets (1,000 mg total) by mouth 2 (two) times daily with a meal. Gilmore Laroche, FNP Taking Active   methocarbamol (ROBAXIN) 500 MG tablet [id number] No Take 1 tablet (500 mg total) by mouth at bedtime as needed for  muscle spasms.  Patient not taking: Reported on 04/27/2023   Gilmore Laroche, FNP Not Taking Active   Olmesartan-amLODIPine-HCTZ 40-10-25 MG TABS [id number] No Take 1 tablet by mouth daily.  Patient not taking: Reported on 04/27/2023   Gilmore Laroche, FNP Not Taking Active   OZEMPIC, 0.25 OR 0.5 MG/DOSE, 2 MG/3ML SOPN [id number] No INJECT 0.5 MG INTO THE SKIN ONCE A WEEK Gilmore Laroche, FNP Taking Active   traZODone (DESYREL) 100 MG tablet [id number] No Take 100 mg by mouth at bedtime.  Patient not taking: Reported on 04/27/2023   [provider] Not Taking Active            T2DM --> FBG 101 -On ozempic 0.25mg  weekly--needs refill--Ozempic sent to PCP for cosign -continue metformin -patient denies personal or family history of medullary thyroid carcinoma (MTC) or in patients with Multiple Endocrine Neoplasia syndrome type 2 (MEN 2)   Hypertension -Only on hydral once daily -Has BP cuff at home -167/104 HR89 at home now; reports headaches, some chest pain (encouraged ED if needed) -discussed with PCP -->restart olmesartan/amlodipine/hydrochlorothiazide combo -f/u PCP next friday    Follow Up Plan: 1 month  Kieth Brightly, PharmD, BCACP Clinical Pharmacist, [facility]
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
## 2023-05-12 MED ORDER — OZEMPIC (0.25 OR 0.5 MG/DOSE) 2 MG/3ML ~~LOC~~ SOPN: 0.5000 mg | PEN_INJECTOR | SUBCUTANEOUS | 3 refills | Status: DC

## 2023-05-12 MED ORDER — OLMESARTAN-AMLODIPINE-HCTZ 40-10-25 MG PO TABS: 1.0000 | ORAL_TABLET | Freq: Every day | ORAL | 1 refills | Status: DC

## 2023-05-16 ENCOUNTER — Ambulatory Visit (HOSPITAL_COMMUNITY): Payer: 59 | Admitting: Physical Therapy

## 2023-05-16 DIAGNOSIS — M6281 Muscle weakness (generalized): Secondary | ICD-10-CM

## 2023-05-16 DIAGNOSIS — R2689 Other abnormalities of gait and mobility: Secondary | ICD-10-CM
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
## 2023-05-18 ENCOUNTER — Ambulatory Visit (HOSPITAL_COMMUNITY): Payer: 59 | Admitting: Physical Therapy

## 2023-05-18 ENCOUNTER — Encounter (HOSPITAL_COMMUNITY): Payer: Self-pay | Admitting: Physical Therapy

## 2023-05-18 DIAGNOSIS — R2689 Other abnormalities of gait and mobility: Secondary | ICD-10-CM

## 2023-05-18 DIAGNOSIS — M546 Pain in thoracic spine: Secondary | ICD-10-CM

## 2023-05-18 DIAGNOSIS — M6281 Muscle weakness (generalized): Secondary | ICD-10-CM | POA: Diagnosis not present
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
## 2023-05-20 ENCOUNTER — Ambulatory Visit (INDEPENDENT_AMBULATORY_CARE_PROVIDER_SITE_OTHER): Payer: 59 | Admitting: Family Medicine

## 2023-05-20 ENCOUNTER — Encounter: Payer: Self-pay | Admitting: Family Medicine

## 2023-05-20 VITALS — BP 138/82 | HR 84 | Ht 61.0 in | Wt 263.1 lb

## 2023-05-20 DIAGNOSIS — I10 Essential (primary) hypertension: Secondary | ICD-10-CM | POA: Diagnosis not present

## 2023-05-20 DIAGNOSIS — E1165 Type 2 diabetes mellitus with hyperglycemia: Secondary | ICD-10-CM | POA: Diagnosis not present

## 2023-05-20 DIAGNOSIS — Z7984 Long term (current) use of oral hypoglycemic drugs: Secondary | ICD-10-CM | POA: Diagnosis not present

## 2023-05-20 MED ORDER — HYDRALAZINE HCL 10 MG PO TABS
10.0000 mg | ORAL_TABLET | Freq: Every day | ORAL | 1 refills | Status: DC
Start: 2023-05-20 — End: 2023-07-04

## 2023-05-20 MED ORDER — OLMESARTAN-AMLODIPINE-HCTZ 40-10-25 MG PO TABS
1.0000 | ORAL_TABLET | Freq: Every day | ORAL | 1 refills | Status: DC
Start: 2023-05-20 — End: 2023-06-27

## 2023-05-20 NOTE — Assessment & Plan Note (Addendum)
Controlled Asymptomatic in the clinic Low-sodium diet with increased physical activity encourage Dash eating plan reviewed Hypertension Management: -Continue taking Olmesartan-Amlodipine-Hydrochlorothiazide 40-10-25 mg and Hydralazine 10 mg daily. -Please ensure you are drinking at least 64 ounces of water daily to stay well-hydrated. -You may take Tylenol 650 mg every 6 hours as needed for headaches.  BP Readings from Last 3 Encounters:  05/20/23 138/82  04/18/23 (!) 178/126  02/23/23 138/86

## 2023-05-20 NOTE — Progress Notes (Signed)
Established Patient Office Visit  Subjective:  Patient ID: Gwendolyn Woodward, female    DOB: 07/04/1966  Age: 57 y.o. MRN: [id number]  CC:  Chief Complaint  Patient presents with   Hypertension    1 month f/u, pt reports headache from high bp.     HPI Gwendolyn Woodward is a 57 y.o. female presents for blood pressure f/u. For the details of today's visit, please refer to the assessment and plan.     Past Medical History:  Diagnosis Date   Arthritis    back pain and knee pain, no meds   Asthma    Chest pain    + dyspnea   CVA (cerebral infarction) 1984 , 1988   x2, left sided weakness, history of dizziness   Depression with anxiety    History- no meds   Dysphagia    Fasting hyperglycemia    Gastroesophageal reflux disease    no meds   Headache(784.0)    OTC med PRN   History of anemia    S/p transfusions in 1999 at [facility] after vaginal bleeding; normal CBC and 09/2011   Hyperlipidemia    lipid profile in 11/2011: 203, 106, 58, 124   Hypertension    Lab 09/2011: Normal CMet except glucose of 109-169 and albumin-3.4; normal BP 09/2011-12/2011   Sleep apnea    Does not use CPAP - no longer has CPAP machine   Stroke [facility])     Past Surgical History:  Procedure Laterality Date   CESAREAN SECTION  1984, 1987   x 2   COLONOSCOPY  01/27/2012   Procedure: COLONOSCOPY;  Surgeon: West Bali, MD;  [facility];  Service: Endoscopy;  Laterality: N/A;  8:30   ENDOMETRIAL ABLATION  Feb 2013   ESOPHAGOGASTRODUODENOSCOPY  5/11   [facility]-Dr Eliott Nine GERD distal esophagus, NEGATIVE bx for Barretts   EXTERNAL FIXATION LEG Right 04/19/2020   Procedure: EXTERNAL FIXATION RIGHT ANKLE;  Surgeon: Kathryne Hitch, MD;  Location: [facility] OR;  Service: Orthopedics;  Laterality: Right;   IUD REMOVAL  10/20/2011   Procedure: INTRAUTERINE DEVICE (IUD) REMOVAL;  Surgeon: Leslie Andrea, MD;  Location: [facility] ORS;  Service: Gynecology;  Laterality: N/A;   OPEN REDUCTION  INTERNAL FIXATION (ORIF) TIBIA/FIBULA FRACTURE Right 04/23/2020   Procedure: OPEN REDUCTION INTERNAL FIXATION (ORIF) TIBIA/FIBULA FRACTURE;  Surgeon: Roby Lofts, MD;  Location: [facility] OR;  Service: Orthopedics;  Laterality: Right;   TUBAL LIGATION     WISDOM TOOTH EXTRACTION      Family History  Problem Relation Age of Onset   Cancer Father        Lung/Throat   Heart disease Father    Hypertension Mother    Asthma Mother    Diabetes Maternal Uncle    Anesthesia problems Neg Hx     Social History   Socioeconomic History   Marital status: Divorced    Spouse name: Not on file   Number of children: 2   Years of education: Not on file   Highest education level: Not on file  Occupational History   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  Tobacco Use   Smoking status: Never   Smokeless tobacco: Never  Vaping Use   Vaping status: Never Used  Substance and Sexual Activity   Alcohol use: Not Currently    Comment: Socially, 2 times per yr   Drug use: No   Sexual activity: Yes  Birth control/protection: Surgical    Comment: tubal & ablation  Other Topics Concern   Not on file  Social History Narrative   Lives alone-2 grown children   Social Determinants of Health   Financial Resource Strain: Low Risk  (01/19/2023)   Overall Financial Resource Strain (CARDIA)    Difficulty of Paying Living Expenses: Not hard at all  Food Insecurity: Not on file (05/02/2023)  Transportation Needs: No Transportation Needs (01/19/2023)   PRAPARE - Administrator, Civil Service (Medical): No    Lack of Transportation (Non-Medical): No  Physical Activity: Sufficiently Active (01/19/2023)   Exercise Vital Sign    Days of Exercise per Week: 7 days    Minutes of Exercise per Session: 30 min  Stress: No Stress Concern Present (01/19/2023)   [employer] of Occupational Health - Occupational Stress Questionnaire    Feeling of Stress : Not at all  Social  Connections: Not on File (05/13/2023)   Received from [facility]   Social Connections    Connectedness: 0  Intimate Partner Violence: Not At Risk (01/19/2023)   Humiliation, Afraid, Rape, and Kick questionnaire    Fear of Current or Ex-Partner: No    Emotionally Abused: No    Physically Abused: No    Sexually Abused: No    Outpatient Medications Prior to Visit  Medication Sig Dispense Refill   ACCU-CHEK GUIDE test strip      Accu-Chek Softclix Lancets lancets Once daily testing dx e11.9 50 each 1   atorvastatin (LIPITOR) 80 MG tablet Take 1 tablet (80 mg total) by mouth daily. 90 tablet 3   Cholecalciferol (VITAMIN D3) 25 MCG (1000 UT) CAPS Take 1 capsule (1,000 Units total) by mouth daily. 30 capsule 3   Cyanocobalamin (VITAMIN B12 PO) Take by mouth. Once daily     escitalopram (LEXAPRO) 10 MG tablet Take 1 tablet (10 mg total) by mouth daily. 30 tablet 0   metFORMIN (GLUCOPHAGE-XR) 500 MG 24 hr tablet Take 2 tablets (1,000 mg total) by mouth 2 (two) times daily with a meal. 180 tablet 1   Semaglutide,0.25 or 0.5MG /DOS, (OZEMPIC, 0.25 OR 0.5 MG/DOSE,) 2 MG/3ML SOPN Inject 0.5 mg into the skin once a week. DX:E11.65 3 mL 3   traZODone (DESYREL) 100 MG tablet Take 100 mg by mouth at bedtime.     hydrALAZINE (APRESOLINE) 10 MG tablet Take 1 tablet (10 mg total) by mouth daily. 90 tablet 1   Olmesartan-amLODIPine-HCTZ 40-10-25 MG TABS Take 1 tablet by mouth daily. 90 tablet 1   No facility-administered medications prior to visit.    Allergies  Allergen Reactions   Aspirin     INTERNAL BLEEDING   Tramadol Itching    ROS Review of Systems  Constitutional:  Negative for chills and fever.  Eyes:  Negative for visual disturbance.  Respiratory:  Negative for chest tightness and shortness of breath.   Neurological:  Negative for dizziness and headaches.      Objective:    Physical Exam HENT:     Head: Normocephalic.     Mouth/Throat:     Mouth: Mucous membranes are moist.   Cardiovascular:     Rate and Rhythm: Normal rate.     Heart sounds: Normal heart sounds.  Pulmonary:     Effort: Pulmonary effort is normal.     Breath sounds: Normal breath sounds.  Neurological:     Mental Status: She is alert.     BP 138/82 (BP Location: Left Arm)  Pulse 84   Ht 5\' 1"  (1.549 m)   Wt 263 lb 1.9 oz (119.4 kg)   SpO2 92%   BMI 49.72 kg/m  Wt Readings from Last 3 Encounters:  05/20/23 263 lb 1.9 oz (119.4 kg)  04/18/23 265 lb 1.3 oz (120.2 kg)  02/23/23 267 lb 1.9 oz (121.2 kg)    Lab Results  Component Value Date   TSH 0.793 04/18/2023   Lab Results  Component Value Date   WBC 5.8 04/18/2023   HGB 13.2 04/18/2023   HCT 43.0 04/18/2023   MCV 81 04/18/2023   PLT 204 04/18/2023   Lab Results  Component Value Date   NA 144 04/18/2023   K 4.5 04/18/2023   CO2 25 04/18/2023   GLUCOSE 108 (H) 04/18/2023   BUN 17 04/18/2023   CREATININE 1.00 04/18/2023   BILITOT 0.2 04/18/2023   ALKPHOS 140 (H) 04/18/2023   AST 11 04/18/2023   ALT 13 04/18/2023   PROT 7.1 04/18/2023   ALBUMIN 4.3 04/18/2023   CALCIUM 9.7 04/18/2023   ANIONGAP 9 04/25/2020   EGFR 65 04/18/2023   Lab Results  Component Value Date   CHOL 201 (H) 04/18/2023   Lab Results  Component Value Date   HDL 68 04/18/2023   Lab Results  Component Value Date   LDLCALC 116 (H) 04/18/2023   Lab Results  Component Value Date   TRIG 93 04/18/2023   Lab Results  Component Value Date   CHOLHDL 3.0 04/18/2023   Lab Results  Component Value Date   HGBA1C 6.5 (H) 04/18/2023      Assessment & Plan:  Primary hypertension Assessment & Plan: Controlled Asymptomatic in the clinic Low-sodium diet with increased physical activity encourage Dash eating plan reviewed Hypertension Management: -Continue taking Olmesartan-Amlodipine-Hydrochlorothiazide 40-10-25 mg and Hydralazine 10 mg daily. -Please ensure you are drinking at least 64 ounces of water daily to stay well-hydrated. -You  may take Tylenol 650 mg every 6 hours as needed for headaches.  BP Readings from Last 3 Encounters:  05/20/23 138/82  04/18/23 (!) 178/126  02/23/23 138/86     Orders: -     BMP8+EGFR -     hydrALAZINE HCl; Take 1 tablet (10 mg total) by mouth daily.  Dispense: 90 tablet; Refill: 1 -     Olmesartan-amLODIPine-HCTZ; Take 1 tablet by mouth daily.  Dispense: 90 tablet; Refill: 1  Type 2 diabetes mellitus with hyperglycemia, without long-term current use of insulin (HCC) -     HM Diabetes Foot Exam  Note: This chart has been completed using Engineer, civil (consulting) software, and while attempts have been made to ensure accuracy, certain words and phrases may not be transcribed as intended.    Follow-up: Return in about 4 months (around 09/19/2023).   Gilmore Laroche, FNP

## 2023-05-20 NOTE — Patient Instructions (Addendum)
I appreciate the opportunity to provide care to you today!    Follow up:  4 months  Labs: please stop by the lab today to get your blood drawn (BMP)   Hypertension Management: -Continue taking Olmesartan-Amlodipine-Hydrochlorothiazide 40-10-25 mg and Hydralazine 10 mg daily. -Please ensure you are drinking at least 64 ounces of water daily to stay well-hydrated. -You may take Tylenol 650 mg every 6 hours as needed for headaches.   Attached with your AVS, you will find valuable resources for self-education. I highly recommend dedicating some time to thoroughly examine them.   Please continue to a heart-healthy diet and increase your physical activities. Try to exercise for at least five days a week.    It was a pleasure to see you and I look forward to continuing to work together on your health and well-being. Please do not hesitate to call the office if you need care or have questions about your care.  In case of emergency, please visit the Emergency Department for urgent care, or contact our clinic at [phone] to schedule an appointment. We're here to help you!   Have a wonderful day and week. With Gratitude, Gilmore Laroche MSN, FNP-BC

## 2023-05-21 LAB — BMP8+EGFR
BUN/Creatinine Ratio: 17 (ref 9–23)
BUN: 20 mg/dL (ref 6–24)
CO2: 25 mmol/L (ref 20–29)
Calcium: 10 mg/dL (ref 8.7–10.2)
Chloride: 100 mmol/L (ref 96–106)
Creatinine, Ser: 1.17 mg/dL — ABNORMAL HIGH (ref 0.57–1.00)
Glucose: 96 mg/dL (ref 70–99)
Potassium: 4.4 mmol/L (ref 3.5–5.2)
Sodium: 142 mmol/L (ref 134–144)
eGFR: 54 mL/min/{1.73_m2} — ABNORMAL LOW (ref >59)

## 2023-05-23 NOTE — Progress Notes (Signed)
Please inform the patient that her labs are stable

## 2023-05-24 ENCOUNTER — Encounter (HOSPITAL_COMMUNITY): Payer: 59 | Admitting: Physical Therapy

## 2023-05-26 ENCOUNTER — Encounter (HOSPITAL_COMMUNITY): Payer: Self-pay | Admitting: Physical Therapy

## 2023-05-26 ENCOUNTER — Ambulatory Visit (HOSPITAL_COMMUNITY): Payer: 59 | Attending: Family Medicine | Admitting: Physical Therapy

## 2023-05-26 DIAGNOSIS — R2689 Other abnormalities of gait and mobility: Secondary | ICD-10-CM | POA: Insufficient documentation

## 2023-05-26 DIAGNOSIS — M25611 Stiffness of right shoulder, not elsewhere classified: Secondary | ICD-10-CM | POA: Diagnosis present

## 2023-05-26 DIAGNOSIS — G8929 Other chronic pain: Secondary | ICD-10-CM | POA: Insufficient documentation

## 2023-05-26 DIAGNOSIS — M6281 Muscle weakness (generalized): Secondary | ICD-10-CM | POA: Diagnosis present

## 2023-05-26 DIAGNOSIS — M25511 Pain in right shoulder: Secondary | ICD-10-CM | POA: Diagnosis present

## 2023-05-26 DIAGNOSIS — M546 Pain in thoracic spine: Secondary | ICD-10-CM | POA: Insufficient documentation
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
## 2023-05-31 ENCOUNTER — Encounter (HOSPITAL_COMMUNITY): Payer: 59 | Admitting: Physical Therapy

## 2023-06-02 ENCOUNTER — Encounter (HOSPITAL_COMMUNITY): Payer: 59 | Admitting: Physical Therapy

## 2023-06-07 ENCOUNTER — Encounter (HOSPITAL_COMMUNITY): Payer: 59

## 2023-06-09 ENCOUNTER — Encounter (HOSPITAL_COMMUNITY): Payer: 59

## 2023-06-15 ENCOUNTER — Other Ambulatory Visit: Payer: Self-pay | Admitting: Pharmacist

## 2023-06-15 NOTE — Progress Notes (Signed)
06/15/2023 Name: Gwendolyn Woodward MRN: [id number] DOB: 11/14/1965  Chief Complaint  Patient presents with   Hypertension    Gwendolyn Woodward is a 57 y.o. year old female who presented for a telephone visit.   They were referred to the pharmacist by their PCP for assistance in managing diabetes and hypertension.    Subjective:  Care Team: Primary Care Provider: Gilmore Laroche, FNP ; Next Scheduled Visit: 08/2023   Medication Access/Adherence  Current Pharmacy:  [facility] - [place], [place] - [address] [place] [address] Phone: [phone] Fax: [phone]  [id number] - [place], [place] - [address] AT [facility] [address] [place] [address] Phone: [phone] Fax: [phone]   Patient reports affordability concerns with their medications: No  Patient reports access/transportation concerns to their pharmacy: No  Patient reports adherence concerns with their medications:  No     Hypertension:   Current medications: olmesartan/hydrochlorothiazide/amlodipine combo; hydralazine once daily Medications previously tried: lisinopril  Patient has a validated, automated, upper arm home BP cuff Current blood pressure readings readings: 130-140/80-90s (much improved)   Patient  hypotensive s/sx including denies dizziness, lightheadedness.  Patient reports hypertensive symptoms including headache, chest pain   Current meal patterns: incorporate low salt   Current physical activity: encouraged as able   Objective:  Lab Results  Component Value Date   HGBA1C 6.5 (H) 04/18/2023    Lab Results  Component Value Date   CREATININE 1.17 (H) 05/20/2023   BUN 20 05/20/2023   NA 142 05/20/2023   K 4.4 05/20/2023   CL 100 05/20/2023   CO2 25 05/20/2023    Lab Results  Component Value Date   CHOL 201 (H) 04/18/2023   HDL 68 04/18/2023   LDLCALC 116 (H) 04/18/2023   TRIG 93 04/18/2023   CHOLHDL 3.0  04/18/2023    Medications Reviewed Today     Reviewed by Danella Maiers, [facility] (Pharmacist) on 06/15/23 at 1016  Med List Status: <None>   Medication Order Taking? Sig Documenting Provider Last Dose Status Informant  ACCU-CHEK GUIDE test strip [id number] No  [provider] Taking Active   Accu-Chek Softclix Lancets lancets [id number] No Once daily testing dx e11.9 Paseda, Baird Kay, FNP Taking Active   atorvastatin (LIPITOR) 80 MG tablet [id number] No Take 1 tablet (80 mg total) by mouth daily. Gilmore Laroche, FNP Taking Active   Cholecalciferol (VITAMIN D3) 25 MCG (1000 UT) CAPS [id number] No Take 1 capsule (1,000 Units total) by mouth daily. Gilmore Laroche, FNP Taking Active   Cyanocobalamin (VITAMIN B12 PO) [id number] No Take by mouth. Once daily [provider] Taking Active Self  escitalopram (LEXAPRO) 10 MG tablet [id number] No Take 1 tablet (10 mg total) by mouth daily. Gilmore Laroche, FNP Taking Active   hydrALAZINE (APRESOLINE) 10 MG tablet [id number]  Take 1 tablet (10 mg total) by mouth daily. Gilmore Laroche, FNP  Active   metFORMIN (GLUCOPHAGE-XR) 500 MG 24 hr tablet [id number] No Take 2 tablets (1,000 mg total) by mouth 2 (two) times daily with a meal. Gilmore Laroche, FNP Taking Active   Olmesartan-amLODIPine-HCTZ 40-10-25 MG TABS [id number]  Take 1 tablet by mouth daily. Gilmore Laroche, FNP  Active   Semaglutide,0.25 or 0.5MG /DOS, (OZEMPIC, 0.25 OR 0.5 MG/DOSE,) 2 MG/3ML SOPN [id number] No Inject 0.5 mg into the skin once a week. DX:E11.65 Gilmore Laroche, FNP Taking Active   traZODone (DESYREL) 100 MG tablet [id number] No Take 100 mg by mouth  at bedtime. [provider] Taking Active             T2DM --> FBG <130 -Continue Ozempic 0.25mg  --> 0.5mg  weekly -continue metformin (GFR 54) will watch and consider switching ot SGLT2 at f/u -patient denies personal or family history of medullary thyroid carcinoma (MTC) or in patients with Multiple Endocrine  Neoplasia syndrome type 2 (MEN 2)     Hypertension -BP now at goal! Continue olmesartan/amlodipine/hydrochlorothiazide comb; plus hydralazine for now (may be able to d/c) -Has BP cuff at home -denies headaches, denies chest pain (resolved with improved BP)       Follow Up Plan: 3 months   Kieth Brightly, PharmD, BCACP, CPP Clinical Pharmacist, [facility]

## 2023-06-16 ENCOUNTER — Other Ambulatory Visit: Payer: Self-pay | Admitting: *Deleted

## 2023-06-16 ENCOUNTER — Encounter: Payer: Self-pay | Admitting: *Deleted

## 2023-06-16 NOTE — Patient Outreach (Signed)
Care Management   Visit Note  06/16/2023 Name: Gwendolyn Woodward MRN: [id number] DOB: [birth year]  Subjective: Gwendolyn Woodward is a 57 y.o. year old female who is a primary care patient of Gilmore Laroche, FNP. The Care Management team was consulted for assistance.      Engaged with patient spoke with patient by telephone for follow up   Goals Addressed             This Visit's Progress    COMPLETED: CCM (DIABETES) EXPECTED OUTCOME:  MONITOR, SELF-MANAGE AND REDUCE SYMPTOMS OF DIABETES       Current Barriers:  Knowledge Deficits related to Diabetes management Chronic Disease Management support and education needs related to Diabetes, diet Patient reports checks CBG once daily fasting with readings in 90-low 100's range, AIC on 04/18/23 6.5, pt is pleased with [name] Patient reports she does not always adhere to special diet, sometimes drinks sugary soft drinks and eats crackers, reports she is trying to drink mostly water Patient reports she has had 2 falls this past year Patient reports she exercises at senior center and plans to try water aerobics at [employer] Patient reports she has all medication and taking as prescribed, has been working with pharmacist ongoing   Planned Interventions: Reviewed medications with patient and discussed importance of medication adherence;        Counseled on importance of regular laboratory monitoring as prescribed;        Discussed plans with patient for ongoing care management follow up and provided patient with direct contact information for care management team;      Advised patient, providing education and rationale, to check cbg once daily and record        call provider for findings outside established parameters;       Review of patient status, including review of consultants reports, relevant laboratory and other test results, and medications completed;       Advised patient to discuss any issues with blood sugar, medications with provider;       Reviewed importance of exercise/ walking and water aerobics (pt interested in) for management of Diabetes Reviewed instructions given by primary care provider- drink at least 64 ounce of fluid daily Reinforced importance of taking Vitamin D and all medications as prescribed Reviewed carbohydrate modified diet Reviewed plan of care with pt including case closure for nurse  Symptom Management: Take medications as prescribed   Attend all scheduled provider appointments Call pharmacy for medication refills 3-7 days in advance of running out of medications Attend church or other social activities Perform all self care activities independently  Perform IADL's (shopping, preparing meals, housekeeping, managing finances) independently Call provider office for new concerns or questions  check blood sugar at prescribed times: once daily check feet daily for cuts, sores or redness enter blood sugar readings and medication or insulin into daily log take the blood sugar log to all doctor visits take the blood sugar meter to all doctor visits trim toenails straight across drink 6 to 8 glasses of water each day eat fish at least once per week fill half of plate with vegetables limit fast food meals to no more than 1 per week manage portion size prepare main meal at home 3 to 5 days each week read food labels for fat, fiber, carbohydrates and portion size wash and dry feet carefully every day wear comfortable, cotton socks wear comfortable, well-fitting shoes Take vitamin D as prescribed Drink at least 64 ounces of fluid daily fall  prevention strategies: change position slowly, use assistive device such as walker or cane (per provider recommendations) when walking, keep walkways clear, have good lighting in room. It is important to contact your provider if you have any falls, maintain muscle strength/tone by exercise per provider recommendations. Case closure for nurse- continue working with  pharmacist as needed         COMPLETED: CCM (HYPERTENSION) EXPECTED OUTCOME: MONITOR, SELF-MANAGE AND REDUCE SYMPTOMS OF HYPERTENSION       Current Barriers:  Knowledge Deficits related to Hypertension management Chronic Disease Management support and education needs related to Hypertension, diet No Advanced Directives in place- already has information at home Patient reports lives alone, is independent in all aspects of her care, has adult daughter she can call of if needed Patient reports she is walking most days of the week Patient reports she is checking blood pressure daily and "readings are now overall good"  Blood pressure at last primary care provider appointment on 05/20/23 was 138/82 Patient states she has prefilled medication packs and this works very well for her Patient reports she has been helping baby sit her  Patient reports she attended outpatient PT for right shoulder and states " this was helpful" No new concerns reported  Planned Interventions: Evaluation of current treatment plan related to hypertension self management and patient's adherence to plan as established by provider;   Reviewed medications with patient and discussed importance of compliance;  Counseled on the importance of exercise goals with target of 150 minutes per week Advised patient, providing education and rationale, to monitor blood pressure daily and record, calling PCP for findings outside established parameters;  Advised patient to discuss any issues with blood pressure, medications with provider; Discussed complications of poorly controlled blood pressure such as heart disease, stroke, circulatory complications, vision complications, kidney impairment, sexual dysfunction;  Pain assessment completed Reviewed low sodium diet and importance of reading labels for sodium content Reviewed importance of continuing to exercise and using bands Reviewed importance of checking blood pressure daily and  keeping a log and bringing log to primary care provider appointments Reviewed normal parameters for blood pressure Reviewed plan of care including case closure for nurse, pt verbalizes understanding and pharmacist will outreach pt January 2025   Symptom Management: Take medications as prescribed   Attend all scheduled provider appointments Call pharmacy for medication refills 3-7 days in advance of running out of medications Attend church or other social activities Perform all self care activities independently  Perform IADL's (shopping, preparing meals, housekeeping, managing finances) independently Call provider office for new concerns or questions  check blood pressure 3 times per week choose a place to take my blood pressure (home, clinic or office, retail store) write blood pressure results in a log or diary learn about high blood pressure keep a blood pressure log take blood pressure log to all doctor appointments call doctor for signs and symptoms of high blood pressure keep all doctor appointments take medications for blood pressure exactly as prescribed report new symptoms to your doctor eat more whole grains, fruits and vegetables, lean meats and healthy fats Follow low sodium diet- read food labels for sodium content Continue exercising / walking Try to limit sugary soft drinks, choose unsweetened tea, water Continue using your bands at home It is very important that you check blood pressure daily and keep a log, take to doctor's appointment           Plan: No further follow up required: case closure for  nurse  Irving Shows [facility], BSN [place]/ Ambulatory Care Management [phone]

## 2023-06-16 NOTE — Patient Instructions (Signed)
Visit Information  Thank you for taking time to visit with me today. Please don't hesitate to contact me if I can be of assistance to you before our next scheduled telephone appointment.  Following are the goals we discussed today:   Goals Addressed             This Visit's Progress    COMPLETED: CCM (DIABETES) EXPECTED OUTCOME:  MONITOR, SELF-MANAGE AND REDUCE SYMPTOMS OF DIABETES       Current Barriers:  Knowledge Deficits related to Diabetes management Chronic Disease Management support and education needs related to Diabetes, diet Patient reports checks CBG once daily fasting with readings in 90-low 100's range, AIC on 04/18/23 6.5, pt is pleased with [facility] Patient reports she does not always adhere to special diet, sometimes drinks sugary soft drinks and eats crackers, reports she is trying to drink mostly water Patient reports she has had 2 falls this past year Patient reports she exercises at senior center and plans to try water aerobics at [facility] Patient reports she has all medication and taking as prescribed, has been working with pharmacist ongoing   Planned Interventions: Reviewed medications with patient and discussed importance of medication adherence;        Counseled on importance of regular laboratory monitoring as prescribed;        Discussed plans with patient for ongoing care management follow up and provided patient with direct contact information for care management team;      Advised patient, providing education and rationale, to check cbg once daily and record        call provider for findings outside established parameters;       Review of patient status, including review of consultants reports, relevant laboratory and other test results, and medications completed;       Advised patient to discuss any issues with blood sugar, medications with provider;      Reviewed importance of exercise/ walking and water aerobics (pt interested in) for management of  Diabetes Reviewed instructions given by primary care provider- drink at least 64 ounce of fluid daily Reinforced importance of taking Vitamin D and all medications as prescribed Reviewed carbohydrate modified diet Reviewed plan of care with pt including case closure for nurse  Symptom Management: Take medications as prescribed   Attend all scheduled provider appointments Call pharmacy for medication refills 3-7 days in advance of running out of medications Attend church or other social activities Perform all self care activities independently  Perform IADL's (shopping, preparing meals, housekeeping, managing finances) independently Call provider office for new concerns or questions  check blood sugar at prescribed times: once daily check feet daily for cuts, sores or redness enter blood sugar readings and medication or insulin into daily log take the blood sugar log to all doctor visits take the blood sugar meter to all doctor visits trim toenails straight across drink 6 to 8 glasses of water each day eat fish at least once per week fill half of plate with vegetables limit fast food meals to no more than 1 per week manage portion size prepare main meal at home 3 to 5 days each week read food labels for fat, fiber, carbohydrates and portion size wash and dry feet carefully every day wear comfortable, cotton socks wear comfortable, well-fitting shoes Take vitamin D as prescribed Drink at least 64 ounces of fluid daily fall prevention strategies: change position slowly, use assistive device such as walker or cane (per provider recommendations) when walking, keep walkways clear,  have good lighting in room. It is important to contact your provider if you have any falls, maintain muscle strength/tone by exercise per provider recommendations. Case closure for nurse- continue working with pharmacist as needed         COMPLETED: CCM (HYPERTENSION) EXPECTED OUTCOME: MONITOR, SELF-MANAGE  AND REDUCE SYMPTOMS OF HYPERTENSION       Current Barriers:  Knowledge Deficits related to Hypertension management Chronic Disease Management support and education needs related to Hypertension, diet No Advanced Directives in place- already has information at home Patient reports lives alone, is independent in all aspects of her care, has adult daughter she can call of if needed Patient reports she is walking most days of the week Patient reports she is checking blood pressure daily and "readings are now overall good"  Blood pressure at last primary care provider appointment on 05/20/23 was 138/82 Patient states she has prefilled medication packs and this works very well for her Patient reports she has been helping baby sit her  Patient reports she attended outpatient PT for right shoulder and states " this was helpful" No new concerns reported  Planned Interventions: Evaluation of current treatment plan related to hypertension self management and patient's adherence to plan as established by provider;   Reviewed medications with patient and discussed importance of compliance;  Counseled on the importance of exercise goals with target of 150 minutes per week Advised patient, providing education and rationale, to monitor blood pressure daily and record, calling PCP for findings outside established parameters;  Advised patient to discuss any issues with blood pressure, medications with provider; Discussed complications of poorly controlled blood pressure such as heart disease, stroke, circulatory complications, vision complications, kidney impairment, sexual dysfunction;  Pain assessment completed Reviewed low sodium diet and importance of reading labels for sodium content Reviewed importance of continuing to exercise and using bands Reviewed importance of checking blood pressure daily and keeping a log and bringing log to primary care provider appointments Reviewed normal parameters for blood  pressure Reviewed plan of care including case closure for nurse, pt verbalizes understanding and pharmacist will outreach pt January 2025   Symptom Management: Take medications as prescribed   Attend all scheduled provider appointments Call pharmacy for medication refills 3-7 days in advance of running out of medications Attend church or other social activities Perform all self care activities independently  Perform IADL's (shopping, preparing meals, housekeeping, managing finances) independently Call provider office for new concerns or questions  check blood pressure 3 times per week choose a place to take my blood pressure (home, clinic or office, retail store) write blood pressure results in a log or diary learn about high blood pressure keep a blood pressure log take blood pressure log to all doctor appointments call doctor for signs and symptoms of high blood pressure keep all doctor appointments take medications for blood pressure exactly as prescribed report new symptoms to your doctor eat more whole grains, fruits and vegetables, lean meats and healthy fats Follow low sodium diet- read food labels for sodium content Continue exercising / walking Try to limit sugary soft drinks, choose unsweetened tea, water Continue using your bands at home It is very important that you check blood pressure daily and keep a log, take to doctor's appointment           Please call the care guide team at [phone] if you need to cancel or reschedule your appointment.   If you are experiencing a Mental Health or Behavioral Health Crisis  or need someone to talk to, please call the Suicide and Crisis Lifeline: 988 call the [place] National Suicide Prevention Lifeline: 430-390-1862 or TTY: 931-760-7360 TTY (415)524-6295) to talk to a trained counselor call 1-800-273-TALK (toll free, 24 hour hotline) go to [facility] [address] [place]  [phone]) call the [facility] Line: [phone] call 911   The patient verbalized understanding of instructions, educational materials, and care plan provided today and DECLINED offer to receive copy of patient instructions, educational materials, and care plan.   No further follow up required: case closure for nurse  Irving Shows [facility], BSN [facility]/ Ambulatory Care Management [phone]

## 2023-06-27 ENCOUNTER — Telehealth: Payer: Self-pay | Admitting: Family Medicine

## 2023-06-27 ENCOUNTER — Other Ambulatory Visit: Payer: Self-pay

## 2023-06-27 DIAGNOSIS — E7849 Other hyperlipidemia: Secondary | ICD-10-CM

## 2023-06-27 DIAGNOSIS — I10 Essential (primary) hypertension: Secondary | ICD-10-CM

## 2023-06-27 DIAGNOSIS — E119 Type 2 diabetes mellitus without complications: Secondary | ICD-10-CM

## 2023-06-27 MED ORDER — METFORMIN HCL ER 500 MG PO TB24: 1000.0000 mg | ORAL_TABLET | Freq: Two times a day (BID) | ORAL | 1 refills | Status: DC

## 2023-06-27 MED ORDER — ATORVASTATIN CALCIUM 80 MG PO TABS: 80.0000 mg | ORAL_TABLET | Freq: Every day | ORAL | 3 refills | Status: DC

## 2023-06-27 MED ORDER — OLMESARTAN-AMLODIPINE-HCTZ 40-10-25 MG PO TABS: 1.0000 | ORAL_TABLET | Freq: Every day | ORAL | 1 refills | Status: DC

## 2023-06-27 MED ORDER — OZEMPIC (0.25 OR 0.5 MG/DOSE) 2 MG/3ML ~~LOC~~ SOPN: 0.5000 mg | PEN_INJECTOR | SUBCUTANEOUS | 3 refills | Status: DC

## 2023-06-27 NOTE — Telephone Encounter (Signed)
Prescription Request  06/27/2023  LOV: 05/20/2023  What is the name of the medication or equipment? atorvastatin (LIPITOR) 80 MG tablet [[id number]] traMADol (ULTRAM) 50 MG tablet [[id number]]  DISCONTINUED metFORMIN (GLUCOPHAGE-XR) 500 MG 24 hr tablet [[id number]] PROPANALOL  Olmesartan-amLODIPine-HCTZ 40-10-25 MG TABS [[id number]]  Semaglutide,0.25 or 0.5MG /DOS, (OZEMPIC, 0.25 OR 0.5 MG/DOSE,) 2 MG/3ML SOPN [[id number]]      Have you contacted your pharmacy to request a refill? Yes   Which pharmacy would you like this sent to?   [facility]  Fx [phone] Phone [phone]     Patient notified that their request is being sent to the clinical staff for review and that they should receive a response within 2 business days.   Please advise at [facility] [phone]

## 2023-06-30 ENCOUNTER — Telehealth: Payer: Self-pay

## 2023-06-30 NOTE — Telephone Encounter (Signed)
Messaged routed

## 2023-07-01 ENCOUNTER — Telehealth: Payer: Self-pay | Admitting: Family Medicine

## 2023-07-01 NOTE — Telephone Encounter (Signed)
Copied from CRM [id number]. Topic: Clinical - Medication Refill >> Jul 01, 2023 12:44 PM Theodis Sato wrote: Most Recent Primary Care Visit:  Provider: Gilmore Laroche  Department: [facility]  Visit Type: OFFICE VISIT  Date: 05/20/2023  Medication: Ozempic 0.25 Pen Olmesartan/Amlodipine 40mg  Propranolol 10MG   Metformin er 500 mg Atorvastatin 80mg  Has the patient contacted their pharmacy? Yes (Agent: If no, request that the patient contact the pharmacy for the refill. If patient does not wish to contact the pharmacy document the reason why and proceed with request.) (Agent: If yes, when and what did the pharmacy advise?)  Is this the correct pharmacy for this prescription? May need to contact PT to confirm If no, delete pharmacy and type the correct one.  This is the patient's preferred pharmacy:  [facility] [place], [place] - [address] [place] [address] Phone: [phone] Fax: [phone]  [id number] - [place], [place] - [address] AT [facility] [address] [place] [address] Phone: [phone] Fax: [phone]   Has the prescription been filled recently? Unknown  Is the patient out of the medication? Unknown  Has the patient been seen for an appointment in the last year OR does the patient have an upcoming appointment? Unknown  Can we respond through MyChart? No Agent: Please be advised that Rx refills may take up to 3 business days. We ask that you follow-up with your pharmacy.

## 2023-07-04 ENCOUNTER — Other Ambulatory Visit: Payer: Self-pay

## 2023-07-04 DIAGNOSIS — E119 Type 2 diabetes mellitus without complications: Secondary | ICD-10-CM

## 2023-07-04 DIAGNOSIS — E559 Vitamin D deficiency, unspecified: Secondary | ICD-10-CM

## 2023-07-04 DIAGNOSIS — E7849 Other hyperlipidemia: Secondary | ICD-10-CM

## 2023-07-04 DIAGNOSIS — F339 Major depressive disorder, recurrent, unspecified: Secondary | ICD-10-CM

## 2023-07-04 DIAGNOSIS — I10 Essential (primary) hypertension: Secondary | ICD-10-CM

## 2023-07-04 MED ORDER — METFORMIN HCL ER 500 MG PO TB24: 1000.0000 mg | ORAL_TABLET | Freq: Two times a day (BID) | ORAL | 1 refills | Status: DC

## 2023-07-04 MED ORDER — VITAMIN D3 25 MCG (1000 UT) PO CAPS: 1000.0000 [IU] | ORAL_CAPSULE | Freq: Every day | ORAL | 3 refills | Status: DC

## 2023-07-04 MED ORDER — OZEMPIC (0.25 OR 0.5 MG/DOSE) 2 MG/3ML ~~LOC~~ SOPN: 0.5000 mg | PEN_INJECTOR | SUBCUTANEOUS | 3 refills | Status: DC

## 2023-07-04 MED ORDER — ATORVASTATIN CALCIUM 80 MG PO TABS: 80.0000 mg | ORAL_TABLET | Freq: Every day | ORAL | 3 refills | Status: DC

## 2023-07-04 MED ORDER — HYDRALAZINE HCL 10 MG PO TABS: 10.0000 mg | ORAL_TABLET | Freq: Every day | ORAL | 1 refills | Status: DC

## 2023-07-04 MED ORDER — ESCITALOPRAM OXALATE 10 MG PO TABS: 10.0000 mg | ORAL_TABLET | Freq: Every day | ORAL | 0 refills | Status: DC

## 2023-07-04 MED ORDER — OLMESARTAN-AMLODIPINE-HCTZ 40-10-25 MG PO TABS: 1.0000 | ORAL_TABLET | Freq: Every day | ORAL | 1 refills | Status: DC

## 2023-07-04 NOTE — Telephone Encounter (Signed)
Refills sent

## 2023-07-04 NOTE — Telephone Encounter (Signed)
Lexi from [facility] calling wanting to have the medications sent to them instead of pt current pharmacy Ozempic 0.25 Pen Olmesartan/Amlodipine 40mg  Propranolol 10MG   Metformin er 500 mg Atorvastatin 80mg  Please advise- thank you

## 2023-07-04 NOTE — Telephone Encounter (Signed)
[facility]ill [facility] to [facility].

## 2023-07-05 ENCOUNTER — Other Ambulatory Visit: Payer: Self-pay | Admitting: Family Medicine

## 2023-07-05 NOTE — Telephone Encounter (Signed)
deny

## 2023-07-10 ENCOUNTER — Other Ambulatory Visit: Payer: Self-pay | Admitting: Family Medicine

## 2023-07-10 DIAGNOSIS — I10 Essential (primary) hypertension: Secondary | ICD-10-CM

## 2023-07-10 DIAGNOSIS — E7849 Other hyperlipidemia: Secondary | ICD-10-CM

## 2023-07-10 DIAGNOSIS — E119 Type 2 diabetes mellitus without complications: Secondary | ICD-10-CM

## 2023-07-10 MED ORDER — OLMESARTAN-AMLODIPINE-HCTZ 40-10-25 MG PO TABS: 1.0000 | ORAL_TABLET | Freq: Every day | ORAL | 1 refills | Status: DC

## 2023-07-10 MED ORDER — METFORMIN HCL ER 500 MG PO TB24: 1000.0000 mg | ORAL_TABLET | Freq: Two times a day (BID) | ORAL | 1 refills | Status: DC

## 2023-07-10 MED ORDER — ATORVASTATIN CALCIUM 80 MG PO TABS: 80.0000 mg | ORAL_TABLET | Freq: Every day | ORAL | 3 refills | Status: DC

## 2023-07-25 ENCOUNTER — Ambulatory Visit (INDEPENDENT_AMBULATORY_CARE_PROVIDER_SITE_OTHER): Payer: 59 | Admitting: Family Medicine

## 2023-07-25 ENCOUNTER — Encounter: Payer: Self-pay | Admitting: Family Medicine

## 2023-07-25 ENCOUNTER — Encounter: Payer: Self-pay | Admitting: *Deleted

## 2023-07-25 VITALS — BP 146/86 | HR 90 | Ht 61.0 in | Wt 263.1 lb

## 2023-07-25 DIAGNOSIS — M79605 Pain in left leg: Secondary | ICD-10-CM | POA: Diagnosis not present

## 2023-07-25 DIAGNOSIS — I10 Essential (primary) hypertension: Secondary | ICD-10-CM

## 2023-07-25 DIAGNOSIS — Z7984 Long term (current) use of oral hypoglycemic drugs: Secondary | ICD-10-CM

## 2023-07-25 DIAGNOSIS — E559 Vitamin D deficiency, unspecified: Secondary | ICD-10-CM

## 2023-07-25 DIAGNOSIS — G4709 Other insomnia: Secondary | ICD-10-CM

## 2023-07-25 DIAGNOSIS — E038 Other specified hypothyroidism: Secondary | ICD-10-CM

## 2023-07-25 DIAGNOSIS — E1165 Type 2 diabetes mellitus with hyperglycemia: Secondary | ICD-10-CM | POA: Diagnosis not present

## 2023-07-25 DIAGNOSIS — E7849 Other hyperlipidemia: Secondary | ICD-10-CM

## 2023-07-25 MED ORDER — METFORMIN HCL ER 500 MG PO TB24: 1000.0000 mg | ORAL_TABLET | Freq: Two times a day (BID) | ORAL | 1 refills | Status: DC

## 2023-07-25 MED ORDER — TRAZODONE HCL 100 MG PO TABS
100.0000 mg | ORAL_TABLET | Freq: Every day | ORAL | 1 refills | Status: DC
Start: 2023-07-25 — End: 2024-02-09

## 2023-07-25 MED ORDER — OLMESARTAN-AMLODIPINE-HCTZ 40-10-25 MG PO TABS
1.0000 | ORAL_TABLET | Freq: Every day | ORAL | 1 refills | Status: DC
Start: 2023-07-25 — End: 2024-02-09

## 2023-07-25 MED ORDER — SEMAGLUTIDE (1 MG/DOSE) 4 MG/3ML ~~LOC~~ SOPN: 1.0000 mg | PEN_INJECTOR | SUBCUTANEOUS | 0 refills | Status: DC

## 2023-07-25 NOTE — Telephone Encounter (Signed)
Noted! Thank you

## 2023-07-25 NOTE — Assessment & Plan Note (Addendum)
The patient denies polyuria, polyphagia, and polydipsia. She is doing well on Ozempic 0.5 mg weekly, and we will increase the dosage to 1 mg weekly. I encouraged her to continue taking metformin 1000 mg twice daily and to decrease her intake of high-sugar foods and beverages. Additionally, I recommended increasing physical activity for better glycemic control. Lab Results  Component Value Date   HGBA1C 6.5 (H) 04/18/2023

## 2023-07-25 NOTE — Assessment & Plan Note (Signed)
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 She reports some pain, rating it 4-5 out of 10, but itching remains the predominant symptom. I encouraged her to apply over-the-counter calamine lotion to the affected site to relieve the itching, alternating with heat and cold therapy for pain relief. She can also take Tylenol as needed. I advised her to follow up if her symptoms worsen or do not improve. The patient denies any redness, warmth, swelling, fever, or chills.

## 2023-07-25 NOTE — Progress Notes (Signed)
Established Patient Office Visit  Subjective:  Patient ID: Gwendolyn Woodward, female    DOB: 09-10-1965  Age: 57 y.o. MRN: [id number]  CC:  Chief Complaint  Patient presents with   Leg Pain    Pt reports left leg pain, has spasms at times. Would like letter for her landlord stating her pet is a support animal.     HPI Gwendolyn Woodward is a 57 y.o. female with past medical history of type 2 diabetes, hyperlipidemia, hypertension presents for f/u of  chronic medical conditions. For the details of today's visit, please refer to the assessment and plan.     Past Medical History:  Diagnosis Date   Arthritis    back pain and knee pain, no meds   Asthma    Chest pain    + dyspnea   CVA (cerebral infarction) 1984 , 1988   x2, left sided weakness, history of dizziness   Depression with anxiety    History- no meds   Dysphagia    Fasting hyperglycemia    Gastroesophageal reflux disease    no meds   Headache(784.0)    OTC med PRN   History of anemia    S/p transfusions in 1999 at [facility] after vaginal bleeding; normal CBC and 09/2011   Hyperlipidemia    lipid profile in 11/2011: 203, 106, 58, 124   Hypertension    Lab 09/2011: Normal CMet except glucose of 109-169 and albumin-3.4; normal BP 09/2011-12/2011   Sleep apnea    Does not use CPAP - no longer has CPAP machine   Stroke [facility])     Past Surgical History:  Procedure Laterality Date   CESAREAN SECTION  1984, 1987   x 2   COLONOSCOPY  01/27/2012   Procedure: COLONOSCOPY;  Surgeon: West Bali, MD;  Location: [facility] ENDO SUITE;  Service: Endoscopy;  Laterality: N/A;  8:30   ENDOMETRIAL ABLATION  Feb 2013   ESOPHAGOGASTRODUODENOSCOPY  5/11   [facility]-Dr Eliott Nine GERD distal esophagus, NEGATIVE bx for Barretts   EXTERNAL FIXATION LEG Right 04/19/2020   Procedure: EXTERNAL FIXATION RIGHT ANKLE;  Surgeon: Kathryne Hitch, MD;  Location: [facility] OR;  Service: Orthopedics;  Laterality: Right;   IUD REMOVAL  10/20/2011    Procedure: INTRAUTERINE DEVICE (IUD) REMOVAL;  Surgeon: Leslie Andrea, MD;  Location: [facility] ORS;  Service: Gynecology;  Laterality: N/A;   OPEN REDUCTION INTERNAL FIXATION (ORIF) TIBIA/FIBULA FRACTURE Right 04/23/2020   Procedure: OPEN REDUCTION INTERNAL FIXATION (ORIF) TIBIA/FIBULA FRACTURE;  Surgeon: Roby Lofts, MD;  Location: [facility] OR;  Service: Orthopedics;  Laterality: Right;   TUBAL LIGATION     WISDOM TOOTH EXTRACTION      Family History  Problem Relation Age of Onset   Cancer Father        Lung/Throat   Heart disease Father    Hypertension Mother    Asthma Mother    Diabetes Maternal Uncle    Anesthesia problems Neg Hx     Social History   Socioeconomic History   Marital status: Divorced    Spouse name: Not on file   Number of children: 2   Years of education: Not on file   Highest education level: Not on file  Occupational History   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  Tobacco Use   Smoking status: Never   Smokeless tobacco: Never  Vaping Use   Vaping status: Never Used  Substance and Sexual  Activity   Alcohol use: Not Currently    Comment: Socially, 2 times per yr   Drug use: No   Sexual activity: Yes    Birth control/protection: Surgical    Comment: tubal & ablation  Other Topics Concern   Not on file  Social History Narrative   Lives alone-2 grown children   Social Determinants of Health   Financial Resource Strain: Low Risk  (01/19/2023)   Overall Financial Resource Strain (CARDIA)    Difficulty of Paying Living Expenses: Not hard at all  Food Insecurity: Not on File (05/19/2023)   Received from [facility] Insecurity    Food: 0  Transportation Needs: No Transportation Needs (01/19/2023)   PRAPARE - Administrator, Civil Service (Medical): No    Lack of Transportation (Non-Medical): No  Physical Activity: Sufficiently Active (01/19/2023)   Exercise Vital Sign    Days of Exercise per Week: 7 days     Minutes of Exercise per Session: 30 min  Stress: No Stress Concern Present (01/19/2023)   [employer] of Occupational Health - Occupational Stress Questionnaire    Feeling of Stress : Not at all  Social Connections: Not on File (05/13/2023)   Received from [facility]   Social Connections    Connectedness: 0  Intimate Partner Violence: Not At Risk (01/19/2023)   Humiliation, Afraid, Rape, and Kick questionnaire    Fear of Current or Ex-Partner: No    Emotionally Abused: No    Physically Abused: No    Sexually Abused: No    Outpatient Medications Prior to Visit  Medication Sig Dispense Refill   ACCU-CHEK GUIDE test strip      Accu-Chek Softclix Lancets lancets Once daily testing dx e11.9 50 each 1   atorvastatin (LIPITOR) 80 MG tablet Take 1 tablet (80 mg total) by mouth daily. 90 tablet 3   Cholecalciferol (VITAMIN D3) 25 MCG (1000 UT) CAPS Take 1 capsule (1,000 Units total) by mouth daily. 30 capsule 3   Cyanocobalamin (VITAMIN B12 PO) Take by mouth. Once daily     escitalopram (LEXAPRO) 10 MG tablet Take 1 tablet (10 mg total) by mouth daily. 30 tablet 0   hydrALAZINE (APRESOLINE) 10 MG tablet Take 1 tablet (10 mg total) by mouth daily. 90 tablet 1   Semaglutide,0.25 or 0.5MG /DOS, (OZEMPIC, 0.25 OR 0.5 MG/DOSE,) 2 MG/3ML SOPN Inject 0.5 mg into the skin once a week. DX:E11.65 3 mL 3   metFORMIN (GLUCOPHAGE-XR) 500 MG 24 hr tablet Take 2 tablets (1,000 mg total) by mouth 2 (two) times daily with a meal. 180 tablet 1   Olmesartan-amLODIPine-HCTZ 40-10-25 MG TABS Take 1 tablet by mouth daily. 90 tablet 1   traZODone (DESYREL) 100 MG tablet Take 100 mg by mouth at bedtime.     No facility-administered medications prior to visit.    Allergies  Allergen Reactions   Aspirin     INTERNAL BLEEDING   Tramadol Itching    ROS Review of Systems  Constitutional:  Negative for chills and fever.  Eyes:  Negative for visual disturbance.  Respiratory:  Negative for chest tightness and  shortness of breath.   Musculoskeletal:        Lateral left leg pain/pruritis   Neurological:  Negative for dizziness and headaches.      Objective:    Physical Exam HENT:     Head: Normocephalic.     Mouth/Throat:     Mouth: Mucous membranes are moist.  Cardiovascular:  Rate and Rhythm: Normal rate.     Heart sounds: Normal heart sounds.  Pulmonary:     Effort: Pulmonary effort is normal.     Breath sounds: Normal breath sounds.  Musculoskeletal:     Right lower leg: No deformity, lacerations, tenderness or bony tenderness.     Left lower leg: No deformity, lacerations, tenderness or bony tenderness.  Neurological:     Mental Status: She is alert.     BP (!) 146/86 (BP Location: Left Arm)   Pulse 90   Ht 5\' 1"  (1.549 m)   Wt 263 lb 1.3 oz (119.3 kg)   SpO2 95%   BMI 49.71 kg/m  Wt Readings from Last 3 Encounters:  07/25/23 263 lb 1.3 oz (119.3 kg)  05/20/23 263 lb 1.9 oz (119.4 kg)  04/18/23 265 lb 1.3 oz (120.2 kg)    Lab Results  Component Value Date   TSH 0.793 04/18/2023   Lab Results  Component Value Date   WBC 5.8 04/18/2023   HGB 13.2 04/18/2023   HCT 43.0 04/18/2023   MCV 81 04/18/2023   PLT 204 04/18/2023   Lab Results  Component Value Date   NA 142 05/20/2023   K 4.4 05/20/2023   CO2 25 05/20/2023   GLUCOSE 96 05/20/2023   BUN 20 05/20/2023   CREATININE 1.17 (H) 05/20/2023   BILITOT 0.2 04/18/2023   ALKPHOS 140 (H) 04/18/2023   AST 11 04/18/2023   ALT 13 04/18/2023   PROT 7.1 04/18/2023   ALBUMIN 4.3 04/18/2023   CALCIUM 10.0 05/20/2023   ANIONGAP 9 04/25/2020   EGFR 54 (L) 05/20/2023   Lab Results  Component Value Date   CHOL 201 (H) 04/18/2023   Lab Results  Component Value Date   HDL 68 04/18/2023   Lab Results  Component Value Date   LDLCALC 116 (H) 04/18/2023   Lab Results  Component Value Date   TRIG 93 04/18/2023   Lab Results  Component Value Date   CHOLHDL 3.0 04/18/2023   Lab Results  Component Value  Date   HGBA1C 6.5 (H) 04/18/2023      Assessment & Plan:  Primary hypertension Assessment & Plan: Uncontrolled today in the clinic Patient reports that she has been out of her BP medication for 2 weeks She is asymptomatic We will reinstate therapy on olmesartan-amlodipine-hydrochlorothiazide 40-10-25 Low-sodium diet with increase physical activity encouraged BP Readings from Last 3 Encounters:  07/25/23 (!) 146/86  06/15/23 130/82  05/20/23 138/82      Orders: -     Olmesartan-amLODIPine-HCTZ; Take 1 tablet by mouth daily.  Dispense: 90 tablet; Refill: 1  Other hyperlipidemia Assessment & Plan: Encouraged to continue taking Lipitor 80 mg daily Recommend decreasing intake of greasy, fatty, starchy foods with increase physical activity Lab Results  Component Value Date   CHOL 201 (H) 04/18/2023   HDL 68 04/18/2023   LDLCALC 116 (H) 04/18/2023   TRIG 93 04/18/2023   CHOLHDL 3.0 04/18/2023      Orders: -     Lipid panel -     CMP14+EGFR -     CBC with Differential/Platelet  Other insomnia Assessment & Plan: Refill trazodone Sleep hygiene practices reviewed with the patient  Non-Pharmacological Management for Sleep Hygiene:  Establish a Consistent Bedtime Routine: -Develop and adhere to a regular sleep and wake schedule. -Avoid using electronic devices, including computers and smartphones, at least one hour before bedtime. -If unable to fall asleep within 15 minutes, refrain from staying in  bed and engage in a relaxing activity until you feel sleepy. Reduce Daily Stress: -Engage in stress-reducing activities before bedtime to help relax your mind and body. Avoid intense physical exercise and stimulant use, such as caffeine, late in the day. Optimize Sleep Environment: -Use the bed and bedroom exclusively for sleep and intimate activities. -Consider removing electronic devices from the sleeping area and limit screen time prior to bedtime. Incorporate Relaxation  Techniques: -Practice abdominal breathing and meditation to promote relaxation. Utilize progressive muscle relaxation and visualization techniques to aid in achieving restful sleep.   Orders: -     traZODone HCl; Take 1 tablet (100 mg total) by mouth at bedtime.  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 She reports some pain, rating it 4-5 out of 10, but itching remains the predominant symptom. I encouraged her to apply over-the-counter calamine lotion to the affected site to relieve the itching, alternating with heat and cold therapy for pain relief. She can also take Tylenol as needed. I advised her to follow up if her symptoms worsen or do not improve. The patient denies any redness, warmth, swelling, fever, or chills.    Type 2 diabetes mellitus with hyperglycemia, without long-term current use of insulin (HCC) -     metFORMIN HCl ER; Take 2 tablets (1,000 mg total) by mouth 2 (two) times daily with a meal.  Dispense: 180 tablet; Refill: 1 -     Semaglutide (1 MG/DOSE); Inject 1 mg as directed once a week.  Dispense: 3 mL; Refill: 0 -     Hemoglobin A1c  Vitamin D deficiency -     VITAMIN D 25 Hydroxy (Vit-D Deficiency, Fractures)  TSH (thyroid-stimulating hormone deficiency) -     TSH + free T4  Note: This chart has been completed using Engineer, civil (consulting) software, and while attempts have been made to ensure accuracy, certain words and phrases may not be transcribed as intended.    Follow-up: Return in about 4 months (around 11/23/2023).   Gilmore Laroche, FNP

## 2023-07-25 NOTE — Assessment & Plan Note (Signed)
Uncontrolled today in the clinic Patient reports that she has been out of her BP medication for 2 weeks She is asymptomatic We will reinstate therapy on olmesartan-amlodipine-hydrochlorothiazide 40-10-25 Low-sodium diet with increase physical activity encouraged BP Readings from Last 3 Encounters:  07/25/23 (!) 146/86  06/15/23 130/82  05/20/23 138/82

## 2023-07-25 NOTE — Assessment & Plan Note (Signed)
Refill trazodone Sleep hygiene practices reviewed with the patient  Non-Pharmacological Management for Sleep Hygiene:  Establish a Consistent Bedtime Routine: -Develop and adhere to a regular sleep and wake schedule. -Avoid using electronic devices, including computers and smartphones, at least one hour before bedtime. -If unable to fall asleep within 15 minutes, refrain from staying in bed and engage in a relaxing activity until you feel sleepy. Reduce Daily Stress: -Engage in stress-reducing activities before bedtime to help relax your mind and body. Avoid intense physical exercise and stimulant use, such as caffeine, late in the day. Optimize Sleep Environment: -Use the bed and bedroom exclusively for sleep and intimate activities. -Consider removing electronic devices from the sleeping area and limit screen time prior to bedtime. Incorporate Relaxation Techniques: -Practice abdominal breathing and meditation to promote relaxation. Utilize progressive muscle relaxation and visualization techniques to aid in achieving restful sleep.

## 2023-07-25 NOTE — Patient Instructions (Addendum)
I appreciate the opportunity to provide care to you today!    Follow up:  4 months  Labs: please stop by the lab during the week to get your blood drawn (CBC, CMP, TSH, Lipid profile, HgA1c, Vit D)  Left leg pain Please use OTC calamine lotion to help relieve itching at the affected site. I recommend applying it as needed, along with heat/cold application for  pain relief. You may also take Tylenol as needed for pain control. Please follow up if your symptoms persist or do not improve with the current regimen.  Please continue to a heart-healthy diet and increase your physical activities. Try to exercise for at least five days a week.    It was a pleasure to see you and I look forward to continuing to work together on your health and well-being. Please do not hesitate to call the office if you need care or have questions about your care.  In case of emergency, please visit the Emergency Department for urgent care, or contact our clinic at [phone] to schedule an appointment. We're here to help you!   Have a wonderful day and week. With Gratitude, Gilmore Laroche MSN, FNP-BC

## 2023-07-25 NOTE — Assessment & Plan Note (Signed)
Encouraged to continue taking Lipitor 80 mg daily Recommend decreasing intake of greasy, fatty, starchy foods with increase physical activity Lab Results  Component Value Date   CHOL 201 (H) 04/18/2023   HDL 68 04/18/2023   LDLCALC 116 (H) 04/18/2023   TRIG 93 04/18/2023   CHOLHDL 3.0 04/18/2023

## 2023-07-26 DIAGNOSIS — E559 Vitamin D deficiency, unspecified: Secondary | ICD-10-CM | POA: Diagnosis not present

## 2023-07-26 DIAGNOSIS — E7849 Other hyperlipidemia: Secondary | ICD-10-CM | POA: Diagnosis not present

## 2023-07-26 DIAGNOSIS — E038 Other specified hypothyroidism: Secondary | ICD-10-CM | POA: Diagnosis not present

## 2023-07-26 DIAGNOSIS — E1165 Type 2 diabetes mellitus with hyperglycemia: Secondary | ICD-10-CM | POA: Diagnosis not present

## 2023-07-27 LAB — CBC WITH DIFFERENTIAL/PLATELET
Basophils Absolute: 0 10*3/uL (ref 0.0–0.2)
Basos: 1 %
EOS (ABSOLUTE): 0.1 10*3/uL (ref 0.0–0.4)
Eos: 2 %
Hematocrit: 38.9 % (ref 34.0–46.6)
Hemoglobin: 12 g/dL (ref 11.1–15.9)
Immature Grans (Abs): 0 10*3/uL (ref 0.0–0.1)
Immature Granulocytes: 0 %
Lymphocytes Absolute: 2.8 10*3/uL (ref 0.7–3.1)
Lymphs: 45 %
MCH: 24.2 pg — ABNORMAL LOW (ref 26.6–33.0)
MCHC: 30.8 g/dL — ABNORMAL LOW (ref 31.5–35.7)
MCV: 78 fL — ABNORMAL LOW (ref 79–97)
Monocytes Absolute: 0.4 10*3/uL (ref 0.1–0.9)
Monocytes: 6 %
Neutrophils Absolute: 2.9 10*3/uL (ref 1.4–7.0)
Neutrophils: 46 %
Platelets: 207 10*3/uL (ref 150–450)
RBC: 4.96 x10E6/uL (ref 3.77–5.28)
RDW: 14.7 % (ref 11.7–15.4)
WBC: 6.2 10*3/uL (ref 3.4–10.8)

## 2023-07-27 LAB — TSH+FREE T4
Free T4: 1.09 ng/dL (ref 0.82–1.77)
TSH: 1.35 u[IU]/mL (ref 0.450–4.500)

## 2023-07-27 LAB — CMP14+EGFR
ALT: 17 [IU]/L (ref 0–32)
AST: 11 [IU]/L (ref 0–40)
Albumin: 4 g/dL (ref 3.8–4.9)
Alkaline Phosphatase: 143 [IU]/L — ABNORMAL HIGH (ref 44–121)
BUN/Creatinine Ratio: 17 (ref 9–23)
BUN: 17 mg/dL (ref 6–24)
Bilirubin Total: 0.2 mg/dL (ref 0.0–1.2)
CO2: 23 mmol/L (ref 20–29)
Calcium: 9.3 mg/dL (ref 8.7–10.2)
Chloride: 103 mmol/L (ref 96–106)
Creatinine, Ser: 1.01 mg/dL — ABNORMAL HIGH (ref 0.57–1.00)
Globulin, Total: 2.9 g/dL (ref 1.5–4.5)
Glucose: 97 mg/dL (ref 70–99)
Potassium: 3.9 mmol/L (ref 3.5–5.2)
Sodium: 144 mmol/L (ref 134–144)
Total Protein: 6.9 g/dL (ref 6.0–8.5)
eGFR: 64 mL/min/{1.73_m2} (ref >59)

## 2023-07-27 LAB — LIPID PANEL
Chol/HDL Ratio: 3.3 {ratio} (ref 0.0–4.4)
Cholesterol, Total: 190 mg/dL (ref 100–199)
HDL: 57 mg/dL (ref >39)
LDL Chol Calc (NIH): 113 mg/dL — ABNORMAL HIGH (ref 0–99)
Triglycerides: 114 mg/dL (ref 0–149)
VLDL Cholesterol Cal: 20 mg/dL (ref 5–40)

## 2023-07-27 LAB — HEMOGLOBIN A1C
Est. average glucose Bld gHb Est-mCnc: 137 mg/dL
Hgb A1c MFr Bld: 6.4 % — ABNORMAL HIGH (ref 4.8–5.6)

## 2023-07-27 LAB — VITAMIN D 25 HYDROXY (VIT D DEFICIENCY, FRACTURES): Vit D, 25-Hydroxy: 19.5 ng/mL — ABNORMAL LOW (ref 30.0–100.0)

## 2023-07-28 DIAGNOSIS — E118 Type 2 diabetes mellitus with unspecified complications: Secondary | ICD-10-CM | POA: Diagnosis not present

## 2023-07-29 ENCOUNTER — Other Ambulatory Visit: Payer: Self-pay | Admitting: Family Medicine

## 2023-07-29 DIAGNOSIS — E559 Vitamin D deficiency, unspecified: Secondary | ICD-10-CM

## 2023-07-29 DIAGNOSIS — E7849 Other hyperlipidemia: Secondary | ICD-10-CM

## 2023-07-29 MED ORDER — VITAMIN D (ERGOCALCIFEROL) 1.25 MG (50000 UNIT) PO CAPS: 50000.0000 [IU] | ORAL_CAPSULE | ORAL | 1 refills | Status: DC

## 2023-07-29 MED ORDER — EZETIMIBE 10 MG PO TABS: 10.0000 mg | ORAL_TABLET | Freq: Every day | ORAL | 3 refills | Status: DC

## 2023-07-29 NOTE — Progress Notes (Signed)
Please inform the patient that a prescription for a weekly vitamin D supplement has been sent to her pharmacy because her vitamin D levels are low. Her cholesterol levels are elevated and not at the goal of 55. I recommend continuing her current treatment regimen of rosuvastatin 80 mg daily, and a prescription for ezetimibe 10 mg daily has been added to her treatment plan. I also recommend decreasing her intake of greasy, fatty, and starchy foods, along with increasing physical activity.

## 2023-08-10 DIAGNOSIS — E118 Type 2 diabetes mellitus with unspecified complications: Secondary | ICD-10-CM | POA: Diagnosis not present

## 2023-08-31 ENCOUNTER — Other Ambulatory Visit: Payer: Self-pay | Admitting: Pharmacist

## 2023-09-09 DIAGNOSIS — E118 Type 2 diabetes mellitus with unspecified complications: Secondary | ICD-10-CM | POA: Diagnosis not present

## 2023-09-19 ENCOUNTER — Ambulatory Visit: Payer: 59 | Admitting: Family Medicine

## 2023-09-21 DIAGNOSIS — E118 Type 2 diabetes mellitus with unspecified complications: Secondary | ICD-10-CM | POA: Diagnosis not present

## 2023-09-26 ENCOUNTER — Encounter: Payer: Self-pay | Admitting: Family Medicine

## 2023-11-10 ENCOUNTER — Ambulatory Visit: Admitting: Family Medicine

## 2023-11-10 ENCOUNTER — Other Ambulatory Visit: Payer: Self-pay | Admitting: Family Medicine

## 2023-11-10 ENCOUNTER — Ambulatory Visit (HOSPITAL_COMMUNITY)
Admission: RE | Admit: 2023-11-10 | Discharge: 2023-11-10 | Disposition: A | Discharge status: Home / Self Care | Source: Ambulatory Visit | Attending: Family Medicine | Admitting: Family Medicine

## 2023-11-10 ENCOUNTER — Encounter: Payer: Self-pay | Admitting: Family Medicine

## 2023-11-10 VITALS — BP 161/95 | HR 84 | Resp 16 | Ht 60.0 in | Wt 259.1 lb

## 2023-11-10 DIAGNOSIS — M542 Cervicalgia: Secondary | ICD-10-CM | POA: Diagnosis not present

## 2023-11-10 DIAGNOSIS — M545 Low back pain, unspecified: Secondary | ICD-10-CM

## 2023-11-10 DIAGNOSIS — R600 Localized edema: Secondary | ICD-10-CM | POA: Diagnosis not present

## 2023-11-10 DIAGNOSIS — M4802 Spinal stenosis, cervical region: Secondary | ICD-10-CM | POA: Diagnosis not present

## 2023-11-10 DIAGNOSIS — M47816 Spondylosis without myelopathy or radiculopathy, lumbar region: Secondary | ICD-10-CM | POA: Diagnosis not present

## 2023-11-10 DIAGNOSIS — M47812 Spondylosis without myelopathy or radiculopathy, cervical region: Secondary | ICD-10-CM | POA: Diagnosis not present

## 2023-11-10 MED ORDER — FUROSEMIDE 20 MG PO TABS: 20.0000 mg | ORAL_TABLET | ORAL | 2 refills | Status: DC

## 2023-11-10 MED ORDER — METHYLPREDNISOLONE ACETATE 80 MG/ML IJ SUSP: 80.0000 mg | Freq: Once | INTRAMUSCULAR | Status: DC

## 2023-11-10 MED ORDER — CYCLOBENZAPRINE HCL 5 MG PO TABS: 5.0000 mg | ORAL_TABLET | Freq: Three times a day (TID) | ORAL | 1 refills | Status: DC | PRN

## 2023-11-10 MED ORDER — METHYLPREDNISOLONE ACETATE 40 MG/ML IJ SUSP: 40.0000 mg | Freq: Once | INTRAMUSCULAR | Status: DC

## 2023-11-10 MED ORDER — CYCLOBENZAPRINE HCL 5 MG PO TABS: 5.0000 mg | ORAL_TABLET | Freq: Two times a day (BID) | ORAL | 1 refills | Status: DC | PRN

## 2023-11-10 NOTE — Patient Instructions (Signed)

## 2023-11-10 NOTE — Progress Notes (Signed)
 Established Patient Office Visit   Subjective  Patient ID: Gwendolyn Woodward, female    DOB: 12-Nov-1965  Age: 58 y.o. MRN: [id number]  Chief Complaint  Patient presents with   Pain    Hurting all over. Right arm hurts to lift and right leg hurts and swells     She  has a past medical history of Arthritis, Asthma, Chest pain, CVA (cerebral infarction) (1984 , 1988), Depression with anxiety, Dysphagia, Fasting hyperglycemia, Gastroesophageal reflux disease, Headache(784.0), History of anemia, Hyperlipidemia, Hypertension, Sleep apnea, and Stroke (HCC).  Back Pain This is a chronic problem. The problem occurs constantly. The problem has been gradually worsening since onset. The pain is present in the lumbar spine. The quality of the pain is described as aching. The pain radiates to the left thigh and right thigh. The pain is at a severity of 10/10. The pain is The same all the time. The symptoms are aggravated by sitting, lying down and position. Associated symptoms include headaches, leg pain, numbness and weakness. Pertinent negatives include no bladder incontinence, bowel incontinence or pelvic pain. Risk factors include lack of exercise, poor posture, obesity and sedentary lifestyle. She has tried NSAIDs and analgesics for the symptoms. The treatment provided mild relief.  Neck Pain  This is a chronic problem. The problem occurs constantly. The problem has been gradually worsening. The pain is associated with nothing. The pain is present in the occipital region. The quality of the pain is described as aching. The pain is at a severity of 8/10. The symptoms are aggravated by position and bending. The pain is Same all the time. Stiffness is present At night. Associated symptoms include headaches, leg pain, numbness and weakness. Pertinent negatives include no trouble swallowing. She has tried NSAIDs and bed rest for the symptoms. The treatment provided no relief.    Review of Systems  HENT:   Negative for trouble swallowing.   Gastrointestinal:  Negative for bowel incontinence.  Genitourinary:  Negative for bladder incontinence and pelvic pain.  Musculoskeletal:  Positive for back pain and neck pain.  Neurological:  Positive for weakness, numbness and headaches.      Objective:     BP (!) 161/95   Pulse 84   Resp 16   Ht 5' (1.524 m)   Wt 259 lb 1.9 oz (117.5 kg)   SpO2 92%   BMI 50.61 kg/m  BP Readings from Last 3 Encounters:  11/10/23 (!) 161/95  07/25/23 (!) 146/86  06/15/23 130/82      Physical Exam Vitals reviewed.  Constitutional:      General: She is not in acute distress.    Appearance: Normal appearance. She is not ill-appearing, toxic-appearing or diaphoretic.  HENT:     Head: Normocephalic.  Eyes:     General:        Right eye: No discharge.        Left eye: No discharge.     Conjunctiva/sclera: Conjunctivae normal.  Cardiovascular:     Rate and Rhythm: Normal rate.     Pulses: Normal pulses.     Heart sounds: Normal heart sounds.  Pulmonary:     Effort: Pulmonary effort is normal. No respiratory distress.     Breath sounds: Normal breath sounds.  Abdominal:     General: Bowel sounds are normal.     Palpations: Abdomen is soft.     Tenderness: There is no abdominal tenderness. There is no right CVA tenderness, left CVA tenderness or guarding.  Musculoskeletal:  Cervical back: Pain with movement present. Decreased range of motion.     Thoracic back: Decreased range of motion.     Lumbar back: Decreased range of motion. Positive right straight leg raise test and positive left straight leg raise test.     Right lower leg: Edema present.     Left lower leg: Edema present.  Skin:    General: Skin is warm and dry.     Capillary Refill: Capillary refill takes less than 2 seconds.  Neurological:     Mental Status: She is alert.     Coordination: Coordination normal.     Gait: Gait abnormal.  Psychiatric:        Mood and Affect: Mood  normal.        Behavior: Behavior normal.      No results found for any visits on 11/10/23.  The ASCVD Risk score (Arnett DK, et al., 2019) failed to calculate for the following reasons:   Risk score cannot be calculated because patient has a medical history suggesting prior/existing ASCVD    Assessment & Plan:  Leg edema -     Furosemide; Take 1 tablet (20 mg total) by mouth every other day.  Dispense: 30 tablet; Refill: 2  Neck pain Assessment & Plan: Xray ordered Depo-Medrol 80 mg IM injection given today I explained to the patient that non-pharmacological interventions include the application of ice or heat, rest, and recommended range of motion exercises along with gentle stretching. For pain management, Tylenol was advised. The patient was instructed to follow up if symptoms worsen or persist. The patient verbalized understanding of the care plan, and all questions were answered.   Orders: -     DG Lumbar Spine 2-3 Views; Future -     Cyclobenzaprine HCl; Take 1 tablet (5 mg total) by mouth 2 (two) times daily as needed.  Dispense: 60 tablet; Refill: 1  Lumbar pain Assessment & Plan: Xray ordered Flexeril 5 mg PRN Discussed to focus on maintaining good posture, using lumbar support while sitting, and avoiding prolonged sitting or heavy lifting. Engage in low-impact exercises like walking or swimming to strengthen core muscles and reduce strain on the spine. Apply heat or ice packs as needed for pain relief and consider physical therapy for targeted exercises.   Orders: -     DG Cervical Spine Complete; Future -     methylPREDNISolone Acetate    Return if symptoms worsen or fail to improve.   Cruzita Lederer Newman Nip, FNP

## 2023-11-10 NOTE — Assessment & Plan Note (Addendum)
 Xray ordered Depo-Medrol 80 mg IM injection given today I explained to the patient that non-pharmacological interventions include the application of ice or heat, rest, and recommended range of motion exercises along with gentle stretching. For pain management, Tylenol was advised. The patient was instructed to follow up if symptoms worsen or persist. The patient verbalized understanding of the care plan, and all questions were answered.

## 2023-11-10 NOTE — Assessment & Plan Note (Signed)
 Xray ordered Flexeril 5 mg PRN Discussed to focus on maintaining good posture, using lumbar support while sitting, and avoiding prolonged sitting or heavy lifting. Engage in low-impact exercises like walking or swimming to strengthen core muscles and reduce strain on the spine. Apply heat or ice packs as needed for pain relief and consider physical therapy for targeted exercises.

## 2023-11-10 NOTE — Addendum Note (Signed)
 Addended by: Abner Greenspan on: 11/10/2023 04:58 PM   Modules accepted: Orders

## 2023-11-24 ENCOUNTER — Other Ambulatory Visit: Payer: Self-pay | Admitting: Family Medicine

## 2023-11-24 DIAGNOSIS — M542 Cervicalgia: Secondary | ICD-10-CM

## 2023-11-24 NOTE — Progress Notes (Signed)
 Please inform patient,  Your cervical spine shows no fractures and alignment is normal.  Findings: Degenerative joint changes (arthritis-related wear and tear).  Bone spurs (C3, C5, C6) and disc space narrowing (C3-4, C5-6) suggest mild degeneration.  Foraminal narrowing (C5-6 bilaterally, C3-4 on the right) may cause nerve compression, leading to pain or tingling.  Next Steps & Treatment: treatment includes physical therapy, anti-inflammatory medications: Ibuprofen (Advil, Motrin),  Naproxen (Aleve, Naprosyn) or Diclofenac (Voltaren)    Referral placed to neurosurgery

## 2023-11-24 NOTE — Progress Notes (Signed)
 Please inform patient,  Your imaging shows no fractures in the lumbar spine, and the alignment is normal.  Findings: Mild degenerative changes (early arthritis or wear and tear in the lower back).  Minimal bone spurs (small bony growths in the mid to lower back).  Mild facet joint sclerosis (mild joint stiffness due to degeneration).  Disc spaces are well maintained, meaning no significant disc damage or narrowing.  Next Steps & Treatment: If you have pain, NSAIDs, physical therapy,can help.  If symptoms worsen, further evaluation or imaging may be needed.

## 2023-11-25 ENCOUNTER — Ambulatory Visit: Payer: 59 | Admitting: Family Medicine

## 2023-11-30 NOTE — Progress Notes (Signed)
Second attempt; no answer.

## 2023-12-01 ENCOUNTER — Telehealth: Payer: Self-pay

## 2023-12-01 ENCOUNTER — Encounter: Payer: Self-pay | Admitting: Family Medicine

## 2023-12-01 NOTE — Telephone Encounter (Signed)
 Patient has missed 3 appointments with PCP. Per [facility] policy, patient can be considered for dismissal. Alternatively, FO can attempt to contact patient for rescheduling. Please advise.

## 2023-12-13 ENCOUNTER — Other Ambulatory Visit: Payer: Self-pay | Admitting: Family Medicine

## 2023-12-13 DIAGNOSIS — M542 Cervicalgia: Secondary | ICD-10-CM

## 2023-12-16 ENCOUNTER — Ambulatory Visit

## 2023-12-16 VITALS — BP 154/93 | HR 94 | Ht 61.0 in | Wt 249.0 lb

## 2023-12-16 DIAGNOSIS — F339 Major depressive disorder, recurrent, unspecified: Secondary | ICD-10-CM

## 2023-12-16 DIAGNOSIS — M25511 Pain in right shoulder: Secondary | ICD-10-CM | POA: Diagnosis not present

## 2023-12-16 DIAGNOSIS — I10 Essential (primary) hypertension: Secondary | ICD-10-CM | POA: Diagnosis not present

## 2023-12-16 DIAGNOSIS — G8929 Other chronic pain: Secondary | ICD-10-CM

## 2023-12-16 MED ORDER — TRAMADOL HCL 50 MG PO TABS
50.0000 mg | ORAL_TABLET | Freq: Four times a day (QID) | ORAL | 0 refills | Status: AC | PRN
Start: 2023-12-16 — End: 2023-12-21

## 2023-12-16 MED ORDER — HYDRALAZINE HCL 10 MG PO TABS: 10.0000 mg | ORAL_TABLET | Freq: Every day | ORAL | 1 refills | Status: DC

## 2023-12-16 MED ORDER — ESCITALOPRAM OXALATE 10 MG PO TABS: 10.0000 mg | ORAL_TABLET | Freq: Every day | ORAL | 1 refills | Status: DC

## 2023-12-16 NOTE — Progress Notes (Signed)
 Acute Office Visit  Subjective:     Patient ID: Gwendolyn Woodward, female    DOB: 03-20-1966, 58 y.o.   MRN: [id number]  Chief Complaint  Patient presents with   Shoulder Pain    Right shoulder pain that has became worse over time, difficult to lift above the head    HPI Patient is in today for Shoulder Pain: Patient complaints of right shoulder pain. This is evaluated as a personal injury. The pain is described as aching and throbbing.  The onset of the pain was gradual, starting about several years ago.  The pain occurs continuously and lasts 24 hours.  Location is anterior. No history of dislocation. Symptoms are aggravated by all activities. Symptoms are diminished by  medication: Narcotics, tramadol  used and beneficial.   Limited activities include: reaching, lifting, carrying, work at or above shoulder height. moderate weakness, crepitus right AC region noted is reported. Patient is a disabled and she N/A.     ROS      Objective:    BP (!) 154/93   Pulse 94   Ht 5\' 1"  (1.549 m)   Wt 249 lb (112.9 kg)   SpO2 93%   BMI 47.05 kg/m  BP Readings from Last 3 Encounters:  12/16/23 (!) 154/93  11/10/23 (!) 161/95  07/25/23 (!) 146/86   Wt Readings from Last 3 Encounters:  12/16/23 249 lb (112.9 kg)  11/10/23 259 lb 1.9 oz (117.5 kg)  07/25/23 263 lb 1.3 oz (119.3 kg)      Physical Exam Vitals reviewed.  Constitutional:      General: She is not in acute distress.    Appearance: Normal appearance. She is not ill-appearing, toxic-appearing or diaphoretic.  HENT:     Head: Normocephalic.  Eyes:     General:        Right eye: No discharge.        Left eye: No discharge.     Conjunctiva/sclera: Conjunctivae normal.  Cardiovascular:     Rate and Rhythm: Normal rate.     Pulses: Normal pulses.     Heart sounds: Normal heart sounds.  Pulmonary:     Effort: Pulmonary effort is normal. No respiratory distress.     Breath sounds: Normal breath sounds.  Abdominal:      General: Bowel sounds are normal.     Palpations: Abdomen is soft.     Tenderness: There is no abdominal tenderness. There is no right CVA tenderness, left CVA tenderness or guarding.  Musculoskeletal:     Right shoulder: Tenderness present. Decreased range of motion. Decreased strength.     Left shoulder: No tenderness. Decreased range of motion. Decreased strength.     Cervical back: Pain with movement present. Decreased range of motion.     Thoracic back: Normal.     Lumbar back: Decreased range of motion.     Right lower leg: Edema present.     Left lower leg: Edema present.  Skin:    General: Skin is warm and dry.     Capillary Refill: Capillary refill takes less than 2 seconds.  Neurological:     Mental Status: She is alert.     Coordination: Coordination normal.     Gait: Gait abnormal.  Psychiatric:        Mood and Affect: Mood normal.        Behavior: Behavior normal.     No results found for any visits on 12/16/23.      Assessment &  Plan:   Problem List Items Addressed This Visit       Cardiovascular and Mediastinum   Hypertension   Elevated today, but she reports not taking medications this morning, and she has been out of the hydralazine .  This was renewed at this time.  Discussed need for consistent medication adherence as well as a low sodium diet and daily physical activity.  Recommend f/u in 3 months or sooner if BP remains above 140/90.       Relevant Medications   hydrALAZINE  (APRESOLINE ) 10 MG tablet     Other   Chronic right shoulder pain - Primary   Chronic in nature.  Agree to refill tramadol  for prn pain relief.  She has an upcoming appointment with ortho for a steroid injection in her shoulder.        Relevant Medications   traMADol  (ULTRAM ) 50 MG tablet   escitalopram  (LEXAPRO ) 10 MG tablet   Depression, recurrent (HCC)   Stable with Lexapro .  She denies any SI at this time.  Will refill Lexapro  at current dose.  Recommend f/u in 3 months or  sooner if needed.        Relevant Medications   escitalopram  (LEXAPRO ) 10 MG tablet    Meds ordered this encounter  Medications   traMADol  (ULTRAM ) 50 MG tablet    Sig: Take 1-2 tablets (50-100 mg total) by mouth every 6 (six) hours as needed for up to 5 days.    Dispense:  30 tablet    Refill:  0   escitalopram  (LEXAPRO ) 10 MG tablet    Sig: Take 1 tablet (10 mg total) by mouth daily.    Dispense:  90 tablet    Refill:  1   hydrALAZINE  (APRESOLINE ) 10 MG tablet    Sig: Take 1 tablet (10 mg total) by mouth daily.    Dispense:  90 tablet    Refill:  1    No follow-ups on file.  Alison Irvine, FNP

## 2023-12-18 NOTE — Assessment & Plan Note (Signed)
 Elevated today, but she reports not taking medications this morning, and she has been out of the hydralazine .  This was renewed at this time.  Discussed need for consistent medication adherence as well as a low sodium diet and daily physical activity.  Recommend f/u in 3 months or sooner if BP remains above 140/90.

## 2023-12-18 NOTE — Assessment & Plan Note (Signed)
 Chronic in nature.  Agree to refill tramadol  for prn pain relief.  She has an upcoming appointment with ortho for a steroid injection in her shoulder.

## 2023-12-18 NOTE — Assessment & Plan Note (Signed)
 Stable with Lexapro .  She denies any SI at this time.  Will refill Lexapro  at current dose.  Recommend f/u in 3 months or sooner if needed.

## 2023-12-20 NOTE — Telephone Encounter (Signed)
 Kindly dismiss

## 2023-12-21 ENCOUNTER — Encounter: Payer: Self-pay | Admitting: Family Medicine

## 2024-01-09 ENCOUNTER — Ambulatory Visit (INDEPENDENT_AMBULATORY_CARE_PROVIDER_SITE_OTHER): Admitting: Physician Assistant

## 2024-01-09 ENCOUNTER — Encounter: Payer: Self-pay | Admitting: Physician Assistant

## 2024-01-09 VITALS — BP 182/106 | HR 82 | Temp 98.1°F | Ht 61.0 in | Wt 260.6 lb

## 2024-01-09 DIAGNOSIS — Z7985 Long-term (current) use of injectable non-insulin antidiabetic drugs: Secondary | ICD-10-CM

## 2024-01-09 DIAGNOSIS — E7849 Other hyperlipidemia: Secondary | ICD-10-CM | POA: Diagnosis not present

## 2024-01-09 DIAGNOSIS — E782 Mixed hyperlipidemia: Secondary | ICD-10-CM

## 2024-01-09 DIAGNOSIS — E1165 Type 2 diabetes mellitus with hyperglycemia: Secondary | ICD-10-CM | POA: Diagnosis not present

## 2024-01-09 DIAGNOSIS — I10 Essential (primary) hypertension: Secondary | ICD-10-CM

## 2024-01-09 DIAGNOSIS — Z7689 Persons encountering health services in other specified circumstances: Secondary | ICD-10-CM

## 2024-01-09 MED ORDER — ATORVASTATIN CALCIUM 80 MG PO TABS: 80.0000 mg | ORAL_TABLET | Freq: Every day | ORAL | 3 refills | Status: DC

## 2024-01-09 MED ORDER — EZETIMIBE 10 MG PO TABS: 10.0000 mg | ORAL_TABLET | Freq: Every day | ORAL | 3 refills | Status: DC

## 2024-01-09 NOTE — Assessment & Plan Note (Signed)
 Stable. Continue with current management without changes. Medications refilled today. Discussed healthy diet and lifestyle. Lipid panel today.

## 2024-01-09 NOTE — Assessment & Plan Note (Signed)
 Stable, at home sugars reported 100-160.  Continue current management.  Lipid panel and urine ACR ordered today.  A1c today.  Continue dietary efforts and physical activity.

## 2024-01-09 NOTE — Progress Notes (Signed)
 New Patient Office Visit  Subjective    Patient ID: DI JASMER, female    DOB: [birth year]  Age: 58 y.o. MRN: [id number]  CC: No chief complaint on file.   HPI Gwendolyn Woodward presents to establish care  Patient presents today with past medical history significant for hypertension, hyperlipidemia, type 2 diabetes, chronic low back pain, chronic shoulder pain, depression, and history of CVA. She reports daily compliance with medications. Patient previously seen by [facility]. She presents today with minimal concerns or complaints, she states "I just want to be checked out".   Outpatient Encounter Medications as of 01/09/2024  Medication Sig   ACCU-CHEK GUIDE test strip    Accu-Chek Softclix Lancets lancets Once daily testing dx e11.9   atorvastatin  (LIPITOR) 80 MG tablet Take 1 tablet (80 mg total) by mouth daily.   Cholecalciferol  (VITAMIN D3) 25 MCG (1000 UT) CAPS Take 1 capsule (1,000 Units total) by mouth daily.   Cyanocobalamin (VITAMIN B12 PO) Take by mouth. Once daily   cyclobenzaprine  (FLEXERIL ) 5 MG tablet Take 1 tablet (5 mg total) by mouth 2 (two) times daily as needed.   escitalopram  (LEXAPRO ) 10 MG tablet Take 1 tablet (10 mg total) by mouth daily.   ezetimibe  (ZETIA ) 10 MG tablet Take 1 tablet (10 mg total) by mouth daily.   furosemide  (LASIX ) 20 MG tablet Take 1 tablet (20 mg total) by mouth every other day.   hydrALAZINE  (APRESOLINE ) 10 MG tablet Take 1 tablet (10 mg total) by mouth daily.   metFORMIN  (GLUCOPHAGE -XR) 500 MG 24 hr tablet Take 2 tablets (1,000 mg total) by mouth 2 (two) times daily with a meal.   Olmesartan -amLODIPine -HCTZ 40-10-25 MG TABS Take 1 tablet by mouth daily.   Semaglutide , 1 MG/DOSE, 4 MG/3ML SOPN Inject 1 mg as directed once a week.   Semaglutide ,0.25 or 0.5MG /DOS, (OZEMPIC , 0.25 OR 0.5 MG/DOSE,) 2 MG/3ML SOPN Inject 0.5 mg into the skin once a week. DX:E11.65   traZODone  (DESYREL ) 100 MG tablet Take 1 tablet (100 mg total)  by mouth at bedtime.   Vitamin D , Ergocalciferol , (DRISDOL ) 1.25 MG (50000 UNIT) CAPS capsule Take 1 capsule (50,000 Units total) by mouth every 7 (seven) days.   [DISCONTINUED] atorvastatin  (LIPITOR) 80 MG tablet Take 1 tablet (80 mg total) by mouth daily. (Patient not taking: Reported on 01/09/2024)   [DISCONTINUED] ezetimibe  (ZETIA ) 10 MG tablet Take 1 tablet (10 mg total) by mouth daily. (Patient not taking: Reported on 01/09/2024)   [DISCONTINUED] methylPREDNISolone  acetate (DEPO-MEDROL ) injection 80 mg    No facility-administered encounter medications on file as of 01/09/2024.    Past Medical History:  Diagnosis Date   Arthritis    back pain and knee pain, no meds   Asthma    Chest pain    + dyspnea   CVA (cerebral infarction) 1984 , 1988   x2, left sided weakness, history of dizziness   Depression with anxiety    History- no meds   Dysphagia    Fasting hyperglycemia    Gastroesophageal reflux disease    no meds   Headache(784.0)    OTC med PRN   History of anemia    S/p transfusions in 1999 at [facility] after vaginal bleeding; normal CBC and 09/2011   Hyperlipidemia    lipid profile in 11/2011: 203, 106, 58, 124   Hypertension    Lab 09/2011: Normal CMet except glucose of 109-169 and albumin-3.4; normal BP 09/2011-12/2011   Sleep apnea    Does  not use CPAP - no longer has CPAP machine   Stroke [facility])     Past Surgical History:  Procedure Laterality Date   CESAREAN SECTION  1984, 1987   x 2   COLONOSCOPY  01/27/2012   Procedure: COLONOSCOPY;  Surgeon: Alyce Jubilee, MD;  Location: [facility] SUITE;  Service: Endoscopy;  Laterality: N/A;  8:30   ENDOMETRIAL ABLATION  Feb 2013   ESOPHAGOGASTRODUODENOSCOPY  5/11   [facility] GERD distal esophagus, NEGATIVE bx for Barretts   EXTERNAL FIXATION LEG Right 04/19/2020   Procedure: EXTERNAL FIXATION RIGHT ANKLE;  Surgeon: Arnie Lao, MD;  Location: [facility] OR;  Service: Orthopedics;  Laterality: Right;   IUD  REMOVAL  10/20/2011   Procedure: INTRAUTERINE DEVICE (IUD) REMOVAL;  Surgeon: Jeanmarie Millet, MD;  Location: [facility] ORS;  Service: Gynecology;  Laterality: N/A;   OPEN REDUCTION INTERNAL FIXATION (ORIF) TIBIA/FIBULA FRACTURE Right 04/23/2020   Procedure: OPEN REDUCTION INTERNAL FIXATION (ORIF) TIBIA/FIBULA FRACTURE;  Surgeon: Laneta Pintos, MD;  Location: [facility] OR;  Service: Orthopedics;  Laterality: Right;   TUBAL LIGATION     WISDOM TOOTH EXTRACTION      Family History  Problem Relation Age of Onset   Cancer Father        Lung/Throat   Heart disease Father    Hypertension Mother    Asthma Mother    Diabetes Maternal Uncle    Anesthesia problems Neg Hx     Social History   Socioeconomic History   Marital status: Divorced    Spouse name: Not on file   Number of children: 2   Years of education: Not on file   Highest education level: Not on file  Occupational History   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  Tobacco Use   Smoking status: Never   Smokeless tobacco: Never  Vaping Use   Vaping status: Never Used  Substance and Sexual Activity   Alcohol use: Not Currently    Comment: Socially, 2 times per yr   Drug use: No   Sexual activity: Yes    Birth control/protection: Surgical    Comment: tubal & ablation  Other Topics Concern   Not on file  Social History Narrative   Lives alone-2 grown children   Social Drivers of Health   Financial Resource Strain: Low Risk  (01/19/2023)   Overall Financial Resource Strain (CARDIA)    Difficulty of Paying Living Expenses: Not hard at all  Food Insecurity: Not on File (05/19/2023)   Received from [facility] Insecurity    Food: 0  Transportation Needs: No Transportation Needs (01/19/2023)   PRAPARE - Administrator, Civil Service (Medical): No    Lack of Transportation (Non-Medical): No  Physical Activity: Sufficiently Active (01/19/2023)   Exercise Vital Sign    Days of Exercise per  Week: 7 days    Minutes of Exercise per Session: 30 min  Stress: No Stress Concern Present (01/19/2023)   [employer] of Occupational Health - Occupational Stress Questionnaire    Feeling of Stress : Not at all  Social Connections: Not on File (05/13/2023)   Received from [facility]   Social Connections    Connectedness: 0  Intimate Partner Violence: Not At Risk (01/19/2023)   Humiliation, Afraid, Rape, and Kick questionnaire    Fear of Current or Ex-Partner: No    Emotionally Abused: No    Physically Abused: No  Sexually Abused: No    Review of Systems  Constitutional:  Negative for chills, fever and malaise/fatigue.  Eyes:  Positive for blurred vision. Negative for double vision.  Respiratory:  Negative for cough and shortness of breath.   Cardiovascular:  Negative for chest pain and palpitations.  Musculoskeletal:  Negative for joint pain and myalgias.  Neurological:  Negative for dizziness and headaches.  Psychiatric/Behavioral:  Positive for depression. The patient is not nervous/anxious.         Objective    BP (!) 182/106   Pulse 82   Temp 98.1 F (36.7 C)   Ht 5\' 1"  (1.549 m)   Wt 260 lb 9.6 oz (118.2 kg)   SpO2 96%   BMI 49.24 kg/m   Physical Exam Constitutional:      Appearance: Normal appearance.  HENT:     Head: Normocephalic.     Mouth/Throat:     Mouth: Mucous membranes are moist.     Pharynx: Oropharynx is clear.  Eyes:     Extraocular Movements: Extraocular movements intact.     Conjunctiva/sclera: Conjunctivae normal.  Cardiovascular:     Rate and Rhythm: Normal rate and regular rhythm.     Heart sounds: Normal heart sounds. No murmur heard.    No gallop.  Pulmonary:     Effort: Pulmonary effort is normal.     Breath sounds: Normal breath sounds. No wheezing, rhonchi or rales.  Musculoskeletal:     Right lower leg: Edema present.     Left lower leg: Edema present.  Skin:    General: Skin is warm and dry.  Neurological:     General: No  focal deficit present.     Mental Status: She is alert and oriented to person, place, and time.  Psychiatric:        Mood and Affect: Mood normal.        Behavior: Behavior normal.       Assessment & Plan:  Encounter to establish care  Primary hypertension Assessment & Plan: 175/114 Uncontrolled. Patient has not taken medications today.  Continue current medications. Increase hydralazine  to 20mg  BID.  Lab work today.  Referral to hypertension clinic for further management as this has been a long term concern.  Discussed DASH diet and dietary sodium restrictions.  Continue dietary efforts and physical activity.   Orders: -     CMP14+EGFR -     CBC with Differential/Platelet -     Microalbumin / creatinine urine ratio -     [facility] to [facility] ([id number])  Type 2 diabetes mellitus with hyperglycemia, without long-term current use of insulin  (HCC) Assessment & Plan: Stable, at home sugars reported 100-160.  Continue current management.  Lipid panel and urine ACR ordered today.  A1c today.  Continue dietary efforts and physical activity.    Orders: -     CMP14+EGFR -     Hemoglobin A1c -     Microalbumin / creatinine urine ratio  Mixed hyperlipidemia Assessment & Plan: Stable. Continue with current management without changes. Medications refilled today. Discussed healthy diet and lifestyle. Lipid panel today.    Other hyperlipidemia Assessment & Plan: Stable. Continue with current management without changes. Medications refilled today. Discussed healthy diet and lifestyle. Lipid panel today.   Orders: -     Lipid panel -     Ezetimibe ; Take 1 tablet (10 mg total) by mouth daily.  Dispense: 90 tablet; Refill: 3 -     Atorvastatin  Calcium ; Take 1  tablet (80 mg total) by mouth daily.  Dispense: 90 tablet; Refill: 3  Long-term (current) use of injectable non-insulin  antidiabetic drugs    Return in about 4 weeks (around 02/06/2024) for blood  pressure .   Jearlean Mince Yanelli Zapanta, PA-C

## 2024-01-09 NOTE — Assessment & Plan Note (Addendum)
 175/114 Uncontrolled. Patient has not taken medications today.  Continue current medications. Increase hydralazine  to 20mg  BID.  Lab work today.  Referral to hypertension clinic for further management as this has been a long term concern.  Discussed DASH diet and dietary sodium restrictions.  Continue dietary efforts and physical activity.

## 2024-01-10 LAB — CBC WITH DIFFERENTIAL/PLATELET
Basophils Absolute: 0 10*3/uL (ref 0.0–0.2)
Basos: 0 %
EOS (ABSOLUTE): 0.2 10*3/uL (ref 0.0–0.4)
Eos: 2 %
Hematocrit: 41.1 % (ref 34.0–46.6)
Hemoglobin: 12.8 g/dL (ref 11.1–15.9)
Immature Grans (Abs): 0 10*3/uL (ref 0.0–0.1)
Immature Granulocytes: 0 %
Lymphocytes Absolute: 2.6 10*3/uL (ref 0.7–3.1)
Lymphs: 41 %
MCH: 25.3 pg — ABNORMAL LOW (ref 26.6–33.0)
MCHC: 31.1 g/dL — ABNORMAL LOW (ref 31.5–35.7)
MCV: 81 fL (ref 79–97)
Monocytes Absolute: 0.4 10*3/uL (ref 0.1–0.9)
Monocytes: 7 %
Neutrophils Absolute: 3.2 10*3/uL (ref 1.4–7.0)
Neutrophils: 50 %
Platelets: 200 10*3/uL (ref 150–450)
RBC: 5.06 x10E6/uL (ref 3.77–5.28)
RDW: 15.5 % — ABNORMAL HIGH (ref 11.7–15.4)
WBC: 6.5 10*3/uL (ref 3.4–10.8)

## 2024-01-10 LAB — CMP14+EGFR
ALT: 19 IU/L (ref 0–32)
AST: 14 IU/L (ref 0–40)
Albumin: 4.1 g/dL (ref 3.8–4.9)
Alkaline Phosphatase: 155 IU/L — ABNORMAL HIGH (ref 44–121)
BUN/Creatinine Ratio: 18 (ref 12–28)
BUN: 17 mg/dL (ref 8–27)
Bilirubin Total: <0.2 mg/dL (ref 0.0–1.2)
CO2: 23 mmol/L (ref 20–29)
Calcium: 9.7 mg/dL (ref 8.7–10.3)
Chloride: 105 mmol/L (ref 96–106)
Creatinine, Ser: 0.93 mg/dL (ref 0.57–1.00)
Globulin, Total: 3 g/dL (ref 1.5–4.5)
Glucose: 90 mg/dL (ref 70–99)
Potassium: 4.6 mmol/L (ref 3.5–5.2)
Sodium: 145 mmol/L — ABNORMAL HIGH (ref 134–144)
Total Protein: 7.1 g/dL (ref 6.0–8.5)
eGFR: 70 mL/min/{1.73_m2} (ref >59)

## 2024-01-10 LAB — MICROALBUMIN / CREATININE URINE RATIO
Creatinine, Urine: 178 mg/dL
Microalb/Creat Ratio: 3 mg/g{creat} (ref 0–29)
Microalbumin, Urine: 5.2 ug/mL

## 2024-01-10 LAB — LIPID PANEL
Chol/HDL Ratio: 3 ratio (ref 0.0–4.4)
Cholesterol, Total: 202 mg/dL — ABNORMAL HIGH (ref 100–199)
HDL: 67 mg/dL (ref >39)
LDL Chol Calc (NIH): 106 mg/dL — ABNORMAL HIGH (ref 0–99)
Triglycerides: 172 mg/dL — ABNORMAL HIGH (ref 0–149)
VLDL Cholesterol Cal: 29 mg/dL (ref 5–40)

## 2024-01-10 LAB — HEMOGLOBIN A1C
Est. average glucose Bld gHb Est-mCnc: 137 mg/dL
Hgb A1c MFr Bld: 6.4 % — ABNORMAL HIGH (ref 4.8–5.6)

## 2024-01-11 ENCOUNTER — Ambulatory Visit: Payer: Self-pay | Admitting: Physician Assistant

## 2024-01-23 ENCOUNTER — Ambulatory Visit: Admitting: Adult Health

## 2024-01-23 ENCOUNTER — Encounter: Payer: Self-pay | Admitting: Adult Health

## 2024-01-23 ENCOUNTER — Other Ambulatory Visit (HOSPITAL_COMMUNITY)
Admission: RE | Admit: 2024-01-23 | Discharge: 2024-01-23 | Disposition: A | Discharge status: Home / Self Care | Source: Ambulatory Visit | Attending: Adult Health | Admitting: Adult Health

## 2024-01-23 VITALS — BP 184/119 | HR 76 | Ht 61.0 in | Wt 262.0 lb

## 2024-01-23 DIAGNOSIS — I10 Essential (primary) hypertension: Secondary | ICD-10-CM

## 2024-01-23 DIAGNOSIS — N898 Other specified noninflammatory disorders of vagina: Secondary | ICD-10-CM | POA: Insufficient documentation

## 2024-01-23 DIAGNOSIS — N3946 Mixed incontinence: Secondary | ICD-10-CM | POA: Insufficient documentation

## 2024-01-23 NOTE — Progress Notes (Signed)
  Subjective:     Patient ID: Gwendolyn Woodward, female   DOB: [birth year], 58 y.o.   MRN: [id number]  HPI Gwendolyn Woodward is a 58 year old black female,divorced, PM in complaining of vaginal discharge with with odor. No itching or burning. Feels wet.  She does water  and vinegar douche occasionally.       Component Value Date/Time   DIAGPAP  12/15/2022 1143    - Negative for intraepithelial lesion or malignancy (NILM)   DIAGPAP  05/04/2019 0000    NEGATIVE FOR INTRAEPITHELIAL LESIONS OR MALIGNANCY.   DIAGPAP TRICHOMONAS VAGINALIS PRESENT. 05/04/2019 0000   HPVHIGH Negative 12/15/2022 1143   ADEQPAP  12/15/2022 1143    Satisfactory for evaluation; transformation zone component ABSENT.   ADEQPAP  05/04/2019 0000    Satisfactory for evaluation  endocervical/transformation zone component PRESENT.   PCP is C Grooms PA  Review of Systems +Vaginal discharge with with odor.  No itching or burning.  Feels wet at times, Has some UI with coughing or if has to go No sex in about a year Denies any vaginal bleeding  Reviewed past medical,surgical, social and family history. Reviewed medications and allergies.     Objective:   Physical Exam BP (!) 184/119 (BP Location: Left Wrist, Patient Position: Sitting, Cuff Size: Normal)   Pulse 76   Ht 5\' 1"  (1.549 m)   Wt 262 lb (118.8 kg)   BMI 49.50 kg/m     Skin warm and dry.Pelvic: external genitalia is normal in appearance no lesions, vagina: white watery discharge without odor,urethra has no lesions or masses noted, cervix:smooth, uterus: normal size, shape and contour, non tender, no masses felt, adnexa: no masses or tenderness noted. Bladder is non tender and no masses felt.  CV obtained.  [facility] - 01/23/24 1003       Pregnancy Intention Screening   Does the patient want to become pregnant in the next year? No    Does the patient's partner want to become pregnant in the next year? No    Would the patient like to discuss contraceptive options  today? No      Contraception Wrap Up   Current Method Female Sterilization    End Method Female Sterilization    Contraception Counseling Provided No            Examination chaperoned by Alphonso Aschoff LPN  Assessment:     1. Vaginal discharge (Primary) +white watery discharge CV swab sent for GC/CHL,trich,BV and yeast  - Cervicovaginal ancillary only( [place])  2. Vaginal odor No odor today - Cervicovaginal ancillary only( [place])  3. Mixed stress and urge urinary incontinence Has some UI at times  Try to void frequently and avoid caffeine      4. Hypertension Had not had meds today, go home and take Take Meds and follow up with PCP  Plan:     Follow up prn

## 2024-01-24 ENCOUNTER — Ambulatory Visit: Payer: Self-pay | Admitting: Adult Health

## 2024-01-24 LAB — CERVICOVAGINAL ANCILLARY ONLY
Bacterial Vaginitis (gardnerella): POSITIVE — AB
Candida Glabrata: NEGATIVE
Candida Vaginitis: NEGATIVE
Chlamydia: NEGATIVE
Comment: NEGATIVE
Comment: NEGATIVE
Comment: NEGATIVE
Comment: NEGATIVE
Comment: NEGATIVE
Comment: NORMAL
Neisseria Gonorrhea: NEGATIVE
Trichomonas: NEGATIVE

## 2024-01-24 MED ORDER — METRONIDAZOLE 0.75 % VA GEL: 1.0000 | Freq: Every day | VAGINAL | 0 refills | Status: DC

## 2024-01-24 NOTE — Telephone Encounter (Signed)
 Pt aware swab was + for BV. Med was sent to pharmacy; use for 5 nights. No sex or alcohol while using med. Pt voiced understanding. JSY

## 2024-01-24 NOTE — Telephone Encounter (Signed)
-----   Message from [place] sent at 01/24/2024  3:45 PM EDT ----- Let her know has BV and rx sent in for metrogel  to use for 5 nights, no sex or alcohol. THX

## 2024-02-09 ENCOUNTER — Telehealth: Payer: Self-pay

## 2024-02-09 ENCOUNTER — Ambulatory Visit: Admitting: Physician Assistant

## 2024-02-09 ENCOUNTER — Encounter: Payer: Self-pay | Admitting: Physician Assistant

## 2024-02-09 VITALS — BP 178/102 | HR 84 | Temp 97.7°F | Ht 61.0 in | Wt 261.0 lb

## 2024-02-09 DIAGNOSIS — I10 Essential (primary) hypertension: Secondary | ICD-10-CM

## 2024-02-09 DIAGNOSIS — E7849 Other hyperlipidemia: Secondary | ICD-10-CM

## 2024-02-09 DIAGNOSIS — M5441 Lumbago with sciatica, right side: Secondary | ICD-10-CM | POA: Diagnosis not present

## 2024-02-09 DIAGNOSIS — G4709 Other insomnia: Secondary | ICD-10-CM | POA: Diagnosis not present

## 2024-02-09 DIAGNOSIS — G8929 Other chronic pain: Secondary | ICD-10-CM | POA: Diagnosis not present

## 2024-02-09 MED ORDER — EZETIMIBE 10 MG PO TABS: 10.0000 mg | ORAL_TABLET | Freq: Every day | ORAL | 3 refills | Status: DC

## 2024-02-09 MED ORDER — TRAZODONE HCL 100 MG PO TABS: 100.0000 mg | ORAL_TABLET | Freq: Every day | ORAL | 1 refills | Status: DC

## 2024-02-09 MED ORDER — ATORVASTATIN CALCIUM 80 MG PO TABS: 80.0000 mg | ORAL_TABLET | Freq: Every day | ORAL | 3 refills | Status: DC

## 2024-02-09 MED ORDER — OLMESARTAN-AMLODIPINE-HCTZ 40-10-25 MG PO TABS: 1.0000 | ORAL_TABLET | Freq: Every day | ORAL | 1 refills | Status: DC

## 2024-02-09 NOTE — Progress Notes (Signed)
 Established Patient Office Visit  Subjective   Patient ID: Gwendolyn Woodward, female    DOB: May 28, 1966  Age: 58 y.o. MRN: [id number]  Chief Complaint  Patient presents with   Hypertension    Patient presents today for follow up regarding hypertension. However, she relates generalized confusion relating medications, what to take and when. She has brought medication bottles and pill organizer with her today. She would like clarification on medication regimen today. She denies chest pain, shortness of breath, visual changes, or headache.      Review of Systems  Constitutional:  Negative for fever, malaise/fatigue and weight loss.  Eyes:  Negative for blurred vision and double vision.  Respiratory:  Negative for shortness of breath.   Cardiovascular:  Negative for chest pain and palpitations.  Gastrointestinal:  Negative for nausea and vomiting.  Neurological:  Negative for headaches.      Objective:     BP (!) 178/102   Pulse 84   Temp 97.7 F (36.5 C)   Ht 5' 1 (1.549 m)   Wt 261 lb (118.4 kg)   SpO2 96%   BMI 49.32 kg/m    Physical Exam Constitutional:      Appearance: Normal appearance.  HENT:     Head: Normocephalic.     Mouth/Throat:     Mouth: Mucous membranes are moist.     Pharynx: Oropharynx is clear.   Eyes:     Extraocular Movements: Extraocular movements intact.     Conjunctiva/sclera: Conjunctivae normal.    Cardiovascular:     Rate and Rhythm: Normal rate and regular rhythm.     Heart sounds: Normal heart sounds. No murmur heard. Pulmonary:     Effort: Pulmonary effort is normal.     Breath sounds: Normal breath sounds. No wheezing or rales.   Musculoskeletal:     Right lower leg: Edema present.     Left lower leg: Edema present.   Skin:    General: Skin is warm and dry.   Neurological:     General: No focal deficit present.     Mental Status: She is alert and oriented to person, place, and time.   Psychiatric:        Mood and  Affect: Mood normal.        Behavior: Behavior normal.    No results found for any visits on 02/09/24.  The ASCVD Risk score (Arnett DK, et al., 2019) failed to calculate for the following reasons:   Risk score cannot be calculated because patient has a medical history suggesting prior/existing ASCVD    Assessment & Plan:   Return in about 2 weeks (around 02/23/2024) for blood pressure .   Primary hypertension Assessment & Plan: Blood pressure quite elevated today, however, with significant confusion on medication regimen, I am suspicious patient has not been taking medication as prescribed. Significant time spent today with patient in medication reconciliation and organizing medications. We will follow up in 2 weeks to re-evaluate blood pressure as confusions have been clarified today.   Orders: -     AMB Referral [facility] Care Management -     Olmesartan -amLODIPine -HCTZ; Take 1 tablet by mouth daily.  Dispense: 90 tablet; Refill: 1  Other hyperlipidemia Assessment & Plan: Stable, no voiced concerns. She has not been taking atorvastatin  or Zetia  as prescribed. Medications refilled today.   Orders: -     Atorvastatin  Calcium ; Take 1 tablet (80 mg total) by mouth daily.  Dispense: 90 tablet; Refill: 3 -  Ezetimibe ; Take 1 tablet (10 mg total) by mouth daily.  Dispense: 90 tablet; Refill: 3  Other insomnia -     traZODone  HCl; Take 1 tablet (100 mg total) by mouth at bedtime.  Dispense: 60 tablet; Refill: 1  Chronic right-sided low back pain with right-sided sciatica Assessment & Plan: Patient with increased pain. Unable to control pain with NSAIDs and tylenol . She has tramadol  which she take infrequently. Advised use of tramadol  as needed for pain. Will place referral to pain management.   Orders: -     Ambulatory referral to Pain Clinic    [place] Lainey Nelson, PA-C

## 2024-02-09 NOTE — Addendum Note (Signed)
 Addended by: Marco Severs on: 02/09/2024 09:49 AM   Modules accepted: Orders

## 2024-02-09 NOTE — Assessment & Plan Note (Signed)
 Blood pressure quite elevated today, however, with significant confusion on medication regimen, I am suspicious patient has not been taking medication as prescribed. Significant time spent today with patient in medication reconciliation and organizing medications. We will follow up in 2 weeks to re-evaluate blood pressure as confusions have been clarified today.

## 2024-02-09 NOTE — Progress Notes (Signed)
 Complex Care Management Note Care Guide Note  02/09/2024 Name: Gwendolyn Woodward MRN: [id number] DOB: 09/20/1965   Complex Care Management Outreach Attempts: An unsuccessful telephone outreach was attempted today to offer the patient information about available complex care management services.  Follow Up Plan:  Additional outreach attempts will be made to offer the patient complex care management information and services.   Encounter Outcome:  No Answer  Lenton Rail , RMA     [place]  [facility], [facility] Guide  Direct Dial: [phone]  Website: [email or web address]

## 2024-02-09 NOTE — Assessment & Plan Note (Signed)
 Stable, no voiced concerns. She has not been taking atorvastatin  or Zetia  as prescribed. Medications refilled today.

## 2024-02-09 NOTE — Assessment & Plan Note (Signed)
 Patient with increased pain. Unable to control pain with NSAIDs and tylenol . She has tramadol  which she take infrequently. Advised use of tramadol  as needed for pain. Will place referral to pain management.

## 2024-02-13 NOTE — Progress Notes (Signed)
 Complex Care Management Note Care Guide Note  02/13/2024 Name: Gwendolyn Woodward MRN: [id number] DOB: Apr 17, 1966   Complex Care Management Outreach Attempts: A second unsuccessful outreach was attempted today to offer the patient with information about available complex care management services.  Follow Up Plan:  Additional outreach attempts will be made to offer the patient complex care management information and services.   Encounter Outcome:  No Answer  Jeoffrey Buffalo , RMA     [place]  [facility], [facility] Guide  Direct Dial: [phone]  Website: [email or web address]

## 2024-02-17 NOTE — Progress Notes (Signed)
 Complex Care Management Note Care Guide Note  02/17/2024 Name: Gwendolyn Woodward MRN: [id number] DOB: May 12, 1966   Complex Care Management Outreach Attempts: A third unsuccessful outreach was attempted today to offer the patient with information about available complex care management services.  Follow Up Plan:  No further outreach attempts will be made at this time. We have been unable to contact the patient to offer or enroll patient in complex care management services.  Encounter Outcome:  No Answer  Jeoffrey Buffalo , RMA     [place]  [facility], [facility] Guide  Direct Dial: [phone]  Website: [email or web address]

## 2024-02-23 ENCOUNTER — Encounter: Payer: Self-pay | Admitting: Physician Assistant

## 2024-02-23 ENCOUNTER — Ambulatory Visit: Admitting: Physician Assistant

## 2024-02-23 ENCOUNTER — Telehealth: Payer: Self-pay | Admitting: Physician Assistant

## 2024-02-23 NOTE — Telephone Encounter (Signed)
 Copied from CRM [id number]. Topic: Clinical - Medication Question >> Feb 23, 2024 10:29 AM Winona SAUNDERS wrote: [facility] to follow up on fax sent to request pt medication list. Please fax to: [phone]

## 2024-02-23 NOTE — Telephone Encounter (Signed)
 This is no longer our patient. Charmaine Grooms is with [facility]. Thank you

## 2024-02-28 ENCOUNTER — Encounter: Payer: Self-pay | Admitting: Physician Assistant

## 2024-02-28 ENCOUNTER — Ambulatory Visit (INDEPENDENT_AMBULATORY_CARE_PROVIDER_SITE_OTHER): Admitting: Physician Assistant

## 2024-02-28 VITALS — BP 184/115 | HR 83 | Temp 98.4°F | Ht 61.0 in | Wt 265.0 lb

## 2024-02-28 DIAGNOSIS — I10 Essential (primary) hypertension: Secondary | ICD-10-CM

## 2024-02-28 DIAGNOSIS — M62830 Muscle spasm of back: Secondary | ICD-10-CM | POA: Diagnosis not present

## 2024-02-28 MED ORDER — TIZANIDINE HCL 4 MG PO TABS: 4.0000 mg | ORAL_TABLET | Freq: Four times a day (QID) | ORAL | 0 refills | Status: DC | PRN

## 2024-02-28 MED ORDER — HYDRALAZINE HCL 50 MG PO TABS: 50.0000 mg | ORAL_TABLET | Freq: Two times a day (BID) | ORAL | 1 refills | Status: DC

## 2024-02-28 NOTE — Assessment & Plan Note (Signed)
 Blood pressure remain uncontrolled, patent presenting today with headache, denies chest pain or visual changes. Medication reconciliation performed today with patient. Plan to increase hydralazine  to 50 mg bid. Requested update on previous referral to hypertension clinic. Blood pressure cuff prescription given to patient today. Advised low sodium diet and increased physical activity. Patient to seek care in the ER for new or worsening headache, new visual changes, chest pain, or shortness of breath. Patient to follow up with me in 2 weeks for re-evaluation.

## 2024-02-28 NOTE — Assessment & Plan Note (Signed)
 Patient presents today with chronic pain syndrome. She reports muscle spasms in her lower right lumbar region at night. Overall reassuring exam today. Very low suspicion for acute process. Tizanidine  as needed for muscle spasms. Warning signs and ER precautions discussed.

## 2024-02-28 NOTE — Progress Notes (Signed)
 Established Patient Office Visit  Subjective   Patient ID: Gwendolyn Woodward, female    DOB: 1966-02-11  Age: 58 y.o. MRN: [id number]  Chief Complaint  Patient presents with   Hypertension    2 week f/u hypertension     Patient presents today for follow up regarding hypertension. Currently on olmesartan , amlodipine , hydrochlorothiazide , and hydralazine  and states daily compliance with medications. Patient endorses headaches but denies  blurry vision, dizziness, chest pain, shortness of breath, or palpitations. Additionally, patient reports recent back spasms at bed time. She has a history of chronic pain syndrome. Relates pain is worse with standing up. Denies weakness or sensory deficits.       Review of Systems  Constitutional:  Negative for fever, malaise/fatigue and weight loss.  Eyes:  Negative for blurred vision and double vision.  Respiratory:  Negative for shortness of breath.   Cardiovascular:  Negative for chest pain.  Musculoskeletal:  Positive for back pain and joint pain. Negative for myalgias and neck pain.  Neurological:  Positive for headaches. Negative for dizziness, tingling, sensory change and weakness.      Objective:     BP (!) 184/115   Pulse 83   Temp 98.4 F (36.9 C)   Ht 5' 1 (1.549 m)   Wt 265 lb (120.2 kg)   SpO2 96%   BMI 50.07 kg/m    Physical Exam Constitutional:      Appearance: Normal appearance. She is obese.  HENT:     Head: Normocephalic.     Mouth/Throat:     Mouth: Mucous membranes are moist.     Pharynx: Oropharynx is clear.  Eyes:     Extraocular Movements: Extraocular movements intact.     Conjunctiva/sclera: Conjunctivae normal.  Cardiovascular:     Rate and Rhythm: Normal rate and regular rhythm.     Heart sounds: Normal heart sounds. No murmur heard. Pulmonary:     Effort: Pulmonary effort is normal.     Breath sounds: Normal breath sounds. No wheezing or rales.  Musculoskeletal:     Right lower leg: No edema.      Left lower leg: No edema.  Skin:    General: Skin is warm and dry.  Neurological:     General: No focal deficit present.     Mental Status: She is alert and oriented to person, place, and time.     Cranial Nerves: No cranial nerve deficit.     Sensory: No sensory deficit.     Motor: No weakness.     Gait: Gait normal.  Psychiatric:        Mood and Affect: Mood normal.        Behavior: Behavior normal.      No results found for any visits on 02/28/24.  The ASCVD Risk score (Arnett DK, et al., 2019) failed to calculate for the following reasons:   Risk score cannot be calculated because patient has a medical history suggesting prior/existing ASCVD    Assessment & Plan:   Return in about 2 weeks (around 03/13/2024) for blood pressure .   Primary hypertension Assessment & Plan: Blood pressure remain uncontrolled, patent presenting today with headache, denies chest pain or visual changes. Medication reconciliation performed today with patient. Plan to increase hydralazine  to 50 mg bid. Requested update on previous referral to hypertension clinic. Blood pressure cuff prescription given to patient today. Advised low sodium diet and increased physical activity. Patient to seek care in the ER for new or worsening  headache, new visual changes, chest pain, or shortness of breath. Patient to follow up with me in 2 weeks for re-evaluation.   Orders: -     hydrALAZINE  HCl; Take 1 tablet (50 mg total) by mouth in the morning and at bedtime.  Dispense: 60 tablet; Refill: 1  Muscle spasm of back Assessment & Plan: Patient presents today with chronic pain syndrome. She reports muscle spasms in her lower right lumbar region at night. Overall reassuring exam today. Very low suspicion for acute process. Tizanidine  as needed for muscle spasms. Warning signs and ER precautions discussed.   Orders: -     tiZANidine  HCl; Take 1 tablet (4 mg total) by mouth every 6 (six) hours as needed for muscle  spasms.  Dispense: 30 tablet; Refill: 0    Arbor Leer, PA-C

## 2024-03-01 ENCOUNTER — Ambulatory Visit: Admitting: Family Medicine

## 2024-03-13 ENCOUNTER — Encounter: Payer: Self-pay | Admitting: Physician Assistant

## 2024-03-13 ENCOUNTER — Ambulatory Visit (INDEPENDENT_AMBULATORY_CARE_PROVIDER_SITE_OTHER): Admitting: Physician Assistant

## 2024-03-13 VITALS — BP 164/88 | HR 73 | Temp 97.9°F | Ht 61.0 in | Wt 264.0 lb

## 2024-03-13 DIAGNOSIS — Z7984 Long term (current) use of oral hypoglycemic drugs: Secondary | ICD-10-CM

## 2024-03-13 DIAGNOSIS — E1165 Type 2 diabetes mellitus with hyperglycemia: Secondary | ICD-10-CM

## 2024-03-13 DIAGNOSIS — I10 Essential (primary) hypertension: Secondary | ICD-10-CM

## 2024-03-13 DIAGNOSIS — F332 Major depressive disorder, recurrent severe without psychotic features: Secondary | ICD-10-CM

## 2024-03-13 MED ORDER — SERTRALINE HCL 50 MG PO TABS: 50.0000 mg | ORAL_TABLET | Freq: Every day | ORAL | 1 refills | Status: DC

## 2024-03-13 MED ORDER — HYDRALAZINE HCL 50 MG PO TABS: 100.0000 mg | ORAL_TABLET | Freq: Two times a day (BID) | ORAL | 1 refills | Status: DC

## 2024-03-13 MED ORDER — DAPAGLIFLOZIN PROPANEDIOL 5 MG PO TABS: 5.0000 mg | ORAL_TABLET | Freq: Every day | ORAL | 0 refills | Status: DC

## 2024-03-13 NOTE — Assessment & Plan Note (Addendum)
 176/92, 164/88 Uncontrolled. Continue current medications. Increase hydralazine  to 100 mg twice daily  Discussed DASH diet and dietary sodium restrictions.  Increase dietary efforts and physical activity. Referral to cardiology for uncontrolled hypertension.  Advised ER follow up for new headaches, chest pain, shortness of breath, or visual changes.

## 2024-03-13 NOTE — Progress Notes (Signed)
 Established Patient Office Visit  Subjective   Patient ID: Gwendolyn Woodward, female    DOB: [birth year]  Age: 58 y.o. MRN: [id number]  Chief Complaint  Patient presents with   Hypertension   Nausea    Dizziness after takes metformin  twice a day    right side sciatica/ buttock pain    Patient presents today for follow up regarding hypertension. Currently on olmesartan , amlodipine , hydrochlorothiazide  combination pill and hydralazine  for blood pressure control and states daily compliance with medications. Patient denies headaches, blurry vision, , chest pain, shortness of breath, or palpitations. She does admit nausea and dizziness related to metformin  use, has been skipping dose due to these symptoms. Additionally, reports increased lack of motivation and thoughts/feelings she would be better off dead. Denies self harm or plans/intent to harm herself. States she feels this way because she has a lot to worry about right now and things have not been working out in her favor. Previously followed with a therapist, however stopped going as she did not like his questioning.      Review of Systems  Constitutional:  Negative for fever, malaise/fatigue and weight loss.  HENT:  Negative for tinnitus.   Eyes:  Negative for blurred vision and double vision.  Respiratory:  Negative for shortness of breath.   Cardiovascular:  Negative for chest pain and palpitations.  Gastrointestinal:  Positive for nausea.  Neurological:  Positive for dizziness and headaches. Negative for sensory change, focal weakness and loss of consciousness.  Psychiatric/Behavioral:  Positive for depression. Negative for suicidal ideas. The patient is not nervous/anxious.       Objective:     BP (!) 164/88   Pulse 73   Temp 97.9 F (36.6 C)   Ht 5' 1 (1.549 m)   Wt 264 lb (119.7 kg)   SpO2 98%   BMI 49.88 kg/m    Physical Exam Constitutional:      Appearance: Normal appearance. She is obese.  HENT:     Head:  Normocephalic and atraumatic.     Mouth/Throat:     Mouth: Mucous membranes are moist.     Pharynx: Oropharynx is clear.  Eyes:     Extraocular Movements: Extraocular movements intact.     Conjunctiva/sclera: Conjunctivae normal.  Cardiovascular:     Rate and Rhythm: Normal rate and regular rhythm.     Heart sounds: Normal heart sounds. No murmur heard. Pulmonary:     Effort: Pulmonary effort is normal.     Breath sounds: Normal breath sounds. No wheezing or rales.  Musculoskeletal:     Right lower leg: No edema.     Left lower leg: No edema.  Skin:    General: Skin is warm and dry.  Neurological:     General: No focal deficit present.     Mental Status: She is alert and oriented to person, place, and time.  Psychiatric:        Mood and Affect: Mood normal.        Behavior: Behavior normal.     No results found for any visits on 03/13/24.  The ASCVD Risk score (Arnett DK, et al., 2019) failed to calculate for the following reasons:   Risk score cannot be calculated because patient has a medical history suggesting prior/existing ASCVD    Assessment & Plan:   Return in about 4 weeks (around 04/10/2024) for BP.   Primary hypertension Assessment & Plan: 176/92, 164/88 Uncontrolled. Continue current medications. Increase hydralazine  to 100 mg twice  daily  Discussed DASH diet and dietary sodium restrictions.  Increase dietary efforts and physical activity. Referral to cardiology for uncontrolled hypertension.  Advised ER follow up for new headaches, chest pain, shortness of breath, or visual changes.   Orders: -     Ambulatory referral to Cardiology -     hydrALAZINE  HCl; Take 2 tablets (100 mg total) by mouth in the morning and at bedtime.  Dispense: 60 tablet; Refill: 1  Severe episode of recurrent major depressive disorder, without psychotic features [facility]) Assessment & Plan: Patient presents today with signs of severe depression. Reviewed PHQ9. Patient admits thoughts  of being better off dead but denies thoughts, intentions, or plans to harm herself. States she would have less things to worry about if she were dead. Discussed treatment options. Start 50 mg daily. Patient counseled on side effects such as nausea and headache, she was advised to stop taking medication if she experiences suicidal ideation. Referral to counseling. Patient to follow up in 1 month.   Orders: -     Sertraline  HCl; Take 1 tablet (50 mg total) by mouth daily.  Dispense: 30 tablet; Refill: 1 -     Ambulatory referral to Psychology  Type 2 diabetes mellitus with hyperglycemia, without long-term current use of insulin  [facility]) Assessment & Plan: Patient reports intolerance to metformin  due to nausea and dizziness after taking medication. Discontinue metformin . Start Farxiga  5 mg, will increase to 10mg  at follow up. Continue Ozempic .   Orders: -     Dapagliflozin  Propanediol; Take 1 tablet (5 mg total) by mouth daily.  Dispense: 30 tablet; Refill: 0    Chozen Latulippe, PA-C

## 2024-03-13 NOTE — Assessment & Plan Note (Signed)
 Patient presents today with signs of severe depression. Reviewed PHQ9. Patient admits thoughts of being better off dead but denies thoughts, intentions, or plans to harm herself. States she would have less things to worry about if she were dead. Discussed treatment options. Start 50 mg daily. Patient counseled on side effects such as nausea and headache, she was advised to stop taking medication if she experiences suicidal ideation. Referral to counseling. Patient to follow up in 1 month.

## 2024-03-13 NOTE — Assessment & Plan Note (Signed)
 Patient reports intolerance to metformin  due to nausea and dizziness after taking medication. Discontinue metformin . Start Farxiga  5 mg, will increase to 10mg  at follow up. Continue Ozempic .

## 2024-03-16 ENCOUNTER — Other Ambulatory Visit: Payer: Self-pay | Admitting: Physician Assistant

## 2024-03-16 ENCOUNTER — Encounter (HOSPITAL_BASED_OUTPATIENT_CLINIC_OR_DEPARTMENT_OTHER): Payer: Self-pay

## 2024-03-16 DIAGNOSIS — N63 Unspecified lump in unspecified breast: Secondary | ICD-10-CM

## 2024-03-21 ENCOUNTER — Other Ambulatory Visit: Payer: Self-pay

## 2024-03-21 ENCOUNTER — Encounter

## 2024-03-21 ENCOUNTER — Inpatient Hospital Stay: Admission: RE | Admit: 2024-03-21 | Source: Ambulatory Visit

## 2024-04-03 ENCOUNTER — Encounter

## 2024-04-03 ENCOUNTER — Inpatient Hospital Stay: Admission: RE | Admit: 2024-04-03 | Source: Ambulatory Visit

## 2024-04-16 ENCOUNTER — Ambulatory Visit: Admitting: Physician Assistant

## 2024-04-18 ENCOUNTER — Encounter: Payer: Self-pay | Admitting: Physician Assistant

## 2024-04-18 ENCOUNTER — Ambulatory Visit (INDEPENDENT_AMBULATORY_CARE_PROVIDER_SITE_OTHER): Admitting: Physician Assistant

## 2024-04-18 VITALS — BP 181/115 | HR 74 | Temp 97.6°F | Ht 61.0 in | Wt 268.0 lb

## 2024-04-18 DIAGNOSIS — Z5986 Financial insecurity: Secondary | ICD-10-CM | POA: Diagnosis not present

## 2024-04-18 DIAGNOSIS — F332 Major depressive disorder, recurrent severe without psychotic features: Secondary | ICD-10-CM | POA: Diagnosis not present

## 2024-04-18 DIAGNOSIS — I10 Essential (primary) hypertension: Secondary | ICD-10-CM | POA: Diagnosis not present

## 2024-04-18 MED ORDER — SPIRONOLACTONE 25 MG PO TABS: 25.0000 mg | ORAL_TABLET | Freq: Every day | ORAL | 1 refills | Status: DC

## 2024-04-18 MED ORDER — SERTRALINE HCL 100 MG PO TABS: 100.0000 mg | ORAL_TABLET | Freq: Every day | ORAL | 3 refills | Status: DC

## 2024-04-18 MED ORDER — TRAZODONE HCL 100 MG PO TABS: 100.0000 mg | ORAL_TABLET | Freq: Every day | ORAL | 1 refills | Status: DC

## 2024-04-18 MED ORDER — ATORVASTATIN CALCIUM 80 MG PO TABS: 80.0000 mg | ORAL_TABLET | Freq: Every day | ORAL | 3 refills | Status: DC

## 2024-04-18 MED ORDER — VITAMIN D3 25 MCG (1000 UT) PO CAPS: 1000.0000 [IU] | ORAL_CAPSULE | Freq: Every day | ORAL | 3 refills | Status: DC

## 2024-04-18 MED ORDER — DAPAGLIFLOZIN PROPANEDIOL 5 MG PO TABS: 5.0000 mg | ORAL_TABLET | Freq: Every day | ORAL | 0 refills | Status: DC

## 2024-04-18 MED ORDER — FUROSEMIDE 20 MG PO TABS: 20.0000 mg | ORAL_TABLET | ORAL | 2 refills | Status: DC

## 2024-04-18 MED ORDER — EZETIMIBE 10 MG PO TABS: 10.0000 mg | ORAL_TABLET | Freq: Every day | ORAL | 3 refills | Status: DC

## 2024-04-18 MED ORDER — OLMESARTAN-AMLODIPINE-HCTZ 40-10-25 MG PO TABS: 1.0000 | ORAL_TABLET | Freq: Every day | ORAL | 1 refills | Status: DC

## 2024-04-18 MED ORDER — HYDRALAZINE HCL 50 MG PO TABS: 100.0000 mg | ORAL_TABLET | Freq: Two times a day (BID) | ORAL | 1 refills | Status: DC

## 2024-04-18 NOTE — Assessment & Plan Note (Signed)
 Blood pressure remains elevated despite current medication and multiple medication adjustments. Persistent headaches reported. - Add spironolactone  to aid in blood pressure control.  - Order lab work to check electrolytes and kidney function in one week. - Schedule follow-up appointment in two weeks to reassess blood pressure. - Advise to monitor for worsening symptoms such as chest pain, shortness of breath, headache, or visual changes. - Discuss with pharmacy about setting up pill packs for improved medication compliance.

## 2024-04-18 NOTE — Assessment & Plan Note (Signed)
 Reports improvement but significant stressors persist. Depression screening remains high. - Increase Zoloft  to 100 mg once daily. - Refer to social work team for assistance with financial and housing issues. - Advise to expect a phone call from a Child psychotherapist. - Patient counseled on side effects such as nausea and headache, she was advised to stop taking medication if she experiences suicidal ideation.

## 2024-04-18 NOTE — Progress Notes (Signed)
 Established Patient Office Visit  Subjective   Patient ID: Gwendolyn Woodward, female    DOB: 12/14/1965  Age: 58 y.o. MRN: [id number]  Chief Complaint  Patient presents with   Hypertension   Discussed the use of AI scribe software for clinical note transcription with the patient, who gave verbal consent to proceed.  History of Present Illness Gwendolyn Woodward is a 58 year old female with hypertension and depression who presents for follow-up on her blood pressure and mental health management.  She feels better overall since her last visit, although her depression screening remains high. Stress related to housing issues, including the need to move and financial constraints, contributes to her depressive symptoms. She feels overwhelmed by the situation. Zoloft  was recently added to her medication regimen, and she notes a slight improvement in her mood.  Her blood pressure remains high despite previous medication adjustments. She experiences persistent headaches but denies chest pain, shortness of breath, or blurry vision. She has difficulties with medication management due to receiving excessive quantities from the pharmacy, leading to confusion and excess supply.    Review of Systems  Constitutional:  Negative for chills, fever and malaise/fatigue.  Eyes:  Negative for blurred vision and double vision.  Respiratory:  Negative for cough and shortness of breath.   Cardiovascular:  Negative for chest pain and palpitations.  Neurological:  Negative for dizziness and headaches.      Objective:     BP (!) 181/115   Pulse 74   Temp 97.6 F (36.4 C)   Ht 5' 1 (1.549 m)   Wt 268 lb (121.6 kg)   SpO2 97%   BMI 50.64 kg/m    Physical Exam Constitutional:      Appearance: Normal appearance. She is obese.  HENT:     Head: Normocephalic.     Mouth/Throat:     Mouth: Mucous membranes are moist.     Pharynx: Oropharynx is clear.  Eyes:     Extraocular Movements: Extraocular  movements intact.     Conjunctiva/sclera: Conjunctivae normal.  Cardiovascular:     Rate and Rhythm: Normal rate and regular rhythm.     Heart sounds: Normal heart sounds. No murmur heard. Pulmonary:     Effort: Pulmonary effort is normal.     Breath sounds: Normal breath sounds. No wheezing or rales.  Musculoskeletal:     Right lower leg: No edema.     Left lower leg: No edema.  Skin:    General: Skin is warm and dry.  Neurological:     General: No focal deficit present.     Mental Status: She is alert and oriented to person, place, and time.  Psychiatric:        Mood and Affect: Mood normal.        Behavior: Behavior normal.     No results found for any visits on 04/18/24.  The ASCVD Risk score (Arnett DK, et al., 2019) failed to calculate for the following reasons:   Risk score cannot be calculated because patient has a medical history suggesting prior/existing ASCVD    Assessment & Plan:   Return in about 2 weeks (around 05/02/2024) for BP.   Uncontrolled hypertension Assessment & Plan: Blood pressure remains elevated despite current medication and multiple medication adjustments. Persistent headaches reported. - Add spironolactone  to aid in blood pressure control.  - Order lab work to check electrolytes and kidney function in one week. - Schedule follow-up appointment in two weeks to reassess  blood pressure. - Advise to monitor for worsening symptoms such as chest pain, shortness of breath, headache, or visual changes. - Discuss with pharmacy about setting up pill packs for improved medication compliance.  Orders: -     Spironolactone ; Take 1 tablet (25 mg total) by mouth daily.  Dispense: 90 tablet; Refill: 1 -     Basic metabolic panel with GFR  Severe episode of recurrent major depressive disorder, without psychotic features (HCC) Assessment & Plan: Reports improvement but significant stressors persist. Depression screening remains high. - Increase Zoloft  to 100  mg once daily. - Refer to social work team for assistance with financial and housing issues. - Advise to expect a phone call from a Child psychotherapist. - Patient counseled on side effects such as nausea and headache, she was advised to stop taking medication if she experiences suicidal ideation.    Orders: -     Sertraline  HCl; Take 1 tablet (100 mg total) by mouth daily.  Dispense: 30 tablet; Refill: 3 -     AMB Referral [facility] Care Management  Financial insecurity -     AMB Referral [facility] Care Management  Other orders -     Atorvastatin  Calcium ; Take 1 tablet (80 mg total) by mouth daily.  Dispense: 90 tablet; Refill: 3 -     Vitamin D3; Take 1 capsule (1,000 Units total) by mouth daily.  Dispense: 30 capsule; Refill: 3 -     Dapagliflozin  Propanediol; Take 1 tablet (5 mg total) by mouth daily.  Dispense: 30 tablet; Refill: 0 -     Ezetimibe ; Take 1 tablet (10 mg total) by mouth daily.  Dispense: 90 tablet; Refill: 3 -     Furosemide ; Take 1 tablet (20 mg total) by mouth every other day.  Dispense: 30 tablet; Refill: 2 -     hydrALAZINE  HCl; Take 2 tablets (100 mg total) by mouth in the morning and at bedtime.  Dispense: 60 tablet; Refill: 1 -     Olmesartan -amLODIPine -HCTZ; Take 1 tablet by mouth daily.  Dispense: 90 tablet; Refill: 1 -     traZODone  HCl; Take 1 tablet (100 mg total) by mouth at bedtime.  Dispense: 60 tablet; Refill: 1   Manuella Blackson, PA-C

## 2024-04-19 ENCOUNTER — Telehealth: Payer: Self-pay

## 2024-04-19 NOTE — Progress Notes (Signed)
 Complex Care Management Note Care Guide Note  04/19/2024 Name: Gwendolyn Woodward MRN: [id number] DOB: May 01, 1966   Complex Care Management Outreach Attempts: An unsuccessful telephone outreach was attempted today to offer the patient information about available complex care management services.  Follow Up Plan:  Additional outreach attempts will be made to offer the patient complex care management information and services.   Encounter Outcome:  No Answer  Jeoffrey Buffalo , RMA     [place]  [facility], [facility] Guide  Direct Dial: [phone]  Website: [email or web address]

## 2024-04-25 LAB — BASIC METABOLIC PANEL WITH GFR
BUN/Creatinine Ratio: 23 (ref 12–28)
BUN: 23 mg/dL (ref 8–27)
CO2: 22 mmol/L (ref 20–29)
Calcium: 9.4 mg/dL (ref 8.7–10.3)
Chloride: 105 mmol/L (ref 96–106)
Creatinine, Ser: 0.99 mg/dL (ref 0.57–1.00)
Glucose: 106 mg/dL — ABNORMAL HIGH (ref 70–99)
Potassium: 4.5 mmol/L (ref 3.5–5.2)
Sodium: 144 mmol/L (ref 134–144)
eGFR: 65 mL/min/1.73 (ref >59)

## 2024-04-25 NOTE — Progress Notes (Unsigned)
 Complex Care Management Note Care Guide Note  04/25/2024 Name: Gwendolyn Woodward MRN: [id number] DOB: 16-Jun-1966   Complex Care Management Outreach Attempts: A second unsuccessful outreach was attempted today to offer the patient with information about available complex care management services.  Follow Up Plan:  Additional outreach attempts will be made to offer the patient complex care management information and services.   Encounter Outcome:  No Answer  Jeoffrey Buffalo , RMA     [place]  [facility], [facility] Guide  Direct Dial: [phone]  Website: [email or web address]

## 2024-04-26 ENCOUNTER — Ambulatory Visit: Payer: Self-pay | Admitting: Physician Assistant

## 2024-04-26 NOTE — Telephone Encounter (Signed)
-----   Message from [place] Grooms sent at 04/26/2024  8:57 AM EDT ----- Lab results reviewed and within normal limits. Follow up next week as scheduled.  ----- Message ----- From: Rebecka Memos Lab Results In Sent: 04/25/2024   5:38 AM EDT To: Charmaine Grooms, PA-C

## 2024-04-26 NOTE — Telephone Encounter (Signed)
 Left message for patient to call back regarding results.  Ok for E2C2 to give results.

## 2024-04-27 NOTE — Progress Notes (Signed)
 Complex Care Management Note Care Guide Note  04/27/2024 Name: Gwendolyn Woodward MRN: [id number] DOB: Nov 26, 1965   Complex Care Management Outreach Attempts: A third unsuccessful outreach was attempted today to offer the patient with information about available complex care management services.  Follow Up Plan:  No further outreach attempts will be made at this time. We have been unable to contact the patient to offer or enroll patient in complex care management services.  Encounter Outcome:  No Answer  Jeoffrey Buffalo , RMA     [place]  [facility], [facility] Guide  Direct Dial: [phone]  Website: [email or web address]

## 2024-05-02 ENCOUNTER — Ambulatory Visit: Admitting: Physician Assistant

## 2024-05-03 ENCOUNTER — Ambulatory Visit (INDEPENDENT_AMBULATORY_CARE_PROVIDER_SITE_OTHER): Admitting: Physician Assistant

## 2024-05-03 ENCOUNTER — Other Ambulatory Visit: Payer: Self-pay | Admitting: Physician Assistant

## 2024-05-03 ENCOUNTER — Encounter: Payer: Self-pay | Admitting: Physician Assistant

## 2024-05-03 VITALS — BP 164/95 | HR 72 | Temp 97.3°F | Ht 61.0 in | Wt 269.0 lb

## 2024-05-03 DIAGNOSIS — M62830 Muscle spasm of back: Secondary | ICD-10-CM

## 2024-05-03 DIAGNOSIS — I10 Essential (primary) hypertension: Secondary | ICD-10-CM | POA: Diagnosis not present

## 2024-05-03 MED ORDER — SPIRONOLACTONE 50 MG PO TABS: 50.0000 mg | ORAL_TABLET | Freq: Every day | ORAL | 1 refills | Status: DC

## 2024-05-03 NOTE — Progress Notes (Signed)
 Established Patient Office Visit  Subjective   Patient ID: Gwendolyn Woodward, female    DOB: 1966-02-07  Age: 58 y.o. MRN: [id number]  No chief complaint on file.   Discussed the use of AI scribe software for clinical note transcription with the patient, who gave verbal consent to proceed.  History of Present Illness Gwendolyn Woodward is a 58 year old female with hypertension who presents for follow-up on blood pressure management.  She is taking spironolactone  25 mg, added during the last visit. She is reducing salt intake and attempting weight loss but struggles to eliminate sodas. She experiences migraines for the past three days, managed with Tylenol , providing some relief. Blurry vision is present, attributed to the inability to afford new glasses, with the last eye exam earlier this year. Persistent ankle swelling and discomfort continue since a procedure involving pins, with follow-up hindered by communication issues. Occasional shortness of breath is noted, consistent with her usual pattern, without new symptoms. No chest pain.   Review of Systems  Constitutional:  Negative for fever, malaise/fatigue and weight loss.  Eyes:  Positive for blurred vision. Negative for double vision, pain and discharge.  Respiratory:  Negative for shortness of breath and wheezing.   Cardiovascular:  Negative for chest pain and palpitations.  Neurological:  Positive for headaches. Negative for dizziness.      Objective:     BP (!) 164/95   Pulse 72   Temp (!) 97.3 F (36.3 C)   Ht 5' 1 (1.549 m)   Wt 269 lb (122 kg)   SpO2 98%   BMI 50.83 kg/m    Physical Exam Constitutional:      Appearance: Normal appearance. She is obese.  HENT:     Head: Normocephalic and atraumatic.     Mouth/Throat:     Mouth: Mucous membranes are moist.     Pharynx: Oropharynx is clear.  Eyes:     Extraocular Movements: Extraocular movements intact.     Conjunctiva/sclera: Conjunctivae normal.   Cardiovascular:     Rate and Rhythm: Normal rate and regular rhythm.     Heart sounds: Normal heart sounds. No murmur heard. Pulmonary:     Effort: Pulmonary effort is normal.     Breath sounds: Normal breath sounds. No wheezing or rales.  Musculoskeletal:     Right lower leg: No edema.     Left lower leg: No edema.  Skin:    General: Skin is warm and dry.  Neurological:     General: No focal deficit present.     Mental Status: She is alert and oriented to person, place, and time.     Cranial Nerves: No cranial nerve deficit.  Psychiatric:        Mood and Affect: Mood normal.        Behavior: Behavior normal.    No results found for any visits on 05/03/24.  The ASCVD Risk score (Arnett DK, et al., 2019) failed to calculate for the following reasons:   Risk score cannot be calculated because patient has a medical history suggesting prior/existing ASCVD    Assessment & Plan:   Return in about 2 weeks (around 05/17/2024) for BP.   Uncontrolled hypertension Assessment & Plan: Blood pressure elevated but improved. Headaches and visual changes likely due to hypertension. Spironolactone  shows some improvement. Normal electrolytes on recent BMP. - Increase spironolactone  to 50 mg daily. - Order blood work in one week to monitor electrolytes. - Schedule follow-up appointment in two  weeks for blood pressure evaluation. - Check on cardiology referral. - Discussed reducing salt intake and soda consumption for blood pressure and weight management. - Warning signs such as new or worsening chest pain, shortness of breath, headache, visual changes, or syncope discussed.   Orders: -     Spironolactone ; Take 1 tablet (50 mg total) by mouth daily.  Dispense: 30 tablet; Refill: 1 -     Basic metabolic panel with GFR   Charmaine Katniss Weedman, PA-C

## 2024-05-03 NOTE — Assessment & Plan Note (Signed)
 Blood pressure elevated but improved. Headaches and visual changes likely due to hypertension. Spironolactone  shows some improvement. Normal electrolytes on recent BMP. - Increase spironolactone  to 50 mg daily. - Order blood work in one week to monitor electrolytes. - Schedule follow-up appointment in two weeks for blood pressure evaluation. - Check on cardiology referral. - Discussed reducing salt intake and soda consumption for blood pressure and weight management. - Warning signs such as new or worsening chest pain, shortness of breath, headache, visual changes, or syncope discussed.

## 2024-05-07 ENCOUNTER — Telehealth: Payer: Self-pay | Admitting: Pharmacy Technician

## 2024-05-07 ENCOUNTER — Other Ambulatory Visit (HOSPITAL_COMMUNITY): Payer: Self-pay

## 2024-05-07 NOTE — Telephone Encounter (Signed)
 Pharmacy Patient Advocate Encounter   Received notification from Onbase that prior authorization for Dapagliflozin  Propanediol 5mg  tablets is required/requested.   Insurance verification completed.   The patient is insured through [employer] .   Per test claim:  Brand name Farxiga  5mg  tablets is preferred by the insurance.  If suggested medication is appropriate, Please send in a new RX and discontinue this one. If not, please advise as to why it's not appropriate so that we may request a Prior Authorization. Please note, some preferred medications may still require a PA.  If the suggested medications have not been trialed and there are no contraindications to their use, the PA will not be submitted, as it will not be approved.  New prescription is not required. Meds are interchangeable at the pharmacy as brand name preferred. Pharmacy dispensed brand name on 05/03/2024. Next refill is due on or after 05/26/2024.

## 2024-05-17 ENCOUNTER — Encounter: Payer: Self-pay | Admitting: Physician Assistant

## 2024-05-17 ENCOUNTER — Ambulatory Visit (INDEPENDENT_AMBULATORY_CARE_PROVIDER_SITE_OTHER): Admitting: Physician Assistant

## 2024-05-17 VITALS — BP 173/99 | HR 84 | Wt 267.0 lb

## 2024-05-17 DIAGNOSIS — I10 Essential (primary) hypertension: Secondary | ICD-10-CM

## 2024-05-17 NOTE — Progress Notes (Signed)
 Established Patient Office Visit  Subjective   Patient ID: Gwendolyn Woodward, female    DOB: [birth year]  Age: 58 y.o. MRN: [id number]  Chief Complaint  Patient presents with   Hypertension    Pt ankle hurt and headaches due to BP    Discussed the use of AI scribe software for clinical note transcription with the patient, who gave verbal consent to proceed.  History of Present Illness Gwendolyn Woodward is a 58 year old female with hypertension who presents for blood pressure management.  She experiences difficulty controlling her blood pressure despite taking multiple antihypertensive medications. One medication causes nausea and dizziness, leading her to delay taking it until she is home, which may contribute to elevated daytime readings. She also takes medications for cholesterol, blood sugar, a muscle relaxer, a sleep aid, and a vitamin D  supplement.  She has frequent headaches and occasional blurred vision. She denies chest pain or shortness of breath.  She experiences pain in her ankle and leg at night.  She is under significant stress due to recent family deaths, which she believes may contribute to her elevated blood pressure.     Review of Systems  Constitutional:  Negative for chills, fever and malaise/fatigue.  Eyes:  Positive for blurred vision. Negative for double vision.  Respiratory:  Negative for cough and shortness of breath.   Cardiovascular:  Negative for chest pain and palpitations.  Musculoskeletal:  Positive for joint pain. Negative for myalgias.  Neurological:  Positive for headaches. Negative for dizziness.  Psychiatric/Behavioral:  Negative for depression.       Objective:     BP (!) 173/99   Pulse 84   Wt 267 lb (121.1 kg)   SpO2 100%   BMI 50.45 kg/m    Physical Exam Constitutional:      Appearance: Normal appearance. She is obese.  HENT:     Head: Normocephalic.     Mouth/Throat:     Mouth: Mucous membranes are moist.     Pharynx:  Oropharynx is clear.  Eyes:     Extraocular Movements: Extraocular movements intact.     Conjunctiva/sclera: Conjunctivae normal.  Cardiovascular:     Rate and Rhythm: Normal rate and regular rhythm.     Heart sounds: Normal heart sounds. No murmur heard. Pulmonary:     Effort: Pulmonary effort is normal.     Breath sounds: Normal breath sounds. No wheezing or rales.  Musculoskeletal:     Right lower leg: No edema.     Left lower leg: No edema.  Skin:    General: Skin is warm and dry.  Neurological:     General: No focal deficit present.     Mental Status: She is alert and oriented to person, place, and time.  Psychiatric:        Mood and Affect: Mood normal.        Behavior: Behavior normal.      No results found for any visits on 05/17/24.  The ASCVD Risk score (Arnett DK, et al., 2019) failed to calculate for the following reasons:   Risk score cannot be calculated because patient has a medical history suggesting prior/existing ASCVD    Assessment & Plan:   Return in about 4 weeks (around 06/14/2024) for BP.   Uncontrolled hypertension Assessment & Plan: Persistent hypertension with headache and blurred vision likely due to elevated blood pressure. Non-adherence to medication due to confusion. Stress and bereavement may contribute. - Monitor blood pressure at home, maintain  log. - Continue current antihypertensives. - Refer to cardiology/hypertension clinic. - Coordinate with pharmacist for medication adherence. - Instructed to visit [facility] for pill pack assistance. - Warning signs such as shortness of breath, chest pain, worsening headache, or syncope discussed.   Orders: -     AMB Referral VBCI Care Management -     Ambulatory referral to Advanced Hypertension Clinic    Fruitland Laneice Meneely, PA-C

## 2024-05-17 NOTE — Assessment & Plan Note (Signed)
 Persistent hypertension with headache and blurred vision likely due to elevated blood pressure. Non-adherence to medication due to confusion. Stress and bereavement may contribute. - Monitor blood pressure at home, maintain log. - Continue current antihypertensives. - Refer to cardiology/hypertension clinic. - Coordinate with pharmacist for medication adherence. - Instructed to visit [facility] for pill pack assistance. - Warning signs such as shortness of breath, chest pain, worsening headache, or syncope discussed.

## 2024-05-18 ENCOUNTER — Telehealth: Payer: Self-pay

## 2024-05-18 ENCOUNTER — Ambulatory Visit

## 2024-05-18 NOTE — Progress Notes (Signed)
 Care Guide Pharmacy Note  05/18/2024 Name: Gwendolyn Woodward MRN: [id number] DOB: 04/04/1966  Referred By: Mancil Pfeiffer, PA-C Reason for referral: Complex Care Management (Outreach to schedule with Pharm d )   Gwendolyn Woodward is a 58 y.o. year old female who is a primary care patient of Grooms, Pfeiffer, [place].  Gwendolyn Woodward was referred to the pharmacist for assistance related to: DMII  Successful contact was made with the patient to discuss pharmacy services including being ready for the pharmacist to call at least 5 minutes before the scheduled appointment time and to have medication bottles and any blood pressure readings ready for review. The patient agreed to meet with the pharmacist via telephone visit on (date/time).05/21/2024  Jeoffrey Buffalo , RMA     [place]  [facility], [facility] Guide  Direct Dial: [phone]  Website: [email or web address]

## 2024-05-21 ENCOUNTER — Other Ambulatory Visit

## 2024-05-21 DIAGNOSIS — I10 Essential (primary) hypertension: Secondary | ICD-10-CM

## 2024-05-21 NOTE — Progress Notes (Signed)
 05/21/2024 Name: Gwendolyn Woodward MRN: [id number] DOB: 05/19/1966  Chief Complaint  Patient presents with   Hypertension    Gwendolyn Woodward is a 58 y.o. year old female who presented for a telephone visit.   They were referred to the pharmacist by their PCP for assistance in managing hypertension.    Subjective:  Patient concerned about resistant hypertension.  She is on 5 medications for blood pressure and her BP is still elevated.  Her daughter assists her with her medications, however compliance may an issue.  Spironolactone  was most recently added in August.  She is scheduled to see advanced hypertension clinic in October  Care Team: Primary Care Provider: Grooms, Charmaine RIGGERS ;   Medication Access/Adherence  Current Pharmacy:  [facility] - [place], [place] - [address] [place] [address] Phone: [phone] Fax: [phone]  [id number] - [place], [place] - [address] AT [facility] [address] [place] [address] Phone: [phone] Fax: [phone]  [facility] - [address], [place] - [address] [place] [address] Phone: [phone] Fax: [phone]  [facility] - [place], [place] - [address] [place] [address] Phone: [phone] Fax: [phone]   Patient reports affordability concerns with their medications: No  Patient reports access/transportation concerns to their pharmacy: No  Patient reports adherence concerns with their medications:  Yes  polypharmacy   Hypertension:  Current medications: hydralazine , olmesartan /hydrochlorothiazide /amlodipine , spironolactone , furosemide  Medications previously tried:   Patient has a validated, automated, upper arm home BP cuff Current blood pressure readings readings: 170-200s-90-100s HR 80-90s  Patient denies hypotensive s/sx including dizziness, lightheadedness.  Patient  reports hypertensive symptoms including headache  Current meal patterns: encouraged low salt  Current physical activity: encouraged as able   Objective:  Lab Results  Component Value Date   HGBA1C 6.4 (H) 01/09/2024    Lab Results  Component Value Date   CREATININE 0.99 04/24/2024   BUN 23 04/24/2024   NA 144 04/24/2024   K 4.5 04/24/2024   CL 105 04/24/2024   CO2 22 04/24/2024    Lab Results  Component Value Date   CHOL 202 (H) 01/09/2024   HDL 67 01/09/2024   LDLCALC 106 (H) 01/09/2024   TRIG 172 (H) 01/09/2024   CHOLHDL 3.0 01/09/2024    Medications Reviewed Today     Reviewed by Billee Mliss BIRCH, [facility] (Pharmacist) on 05/21/24 at 1031  Med List Status: <None>   Medication Order Taking? Sig Documenting Provider Last Dose Status Informant  ACCU-CHEK GUIDE test strip [id number]   [provider]  Active   Accu-Chek Softclix Lancets lancets [id number]  Once daily testing dx e11.9 Paseda, Folashade R, FNP  Active   atorvastatin  (LIPITOR) 80 MG tablet [id number]  Take 1 tablet (80 mg total) by mouth daily. Grooms, [place], [place]  Active   Cholecalciferol  (VITAMIN D3) 25 MCG (1000 UT) CAPS [id number]  Take 1 capsule (1,000 Units total) by mouth daily. Grooms, [place], [place]  Active   dapagliflozin  propanediol (FARXIGA ) 5 MG TABS tablet [id number]  Take 1 tablet (5 mg total) by mouth daily. Grooms, Charmaine, [place]  Active   ezetimibe  (ZETIA ) 10 MG tablet [id number]  Take 1 tablet (10 mg total) by mouth daily. Grooms, [place], [place]  Active   furosemide  (LASIX ) 20 MG tablet [id number]  Take 1 tablet (20 mg total) by mouth every other day. Grooms, [place], [place]  Active   hydrALAZINE  (APRESOLINE )  50 MG tablet [id number]  Take 2 tablets (100 mg total) by mouth in the morning and at bedtime. Grooms, [place], [place]  Active   Multiple Vitamin (MULTIVITAMIN) tablet [id number]  Take 1 tablet by mouth daily. [provider]  Active   Olmesartan -amLODIPine -HCTZ 40-10-25 MG TABS  [id number]  Take 1 tablet by mouth daily. Grooms, [place], [place]  Active   Semaglutide ,0.25 or 0.5MG /DOS, (OZEMPIC , 0.25 OR 0.5 MG/DOSE,) 2 MG/3ML SOPN [id number]  Inject 0.5 mg into the skin once a week. DX:E11.65 Zarw[place], Gloria, FNP  Active   sertraline  (ZOLOFT ) 100 MG tablet [id number]  Take 1 tablet (100 mg total) by mouth daily. Grooms, Charmaine, [place]  Active   spironolactone  (ALDACTONE ) 50 MG tablet [id number]  Take 1 tablet (50 mg total) by mouth daily. Grooms, Charmaine, [place]  Active   tiZANidine  (ZANAFLEX ) 4 MG tablet [id number]  Take 1 tablet (4 mg total) by mouth every 6 (six) hours as needed for muscle spasms. Grooms, [place], [place]  Active   traZODone  (DESYREL ) 100 MG tablet [id number]  Take 1 tablet (100 mg total) by mouth at bedtime. Grooms, [place], [place]  Active               Assessment/Plan:   Hypertension: - Currently uncontrolled - Reviewed long term cardiovascular and renal outcomes of uncontrolled blood pressure - Reviewed appropriate blood pressure monitoring technique and reviewed goal blood pressure. Recommended to check home blood pressure and heart rate daily or if symptomatic (brand new cuff from [employer]) -Reviewed all medication bottles from [place] - Recommended patient restart her medications for blood pressure  Patient has been non-compliant with BP meds due to fear of dizziness.  She cannot recall which pill makes her feel dizzy.   -Encouraged patient to take medications to [employer] for pill packaging  -BP 274/136, HR 89; discussed with PCP the need for patient to go to ED; she declined    Follow Up Plan: 3 weeks; Advanced HTN clinic on 10/9  Mliss Tarry Griffin, PharmD, BCACP, CPP Clinical Pharmacist, [facility]

## 2024-05-22 ENCOUNTER — Telehealth: Payer: Self-pay | Admitting: Pharmacist

## 2024-05-22 NOTE — Telephone Encounter (Signed)
    Unsuccessful outreach to patient today re: blood pressure.  Left VM encouraging return call.  Trayce Caravello Dattero Cloie Wooden, PharmD, BCACP, CPP Clinical Pharmacist, [facility]

## 2024-05-23 ENCOUNTER — Other Ambulatory Visit: Payer: Self-pay | Admitting: Physician Assistant

## 2024-05-23 DIAGNOSIS — F332 Major depressive disorder, recurrent severe without psychotic features: Secondary | ICD-10-CM

## 2024-05-23 DIAGNOSIS — I10 Essential (primary) hypertension: Secondary | ICD-10-CM

## 2024-05-31 ENCOUNTER — Institutional Professional Consult (permissible substitution) (HOSPITAL_BASED_OUTPATIENT_CLINIC_OR_DEPARTMENT_OTHER): Admitting: Family

## 2024-06-14 ENCOUNTER — Telehealth: Payer: Self-pay

## 2024-06-14 NOTE — Telephone Encounter (Signed)
 Copied from CRM [id number]. Topic: Clinical - Medication Prior Auth >> Jun 14, 2024 10:25 AM Harlene ORN wrote: Reason for CRM: [facility] Patient applied for their Chronic Conditions Special Needs Plan, wants to clarify with the PCP requires at least one chronic condition. Phone: [phone]

## 2024-06-15 ENCOUNTER — Ambulatory Visit: Admitting: Physician Assistant

## 2024-06-15 ENCOUNTER — Encounter: Payer: Self-pay | Admitting: Physician Assistant

## 2024-06-15 VITALS — BP 152/101 | HR 83 | Temp 98.8°F | Ht 61.0 in | Wt 261.0 lb

## 2024-06-15 DIAGNOSIS — M5441 Lumbago with sciatica, right side: Secondary | ICD-10-CM | POA: Diagnosis not present

## 2024-06-15 DIAGNOSIS — I10 Essential (primary) hypertension: Secondary | ICD-10-CM

## 2024-06-15 DIAGNOSIS — G8929 Other chronic pain: Secondary | ICD-10-CM

## 2024-06-15 MED ORDER — NAPROXEN 500 MG PO TABS: 500.0000 mg | ORAL_TABLET | Freq: Two times a day (BID) | ORAL | 1 refills | Status: DC
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
## 2024-06-15 NOTE — Patient Instructions (Signed)
 [facility] [address] [place] [address] [phone]

## 2024-06-15 NOTE — Assessment & Plan Note (Signed)
 Hypertension control minimally improved since last visit. Discussed importance of medication compliance. Number provided to [facility] cardiology team for additional management of hypertension. Concerns with adherence and memory loss complicate treatment. I have requested patient present with her daughter for her next appointment.

## 2024-06-15 NOTE — Assessment & Plan Note (Addendum)
 Chronic right hip pain radiating down the leg, partially relieved by sitting. Do not feel comfortable with narcotic pain medication use in the patient due to concerns for easy confusion and her complaints of memory troubles. Mild right sides paraspinal muscular tenderness, no acute neruo signs on exam.  - Naproxen  500 mg prescribed twice daily. Continue Tylenol  500 mg, two tablets, no more than three times a day. - Warning signs and ER precautions discussed.  - Patient has previously been referred to pain management, unsure if appointment has ever been made, patient difficult to follow in terms of history.

## 2024-06-17 DIAGNOSIS — M26609 Unspecified temporomandibular joint disorder, unspecified side: Secondary | ICD-10-CM | POA: Diagnosis not present

## 2024-07-04 ENCOUNTER — Encounter (HOSPITAL_COMMUNITY): Payer: Self-pay

## 2024-07-04 ENCOUNTER — Ambulatory Visit (INDEPENDENT_AMBULATORY_CARE_PROVIDER_SITE_OTHER): Admitting: Clinical

## 2024-07-04 DIAGNOSIS — F419 Anxiety disorder, unspecified: Secondary | ICD-10-CM | POA: Diagnosis not present

## 2024-07-04 DIAGNOSIS — F331 Major depressive disorder, recurrent, moderate: Secondary | ICD-10-CM

## 2024-07-04 NOTE — Progress Notes (Signed)
 IN PERSON   I connected with Gwendolyn Woodward on 07/04/24 at  9:00 AM EST in person and verified that I am speaking with the correct person using two identifiers.  Location: Patient: office Provider: office    I discussed the limitations of evaluation and management by telemedicine and the availability of in person appointments. The patient expressed understanding and agreed to proceed. ( IN PERSON )    Comprehensive Clinical Assessment (CCA) Note  07/04/2024 Gwendolyn Woodward [id number]  Chief Complaint:  Depression and Anxiety  Visit Diagnosis: Recurrent Moderate MDD with Anxiety     CCA Screening, Triage and Referral (STR)  Patient Reported Information How did you hear about us ? No data recorded Referral name: No data recorded Referral phone number: No data recorded  Whom do you see for routine medical problems? No data recorded Practice/Facility Name: No data recorded Practice/Facility Phone Number: No data recorded Name of Contact: No data recorded Contact Number: No data recorded Contact Fax Number: No data recorded Prescriber Name: No data recorded Prescriber Address (if known): No data recorded  What Is the Reason for Your Visit/Call Today? No data recorded How Long Has This Been Causing You Problems? No data recorded What Do You Feel Would Help You the Most Today? No data recorded  Have You Recently Been in Any Inpatient Treatment (Hospital/Detox/Crisis Center/28-Day Program)? No data recorded Name/Location of Program/Hospital:No data recorded How Long Were You There? No data recorded When Were You Discharged? No data recorded  Have You Ever Received Services From [facility] Before? No data recorded Who Do You See at [facility]? No data recorded  Have You Recently Had Any Thoughts About Hurting Yourself? No data recorded Are You Planning to Commit Suicide/Harm Yourself At This time? No data recorded  Have you Recently Had Thoughts About Hurting Someone  [place]? No data recorded Explanation: No data recorded  Have You Used Any Alcohol or Drugs in the Past 24 Hours? No data recorded How Long Ago Did You Use Drugs or Alcohol? No data recorded What Did You Use and How Much? No data recorded  Do You Currently Have a Therapist/Psychiatrist? No data recorded Name of Therapist/Psychiatrist: No data recorded  Have You Been Recently Discharged From Any Office Practice or Programs? No data recorded Explanation of Discharge From Practice/Program: No data recorded    CCA Screening Triage Referral Assessment Type of Contact: No data recorded Is this Initial or Reassessment? No data recorded Date Telepsych consult ordered in [facility]:  No data recorded Time Telepsych consult ordered in [facility]:  No data recorded  Patient Reported Information Reviewed? No data recorded Patient Left Without Being Seen? No data recorded Reason for Not Completing Assessment: No data recorded  Collateral Involvement: No data recorded  Does Patient Have a Court Appointed Legal Guardian? No data recorded Name and Contact of Legal Guardian: No data recorded If Minor and Not Living with Parent(s), Who has Custody? No data recorded Is CPS involved or ever been involved? No data recorded Is APS involved or ever been involved? No data recorded  Patient Determined To Be At Risk for Harm To Self or Others Based on Review of Patient Reported Information or Presenting Complaint? No data recorded Method: No data recorded Availability of Means: No data recorded Intent: No data recorded Notification Required: No data recorded Additional Information for Danger to Others Potential: No data recorded Additional Comments for Danger to Others Potential: No data recorded Are There Guns or Other Weapons in Your Home?  No data recorded Types of Guns/Weapons: No data recorded Are These Weapons Safely Secured?                            No data recorded Who Could Verify You Are Able To Have These  Secured: No data recorded Do You Have any Outstanding Charges, Pending Court Dates, Parole/Probation? No data recorded Contacted To Inform of Risk of Harm To Self or Others: No data recorded  Location of Assessment: No data recorded  Does Patient Present under Involuntary Commitment? No data recorded IVC Papers Initial File Date: No data recorded  [place] of Residence: No data recorded  Patient Currently Receiving the Following Services: No data recorded  Determination of Need: No data recorded  Options For Referral: No data recorded    CCA Biopsychosocial Intake/Chief Complaint:  The patient was referred through her PCP at [facility] and sees Charmaine Grooms with indication of difficulty with mood management  Current Symptoms/Problems: The patient identifies difficulty with mood management, irratibility, and anger.   Patient Reported Schizophrenia/Schizoaffective Diagnosis in Past: No   Strengths: Likes to help other people  Preferences: The patient notes taking care of her little dog and going to take him walking. The patient notes caregiving for a friend who lives close  Abilities: The patient enjoys getting out but notes its limited in [facility] . The pateint enjoys involvement at her church.   Type of Services Patient Feels are Needed: The patient is currently prescribed Sertraline  for Depression and Trazadone for sleep through her PCP / Individual Therapy   Initial Clinical Notes/Concerns: The patient has not had any involvement in counseling within the past year. The patient notes no recent hospitalizations in relationship to her MH. No current S/I or H/I   Mental Health Symptoms Depression:  Change in energy/activity; Fatigue; Irritability; Difficulty Concentrating; Hopelessness; Tearfulness; Sleep (too much or little); Weight gain/loss; Worthlessness; Increase/decrease in appetite (The patient is taking a weight loss medicine.)   Duration of  Depressive symptoms: Greater than two weeks   Mania:  None   Anxiety:   Worrying; Tension; Difficulty concentrating; Sleep; Irritability; Fatigue   Psychosis:  None   Duration of Psychotic symptoms: NA  Trauma:  None   Obsessions:  None   Compulsions:  None   Inattention:  None   Hyperactivity/Impulsivity:  None   Oppositional/Defiant Behaviors:  None   Emotional Irregularity:  None   Other Mood/Personality Symptoms:  Difficulty with mood regulation and anger   Mental Status Exam Appearance and self-care  Stature:  Average   Weight:  Overweight   Clothing:  Casual   Grooming:  Normal   Cosmetic use:  None   Posture/gait:  Normal   Motor activity:  Not Remarkable   Sensorium  Attention:  Normal   Concentration:  Focuses on irrelevancies   Orientation:  X5   Recall/memory:  Defective in Short-term   Affect and Mood  Affect:  Appropriate   Mood:  Depressed; Irritable   Relating  Eye contact:  Normal   Facial expression:  Anxious   Attitude toward examiner:  Cooperative   Thought and Language  Speech flow: Normal   Thought content:  Appropriate to Mood and Circumstances   Preoccupation:  None   Hallucinations:  None   Organization:  Logical   Company Secretary of Knowledge:  Good   Intelligence:  Average   Abstraction:  Normal   Judgement:  Good  Reality Testing:  Realistic   Insight:  Good   Decision Making:  Normal   Social Functioning  Social Maturity:  Isolates   Social Judgement:  Normal   Stress  Stressors:  Grief/losses; Illness; Transitions; Financial (Pts cousin just passed and her sister just passed as well within the past month.)   Coping Ability:  Normal   Skill Deficits:  None   Supports:  Church     Religion: Religion/Spirituality Are You A Religious Person?: Yes What is Your Religious Affiliation?: Baptist How Might This Affect Treatment?: Protective Factor  Leisure/Recreation: Leisure /  Recreation Do You Have Hobbies?: No  Exercise/Diet: Exercise/Diet Do You Exercise?: Yes What Type of Exercise Do You Do?: Run/Walk How Many Times a Week Do You Exercise?: 1-3 times a week Have You Gained or Lost A Significant Amount of Weight in the Past Six Months?: Yes-Gained Number of Pounds Gained: 10 Do You Follow a Special Diet?: No Do You Have Any Trouble Sleeping?: Yes Explanation of Sleeping Difficulties: Difficulty with sleep pattern being irratic currently taking Trazdone.   CCA Employment/Education Employment/Work Situation: Employment / Work Systems Developer: On disability Why is Patient on Disability: Physical How Long has Patient Been on Disability: since 1984 after pateint had a stroke Patient's Job has Been Impacted by Current Illness: No What is the Longest Time Patient has Held a Job?: 5/6 years Where was the Patient Employed at that Time?: [employer] Has Patient ever Been in the [employer]?: No  Education: Education Is Patient Currently Attending School?: No Last Grade Completed: 12 Name of High School: [employer] Did Garment/textile Technologist From [employer]?: No Did Theme Park Manager?: No Did Designer, Television/film Set?: No Did You Have An Individualized Education Program (IIEP): No Did You Have Any Difficulty At School?: No Patient's Education Has Been Impacted by Current Illness: No   CCA Family/Childhood History Family and Relationship History: Family history Marital status: Divorced Divorced, when?: The patient notes she has been divorced for around 87yrs. What types of issues is patient dealing with in the relationship?: none Additional relationship information: No Additional Are you sexually active?: No What is your sexual orientation?: Heterosexual Has your sexual activity been affected by drugs, alcohol, medication, or emotional stress?: NA Does patient have children?: Yes How many children?: 2 How is patient's  relationship with their children?: The patient notes having 2 childrens and notes having a son and a daughter. The patient notes having a good realtionship with both of her kids.  Childhood History:  Childhood History By whom was/is the patient raised?: Both parents Additional childhood history information: No Additonal Description of patient's relationship with caregiver when they were a child: Good with both Patient's description of current relationship with people who raised him/her: The patient notes both parents have passed and are deceased How were you disciplined when you got in trouble as a child/adolescent?: Grounding Does patient have siblings?: Yes Number of Siblings: 5 Description of patient's current relationship with siblings: The patient notes all of her siblings besides 1 sister she has never interacted with are deceased. Did patient suffer any verbal/emotional/physical/sexual abuse as a child?: No Did patient suffer from severe childhood neglect?: No Has patient ever been sexually abused/assaulted/raped as an adolescent or adult?: No Was the patient ever a victim of a crime or a disaster?: No Witnessed domestic violence?: No Has patient been affected by domestic violence as an adult?: No  Child/Adolescent Assessment:     CCA Substance Use  Alcohol/Drug Use: Alcohol / Drug Use Pain Medications: None Prescriptions: See MAR Over the Counter: Tylonol History of alcohol / drug use?: No history of alcohol / drug abuse                         ASAM's:  Six Dimensions of Multidimensional Assessment  Dimension 1:  Acute Intoxication and/or Withdrawal Potential:      Dimension 2:  Biomedical Conditions and Complications:      Dimension 3:  Emotional, Behavioral, or Cognitive Conditions and Complications:     Dimension 4:  Readiness to Change:     Dimension 5:  Relapse, Continued use, or Continued Problem Potential:     Dimension 6:  Recovery/Living  Environment:     ASAM Severity Score:    ASAM Recommended Level of Treatment:     Substance use Disorder (SUD)    Recommendations for Services/Supports/Treatments: Recommendations for Services/Supports/Treatments Recommendations For Services/Supports/Treatments: Medication Management, Individual Therapy  DSM5 Diagnoses: Patient Active Problem List   Diagnosis Date Noted   Severe episode of recurrent major depressive disorder, without psychotic features (HCC) 03/13/2024   Mixed stress and urge urinary incontinence 01/23/2024   Type 2 diabetes mellitus with hyperglycemia (HCC) 12/16/2022   Vitamin D  deficiency 09/24/2021   Morbid obesity (HCC) 09/10/2021   Hyperlipidemia 05/02/2013   Chronic right-sided low back pain with right-sided sciatica 12/27/2012   Migraine headache without aura 05/24/2012   Gastroesophageal reflux disease    Sleep apnea    Uncontrolled hypertension    History of CVA (cerebrovascular accident)     Patient Centered Plan: Patient is on the following Treatment Plan(s):  Recurrent Moderate MDD with Anxiety    Referrals to Alternative Service(s): Referred to Alternative Service(s):   Place:   Date:   Time:    Referred to Alternative Service(s):   Place:   Date:   Time:    Referred to Alternative Service(s):   Place:   Date:   Time:    Referred to Alternative Service(s):   Place:   Date:   Time:      Collaboration of Care: No Additional collaboration for this session.   Patient/Guardian was advised Release of Information must be obtained prior to any record release in order to collaborate their care with an outside provider. Patient/Guardian was advised if they have not already done so to contact the registration department to sign all necessary forms in order for us  to release information regarding their care.   Consent: Patient/Guardian gives verbal consent for treatment and assignment of benefits for services provided during this visit. Patient/Guardian  expressed understanding and agreed to proceed.    I discussed the assessment and treatment plan with the patient. The patient was provided an opportunity to ask questions and all were answered. The patient agreed with the plan and demonstrated an understanding of the instructions.   The patient was advised to call back or seek an in-person evaluation if the symptoms worsen or if the condition fails to improve as anticipated.  I provided 45 minutes of face-to-face time during this encounter.   Jerel ONEIDA Pepper, LCSW   07/04/2024

## 2024-07-11 ENCOUNTER — Other Ambulatory Visit: Payer: Self-pay | Admitting: Family Medicine

## 2024-07-11 DIAGNOSIS — E119 Type 2 diabetes mellitus without complications: Secondary | ICD-10-CM

## 2024-07-13 ENCOUNTER — Ambulatory Visit

## 2024-07-26 ENCOUNTER — Ambulatory Visit: Payer: Self-pay

## 2024-07-26 NOTE — Telephone Encounter (Signed)
 FYI Only or Action Required?: FYI only for provider: appointment scheduled on 08/03/24.  Patient was last seen in primary care on 06/15/2024 by Grooms, [place]athalie, [place].  Called Nurse Triage reporting Chest Pain.  Symptoms began several days ago.  Interventions attempted: OTC medications: tylenol  and Prescription medications: HTN medication.  Symptoms are: unchanged.  Triage Disposition: See Physician Within 24 Hours  Patient/caregiver understands and will follow disposition?: Yes  Copied from CRM #[id number]. Topic: Clinical - Red Word Triage >> Jul 26, 2024  1:38 PM Avram MATSU wrote: Red Word that prompted transfer to Nurse Triage: chest pain & headaches   Reason for Disposition  [1] Chest pain lasts < 5 minutes AND [2] NO chest pain or cardiac symptoms (e.g., breathing difficulty, sweating) now  (Exception: Chest pains that last only a few seconds.)  Answer Assessment - Initial Assessment Questions Pt called in stating she is having intermittent chest pain at midline between her breasts. She denies any SOB or current distress. She states that this has occurred daily for 2 days with a headache that is relieved by tylenol . Pt reports she does not take her BP because it is always high so when she gets the chest pain, she takes her BP medication and tylenol  and pain goes away. Discussed importance of monitoring BP levels and red flag symptoms. Pt voiced understanding. Appointment scheduled for evaluation; soonest available, pt has appt scheduled for end of December but discussed importance of moving to sooner. Patient agrees with plan of care, and will call back if anything changes, or if symptoms worsen.     1. LOCATION: Where does it hurt?       Middle of chest between breasts  2. RADIATION: Does the pain go anywhere else? (e.g., into neck, jaw, arms, back)     None   3. ONSET: When did the chest pain begin? (Minutes, hours or days)      2 days ago   4. PATTERN: Does the pain  come and go, or has it been constant since it started?  Does it get worse with exertion?      Comes and goes;   5. DURATION: How long does it last (e.g., seconds, minutes, hours)     A couple minutes   6. SEVERITY: How bad is the pain?  (e.g., Scale 1-10; mild, moderate, or severe)     5-6   7. CARDIAC RISK FACTORS: Do you have any history of heart problems or risk factors for heart disease? (e.g., angina, prior heart attack; diabetes, high blood pressure, high cholesterol, smoker, or strong family history of heart disease)     HTN   8. PULMONARY RISK FACTORS: Do you have any history of lung disease?  (e.g., blood clots in lung, asthma, emphysema, birth control pills)     Hx of blood clot 1984  9. CAUSE: What do you think is causing the chest pain?     Unknown   10. OTHER SYMPTOMS: Do you have any other symptoms? (e.g., dizziness, nausea, vomiting, sweating, fever, difficulty breathing, cough)       H/a  Protocols used: Chest Pain-A-AH

## 2024-08-01 NOTE — Telephone Encounter (Signed)
 Woodward, Gwendolyn, [place]      08/01/24 11:19 AM Advise ER care if chest pain persists. BP is always very high when I see her.

## 2024-08-01 NOTE — Telephone Encounter (Signed)
 Left message to return call- may tell patient provider's recomendations

## 2024-08-03 ENCOUNTER — Ambulatory Visit: Admitting: Physician Assistant

## 2024-08-03 ENCOUNTER — Encounter: Payer: Self-pay | Admitting: Physician Assistant

## 2024-08-03 ENCOUNTER — Ambulatory Visit: Payer: Self-pay

## 2024-08-03 VITALS — BP 132/70 | HR 89 | Temp 97.5°F | Ht 61.0 in | Wt 262.5 lb

## 2024-08-03 DIAGNOSIS — M62830 Muscle spasm of back: Secondary | ICD-10-CM

## 2024-08-03 DIAGNOSIS — Z6841 Body Mass Index (BMI) 40.0 and over, adult: Secondary | ICD-10-CM

## 2024-08-03 DIAGNOSIS — I10 Essential (primary) hypertension: Secondary | ICD-10-CM

## 2024-08-03 DIAGNOSIS — G8929 Other chronic pain: Secondary | ICD-10-CM

## 2024-08-03 DIAGNOSIS — F332 Major depressive disorder, recurrent severe without psychotic features: Secondary | ICD-10-CM

## 2024-08-03 MED ORDER — EZETIMIBE 10 MG PO TABS: 10.0000 mg | ORAL_TABLET | Freq: Every day | ORAL | 1 refills | Status: AC

## 2024-08-03 MED ORDER — SERTRALINE HCL 100 MG PO TABS: 100.0000 mg | ORAL_TABLET | Freq: Every day | ORAL | 1 refills | Status: AC

## 2024-08-03 MED ORDER — FUROSEMIDE 20 MG PO TABS: 20.0000 mg | ORAL_TABLET | ORAL | 1 refills | Status: AC

## 2024-08-03 MED ORDER — OLMESARTAN-AMLODIPINE-HCTZ 40-10-25 MG PO TABS: 1.0000 | ORAL_TABLET | Freq: Every evening | ORAL | 1 refills | Status: AC

## 2024-08-03 MED ORDER — TIZANIDINE HCL 4 MG PO TABS: 4.0000 mg | ORAL_TABLET | Freq: Four times a day (QID) | ORAL | 0 refills | Status: AC | PRN

## 2024-08-03 MED ORDER — GLUCOSE BLOOD VI STRP: ORAL_STRIP | 12 refills | Status: AC

## 2024-08-03 MED ORDER — DAPAGLIFLOZIN PROPANEDIOL 5 MG PO TABS: 5.0000 mg | ORAL_TABLET | Freq: Every day | ORAL | 1 refills | Status: AC

## 2024-08-03 MED ORDER — ATORVASTATIN CALCIUM 80 MG PO TABS: 80.0000 mg | ORAL_TABLET | Freq: Every day | ORAL | 1 refills | Status: AC

## 2024-08-03 MED ORDER — TRAZODONE HCL 100 MG PO TABS: 100.0000 mg | ORAL_TABLET | Freq: Every day | ORAL | 1 refills | Status: AC

## 2024-08-03 MED ORDER — HYDRALAZINE HCL 50 MG PO TABS: 100.0000 mg | ORAL_TABLET | Freq: Two times a day (BID) | ORAL | 1 refills | Status: DC

## 2024-08-03 MED ORDER — SPIRONOLACTONE 50 MG PO TABS: 50.0000 mg | ORAL_TABLET | Freq: Every day | ORAL | 1 refills | Status: AC

## 2024-08-03 MED ORDER — ACCU-CHEK SOFTCLIX LANCETS MISC: 1 refills | Status: AC

## 2024-08-03 NOTE — Telephone Encounter (Signed)
 Patient seen in office 08/03/24

## 2024-08-03 NOTE — Progress Notes (Signed)
 Established Patient Office Visit  Subjective   Patient ID: Gwendolyn Woodward, female    DOB: 08-15-1966  Age: 58 y.o. MRN: [id number]  Chief Complaint  Patient presents with   Follow-up    Patient is here for a follow up on blood pressure and chest pain    Discussed the use of AI scribe software for clinical note transcription with the patient, who gave verbal consent to proceed.  History of Present Illness Gwendolyn Woodward is a 58 year old female with hypertension who presents with intermittent chest pain.  For the past two weeks she has had sharp, needle-like chest pain mainly at night. Pain is located in the center of her chest. It is absent when standing, not brought on by activity, and not worsened by breathing. She also has intermittent shortness of breath described as feeling tired when breathing, which occurs at different times than the chest pain.  Home blood pressures were about 213/80 initially and more recently about 157/99, with heart rate around 80. She takes her blood pressure medications at night to avoid dizziness and nausea she had when taking them in the morning. She needs refills for all her medications and reports difficulty with her pharmacy arranging pill packs.  She has intermittent headaches that are milder than prior episodes.     Review of Systems  Constitutional:  Negative for activity change, appetite change, fatigue and fever.  Eyes:  Negative for visual disturbance.  Respiratory:  Positive for shortness of breath. Negative for cough.   Cardiovascular:  Positive for chest pain.  Gastrointestinal:  Negative for vomiting.  Neurological:  Positive for headaches. Negative for light-headedness.       Objective:     BP 132/70   Pulse 89   Temp (!) 97.5 F (36.4 C)   Ht 5' 1 (1.549 m)   Wt 262 lb 8 oz (119.1 kg)   SpO2 93%   BMI 49.60 kg/m    Physical Exam Constitutional:      General: She is not in acute distress.    Appearance: Normal  appearance. She is obese. She is not ill-appearing.  HENT:     Head: Normocephalic and atraumatic.     Mouth/Throat:     Mouth: Mucous membranes are moist.     Pharynx: Oropharynx is clear.  Eyes:     Extraocular Movements: Extraocular movements intact.     Conjunctiva/sclera: Conjunctivae normal.  Cardiovascular:     Rate and Rhythm: Normal rate and regular rhythm.     Heart sounds: Normal heart sounds. No murmur heard. Pulmonary:     Effort: Pulmonary effort is normal.     Breath sounds: Normal breath sounds. No wheezing, rhonchi or rales.  Musculoskeletal:     Right lower leg: No edema.     Left lower leg: No edema.  Skin:    General: Skin is warm and dry.  Neurological:     General: No focal deficit present.     Mental Status: She is alert and oriented to person, place, and time.  Psychiatric:        Mood and Affect: Mood normal.        Behavior: Behavior normal.    EKG: normal sinus rhythm, LVH.   No results found for any visits on 08/03/24.  The ASCVD Risk score (Arnett DK, et al., 2019) failed to calculate for the following reasons:   Risk score cannot be calculated because patient has a medical history suggesting prior/existing ASCVD   * -  Cholesterol units were assumed    Assessment & Plan:   Return in about 3 months (around 11/01/2024) for BP.   Uncontrolled hypertension Assessment & Plan: Elevated home blood pressure readings. Medications adjusted to improve adherence and reduce side effects. Personally contacted pharmacy to set up pill packs for increased compliance. Intermittent nocturnal chest pain. Differential includes angina and heartburn. Encouraged daily medication compliance.  - Ordered EKG to assess cardiac rhythm and rule out ischemia. Overall within normal limits, mild LVH.  - Referred to cardiology for further evaluation of chest pain and blood pressure management. - Continue with dietary changes.   Orders: -     EKG 12-Lead -      Spironolactone ; Take 1 tablet (50 mg total) by mouth daily with breakfast.  Dispense: 90 tablet; Refill: 1 -     Ambulatory referral to Cardiology  Chronic right-sided low back pain with right-sided sciatica  Severe episode of recurrent major depressive disorder, without psychotic features (HCC) -     Sertraline  HCl; Take 1 tablet (100 mg total) by mouth at bedtime.  Dispense: 90 tablet; Refill: 1  Muscle spasm of back -     tiZANidine  HCl; Take 1 tablet (4 mg total) by mouth every 6 (six) hours as needed for muscle spasms.  Dispense: 30 tablet; Refill: 0  Body mass index (BMI) 45.0-49.9, adult (HCC)  Other orders -     Accu-Chek Softclix Lancets; Once daily testing dx e11.9  Dispense: 100 each; Refill: 1 -     Atorvastatin  Calcium ; Take 1 tablet (80 mg total) by mouth at bedtime.  Dispense: 90 tablet; Refill: 1 -     Dapagliflozin  Propanediol; Take 1 tablet (5 mg total) by mouth at bedtime.  Dispense: 90 tablet; Refill: 1 -     Ezetimibe ; Take 1 tablet (10 mg total) by mouth at bedtime.  Dispense: 90 tablet; Refill: 1 -     Furosemide ; Take 1 tablet (20 mg total) by mouth every morning.  Dispense: 90 tablet; Refill: 1 -     hydrALAZINE  HCl; Take 2 tablets (100 mg total) by mouth in the morning and at bedtime.  Dispense: 120 tablet; Refill: 1 -     Olmesartan -amLODIPine -HCTZ; Take 1 tablet by mouth at bedtime.  Dispense: 90 tablet; Refill: 1 -     traZODone  HCl; Take 1 tablet (100 mg total) by mouth at bedtime.  Dispense: 90 tablet; Refill: 1 -     Glucose Blood; Use as instructed  Dispense: 350 each; Refill: 12   Isidra Mings, PA-C

## 2024-08-03 NOTE — Telephone Encounter (Signed)
 Called [facility], spoke with Tammy, informed that patient requesting later appt. Only appt remaining is at 2:40 PM; which is outside of 24 hours; unable to schedule.   Would be able to schedule?  Tammy spoke with Grooms and rescheduled for 2:40 PM.  Per Grooms PA, Suppose to have the daughter with her during visit, if still having significant chest pain then go to ED.

## 2024-08-03 NOTE — Assessment & Plan Note (Signed)
 Elevated home blood pressure readings. Medications adjusted to improve adherence and reduce side effects. Personally contacted pharmacy to set up pill packs for increased compliance. Intermittent nocturnal chest pain. Differential includes angina and heartburn. Encouraged daily medication compliance.  - Ordered EKG to assess cardiac rhythm and rule out ischemia. Overall within normal limits, mild LVH.  - Referred to cardiology for further evaluation of chest pain and blood pressure management. - Continue with dietary changes.

## 2024-08-03 NOTE — Telephone Encounter (Signed)
 Copied from CRM #[id number]. Topic: Clinical - Red Word Triage >> Aug 03, 2024  8:26 AM Victoria A wrote: [facility] that prompted transfer to Nurse Triage: Pt calling to reschedule appt for today that was NT scheduled. Patient said maintenance is coming to her appt and she does not want to leave them in her home without her being there. Answer Assessment - Initial Assessment Questions 1. REASON FOR CALL: What is the main reason for your call? or How can I best help you?     Patient calling to reschedule appt for later today.   Called [facility] , spoke with Tammy. Per Grooms PA, patient would need to go to ED if still having significant chest pain and Tammy rescheduled appt to 2:40 PM today.  Patient denies chest pain at this time. Patient reports daughter will not be with her during her visit and she will have transportation to bring her.  Advised patient to call 911 if chest pain occurs/ lasting more than 5 minutes, diff breathing, faint/ severe weakness, or symptoms worsen.  Patient verbalized understanding.  Protocols used: Information Only Call - No Triage-A-AH

## 2024-08-08 ENCOUNTER — Ambulatory Visit (INDEPENDENT_AMBULATORY_CARE_PROVIDER_SITE_OTHER): Admitting: Clinical

## 2024-08-08 DIAGNOSIS — F419 Anxiety disorder, unspecified: Secondary | ICD-10-CM

## 2024-08-08 DIAGNOSIS — F331 Major depressive disorder, recurrent, moderate: Secondary | ICD-10-CM | POA: Diagnosis not present

## 2024-08-08 NOTE — Progress Notes (Signed)
 IN PERSON   I connected with Gwendolyn Woodward on 08/08/2024 at 11:00 AM EST in person and verified that I am speaking with the correct person using two identifiers.  Location: Patient: office Provider: office   I discussed the limitations of evaluation and management by telemedicine and the availability of in person appointments. The patient expressed understanding and agreed to proceed.      THERAPIST PROGRESS NOTE   Session Time: 1:00 PM-1:30 PM   Participation Level: Active   Behavioral Response: CasualAlertAnxious   Type of Therapy: Individual Therapy   Treatment Goals addressed: Coping   Interventions: CBT   Summary: Gwendolyn Woodward is a 58 y.o. female who presents with Recurrent Moderate MDD with Anxiety. The OPT therapist worked with the patient for her scheduled OPT session. The OPT therapist utilized Motivational Interviewing to assist in creating therapeutic repore. The patient in the session was engaged and work in collaboration giving feedback about her triggers and symptoms over the past few weeks. The patient presented in a upbeat mood today for her session. The patient spoke about her her depression and difficulty with fatigue, irritability and impact of the local weather over the past few weeks. The patient spoke about interactions with her family and plans for upcoming Christmas holiday.The OPT therapist utilized Cognitive Behavioral Therapy through cognitive restructuring as well as worked with the patient on coping strategies for the Winter. The patient spoke about her goal of [place] back to the country and having more freedom.   Suicidal/Homicidal: Nowithout intent/plan   Therapist Response: The OPT therapist worked with the patient for the patients scheduled session. The patient was engaged in her session and gave feedback in relation to triggers, symptoms, and behavior responses over the past few weeks. The patient spoke about her interactions with family and plans  for the upcoming Christmas holiday..The OPT therapist worked with the patient utilizing an in session Cognitive Behavioral Therapy exercise. The patient was responsive in the session and verbalized,  I have been doing ok just at home for the most part its been cold , I have got out some for stuff I need and doctors appointments. The OPT therapist worked with the patient overviewing basic care needs including eating, sleeping, exercise, and hygenie. The patient identified her need to focus on herself and realization she cannot work outside of what is in her control. The OPT therapist continued to work with the patient on mindfulness and balance while promoting the patient continuing to follow the directives of her health professionals. The OPT therapist worked with the patient on replacing her negative thoughts  with positive thoughts. The patient spoke about her goal of moving back to the country for more freedom with her pet.The OPT therapist will continue treatment work with the patient in her next scheduled session.   Plan: Return again in 3 weeks   Diagnosis:      Axis I:  Recurrent Moderate MDD with Anxiety                            Axis II: No diagnosis   Collaboration of Care: No additional collaboration of care for this session.    Patient/Guardian was advised Release of Information must be obtained prior to any record release in order to collaborate their care with an outside provider. Patient/Guardian was advised if they have not already done so to contact the registration department to sign all necessary forms in order for us   to release information regarding their care.    Consent: Patient/Guardian gives verbal consent for treatment and assignment of benefits for services provided during this visit. Patient/Guardian expressed understanding and agreed to proceed      I discussed the assessment and treatment plan with the patient. The patient was provided an opportunity to ask questions  and all were answered. The patient agreed with the plan and demonstrated an understanding of the instructions.   The patient was advised to call back or seek an in-person evaluation if the symptoms worsen or if the condition fails to improve as anticipated.   I provided 30  minutes of face-to-face time during this encounter.   Jerel ONEIDA Pepper, LCSW   08/08/2024

## 2024-08-13 ENCOUNTER — Other Ambulatory Visit (INDEPENDENT_AMBULATORY_CARE_PROVIDER_SITE_OTHER): Payer: Self-pay

## 2024-08-13 ENCOUNTER — Other Ambulatory Visit: Payer: Self-pay

## 2024-08-13 DIAGNOSIS — I10 Essential (primary) hypertension: Secondary | ICD-10-CM

## 2024-08-14 ENCOUNTER — Ambulatory Visit: Admitting: Physician Assistant

## 2024-08-30 ENCOUNTER — Encounter (HOSPITAL_BASED_OUTPATIENT_CLINIC_OR_DEPARTMENT_OTHER): Payer: Self-pay | Admitting: Family

## 2024-08-30 ENCOUNTER — Ambulatory Visit (INDEPENDENT_AMBULATORY_CARE_PROVIDER_SITE_OTHER): Admitting: Family

## 2024-08-30 VITALS — BP 166/92 | HR 71 | Ht 61.0 in | Wt 263.0 lb

## 2024-08-30 DIAGNOSIS — I10 Essential (primary) hypertension: Secondary | ICD-10-CM

## 2024-08-30 DIAGNOSIS — R0609 Other forms of dyspnea: Secondary | ICD-10-CM

## 2024-08-30 DIAGNOSIS — Z8673 Personal history of transient ischemic attack (TIA), and cerebral infarction without residual deficits: Secondary | ICD-10-CM

## 2024-08-30 DIAGNOSIS — E782 Mixed hyperlipidemia: Secondary | ICD-10-CM

## 2024-08-30 DIAGNOSIS — I1A0 Resistant hypertension: Secondary | ICD-10-CM

## 2024-08-30 NOTE — Patient Instructions (Addendum)
 Medication Instructions:   Your physician recommends that you continue on your current medications as directed. Please refer to the Current Medication list given to you today.  Please make sure you are taking all your medications on a consistent basis   Labwork:  TODAY--BMET, TSH, RENIN-ALDOSTERONE--[facility] IS ON 3RD FLOOR OF THIS BUILDING [address] PRIMARY CARE    Testing/Procedures:  Your physician has requested that you have an echocardiogram. Echocardiography is a painless test that uses sound waves to create images of your heart. It provides your doctor with information about the size and shape of your heart and how well your hearts chambers and valves are working. This procedure takes approximately one hour. There are no restrictions for this procedure. Please do NOT wear cologne, perfume, aftershave, or lotions (deodorant is allowed). Please arrive 15 minutes prior to your appointment time.  Please note: We ask at that you not bring children with you during ultrasound (echo/ vascular) testing. Due to room size and safety concerns, children are not allowed in the ultrasound rooms during exams. Our front office staff cannot provide observation of children in our lobby area while testing is being conducted. An adult accompanying a patient to their appointment will only be allowed in the ultrasound room at the discretion of the ultrasound technician under special circumstances. We apologize for any inconvenience.   Your physician has requested that you have a renal artery duplex. During this test, an ultrasound is used to evaluate blood flow to the kidneys. Allow one hour for this exam. Do not eat after midnight the day before and avoid carbonated beverages. Take your medications as you usually do.    Follow-Up: Please follow up WITH Kristin Alvstad PharmD ON THURSDAY 09/20/24 AT 8 AM FOR HYPERTENSION FOLLOW-UP--THAT WILL BE IN THIS OFFICE--PLEASE BRING ALL YOUR MEDICATIONS/PILL-PACKS WITH  YOU TO THIS VISIT

## 2024-08-30 NOTE — Progress Notes (Signed)
 "  Advanced Hypertension Clinic Initial Assessment:    Date:  08/30/2024   ID:  Gwendolyn Woodward, DOB 01-17-1966, MRN [id number]  PCP:  Grooms, Charmaine, PA-C  Cardiologist:  None  Nephrologist:  Referring MD: Grooms, [place], PA-C   CC: Hypertension  History of Present Illness:    Gwendolyn Woodward is a 59 y.o. female with a hx of HTN, depression, obesity, HLD, DM2,CVA in 1984 and 1988 here to establish care in the Advanced Hypertension Clinic.   Initial stroke after the birth of her son. She was told she had a blood clot in her brain. She did not have preeclampsia with pregnancy.   Prior stress test 2013 with no ischemia, carotid duplex 2013 no stenosis.2021 CT with normal adrenals. Workup in 2017 by neurology for syncope with cardiac monitor with no arrhythmia.   Gwendolyn Woodward was diagnosed with hypertension after her mother's passing in 2002. She is frustrated understandably by multiple medications, diagnoses. She attributes her diagnoses to going to the doctor's office as did not have medical issues previously. BP has been difficult to control. Blood pressure checked with arm cuff at home which she uses on her forearm with home readings reportedly in the 200s. Her home cuff has not been checked for accuracy. When her blood pressure is high she gets a headache.  she reports tobacco use never. No significant alcohol use. For exercise she does walk, orthopedic issues limit activity. She reports adhering to a healthy diet.   She reports lightheadedness with sensation of needing to sit down occurring about four times per month. She has a history of syncope, prior eval 2017 by neurology. Her last episode of syncope was a year ago.   She notes mid-sternal chest pain which she attributes to acid reflux. The pain occurs at random and often improves with indigestion measures. She does note it improves more often when laying down.   She notes getting out of breath easily ongoing for years.  She attributes this to her weight.   Different to ascertain whether she takes her medications in the morning or evening. Not taking medications at consistent time though does have pill pack.  Concerns about nausea and dizziness with her medications, she is uncertain which medication is causing this.  Previously on ozempic . Does not appear dose was ever up-titrated. Discussed this is likely why she did not lose weight on it previously.   Previous antihypertensives:  Past Medical History:  Diagnosis Date   Arthritis    back pain and knee pain, no meds   Asthma    Chest pain    + dyspnea   CVA (cerebral infarction) 1984 , 1988   x2, left sided weakness, history of dizziness   Depression with anxiety    History- no meds   Dysphagia    Fasting hyperglycemia    Gastroesophageal reflux disease    no meds   Headache(784.0)    OTC med PRN   History of anemia    S/p transfusions in 1999 at [facility] after vaginal bleeding; normal CBC and 09/2011   Hyperlipidemia    lipid profile in 11/2011: 203, 106, 58, 124   Hypertension    Lab 09/2011: Normal CMet except glucose of 109-169 and albumin-3.4; normal BP 09/2011-12/2011   Sleep apnea    Does not use CPAP - no longer has CPAP machine   Stroke [facility])     Past Surgical History:  Procedure Laterality Date   CESAREAN SECTION  1984, 1987  x 2   COLONOSCOPY  01/27/2012   Procedure: COLONOSCOPY;  Surgeon: Margo LITTIE Haddock, MD;  Location: [facility] ENDO SUITE;  Service: Endoscopy;  Laterality: N/A;  8:30   ENDOMETRIAL ABLATION  Feb 2013   ESOPHAGOGASTRODUODENOSCOPY  5/11   [facility]-Dr Durand GERD distal esophagus, NEGATIVE bx for Barretts   EXTERNAL FIXATION LEG Right 04/19/2020   Procedure: EXTERNAL FIXATION RIGHT ANKLE;  Surgeon: Vernetta Lonni GRADE, MD;  Location: [facility] OR;  Service: Orthopedics;  Laterality: Right;   IUD REMOVAL  10/20/2011   Procedure: INTRAUTERINE DEVICE (IUD) REMOVAL;  Surgeon: Lynwood FORBES Curlene PONCE, MD;  Location: [facility] ORS;   Service: Gynecology;  Laterality: N/A;   OPEN REDUCTION INTERNAL FIXATION (ORIF) TIBIA/FIBULA FRACTURE Right 04/23/2020   Procedure: OPEN REDUCTION INTERNAL FIXATION (ORIF) TIBIA/FIBULA FRACTURE;  Surgeon: Kendal Franky SQUIBB, MD;  Location: [facility] OR;  Service: Orthopedics;  Laterality: Right;   TUBAL LIGATION     WISDOM TOOTH EXTRACTION      Current Medications: Active Medications[1]   Allergies:   Aspirin and Metformin  and related   Social History   Socioeconomic History   Marital status: Divorced    Spouse name: Not on file   Number of children: 2   Years of education: Not on file   Highest education level: Not on file  Occupational History   Occupation: sock boarder    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  Tobacco Use   Smoking status: Never    Passive exposure: Current (and in the past as well)   Smokeless tobacco: Never  Vaping Use   Vaping status: Never Used  Substance and Sexual Activity   Alcohol use: Yes    Comment: Socially, 2 times per yr   Drug use: No   Sexual activity: Not Currently    Birth control/protection: Surgical    Comment: tubal & ablation  Other Topics Concern   Not on file  Social History Narrative   Lives alone-2 grown children   Social Drivers of Health   Tobacco Use: Medium Risk (08/30/2024)   Patient History    Smoking Tobacco Use: Never    Smokeless Tobacco Use: Never    Passive Exposure: Current  Financial Resource Strain: Low Risk (01/19/2023)   Overall Financial Resource Strain (CARDIA)    Difficulty of Paying Living Expenses: Not hard at all  Food Insecurity: No Food Insecurity (08/30/2024)   Epic    Worried About Programme Researcher, Broadcasting/film/video in the Last Year: Never true    Ran Out of Food in the Last Year: Never true  Transportation Needs: Unmet Transportation Needs (08/30/2024)   Epic    Lack of Transportation (Medical): Yes    Lack of Transportation (Non-Medical): Yes  Physical Activity: Insufficiently Active (08/30/2024)   Exercise Vital  Sign    Days of Exercise per Week: 3 days    Minutes of Exercise per Session: 10 min  Stress: No Stress Concern Present (01/19/2023)   [employer] of Occupational Health - Occupational Stress Questionnaire    Feeling of Stress : Not at all  Social Connections: Socially Isolated (08/30/2024)   Social Connection and Isolation Panel    Frequency of Communication with Friends and Family: Once a week    Frequency of Social Gatherings with Friends and Family: Never    Attends Religious Services: More than 4 times per year    Active Member of [employer] or Organizations: No    Attends Banker Meetings: Never    Marital  Status: Divorced  Depression (PHQ2-9): High Risk (08/03/2024)   Depression (PHQ2-9)    PHQ-2 Score: 19  Alcohol Screen: Low Risk (08/30/2024)   Alcohol Screen    Last Alcohol Screening Score (AUDIT): 1  Housing: Low Risk (01/19/2023)   Housing    Last Housing Risk Score: 0  Utilities: Not At Risk (01/19/2023)   AHC Utilities    Threatened with loss of utilities: No  Health Literacy: Inadequate Health Literacy (08/30/2024)   B1300 Health Literacy    Frequency of need for help with medical instructions: Often     Family History: The patient's family history includes Asthma in her mother; Cancer in her father; Diabetes in her maternal uncle; Heart disease in her father; Hypertension in her mother. There is no history of Anesthesia problems.  ROS:   Please see the history of present illness.     All other systems reviewed and are negative.  EKGs/Labs/Other Studies Reviewed:         Recent Labs: 01/09/2024: ALT 19; Hemoglobin 12.8; Platelets 200 04/24/2024: BUN 23; Creatinine, Ser 0.99; Potassium 4.5; Sodium 144   Recent Lipid Panel    Component Value Date/Time   CHOL 202 (H) 01/09/2024 0931   TRIG 172 (H) 01/09/2024 0931   HDL 67 01/09/2024 0931   CHOLHDL 3.0 01/09/2024 0931   CHOLHDL 2.6 12/06/2016 1441   VLDL 19 12/06/2016 1441   LDLCALC 106 (H)  01/09/2024 0931    Physical Exam:   VS:  Ht 5' 1 (1.549 m)   Wt 263 lb (119.3 kg)   BMI 49.69 kg/m  , BMI Body mass index is 49.69 kg/m. GENERAL:  Well appearing, overweight HEENT: Pupils equal round and reactive, fundi not visualized, oral mucosa unremarkable NECK:  No jugular venous distention, waveform within normal limits, carotid upstroke brisk and symmetric, no bruits, no thyromegaly LYMPHATICS:  No cervical adenopathy LUNGS:  Clear to auscultation bilaterally HEART:  RRR.  PMI not displaced or sustained,S1 and S2 within normal limits, no S3, no S4, no clicks, no rubs, no murmurs ABD:  Flat, positive bowel sounds normal in frequency in pitch, no bruits, no rebound, no guarding, no midline pulsatile mass, no hepatomegaly, no splenomegaly EXT:  2 plus pulses throughout, non pitting pretibial edema, no cyanosis no clubbing SKIN:  No rashes no nodules NEURO:  Cranial nerves II through XII grossly intact, motor grossly intact throughout PSYCH:  Cognitively intact, oriented to person place and time   ASSESSMENT/PLAN:    HTN - BP not at goal <130/80. Nonadherence, poor understanding of medical regimen contributory. She is frustrated by multiple agents and I anticipate her regimen has been gradually increased by providers as her blood pressure was high but she was not routinely taking previously prescribed agents. She presently has meds in pill pack (not available at visit) but takes at different times of day. Reports feeling nauseous and dizzy after medications. Her BP cuff at home is upper arm cuff used on her forearm with readings in the 200s, suspect inaccurate.  Encouraged to medications on consistent schedule. Visit in 2-3 weeks with PharmD to bring pill pack and review medications. As unclear what she is taking, will not make medication changes today. Sooner visit unable to be scheduled due to her transportation needs.  Bring BP cuff to follow up visit to assess accuracy Detailed  list of her medications and what they were for was provided for her (see under 'letters' tab) Labs today: BMP, renin-aldosterone (of note, on Spironolactone  - if  abnormal may need to hold and recollect), TSH Plan for renal artery duplex to rule out stenosis  DOE - etiology deconditioning vs obesity vs cardiac. Plan for echocardiogram.   Chest pain - consistent with GERD, atypical for angina. No ischemic evaluation recommended. TWI in lead III noted but dates back to 2018 and stable. If wall motion abnormalities on echo, could pursue ischemic eval.  DM2 - Continue to follow with PCP. Appreciate inclusion of SGLT2i. Previously on Ozempic  though reports stopped as was not losing weight, it does not appear dose was ever escalated by her previous providers.  At this time her PCP recommended focusing on BP as her last A1c was 6.4 which is a more than reasonable plan.  HLD - on atorvastatin  80mg  daily and zetia  10mg  daily. Pending what she is taking on medication review in a few weeks, could consider consolidate to solely Atorvastatin  with adherence and repeat lipid panel in 2-3 months.   Screening for Secondary Hypertension:     Relevant Labs/Studies:    Latest Ref Rng & Units 04/24/2024    9:42 AM 01/09/2024    9:31 AM 07/26/2023   10:11 AM  Basic Labs  Sodium 134 - 144 mmol/L 144  145  144   Potassium 3.5 - 5.2 mmol/L 4.5  4.6  3.9   Creatinine 0.57 - 1.00 mg/dL 9.00  9.06  8.98        Latest Ref Rng & Units 07/26/2023   10:11 AM 04/18/2023   10:15 AM  Thyroid    TSH 0.450 - 4.500 uIU/mL 1.350  0.793                   Disposition:    FU with MD/APP/PharmD in 2-3 weeks    Medication Adjustments/Labs and Tests Ordered: Current medicines are reviewed at length with the patient today.  Concerns regarding medicines are outlined above.  Orders Placed This Encounter  Procedures   EKG 12-Lead   No orders of the defined types were placed in this encounter.    Signed, Reche GORMAN Finder, NP  08/30/2024 10:39 AM    [facility]     [1]  Current Meds  Medication Sig   Accu-Chek Softclix Lancets lancets Once daily testing dx e11.9   atorvastatin  (LIPITOR) 80 MG tablet Take 1 tablet (80 mg total) by mouth at bedtime.   dapagliflozin  propanediol (FARXIGA ) 5 MG TABS tablet Take 1 tablet (5 mg total) by mouth at bedtime.   ezetimibe  (ZETIA ) 10 MG tablet Take 1 tablet (10 mg total) by mouth at bedtime.   furosemide  (LASIX ) 20 MG tablet Take 1 tablet (20 mg total) by mouth every morning.   glucose blood test strip Use as instructed   hydrALAZINE  (APRESOLINE ) 50 MG tablet Take 2 tablets (100 mg total) by mouth in the morning and at bedtime.   HYDROcodone -acetaminophen  (NORCO) 10-325 MG tablet Take 1 tablet by mouth 4 (four) times daily.   Multiple Vitamin (MULTIVITAMIN) tablet Take 1 tablet by mouth daily.   Olmesartan -amLODIPine -HCTZ 40-10-25 MG TABS Take 1 tablet by mouth at bedtime.   sertraline  (ZOLOFT ) 100 MG tablet Take 1 tablet (100 mg total) by mouth at bedtime.   spironolactone  (ALDACTONE ) 50 MG tablet Take 1 tablet (50 mg total) by mouth daily with breakfast.   tiZANidine  (ZANAFLEX ) 4 MG tablet Take 1 tablet (4 mg total) by mouth every 6 (six) hours as needed for muscle spasms.   traZODone  (DESYREL ) 100 MG tablet Take 1  tablet (100 mg total) by mouth at bedtime.   "

## 2024-09-04 LAB — ALDOSTERONE + RENIN ACTIVITY W/ RATIO
Aldos/Renin Ratio: 26.6 — ABNORMAL HIGH (ref 0.0–20.0)
Aldosterone: 13.6 ng/dL (ref 0.0–30.0)
Renin Activity, Plasma: 0.511 ng/mL/h (ref 0.167–5.380)

## 2024-09-04 LAB — TSH: TSH: 0.956 u[IU]/mL (ref 0.450–4.500)

## 2024-09-04 LAB — BASIC METABOLIC PANEL WITH GFR
BUN/Creatinine Ratio: 17 (ref 12–28)
BUN: 17 mg/dL (ref 8–27)
CO2: 27 mmol/L (ref 20–29)
Calcium: 9.2 mg/dL (ref 8.7–10.3)
Chloride: 103 mmol/L (ref 96–106)
Creatinine, Ser: 1.03 mg/dL — ABNORMAL HIGH (ref 0.57–1.00)
Glucose: 122 mg/dL — ABNORMAL HIGH (ref 70–99)
Potassium: 4.7 mmol/L (ref 3.5–5.2)
Sodium: 143 mmol/L (ref 134–144)
eGFR: 62 mL/min/1.73

## 2024-09-05 ENCOUNTER — Ambulatory Visit (HOSPITAL_BASED_OUTPATIENT_CLINIC_OR_DEPARTMENT_OTHER): Payer: Self-pay | Admitting: Family

## 2024-09-05 DIAGNOSIS — Z79899 Other long term (current) drug therapy: Secondary | ICD-10-CM

## 2024-09-05 DIAGNOSIS — I1A0 Resistant hypertension: Secondary | ICD-10-CM

## 2024-09-07 NOTE — Telephone Encounter (Signed)
-----   Message from Reche Finder, NP sent at 09/05/2024  9:04 AM EST ----- Normal thyroid , electrolytes. Creatinine mildly elevated, ensure staying well hydrated. Labs indeterminate for hyperaldosteronism. Given mildly abnormal, will hold Spironolactone  and repeat.  On 09/19/23 please have her stop Spironolactone . She will have repeat plasma renin-aldosterone ratio on 09/21/23 after her visit with Kristin, PharmD.  She requires transportation to appointments so trying to avoid multiple trips for her.  Kristin - just FYI. If you will confirm she held Spironolactone  before she goes for labs 1/29.

## 2024-09-07 NOTE — Telephone Encounter (Signed)
 The patients daughter Gwendolyn Woodward ([place] and helps pt manage all medical care) has been notified of the result and verbalized understanding.  All questions (if any) were answered.  Gwendolyn Woodward to have the pt STOP spironolactone  on 09/18/24 and see Gwendolyn Woodward [facility] as planned at our office on 09/20/24, where repeat labs will be done same time.  Daughter is aware Gwendolyn Woodward [facility] will have pt repeat labs at that visit, given labs were indeterminate for hyperaldosteronism. Informed the pts daughter this will also help the pt avoid multiple trips to our office, given she requires transportation.  Repeat plasma renin-aldosterone ratio order placed (for 1/29) and released in the system.   Gwendolyn Woodward ask that when Gwendolyn Woodward [facility] see's pt on 1/29, to help in contacting pts pharmacy for pill pack assistance going forward.  Daughter states this will ensure pt is taking her meds correctly going forth.  Daughter is aware I will route this message to Gwendolyn Woodward [facility] to make her aware of this.   Gwendolyn Woodward verbalized understanding and agrees with this plan.

## 2024-09-20 ENCOUNTER — Encounter (HOSPITAL_BASED_OUTPATIENT_CLINIC_OR_DEPARTMENT_OTHER): Payer: Self-pay | Admitting: Pharmacist Clinician (PhC)/ Clinical Pharmacy Specialist

## 2024-09-20 ENCOUNTER — Ambulatory Visit (HOSPITAL_COMMUNITY): Admitting: Clinical

## 2024-09-20 ENCOUNTER — Ambulatory Visit (HOSPITAL_BASED_OUTPATIENT_CLINIC_OR_DEPARTMENT_OTHER): Admitting: Pharmacist Clinician (PhC)/ Clinical Pharmacy Specialist

## 2024-09-20 VITALS — BP 180/116 | HR 78

## 2024-09-20 DIAGNOSIS — I10 Essential (primary) hypertension: Secondary | ICD-10-CM

## 2024-09-20 NOTE — Patient Instructions (Signed)
 Follow up appointment: Monday Feb 9 at 1 pm  Go to the lab today to check blood for causes of high blood pressure  Take your BP meds as follows: Take the medications in the 7 day pill minder as directed in office  Bring this with you to your next appointment.  Check your blood pressure at home daily and keep record of the readings.  Bring your home cuff and book with the readings when you come back  Your blood pressure goal is <150/90 to start, we will have lower goals as we get it moving down  To check your pressure at home you will need to:  1. Sit up in a chair, with feet flat on the floor and back supported. Do not cross your ankles or legs. 2. Rest your left arm so that the cuff is about heart level. If the cuff goes on your upper arm,  then just relax the arm on the table, arm of the chair or your lap. If you have a wrist cuff, we  suggest relaxing your wrist against your chest (think of it as Pledging the Flag with the  wrong arm).  3. Place the cuff snugly around your arm, about 1 inch above the crook of your elbow. The  cords should be inside the groove of your elbow.  4. Sit quietly, with the cuff in place, for about 5 minutes. After that 5 minutes press the power  button to start a reading. 5. Do not talk or move while the reading is taking place.  6. Record your readings on a sheet of paper. Although most cuffs have a memory, it is often  easier to see a pattern developing when the numbers are all in front of you.  7. You can repeat the reading after 1-3 minutes if it is recommended  Make sure your bladder is empty and you have not had caffeine  or tobacco within the last 30 min  Always bring your blood pressure log with you to your appointments. If you have not brought your monitor in to be double checked for accuracy, please bring it to your next appointment.  You can find a list of quality blood pressure cuffs at [email or web address]  Important lifestyle changes to control  high blood pressure  Intervention  Effect on the BP  Lose extra pounds and watch your waistline Weight loss is one of the most effective lifestyle changes for controlling blood pressure. If you're overweight or obese, losing even a small amount of weight can help reduce blood pressure. Blood pressure might go down by about 1 millimeter of mercury (mm Hg) with each kilogram (about 2.2 pounds) of weight lost.  Exercise regularly As a general goal, aim for at least 30 minutes of moderate physical activity every day. Regular physical activity can lower high blood pressure by about 5 to 8 mm Hg.  Eat a healthy diet Eating a diet rich in whole grains, fruits, vegetables, and low-fat dairy products and low in saturated fat and cholesterol. A healthy diet can lower high blood pressure by up to 11 mm Hg.  Reduce salt (sodium) in your diet Even a small reduction of sodium in the diet can improve heart health and reduce high blood pressure by about 5 to 6 mm Hg.  Limit alcohol One drink equals 12 ounces of beer, 5 ounces of wine, or 1.5 ounces of 80-proof liquor.  Limiting alcohol to less than one drink a day for women or two drinks a  day for men can help lower blood pressure by about 4 mm Hg.   If you have any questions or concerns please use My Chart to send questions or call the office at [phone]

## 2024-09-20 NOTE — Assessment & Plan Note (Signed)
 Assessment: BP is uncontrolled in office BP 180/116 mmHg;  above the goal (<130/80). Has not had any medications for 12+ days Complains of dizziness/nausea if she takes AM pill pack meds Denies SOB, palpitation.  Occasional chest pain she attributes to GERD Currently has 2 months of pill pack cards (10 cards) as well as bottles of most meds - pill pack cards last filled 09/08/24 Reiterated the importance of healthy diet   Plan:  Gave two 7 day pill minders (AM/PM).  Added meds as follows AM:  olmesartan /amlodipine /hctz 40/10/25, hydralazine  25 mg, ezetimibe  10 mg, furosemide  20 mg PM:  atorvastatin  80 mg, hydralazine  25 mg, sertraline  100 mg, Farxiga  5 mg Stressed importance of taking what we put in the minders so that we can best determine what she really needs - suspect some meds may have been added 2/2 unknown non-compliance Gave Hypertension handbook and reviewed where to add home BP readings Patient to keep record of BP readings with heart rate and report to us  at the next visit - and bring home BP device Patient to follow up with me in 10 days  Labs ordered today:  will get follow up aldosterone/renin labs, since off spironolactone  for at least 12 days Patient left with 2 weeks of meds in the pill minders.  We kept the 10 cards of pill-pack as well as most of her medication bottles.  She will likely need to have medications re-arranged from those cards, so will destroy those downstairs in the pharmacy.  She will be due for refill in around 10 days, so we can determine new pill pack structure at that time.   She took with her prescription bottles for hydrocodone /apap, naproxen , tizanidine  and trazodone  - she understands that these are prn medications and their use is written on bottles with sharpie pen.

## 2024-09-20 NOTE — Progress Notes (Signed)
 "  Office Visit    Patient Name: Gwendolyn Woodward Date of Encounter: 09/20/2024  Primary Care Provider:  Grooms, [place], [place] Primary Cardiologist:  None  Chief Complaint    Hypertension - [facility] hypertension clinic  Past Medical History   HLD 5/25 LDL 106; on atorvastatin  80, ezetimibe  10  DM2 5/25 A1c 6.4, on Farxiga  5  CVA 1984 after birth of her son (no preeclampsia); 1988;   obesity Was on Ozempic  without success, never up-titrated dose  OSA No CPAP    Allergies[1]  History of Present Illness    Gwendolyn Woodward is a 59 y.o. female patient who was referred to the [facility] by Monticello Grooms PA.  She was seen on January 8 by Reche Finder NP, with a BP of 166/92.  There was concern for non-adherence of medication, in part due to frustration over the need for multiple meds.  Secondary review was conducted, which showed indeterminate for hyperaldosteronism.  She was on spironolactone  at the time, and has been asked to hold this so that labs could be repeated.  TSH was normal and she is scheduled for renal dopplers in 2 weeks.  Home BP readings tend to be in the 200's systolic, (on forearm) device has not been checked for accuracy.    Today she is in the office for follow up.  She was told to stop spironolactone  on 1/27 in order to repeat aldosterone labs.  Unfortunately by the time the message went through her daughter to her, there was a misunderstanding, and she stopped all of her medications.  She has not had anything since probably the 16th or 17th.  She did bring all her medications today, and compliance has obviously been a concern.  She has some mostly full bottles dating back to July 2025 as well as 60 days worth of medications on pill-pack cards.  The cards are mostly untouched, with only  2 or 3 opened.  Those were not on the same cards - she admits she would just grab a card and take the meds.    She complains that her morning medications have been  making her dizzy and nauseated, so she only took them occasionally.  Noted frustration because she didn't know what each pill in the packs was for, and because the pharmacy keeps sending more cards.   So if she felt her BP was elevated, she'd only want to take the BP pill, but as she didn't know which it was, she apparently just didn't take anything.    Blood Pressure Goal:  130/80  Current Medications: (hydralazine  100 mg bid, olmesartan /amlodipine /hctz 40/10/25 mg daily, spironolactone  50 mg daily)  Diet:   does eat out some, at home more chicken (boiled or fried)   Exercise: none, uses walker for mobility  Home BP readings: no readings with her but states at home routinely over 200 systolic.      Accessory Clinical Findings    Lab Results  Component Value Date   CREATININE 1.03 (H) 08/30/2024   BUN 17 08/30/2024   NA 143 08/30/2024   K 4.7 08/30/2024   CL 103 08/30/2024   CO2 27 08/30/2024   Lab Results  Component Value Date   ALT 19 01/09/2024   AST 14 01/09/2024   GGT CANCELED 05/05/2022   ALKPHOS 155 (H) 01/09/2024   BILITOT <0.2 01/09/2024   Lab Results  Component Value Date   HGBA1C 6.4 (H) 01/09/2024    Screening for Secondary Hypertension:  Relevant Labs/Studies:    Latest Ref Rng & Units 08/30/2024    1:05 PM 04/24/2024    9:42 AM 01/09/2024    9:31 AM  Basic Labs  Sodium 134 - 144 mmol/L 143  144  145   Potassium 3.5 - 5.2 mmol/L 4.7  4.5  4.6   Creatinine 0.57 - 1.00 mg/dL 8.96  9.00  9.06        Latest Ref Rng & Units 08/30/2024    1:05 PM 07/26/2023   10:11 AM  Thyroid    TSH 0.450 - 4.500 uIU/mL 0.956  1.350        Latest Ref Rng & Units 08/30/2024    1:05 PM  Renin/Aldosterone   Aldosterone 0.0 - 30.0 ng/dL 86.3   Aldos/Renin Ratio 0.0 - 20.0 26.6              08/30/2024   11:22 AM  Renovascular   Renal Artery US  Completed Yes      Home Medications    Current Outpatient Medications  Medication Sig Dispense Refill   atorvastatin   (LIPITOR) 80 MG tablet Take 1 tablet (80 mg total) by mouth at bedtime. 90 tablet 1   dapagliflozin  propanediol (FARXIGA ) 5 MG TABS tablet Take 1 tablet (5 mg total) by mouth at bedtime. 90 tablet 1   ezetimibe  (ZETIA ) 10 MG tablet Take 1 tablet (10 mg total) by mouth at bedtime. 90 tablet 1   furosemide  (LASIX ) 20 MG tablet Take 1 tablet (20 mg total) by mouth every morning. 90 tablet 1   hydrALAZINE  (APRESOLINE ) 50 MG tablet Take 2 tablets (100 mg total) by mouth in the morning and at bedtime. 120 tablet 1   Olmesartan -amLODIPine -HCTZ 40-10-25 MG TABS Take 1 tablet by mouth at bedtime. 90 tablet 1   sertraline  (ZOLOFT ) 100 MG tablet Take 1 tablet (100 mg total) by mouth at bedtime. 90 tablet 1   spironolactone  (ALDACTONE ) 50 MG tablet Take 1 tablet (50 mg total) by mouth daily with breakfast. 90 tablet 1   traZODone  (DESYREL ) 100 MG tablet Take 1 tablet (100 mg total) by mouth at bedtime. 90 tablet 1   Accu-Chek Softclix Lancets lancets Once daily testing dx e11.9 100 each 1   glucose blood test strip Use as instructed 350 each 12   HYDROcodone -acetaminophen  (NORCO) 10-325 MG tablet Take 1 tablet by mouth 4 (four) times daily.     Multiple Vitamin (MULTIVITAMIN) tablet Take 1 tablet by mouth daily.     tiZANidine  (ZANAFLEX ) 4 MG tablet Take 1 tablet (4 mg total) by mouth every 6 (six) hours as needed for muscle spasms. 30 tablet 0   No current facility-administered medications for this visit.     Assessment & Plan     Uncontrolled hypertension Assessment: BP is uncontrolled in office BP 180/116 mmHg;  above the goal (<130/80). Has not had any medications for 12+ days Complains of dizziness/nausea if she takes AM pill pack meds Denies SOB, palpitation.  Occasional chest pain she attributes to GERD Currently has 2 months of pill pack cards (10 cards) as well as bottles of most meds - pill pack cards last filled 09/08/24 Reiterated the importance of healthy diet   Plan:  Gave two 7 day  pill minders (AM/PM).  Added meds as follows AM:  olmesartan /amlodipine /hctz 40/10/25, hydralazine  25 mg, ezetimibe  10 mg, furosemide  20 mg PM:  atorvastatin  80 mg, hydralazine  25 mg, sertraline  100 mg, Farxiga  5 mg Stressed importance of taking what we put in the minders  so that we can best determine what she really needs - suspect some meds may have been added 2/2 unknown non-compliance Gave Hypertension handbook and reviewed where to add home BP readings Patient to keep record of BP readings with heart rate and report to us  at the next visit - and bring home BP device Patient to follow up with me in 10 days  Labs ordered today:  will get follow up aldosterone/renin labs, since off spironolactone  for at least 12 days Patient left with 2 weeks of meds in the pill minders.  We kept the 10 cards of pill-pack as well as most of her medication bottles.  She will likely need to have medications re-arranged from those cards, so will destroy those downstairs in the pharmacy.  She will be due for refill in around 10 days, so we can determine new pill pack structure at that time.   She took with her prescription bottles for hydrocodone /apap, naproxen , tizanidine  and trazodone  - she understands that these are prn medications and their use is written on bottles with sharpie pen.   Charniece Venturino PharmD CPP [facility]  [address]  [place], [place] [address] [phone]     [1]  Allergies Allergen Reactions   Aspirin     INTERNAL BLEEDING   Metformin  And Related Nausea Only   "

## 2024-09-24 ENCOUNTER — Other Ambulatory Visit: Payer: Self-pay | Admitting: Physician Assistant

## 2024-09-26 LAB — ALDOSTERONE + RENIN ACTIVITY W/ RATIO
Aldos/Renin Ratio: 32.1 — ABNORMAL HIGH (ref 0.0–20.0)
Aldosterone: 9.5 ng/dL (ref 0.0–30.0)
Renin Activity, Plasma: 0.296 ng/mL/h (ref 0.167–5.380)

## 2024-10-01 ENCOUNTER — Ambulatory Visit (HOSPITAL_BASED_OUTPATIENT_CLINIC_OR_DEPARTMENT_OTHER): Admitting: Pharmacist Clinician (PhC)/ Clinical Pharmacy Specialist

## 2024-10-01 ENCOUNTER — Ambulatory Visit: Admitting: Pharmacist Clinician (PhC)/ Clinical Pharmacy Specialist

## 2024-10-04 ENCOUNTER — Ambulatory Visit

## 2024-10-05 ENCOUNTER — Other Ambulatory Visit (HOSPITAL_BASED_OUTPATIENT_CLINIC_OR_DEPARTMENT_OTHER)

## 2024-10-05 ENCOUNTER — Encounter (HOSPITAL_BASED_OUTPATIENT_CLINIC_OR_DEPARTMENT_OTHER)

## 2024-11-01 ENCOUNTER — Ambulatory Visit: Admitting: Physician Assistant
